# Patient Record
Sex: Female | Born: 1965 | Race: Black or African American | Hispanic: No | Marital: Single | Smoking: Former smoker
Health system: Southern US, Community
[De-identification: ages and names within clinical notes are randomized; demographics above are authoritative.]

## PROBLEM LIST (undated history)

## (undated) DIAGNOSIS — F329 Major depressive disorder, single episode, unspecified: Secondary | ICD-10-CM

## (undated) DIAGNOSIS — M1990 Unspecified osteoarthritis, unspecified site: Secondary | ICD-10-CM

## (undated) DIAGNOSIS — IMO0002 Reserved for concepts with insufficient information to code with codable children: Secondary | ICD-10-CM

## (undated) DIAGNOSIS — F429 Obsessive-compulsive disorder, unspecified: Secondary | ICD-10-CM

## (undated) DIAGNOSIS — R002 Palpitations: Secondary | ICD-10-CM

## (undated) DIAGNOSIS — M797 Fibromyalgia: Secondary | ICD-10-CM

## (undated) DIAGNOSIS — M329 Systemic lupus erythematosus, unspecified: Secondary | ICD-10-CM

## (undated) DIAGNOSIS — I10 Essential (primary) hypertension: Secondary | ICD-10-CM

## (undated) DIAGNOSIS — J189 Pneumonia, unspecified organism: Secondary | ICD-10-CM

## (undated) DIAGNOSIS — G35 Multiple sclerosis: Secondary | ICD-10-CM

## (undated) DIAGNOSIS — F32A Depression, unspecified: Secondary | ICD-10-CM

## (undated) DIAGNOSIS — G4733 Obstructive sleep apnea (adult) (pediatric): Secondary | ICD-10-CM

## (undated) HISTORY — PX: ABDOMINAL SURGERY: SHX537

## (undated) HISTORY — PX: JOINT REPLACEMENT: SHX530

## (undated) HISTORY — PX: CARPAL TUNNEL RELEASE: SHX101

## (undated) HISTORY — PX: HERNIA REPAIR: SHX51

## (undated) HISTORY — PX: BREAST EXCISIONAL BIOPSY: SUR124

---

## 2015-11-11 ENCOUNTER — Emergency Department (HOSPITAL_COMMUNITY): Payer: Medicare Other

## 2015-11-11 ENCOUNTER — Encounter (HOSPITAL_COMMUNITY): Payer: Self-pay | Admitting: Emergency Medicine

## 2015-11-11 ENCOUNTER — Emergency Department (HOSPITAL_COMMUNITY)
Admission: EM | Admit: 2015-11-11 | Discharge: 2015-11-11 | Discharge status: Against Medical Advice | Payer: Medicare Other | Attending: Emergency Medicine | Admitting: Emergency Medicine

## 2015-11-11 DIAGNOSIS — R079 Chest pain, unspecified: Secondary | ICD-10-CM | POA: Diagnosis present

## 2015-11-11 DIAGNOSIS — R29810 Facial weakness: Secondary | ICD-10-CM | POA: Insufficient documentation

## 2015-11-11 DIAGNOSIS — F17200 Nicotine dependence, unspecified, uncomplicated: Secondary | ICD-10-CM | POA: Diagnosis not present

## 2015-11-11 HISTORY — DX: Multiple sclerosis: G35

## 2015-11-11 HISTORY — DX: Unspecified osteoarthritis, unspecified site: M19.90

## 2015-11-11 HISTORY — DX: Fibromyalgia: M79.7

## 2015-11-11 LAB — BASIC METABOLIC PANEL
Anion gap: 8 (ref 5–15)
BUN: 12 mg/dL (ref 6–20)
CO2: 29 mmol/L (ref 22–32)
Calcium: 9 mg/dL (ref 8.9–10.3)
Chloride: 106 mmol/L (ref 101–111)
Creatinine, Ser: 0.85 mg/dL (ref 0.44–1.00)
GFR calc Af Amer: >60 mL/min (ref >60)
GFR calc non Af Amer: >60 mL/min (ref >60)
Glucose, Bld: 117 mg/dL — ABNORMAL HIGH (ref 65–99)
Potassium: 3.5 mmol/L (ref 3.5–5.1)
Sodium: 143 mmol/L (ref 135–145)

## 2015-11-11 LAB — CBC
HCT: 43.5 % (ref 36.0–46.0)
Hemoglobin: 14.3 g/dL (ref 12.0–15.0)
MCH: 29.7 pg (ref 26.0–34.0)
MCHC: 32.9 g/dL (ref 30.0–36.0)
MCV: 90.2 fL (ref 78.0–100.0)
Platelets: 216 10*3/uL (ref 150–400)
RBC: 4.82 MIL/uL (ref 3.87–5.11)
RDW: 14.2 % (ref 11.5–15.5)
WBC: 10.1 10*3/uL (ref 4.0–10.5)

## 2015-11-11 LAB — I-STAT TROPONIN, ED: Troponin i, poc: 0 ng/mL (ref 0.00–0.08)

## 2015-11-11 NOTE — ED Notes (Addendum)
Called twice no answer  MM

## 2015-11-11 NOTE — ED Notes (Signed)
Pt c/o chest pain, N/V, back pain, and L side facial numbness x 4 days. Pt A&Ox4. Pt has equal strength bilaterally in arms and legs. No facial droop noted. Face symmetrical. Pt walked into triage room. Pt c/o blurred vision in her L eye.

## 2015-11-11 NOTE — ED Notes (Signed)
Patient not answering in waiting room. 

## 2016-06-26 ENCOUNTER — Ambulatory Visit (HOSPITAL_BASED_OUTPATIENT_CLINIC_OR_DEPARTMENT_OTHER): Payer: Medicare Other | Attending: Cardiology | Admitting: Internal Medicine

## 2016-06-26 VITALS — Ht 61.0 in | Wt 266.0 lb

## 2016-06-26 DIAGNOSIS — G4719 Other hypersomnia: Secondary | ICD-10-CM | POA: Diagnosis not present

## 2016-06-26 DIAGNOSIS — G4730 Sleep apnea, unspecified: Secondary | ICD-10-CM

## 2016-06-26 DIAGNOSIS — R0683 Snoring: Secondary | ICD-10-CM | POA: Insufficient documentation

## 2016-06-26 DIAGNOSIS — G4726 Circadian rhythm sleep disorder, shift work type: Secondary | ICD-10-CM | POA: Insufficient documentation

## 2016-06-26 DIAGNOSIS — G4710 Hypersomnia, unspecified: Secondary | ICD-10-CM

## 2016-06-26 DIAGNOSIS — G4733 Obstructive sleep apnea (adult) (pediatric): Secondary | ICD-10-CM | POA: Diagnosis not present

## 2016-07-01 ENCOUNTER — Encounter: Payer: 59 | Admitting: Podiatry

## 2016-07-04 DIAGNOSIS — R0683 Snoring: Secondary | ICD-10-CM | POA: Diagnosis not present

## 2016-07-04 DIAGNOSIS — G4730 Sleep apnea, unspecified: Secondary | ICD-10-CM | POA: Diagnosis not present

## 2016-07-04 DIAGNOSIS — G4710 Hypersomnia, unspecified: Secondary | ICD-10-CM | POA: Diagnosis not present

## 2016-07-04 NOTE — Procedures (Signed)
   Patient Name: Megan Dixon, Megan Dixon Date: 06/26/2016 Gender: Female D.O.B: Jan 02, 1966 Age (years): 50 Referring Provider: Awanda Mink Spruill Height (inches): 61 Interpreting Physician: Baird Lyons MD, ABSM Weight (lbs): 266 RPSGT: Joni Reining BMI: 50 MRN: [id number] Neck Size: 15.50 CLINICAL INFORMATION Sleep Study Type: NPSG Indication for sleep study: Snoring Epworth Sleepiness Score: 4  SLEEP STUDY TECHNIQUE As per the AASM Manual for the Scoring of Sleep and Associated Events v2.3 (April 2016) with a hypopnea requiring 4% desaturations. The channels recorded and monitored were frontal, central and occipital EEG, electrooculogram (EOG), submentalis EMG (chin), nasal and oral airflow, thoracic and abdominal wall motion, anterior tibialis EMG, snore microphone, electrocardiogram, and pulse oximetry.  MEDICATIONS Patient's medications include: charted for review. Medications self-administered by patient during sleep study : No sleep medicine administered.  SLEEP ARCHITECTURE The study was initiated at 10:59:51 PM and ended at 5:14:50 AM. Sleep onset time was 15.2 minutes and the sleep efficiency was 88.9%. The total sleep time was 333.5 minutes. Stage REM latency was 84.0 minutes. The patient spent 2.10% of the night in stage N1 sleep, 65.82% in stage N2 sleep, 3.75% in stage N3 and 28.34% in REM. Alpha intrusion was absent. Supine sleep was 39.58%.  RESPIRATORY PARAMETERS The overall apnea/hypopnea index (AHI) was 36.2 per hour. There were 44 total apneas, including 20 obstructive, 24 central and 0 mixed apneas. There were 157 hypopneas and 8 RERAs. The AHI during Stage REM sleep was 68.6 per hour. AHI while supine was 31.4 per hour. The mean oxygen saturation was 93.85%. The minimum SpO2 during sleep was 81.00%. Moderate snoring was noted during this study.  CARDIAC DATA The 2 lead EKG demonstrated sinus rhythm. The mean heart rate was 84.06 beats per minute.  Other EKG findings include: None.  LEG MOVEMENT DATA The total PLMS were 0 with a resulting PLMS index of 0.00. Associated arousal with leg movement index was 0.0 .  IMPRESSIONS - Severe obstructive sleep apnea occurred during this study (AHI = 36.2/h). - No significant central sleep apnea occurred during this study (CAI = 4.3/h). - Mild oxygen desaturation was noted during this study (Min O2 = 81.00%). - The patient snored with Moderate snoring volume. - No cardiac abnormalities were noted during this study. - Clinically significant periodic limb movements did not occur during sleep. No significant associated arousals.  DIAGNOSIS - Obstructive Sleep Apnea (327.23 [G47.33 ICD-10]) - Nocturnal Hypoxemia (327.26 [G47.36 ICD-10])  RECOMMENDATIONS - Therapeutic CPAP titration to determine optimal pressure required to alleviate sleep disordered breathing. - Avoid alcohol, sedatives and other CNS depressants that may worsen sleep apnea and disrupt normal sleep architecture. - Sleep hygiene should be reviewed to assess factors that may improve sleep quality. - Weight management and regular exercise should be initiated or continued if appropriate.  [Electronically signed] 07/04/2016 11:49 AM  Baird Lyons MD, ABSM Diplomate, American Board of Sleep Medicine   NPI: [id number] [place], American Board of Sleep Medicine  ELECTRONICALLY SIGNED ON:  07/04/2016, 11:46 AM [place] PH: [phone]   FX: [phone] [place]

## 2016-07-20 ENCOUNTER — Ambulatory Visit: Payer: Medicaid Other | Admitting: Podiatry

## 2016-08-05 ENCOUNTER — Encounter (HOSPITAL_COMMUNITY): Payer: Self-pay | Admitting: Emergency Medicine

## 2016-08-05 ENCOUNTER — Emergency Department (HOSPITAL_COMMUNITY): Payer: No Typology Code available for payment source

## 2016-08-05 ENCOUNTER — Emergency Department (HOSPITAL_COMMUNITY)
Admission: EM | Admit: 2016-08-05 | Discharge: 2016-08-05 | Disposition: A | Discharge status: Home / Self Care | Payer: No Typology Code available for payment source | Attending: Emergency Medicine | Admitting: Emergency Medicine

## 2016-08-05 DIAGNOSIS — Y999 Unspecified external cause status: Secondary | ICD-10-CM | POA: Diagnosis not present

## 2016-08-05 DIAGNOSIS — F17200 Nicotine dependence, unspecified, uncomplicated: Secondary | ICD-10-CM | POA: Insufficient documentation

## 2016-08-05 DIAGNOSIS — Y92410 Unspecified street and highway as the place of occurrence of the external cause: Secondary | ICD-10-CM | POA: Diagnosis not present

## 2016-08-05 DIAGNOSIS — Y939 Activity, unspecified: Secondary | ICD-10-CM | POA: Diagnosis not present

## 2016-08-05 DIAGNOSIS — S42401A Unspecified fracture of lower end of right humerus, initial encounter for closed fracture: Secondary | ICD-10-CM

## 2016-08-05 DIAGNOSIS — S52044A Nondisplaced fracture of coronoid process of right ulna, initial encounter for closed fracture: Secondary | ICD-10-CM | POA: Diagnosis not present

## 2016-08-05 DIAGNOSIS — S59901A Unspecified injury of right elbow, initial encounter: Secondary | ICD-10-CM | POA: Diagnosis present

## 2016-08-05 MED ORDER — HYDROMORPHONE HCL 1 MG/ML IJ SOLN
1.0000 mg | Freq: Once | INTRAMUSCULAR | Status: AC
  Administered 2016-08-05: 1 mg via INTRAVENOUS
  Filled 2016-08-05: qty 1

## 2016-08-05 MED ORDER — OXYCODONE-ACETAMINOPHEN 5-325 MG PO TABS: 2.0000 | ORAL_TABLET | ORAL | 0 refills | Status: DC | PRN

## 2016-08-05 MED ORDER — OXYCODONE-ACETAMINOPHEN 5-325 MG PO TABS
2.0000 | ORAL_TABLET | Freq: Once | ORAL | Status: AC
  Administered 2016-08-05: 2 via ORAL
  Filled 2016-08-05: qty 2
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
## 2016-08-05 NOTE — Progress Notes (Signed)
Medicaid [place] access response hx indicates the assigned pcp is [facility], [place] [id number] [phone]
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
## 2016-08-05 NOTE — ED Notes (Signed)
Due to pt's hand swelling, ring had to be cut off.  Given to her children to hold on to.
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
## 2016-08-05 NOTE — ED Notes (Signed)
Patient transported to X-ray 

## 2016-08-05 NOTE — ED Notes (Signed)
Bed: [facility]PT1 Expected date:  Expected time:  Means of arrival:  Comments: 
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
## 2016-08-13 NOTE — Progress Notes (Signed)
This encounter was created in error - please disregard.

## 2016-10-25 ENCOUNTER — Encounter (HOSPITAL_COMMUNITY): Payer: Self-pay | Admitting: Emergency Medicine

## 2016-10-25 ENCOUNTER — Emergency Department (HOSPITAL_COMMUNITY)
Admission: EM | Admit: 2016-10-25 | Discharge: 2016-10-25 | Disposition: A | Discharge status: Home / Self Care | Payer: Medicare Other | Attending: Emergency Medicine | Admitting: Emergency Medicine

## 2016-10-25 DIAGNOSIS — M545 Low back pain: Secondary | ICD-10-CM | POA: Diagnosis present

## 2016-10-25 DIAGNOSIS — M5442 Lumbago with sciatica, left side: Secondary | ICD-10-CM | POA: Diagnosis not present

## 2016-10-25 DIAGNOSIS — F17200 Nicotine dependence, unspecified, uncomplicated: Secondary | ICD-10-CM | POA: Insufficient documentation

## 2016-10-25 DIAGNOSIS — G8929 Other chronic pain: Secondary | ICD-10-CM

## 2016-10-25 DIAGNOSIS — J45909 Unspecified asthma, uncomplicated: Secondary | ICD-10-CM | POA: Insufficient documentation

## 2016-10-25 DIAGNOSIS — M5441 Lumbago with sciatica, right side: Secondary | ICD-10-CM | POA: Insufficient documentation

## 2016-10-25 MED ORDER — CYCLOBENZAPRINE HCL 10 MG PO TABS: 10.0000 mg | ORAL_TABLET | Freq: Two times a day (BID) | ORAL | 0 refills | Status: DC | PRN

## 2016-10-25 MED ORDER — PREDNISONE 10 MG (21) PO TBPK: 10.0000 mg | ORAL_TABLET | Freq: Every day | ORAL | 0 refills | Status: DC

## 2016-10-25 MED ORDER — HYDROCODONE-ACETAMINOPHEN 5-325 MG PO TABS
1.0000 | ORAL_TABLET | Freq: Once | ORAL | Status: AC
  Administered 2016-10-25: 1 via ORAL
  Filled 2016-10-25: qty 1

## 2016-10-25 MED ORDER — METHOCARBAMOL 500 MG PO TABS
1000.0000 mg | ORAL_TABLET | Freq: Once | ORAL | Status: AC
  Administered 2016-10-25: 1000 mg via ORAL
  Filled 2016-10-25: qty 2

## 2016-10-25 MED ORDER — PREDNISONE 20 MG PO TABS
60.0000 mg | ORAL_TABLET | Freq: Once | ORAL | Status: AC
  Administered 2016-10-25: 60 mg via ORAL
  Filled 2016-10-25: qty 3

## 2016-10-25 MED ORDER — NAPROXEN 500 MG PO TABS: 500.0000 mg | ORAL_TABLET | Freq: Two times a day (BID) | ORAL | 0 refills | Status: DC

## 2016-10-25 MED ORDER — LIDOCAINE 5 % EX PTCH: 1.0000 | MEDICATED_PATCH | CUTANEOUS | 0 refills | Status: DC

## 2016-10-25 MED ORDER — HYDROCODONE-ACETAMINOPHEN 5-325 MG PO TABS: 1.0000 | ORAL_TABLET | Freq: Four times a day (QID) | ORAL | 0 refills | Status: DC | PRN

## 2016-10-25 NOTE — Discharge Instructions (Signed)
Take it easy, but do not lay around too much as this may make the stiffness worse. Take 500 mg of naproxen every 12 hours or 800 mg of ibuprofen every 8 hours for the next 3 days. Take these medications with food to avoid upset stomach. Flexeril is a muscle relaxer and may help loosen stiff muscles. Do not take the Flexeril while driving or performing other dangerous activities. Be sure to perform the attached exercises starting with three times a week and working up to performing them daily. This is an essential part of preventing long term problems. Follow up with a primary care provider or your neurologist for any future management of these complaints.

## 2016-10-25 NOTE — ED Triage Notes (Signed)
Patient here from home with complaints of chronic low back pain. Hx of MS and fibromyalgia. Pain 10/10. Ambulatory. Gabapentin and ibuprofen with no relief.

## 2016-10-25 NOTE — ED Notes (Signed)
Signature pad not working. 

## 2016-10-25 NOTE — ED Provider Notes (Signed)
[facility] DEPT Provider Note   CSN: [id number] Arrival date & time: 10/25/16  [id number]     History   Chief Complaint Chief Complaint  Patient presents with  . Back Pain    HPI Megan Dixon is a 50 y.o. female.  HPI   Megan Dixon is a 50 y.o. female, with a history of Fibromyalgia, arthritis, and MS, presenting to the ED with acute on chronic lower back pain for the past three days. Pain is cramping/tight, severe, radiating down her legs bilaterally. Pt has taken ibuprofen without complete relief. Percocet and Dilaudid has previously helped patient with her pain. Denies neuro deficits, changes in bowel or bladder function, fever/chills, N/V, or any other complaints.     Patient has been seen previously by [facility] neurology.   Past Medical History:  Diagnosis Date  . Arthritis   . Fibromyalgia   . MS (multiple sclerosis) ([place])     There are no active problems to display for this patient.   Past Surgical History:  Procedure Laterality Date  . ABDOMINAL SURGERY    . JOINT REPLACEMENT      OB History    No data available       Home Medications    Prior to Admission medications   Medication Sig Start Date End Date Taking? Authorizing Provider  amLODipine-valsartan (EXFORGE) 5-320 MG tablet Take 1 tablet by mouth daily.   Yes Historical Provider, MD  FLUoxetine (PROZAC) 20 MG capsule Take 60 mg by mouth daily.   Yes Historical Provider, MD  gabapentin (NEURONTIN) 600 MG tablet Take 600 mg by mouth 2 (two) times daily.   Yes Historical Provider, MD  ibuprofen (ADVIL,MOTRIN) 200 MG tablet Take 400 mg by mouth every 6 (six) hours as needed for moderate pain.   Yes Historical Provider, MD  lidocaine (XYLOCAINE) 5 % ointment Apply 1 application topically 2 (two) times daily as needed for mild pain or moderate pain.   Yes Historical Provider, MD  pantoprazole (PROTONIX) 40 MG tablet Take 40 mg by mouth daily.   Yes Historical Provider, MD  cyclobenzaprine  (FLEXERIL) 10 MG tablet Take 1 tablet (10 mg total) by mouth 2 (two) times daily as needed for muscle spasms. 10/25/16   Canton Yearby C Paulla Mcclaskey, PA-C  HYDROcodone-acetaminophen (NORCO/VICODIN) 5-325 MG tablet Take 1-2 tablets by mouth every 6 (six) hours as needed. 10/25/16   Laurene Melendrez C Ayrton Mcvay, PA-C  lidocaine (LIDODERM) 5 % Place 1 patch onto the skin daily. Remove & Discard patch within 12 hours or as directed by MD 10/25/16   Helane Gunther Ethaniel Garfield, PA-C  naproxen (NAPROSYN) 500 MG tablet Take 1 tablet (500 mg total) by mouth 2 (two) times daily. 10/25/16   Bailey Kolbe C Rachel Rison, PA-C  oxyCODONE-acetaminophen (PERCOCET) 5-325 MG tablet Take 2 tablets by mouth every 4 (four) hours as needed. Patient not taking: Reported on 10/25/2016 08/05/16   Charlesetta Shanks, MD  predniSONE (STERAPRED UNI-PAK 21 TAB) 10 MG (21) TBPK tablet Take 1 tablet (10 mg total) by mouth daily. Take 6 tabs day 1, 5 tabs day 2, 4 tabs day 3, 3 tabs day 4, 2 tabs day 5, and 1 tab on day 6. 10/25/16   Darlys Buis C Monty Spicher, PA-C    Family History No family history on file.  Social History Social History  Substance Use Topics  . Smoking status: Current Every Day Smoker  . Smokeless tobacco: Never Used  . Alcohol use Yes     Allergies   Morphine and related and Toradol [  ketorolac tromethamine]   Review of Systems Review of Systems  Constitutional: Negative for chills and fever.  Respiratory: Negative for shortness of breath.   Cardiovascular: Negative for chest pain.  Gastrointestinal: Negative for abdominal pain, nausea and vomiting.  Musculoskeletal: Positive for back pain. Negative for neck pain.  Neurological: Negative for dizziness, weakness, light-headedness, numbness and headaches.  All other systems reviewed and are negative.    Physical Exam Updated Vital Signs BP 161/99 (BP Location: Right Arm)   Pulse 98   Temp 97.8 F (36.6 C) (Oral)   Resp 18   SpO2 99%   Physical Exam  Constitutional: She appears well-developed and well-nourished. No  distress.  HENT:  Head: Normocephalic and atraumatic.  Eyes: Conjunctivae and EOM are normal. Pupils are equal, round, and reactive to light.  Neck: Normal range of motion. Neck supple.  Cardiovascular: Normal rate, regular rhythm, normal heart sounds and intact distal pulses.   Pulmonary/Chest: Effort normal and breath sounds normal. No respiratory distress.  Abdominal: Soft. There is no tenderness. There is no guarding.  Musculoskeletal: She exhibits tenderness. She exhibits no edema.  Tenderness to the bilateral lumbar musculature extending into the bilateral gluteal regions. Normal motor function intact in all extremities and spine. No midline spinal tenderness.   Lymphadenopathy:    She has no cervical adenopathy.  Neurological: She is alert.  No sensory deficits. Strength 5/5 in all extremities. No gait disturbance. Coordination intact. Cranial nerves III-XII grossly intact.   Skin: Skin is warm and dry. Capillary refill takes less than 2 seconds. She is not diaphoretic.  Psychiatric: She has a normal mood and affect. Her behavior is normal.  Nursing note and vitals reviewed.    ED Treatments / Results  Labs (all labs ordered are listed, but only abnormal results are displayed) Labs Reviewed - No data to display  EKG  EKG Interpretation None       Radiology No results found.  Procedures Procedures (including critical care time)  Medications Ordered in ED Medications  HYDROcodone-acetaminophen (NORCO/VICODIN) 5-325 MG per tablet 1 tablet (1 tablet Oral Given 10/25/16 0841)  methocarbamol (ROBAXIN) tablet 1,000 mg (1,000 mg Oral Given 10/25/16 0841)  predniSONE (DELTASONE) tablet 60 mg (60 mg Oral Given 10/25/16 0841)     Initial Impression / Assessment and Plan / ED Course  I have reviewed the triage vital signs and the nursing notes.  Pertinent labs & imaging results that were available during my care of the patient were reviewed by me and considered in my  medical decision making (see chart for details).  Clinical Course     Patient presents with acute on chronic lower back pain. No neuro or functional deficits. No red flag symptoms. No indication for emergent imaging at this time. PCP versus neurology follow-up. The patient was given instructions for home care as well as return precautions. Patient voices understanding of these instructions, accepts the plan, and is comfortable with discharge.   Final Clinical Impressions(s) / ED Diagnoses   Final diagnoses:  Chronic bilateral low back pain with bilateral sciatica    New Prescriptions Discharge Medication List as of 10/25/2016  9:09 AM    START taking these medications   Details  cyclobenzaprine (FLEXERIL) 10 MG tablet Take 1 tablet (10 mg total) by mouth 2 (two) times daily as needed for muscle spasms., Starting Sun 10/25/2016, Print    HYDROcodone-acetaminophen (NORCO/VICODIN) 5-325 MG tablet Take 1-2 tablets by mouth every 6 (six) hours as needed., Starting Sun 10/25/2016, Print  lidocaine (LIDODERM) 5 % Place 1 patch onto the skin daily. Remove & Discard patch within 12 hours or as directed by MD, Starting Sun 10/25/2016, Print    naproxen (NAPROSYN) 500 MG tablet Take 1 tablet (500 mg total) by mouth 2 (two) times daily., Starting Sun 10/25/2016, Print    predniSONE (STERAPRED UNI-PAK 21 TAB) 10 MG (21) TBPK tablet Take 1 tablet (10 mg total) by mouth daily. Take 6 tabs day 1, 5 tabs day 2, 4 tabs day 3, 3 tabs day 4, 2 tabs day 5, and 1 tab on day 6., Starting Sun 10/25/2016, Print         Lorayne Bender, PA-C 10/25/16 1636    Tanna Furry, MD 11/11/16 1038

## 2017-02-04 ENCOUNTER — Emergency Department (HOSPITAL_COMMUNITY)
Admission: EM | Admit: 2017-02-04 | Discharge: 2017-02-04 | Disposition: A | Discharge status: Home / Self Care | Payer: Medicare Other | Attending: Emergency Medicine | Admitting: Emergency Medicine

## 2017-02-04 ENCOUNTER — Encounter (HOSPITAL_COMMUNITY): Payer: Self-pay | Admitting: Emergency Medicine

## 2017-02-04 ENCOUNTER — Emergency Department (HOSPITAL_COMMUNITY): Payer: Medicare Other

## 2017-02-04 DIAGNOSIS — K5641 Fecal impaction: Secondary | ICD-10-CM

## 2017-02-04 DIAGNOSIS — F17200 Nicotine dependence, unspecified, uncomplicated: Secondary | ICD-10-CM | POA: Insufficient documentation

## 2017-02-04 DIAGNOSIS — R109 Unspecified abdominal pain: Secondary | ICD-10-CM | POA: Diagnosis not present

## 2017-02-04 DIAGNOSIS — K5900 Constipation, unspecified: Secondary | ICD-10-CM | POA: Diagnosis not present

## 2017-02-04 DIAGNOSIS — R1011 Right upper quadrant pain: Secondary | ICD-10-CM

## 2017-02-04 DIAGNOSIS — R1084 Generalized abdominal pain: Secondary | ICD-10-CM | POA: Diagnosis present

## 2017-02-04 LAB — COMPREHENSIVE METABOLIC PANEL
ALT: 16 U/L (ref 14–54)
AST: 17 U/L (ref 15–41)
Albumin: 3.9 g/dL (ref 3.5–5.0)
Alkaline Phosphatase: 98 U/L (ref 38–126)
Anion gap: 9 (ref 5–15)
BUN: 14 mg/dL (ref 6–20)
CO2: 27 mmol/L (ref 22–32)
Calcium: 9.1 mg/dL (ref 8.9–10.3)
Chloride: 105 mmol/L (ref 101–111)
Creatinine, Ser: 0.8 mg/dL (ref 0.44–1.00)
GFR calc Af Amer: >60 mL/min (ref >60)
GFR calc non Af Amer: >60 mL/min (ref >60)
Glucose, Bld: 98 mg/dL (ref 65–99)
Potassium: 3.7 mmol/L (ref 3.5–5.1)
Sodium: 141 mmol/L (ref 135–145)
Total Bilirubin: 0.2 mg/dL — ABNORMAL LOW (ref 0.3–1.2)
Total Protein: 7.5 g/dL (ref 6.5–8.1)

## 2017-02-04 LAB — PREGNANCY, URINE: Preg Test, Ur: NEGATIVE

## 2017-02-04 LAB — CBC
HCT: 40.5 % (ref 36.0–46.0)
Hemoglobin: 13.5 g/dL (ref 12.0–15.0)
MCH: 29 pg (ref 26.0–34.0)
MCHC: 33.3 g/dL (ref 30.0–36.0)
MCV: 87.1 fL (ref 78.0–100.0)
Platelets: 229 10*3/uL (ref 150–400)
RBC: 4.65 MIL/uL (ref 3.87–5.11)
RDW: 14.7 % (ref 11.5–15.5)
WBC: 8.9 10*3/uL (ref 4.0–10.5)

## 2017-02-04 LAB — URINALYSIS, ROUTINE W REFLEX MICROSCOPIC
Bilirubin Urine: NEGATIVE
Glucose, UA: NEGATIVE mg/dL
Hgb urine dipstick: NEGATIVE
Ketones, ur: NEGATIVE mg/dL
Leukocytes, UA: NEGATIVE
Nitrite: NEGATIVE
Protein, ur: NEGATIVE mg/dL
Specific Gravity, Urine: 1.013 (ref 1.005–1.030)
pH: 7 (ref 5.0–8.0)

## 2017-02-04 LAB — LIPASE, BLOOD: Lipase: 24 U/L (ref 11–51)

## 2017-02-04 MED ORDER — GI COCKTAIL ~~LOC~~
30.0000 mL | Freq: Once | ORAL | Status: AC
  Administered 2017-02-04: 30 mL via ORAL
  Filled 2017-02-04: qty 30

## 2017-02-04 MED ORDER — POLYETHYLENE GLYCOL 3350 17 G PO PACK: PACK | ORAL | 0 refills | Status: DC

## 2017-02-04 MED ORDER — ONDANSETRON HCL 4 MG/2ML IJ SOLN
4.0000 mg | Freq: Once | INTRAMUSCULAR | Status: AC | PRN
  Administered 2017-02-04: 4 mg via INTRAVENOUS
  Filled 2017-02-04: qty 2

## 2017-02-04 MED ORDER — DICYCLOMINE HCL 20 MG PO TABS: 20.0000 mg | ORAL_TABLET | Freq: Three times a day (TID) | ORAL | 0 refills | Status: DC

## 2017-02-04 MED ORDER — ONDANSETRON HCL 4 MG/2ML IJ SOLN
4.0000 mg | Freq: Once | INTRAMUSCULAR | Status: AC
  Administered 2017-02-04: 4 mg via INTRAVENOUS
  Filled 2017-02-04: qty 2

## 2017-02-04 MED ORDER — HYDROCODONE-ACETAMINOPHEN 5-325 MG PO TABS
2.0000 | ORAL_TABLET | Freq: Once | ORAL | Status: AC
  Administered 2017-02-04: 2 via ORAL
  Filled 2017-02-04: qty 2

## 2017-02-04 MED ORDER — SODIUM CHLORIDE 0.9 % IJ SOLN
INTRAMUSCULAR | Status: AC
  Filled 2017-02-04: qty 50

## 2017-02-04 MED ORDER — HYDROMORPHONE HCL 1 MG/ML IJ SOLN
1.0000 mg | Freq: Once | INTRAMUSCULAR | Status: AC
  Administered 2017-02-04: 1 mg via INTRAVENOUS
  Filled 2017-02-04: qty 1

## 2017-02-04 MED ORDER — IOPAMIDOL (ISOVUE-300) INJECTION 61%
INTRAVENOUS | Status: AC
  Administered 2017-02-04: 100 mL
  Filled 2017-02-04: qty 100

## 2017-02-04 MED ORDER — ONDANSETRON HCL 4 MG PO TABS: 4.0000 mg | ORAL_TABLET | Freq: Three times a day (TID) | ORAL | 0 refills | Status: DC | PRN

## 2017-02-04 MED ORDER — SODIUM CHLORIDE 0.9 % IV BOLUS (SEPSIS)
1000.0000 mL | Freq: Once | INTRAVENOUS | Status: AC
  Administered 2017-02-04: 1000 mL via INTRAVENOUS

## 2017-02-04 NOTE — ED Provider Notes (Signed)
[facility] DEPT Provider Note   CSN: [id number] Arrival date & time: 02/04/17  1214     History   Chief Complaint Chief Complaint  Patient presents with  . Abdominal Pain  . Emesis    HPI Megan Dixon is a 51 y.o. female.  HPI   51 year old female with past medical history as above who presents with generalized abdominal pain. The patient's symptoms started approximately a week ago with right upper quadrant pain. Since then, she has had cramping, aching, right upper quadrant pain. The pain is worse with eating. She has had some small bowel movements but not her usual amount over the same time period. No fever or chills. She has no history of similar symptoms. Denies any alleviating factors.  Past Medical History:  Diagnosis Date  . Arthritis   . Fibromyalgia   . MS (multiple sclerosis) ([place])     There are no active problems to display for this patient.   Past Surgical History:  Procedure Laterality Date  . ABDOMINAL SURGERY    . HERNIA REPAIR    . JOINT REPLACEMENT      OB History    No data available       Home Medications    Prior to Admission medications   Medication Sig Start Date End Date Taking? Authorizing Provider  amLODipine-valsartan (EXFORGE) 5-320 MG tablet Take 1 tablet by mouth daily.   Yes Historical Provider, MD  FLUoxetine (PROZAC) 20 MG capsule Take 60 mg by mouth daily.   Yes Historical Provider, MD  gabapentin (NEURONTIN) 600 MG tablet Take 600 mg by mouth 2 (two) times daily.   Yes Historical Provider, MD  pantoprazole (PROTONIX) 40 MG tablet Take 40 mg by mouth daily.   Yes Historical Provider, MD  dicyclomine (BENTYL) 20 MG tablet Take 1 tablet (20 mg total) by mouth 4 (four) times daily -  before meals and at bedtime. 02/04/17 02/14/17  Duffy Bruce, MD  ondansetron (ZOFRAN) 4 MG tablet Take 1 tablet (4 mg total) by mouth every 8 (eight) hours as needed for nausea or vomiting. 02/04/17   Duffy Bruce, MD  polyethylene glycol  [facility]) packet Mix 8 cap fulls in 32 ounces of juice/water/gatorade and drink over the course of a day. Once your stool is loose or liquid, stop taking. 02/04/17   Duffy Bruce, MD    Family History No family history on file.  Social History Social History  Substance Use Topics  . Smoking status: Current Every Day Smoker  . Smokeless tobacco: Never Used  . Alcohol use Yes     Allergies   Morphine and related and Toradol [ketorolac tromethamine]   Review of Systems Review of Systems  Constitutional: Positive for fatigue.  Gastrointestinal: Positive for abdominal pain, constipation, nausea and vomiting.  Neurological: Positive for weakness (generalized).  All other systems reviewed and are negative.    Physical Exam Updated Vital Signs BP 158/96 (BP Location: Left Arm)   Pulse 74   Temp 98.3 F (36.8 C) (Oral)   Resp 18   Ht 5\' 1"  (1.549 m)   Wt 240 lb (108.9 kg)   SpO2 94%   BMI 45.35 kg/m   Physical Exam  Constitutional: She is oriented to person, place, and time. She appears well-developed and well-nourished. No distress.  HENT:  Head: Normocephalic and atraumatic.  Eyes: Conjunctivae are normal.  Neck: Neck supple.  Cardiovascular: Normal rate, regular rhythm and normal heart sounds.  Exam reveals no friction rub.   No murmur  heard. Pulmonary/Chest: Effort normal and breath sounds normal. No respiratory distress. She has no wheezes. She has no rales.  Abdominal: Normal appearance. She exhibits no distension. There is tenderness in the right upper quadrant and epigastric area. There is no rigidity, no rebound, no guarding and negative Murphy's sign.  Musculoskeletal: She exhibits no edema.  Neurological: She is alert and oriented to person, place, and time. She exhibits normal muscle tone.  Skin: Skin is warm. Capillary refill takes less than 2 seconds.  Psychiatric: She has a normal mood and affect.  Nursing note and vitals reviewed.    ED Treatments /  Results  Labs (all labs ordered are listed, but only abnormal results are displayed) Labs Reviewed  COMPREHENSIVE METABOLIC PANEL - Abnormal; Notable for the following:       Result Value   Total Bilirubin 0.2 (*)    All other components within normal limits  LIPASE, BLOOD  CBC  URINALYSIS, ROUTINE W REFLEX MICROSCOPIC  PREGNANCY, URINE    EKG  EKG Interpretation None       Radiology Ct Abdomen Pelvis W Contrast  Result Date: 02/04/2017 CLINICAL DATA:  Right abdominal pain for 2 weeks, history of diverticulitis and hernia repair EXAM: CT ABDOMEN AND PELVIS WITH CONTRAST TECHNIQUE: Multidetector CT imaging of the abdomen and pelvis was performed using the standard protocol following bolus administration of intravenous contrast. CONTRAST:  15mL ISOVUE-300 IOPAMIDOL (ISOVUE-300) INJECTION 61% COMPARISON:  None. FINDINGS: Lower chest: Lung bases are unremarkable.  Small hiatal hernia. Hepatobiliary: Enhanced liver shows no focal mass. No calcified gallstones are noted within gallbladder. Pancreas: Enhanced pancreas without focal abnormality. No peripancreatic inflammation. Spleen: Enhanced spleen with normal appearance. Small accessory splenule is noted. Adrenals/Urinary Tract: There is probable adenoma left adrenal gland measures 1.2 cm. Right adrenal gland is unremarkable. Enhanced kidneys are symmetrical in size. No hydronephrosis or hydroureter. There is a tiny cyst in midpole of the left kidney posterior aspect measures 8 mm. Delayed renal images shows bilateral renal symmetrical excretion. Bilateral visualized proximal ureter is unremarkable. Stomach/Bowel: There is no small bowel obstruction. No gastric outlet obstruction. Suboptimal exam without oral contrast material. No thickened or dilated small bowel loops are noted. Abundant stool noted within cecum. No pericecal inflammation. The terminal ileum is unremarkable. There is normal appendix clearly visualized in coronal image 80.  Moderate stool are noted within mild redundant transverse colon. Scattered diverticula are noted descending colon. No evidence of acute diverticulitis. Multiple colonic diverticula are noted within proximal sigmoid colon. No evidence of acute diverticulitis. The sigmoid colon is redundant looping in right lower quadrant adjacent to the cecum. There is mild focal dilatation of the sigmoid colon adjacent to the cecum up to 4 cm with fecal material axial image 56. Mild fecal impaction cannot be excluded. Beyond this point the distal sigmoid colon small caliber and rectum is collapsed. Vascular/Lymphatic: No aortic aneurysm. Atherosclerotic calcifications of abdominal aorta and iliac arteries are noted. No adenopathy. Reproductive: The uterus is anteflexed normal size. No adnexal mass. The ovaries are unremarkable. Other: There is a midline upper abdominal wall small ventral hernia just anterior to left hepatic lobe containing omental fat measures 3.2 by 1.7 cm. No evidence of acute complication. This is best visualized in sagittal image 121. There is some abdominal wall scarring just inferior to this hernia. There is a second midline anterior abdominal wall supraumbilical hernia containing omental fat best seen in sagittal image 115. Measures at least 9.5 cm cranial caudally. Measures 8 cm transverse by  mentioned by 5 cm AP diameter. No definite evidence of acute inflammation however there is some stranding of the anterior omental fat in anterior abdomen/omentum axial image 39. Mild inflammation/omental edema cannot be excluded. Musculoskeletal: No destructive bony lesions are noted. Sagittal images of the spine shows degenerative changes thoracic spine. Next item there is a tiny umbilical hernia containing omental fat without evidence of acute complication. IMPRESSION: 1. There is no evidence of small bowel obstruction. 2. No hydronephrosis or hydroureter. 3. Moderate stool are noted within right colon and cecum. No  pericecal inflammation. Terminal ileum is unremarkable. Normal appendix clearly visualize in axial image 48. 4. Colonic diverticula are noted in descending colon and proximal sigmoid colon. No definite evidence of acute colitis or diverticulitis.The sigmoid colon is redundant looping in right lower quadrant adjacent to the cecum. There is mild focal dilatation of the sigmoid colon adjacent to the cecum up to 4 cm with fecal material axial image 56. Mild fecal impaction cannot be excluded. Beyond this point the distal sigmoid colon small caliber and rectum is collapsed. 5. There is a midline upper anterior abdominal wall ventral hernia measures 3.2 x 1.7 cm containing omental fat without evidence of acute complication. There is a second supraumbilical hernia containing omental fat midline anterior abdominal wall measures 9.5 cm cranial caudally by 8 cm x 5 cm. No definite evidence of acute inflammation within hernia. However there is mild stranding of the anterior omental fat at the base of the hernia anterior abdomen. Mild inflammation/edema cannot be excluded. Clinical correlation is necessary please see axial image 39. 6. Anteflexed normal size uterus.  No adnexal mass. 7. Degenerative changes thoracic spine. Electronically Signed   By: Lahoma Crocker M.D.   On: 02/04/2017 15:36   US Abdomen Limited Ruq  Result Date: 02/04/2017 CLINICAL DATA:  One week of right upper quadrant pain associated with nausea and vomiting. EXAM: US ABDOMEN LIMITED - RIGHT UPPER QUADRANT COMPARISON:  None in PACs FINDINGS: Gallbladder: No gallstones or wall thickening visualized. No sonographic Murphy sign noted by sonographer. Common bile duct: Diameter: 3.3 mm Liver: No focal lesion identified. Within normal limits in parenchymal echogenicity. IMPRESSION: No gallstones or sonographic evidence of acute cholecystitis. If there are clinical concerns of chronic cholecystitis, a nuclear medicine hepatobiliary scan with gallbladder ejection  fraction determination may be useful. Normal appearance of the liver and common bile duct. Electronically Signed   By: David  Martinique M.D.   On: 02/04/2017 14:18    Procedures Procedures (including critical care time)  Medications Ordered in ED Medications  sodium chloride 0.9 % injection (not administered)  ondansetron (ZOFRAN) injection 4 mg (4 mg Intravenous Given 02/04/17 1641)  HYDROmorphone (DILAUDID) injection 1 mg (1 mg Intravenous Given 02/04/17 1348)  ondansetron (ZOFRAN) injection 4 mg (4 mg Intravenous Given 02/04/17 1348)  sodium chloride 0.9 % bolus 1,000 mL (0 mLs Intravenous Stopped 02/04/17 1515)  gi cocktail (Maalox,Lidocaine,Donnatal) (30 mLs Oral Given 02/04/17 1514)  iopamidol (ISOVUE-300) 61 % injection (100 mLs  Contrast Given 02/04/17 1500)  HYDROcodone-acetaminophen (NORCO/VICODIN) 5-325 MG per tablet 2 tablet (2 tablets Oral Given 02/04/17 1642)     Initial Impression / Assessment and Plan / ED Course  I have reviewed the triage vital signs and the nursing notes.  Pertinent labs & imaging results that were available during my care of the patient were reviewed by me and considered in my medical decision making (see chart for details).     51 year old female with past medical history as  above who presents with right upper quadrant pain. On arrival, vital signs are stable. Right upper quadrant ultrasound shows no evidence of gallbladder disease and labs are unremarkable. She has normal LFTs as well as bilirubin. Given her undifferentiated pain, CT scan obtained and does show mild fecal impaction and constipation, worse in the right upper quadrant, which correlates with her symptoms. She is starting by mouth without signs of obstruction. Will place the patient on an aggressive bowel regimen, give anti-spasmodic, and discharge home. Tolerating PO, VSS. Return precautions given.  Final Clinical Impressions(s) / ED Diagnoses   Final diagnoses:  RUQ pain  Constipation,  unspecified constipation type  Fecal impaction [facility])    New Prescriptions Discharge Medication List as of 02/04/2017  4:31 PM    START taking these medications   Details  dicyclomine (BENTYL) 20 MG tablet Take 1 tablet (20 mg total) by mouth 4 (four) times daily -  before meals and at bedtime., Starting Thu 02/04/2017, Until Sun 02/14/2017, Print    ondansetron (ZOFRAN) 4 MG tablet Take 1 tablet (4 mg total) by mouth every 8 (eight) hours as needed for nausea or vomiting., Starting Thu 02/04/2017, Print    polyethylene glycol (MIRALAX) packet Mix 8 cap fulls in 32 ounces of juice/water/gatorade and drink over the course of a day. Once your stool is loose or liquid, stop taking., Print         Duffy Bruce, MD 02/04/17 [phone]

## 2017-02-04 NOTE — ED Notes (Signed)
Patient is alert and oriented x3.  She was given DC instructions and follow up visit instructions.  Patient gave verbal understanding. She was DC ambulatory under her own power to home.  V/S stable.  He was not showing any signs of distress on DC 

## 2017-02-04 NOTE — ED Notes (Signed)
Pt ambulated to restroom without difficulty

## 2017-02-04 NOTE — ED Notes (Signed)
Pt O2 saturation dropped to 84% following dilaudid admin. Pt placed on 1L O2. Saturation 95%. Ultrasound at bedside.

## 2017-02-04 NOTE — ED Triage Notes (Signed)
Patient c/o RUQ pain with vomiting that started today. Patient reports that pains radiate from right side to left side. Patient denies any diarrhea. Patient last BM today that was normal.

## 2017-02-08 DIAGNOSIS — R300 Dysuria: Secondary | ICD-10-CM | POA: Diagnosis not present

## 2017-08-06 DIAGNOSIS — I10 Essential (primary) hypertension: Secondary | ICD-10-CM | POA: Diagnosis not present

## 2017-08-06 DIAGNOSIS — M545 Low back pain: Secondary | ICD-10-CM | POA: Diagnosis not present

## 2017-08-06 DIAGNOSIS — M797 Fibromyalgia: Secondary | ICD-10-CM | POA: Diagnosis not present

## 2017-08-06 DIAGNOSIS — K219 Gastro-esophageal reflux disease without esophagitis: Secondary | ICD-10-CM | POA: Diagnosis not present

## 2017-08-06 DIAGNOSIS — F321 Major depressive disorder, single episode, moderate: Secondary | ICD-10-CM | POA: Diagnosis not present

## 2017-08-06 DIAGNOSIS — K432 Incisional hernia without obstruction or gangrene: Secondary | ICD-10-CM | POA: Diagnosis not present

## 2017-08-06 DIAGNOSIS — M6530 Trigger finger, unspecified finger: Secondary | ICD-10-CM | POA: Diagnosis not present

## 2017-08-06 DIAGNOSIS — Z86010 Personal history of colonic polyps: Secondary | ICD-10-CM | POA: Diagnosis not present

## 2017-08-09 ENCOUNTER — Encounter (HOSPITAL_COMMUNITY): Admission: EM | Disposition: A | Discharge status: Home Health | Payer: Self-pay | Source: Home / Self Care

## 2017-08-09 ENCOUNTER — Emergency Department (HOSPITAL_COMMUNITY): Payer: Medicare Other | Admitting: Anesthesiology

## 2017-08-09 ENCOUNTER — Inpatient Hospital Stay (HOSPITAL_COMMUNITY)
Admission: EM | Admit: 2017-08-09 | Discharge: 2017-08-13 | DRG: 354 | Disposition: A | Discharge status: Home Health | Payer: Medicare Other | Attending: General Surgery | Admitting: General Surgery

## 2017-08-09 ENCOUNTER — Emergency Department (HOSPITAL_COMMUNITY): Payer: Medicare Other

## 2017-08-09 ENCOUNTER — Encounter (HOSPITAL_COMMUNITY): Payer: Self-pay | Admitting: Emergency Medicine

## 2017-08-09 DIAGNOSIS — M1990 Unspecified osteoarthritis, unspecified site: Secondary | ICD-10-CM | POA: Diagnosis present

## 2017-08-09 DIAGNOSIS — R109 Unspecified abdominal pain: Secondary | ICD-10-CM | POA: Diagnosis not present

## 2017-08-09 DIAGNOSIS — Z885 Allergy status to narcotic agent status: Secondary | ICD-10-CM

## 2017-08-09 DIAGNOSIS — F17200 Nicotine dependence, unspecified, uncomplicated: Secondary | ICD-10-CM | POA: Diagnosis present

## 2017-08-09 DIAGNOSIS — K219 Gastro-esophageal reflux disease without esophagitis: Secondary | ICD-10-CM | POA: Diagnosis not present

## 2017-08-09 DIAGNOSIS — Z6841 Body Mass Index (BMI) 40.0 and over, adult: Secondary | ICD-10-CM

## 2017-08-09 DIAGNOSIS — Z79899 Other long term (current) drug therapy: Secondary | ICD-10-CM

## 2017-08-09 DIAGNOSIS — R1013 Epigastric pain: Secondary | ICD-10-CM | POA: Diagnosis not present

## 2017-08-09 DIAGNOSIS — I10 Essential (primary) hypertension: Secondary | ICD-10-CM | POA: Diagnosis present

## 2017-08-09 DIAGNOSIS — Z886 Allergy status to analgesic agent status: Secondary | ICD-10-CM

## 2017-08-09 DIAGNOSIS — E669 Obesity, unspecified: Secondary | ICD-10-CM | POA: Diagnosis present

## 2017-08-09 DIAGNOSIS — Z91010 Allergy to peanuts: Secondary | ICD-10-CM

## 2017-08-09 DIAGNOSIS — K430 Incisional hernia with obstruction, without gangrene: Principal | ICD-10-CM | POA: Diagnosis present

## 2017-08-09 DIAGNOSIS — G35 Multiple sclerosis: Secondary | ICD-10-CM | POA: Diagnosis present

## 2017-08-09 DIAGNOSIS — M797 Fibromyalgia: Secondary | ICD-10-CM | POA: Diagnosis not present

## 2017-08-09 DIAGNOSIS — K436 Other and unspecified ventral hernia with obstruction, without gangrene: Secondary | ICD-10-CM | POA: Diagnosis not present

## 2017-08-09 DIAGNOSIS — R9431 Abnormal electrocardiogram [ECG] [EKG]: Secondary | ICD-10-CM | POA: Diagnosis not present

## 2017-08-09 DIAGNOSIS — Z9889 Other specified postprocedural states: Secondary | ICD-10-CM

## 2017-08-09 HISTORY — PX: VENTRAL HERNIA REPAIR: SHX424

## 2017-08-09 LAB — URINALYSIS, ROUTINE W REFLEX MICROSCOPIC
Bacteria, UA: NONE SEEN
Bilirubin Urine: NEGATIVE
Glucose, UA: NEGATIVE mg/dL
Hgb urine dipstick: NEGATIVE
Ketones, ur: NEGATIVE mg/dL
Leukocytes, UA: NEGATIVE
Nitrite: NEGATIVE
Protein, ur: 30 mg/dL — AB
Specific Gravity, Urine: 1.011 (ref 1.005–1.030)
pH: 8 (ref 5.0–8.0)

## 2017-08-09 LAB — COMPREHENSIVE METABOLIC PANEL
ALT: 15 U/L (ref 14–54)
AST: 27 U/L (ref 15–41)
Albumin: 4 g/dL (ref 3.5–5.0)
Alkaline Phosphatase: 108 U/L (ref 38–126)
Anion gap: 12 (ref 5–15)
BUN: 11 mg/dL (ref 6–20)
CO2: 26 mmol/L (ref 22–32)
Calcium: 9.4 mg/dL (ref 8.9–10.3)
Chloride: 100 mmol/L — ABNORMAL LOW (ref 101–111)
Creatinine, Ser: 0.86 mg/dL (ref 0.44–1.00)
GFR calc Af Amer: >60 mL/min (ref >60)
GFR calc non Af Amer: >60 mL/min (ref >60)
Glucose, Bld: 118 mg/dL — ABNORMAL HIGH (ref 65–99)
Potassium: 3.3 mmol/L — ABNORMAL LOW (ref 3.5–5.1)
Sodium: 138 mmol/L (ref 135–145)
Total Bilirubin: 0.4 mg/dL (ref 0.3–1.2)
Total Protein: 8 g/dL (ref 6.5–8.1)

## 2017-08-09 LAB — CBC
HCT: 46.7 % — ABNORMAL HIGH (ref 36.0–46.0)
Hemoglobin: 15.4 g/dL — ABNORMAL HIGH (ref 12.0–15.0)
MCH: 28.8 pg (ref 26.0–34.0)
MCHC: 33 g/dL (ref 30.0–36.0)
MCV: 87.5 fL (ref 78.0–100.0)
Platelets: 240 10*3/uL (ref 150–400)
RBC: 5.34 MIL/uL — ABNORMAL HIGH (ref 3.87–5.11)
RDW: 14.4 % (ref 11.5–15.5)
WBC: 14.3 10*3/uL — ABNORMAL HIGH (ref 4.0–10.5)

## 2017-08-09 LAB — I-STAT CG4 LACTIC ACID, ED: Lactic Acid, Venous: 3.41 mmol/L (ref 0.5–1.9)

## 2017-08-09 LAB — LIPASE, BLOOD: Lipase: 22 U/L (ref 11–51)

## 2017-08-09 SURGERY — REPAIR, HERNIA, VENTRAL
Anesthesia: General | Site: Abdomen

## 2017-08-09 MED ORDER — PHENYLEPHRINE 40 MCG/ML (10ML) SYRINGE FOR IV PUSH (FOR BLOOD PRESSURE SUPPORT)
PREFILLED_SYRINGE | INTRAVENOUS | Status: AC
  Filled 2017-08-09: qty 10

## 2017-08-09 MED ORDER — LACTATED RINGERS IV SOLN
INTRAVENOUS | Status: DC | PRN
  Administered 2017-08-09: 23:00:00 via INTRAVENOUS

## 2017-08-09 MED ORDER — IOPAMIDOL (ISOVUE-300) INJECTION 61%
INTRAVENOUS | Status: AC
  Filled 2017-08-09: qty 100

## 2017-08-09 MED ORDER — PIPERACILLIN-TAZOBACTAM 3.375 G IVPB 30 MIN
3.3750 g | Freq: Once | INTRAVENOUS | Status: AC
  Administered 2017-08-09: 3.375 g via INTRAVENOUS
  Filled 2017-08-09: qty 50

## 2017-08-09 MED ORDER — EPHEDRINE 5 MG/ML INJ
INTRAVENOUS | Status: AC
  Filled 2017-08-09: qty 10

## 2017-08-09 MED ORDER — LIDOCAINE 2% (20 MG/ML) 5 ML SYRINGE
INTRAMUSCULAR | Status: DC | PRN
  Administered 2017-08-09: 60 mg via INTRAVENOUS

## 2017-08-09 MED ORDER — PROPOFOL 10 MG/ML IV BOLUS
INTRAVENOUS | Status: AC
  Filled 2017-08-09: qty 20

## 2017-08-09 MED ORDER — FENTANYL CITRATE (PF) 100 MCG/2ML IJ SOLN
50.0000 ug | Freq: Once | INTRAMUSCULAR | Status: AC
  Administered 2017-08-09: 50 ug via INTRAVENOUS
  Filled 2017-08-09: qty 2

## 2017-08-09 MED ORDER — ONDANSETRON HCL 4 MG/2ML IJ SOLN
4.0000 mg | Freq: Once | INTRAMUSCULAR | Status: AC
  Administered 2017-08-09: 4 mg via INTRAVENOUS
  Filled 2017-08-09: qty 2

## 2017-08-09 MED ORDER — ONDANSETRON 4 MG PO TBDP
ORAL_TABLET | ORAL | Status: AC
  Filled 2017-08-09: qty 1

## 2017-08-09 MED ORDER — ONDANSETRON HCL 4 MG/2ML IJ SOLN
INTRAMUSCULAR | Status: DC | PRN
  Administered 2017-08-09: 4 mg via INTRAVENOUS

## 2017-08-09 MED ORDER — FENTANYL CITRATE (PF) 250 MCG/5ML IJ SOLN
INTRAMUSCULAR | Status: AC
  Filled 2017-08-09: qty 5

## 2017-08-09 MED ORDER — ROCURONIUM BROMIDE 10 MG/ML (PF) SYRINGE
PREFILLED_SYRINGE | INTRAVENOUS | Status: DC | PRN
  Administered 2017-08-09: 30 mg via INTRAVENOUS
  Administered 2017-08-09: 20 mg via INTRAVENOUS

## 2017-08-09 MED ORDER — MIDAZOLAM HCL 2 MG/2ML IJ SOLN
INTRAMUSCULAR | Status: AC
  Filled 2017-08-09: qty 2

## 2017-08-09 MED ORDER — SUCCINYLCHOLINE CHLORIDE 20 MG/ML IJ SOLN
INTRAMUSCULAR | Status: DC | PRN
  Administered 2017-08-09: 160 mg via INTRAVENOUS

## 2017-08-09 MED ORDER — LACTATED RINGERS IV BOLUS (SEPSIS)
1000.0000 mL | Freq: Once | INTRAVENOUS | Status: AC
  Administered 2017-08-09: 1000 mL via INTRAVENOUS

## 2017-08-09 MED ORDER — PROPOFOL 10 MG/ML IV BOLUS
INTRAVENOUS | Status: DC | PRN
  Administered 2017-08-09: 30 mg via INTRAVENOUS
  Administered 2017-08-09: 150 mg via INTRAVENOUS

## 2017-08-09 MED ORDER — MIDAZOLAM HCL 5 MG/5ML IJ SOLN
INTRAMUSCULAR | Status: DC | PRN
  Administered 2017-08-09: 2 mg via INTRAVENOUS

## 2017-08-09 MED ORDER — DEXAMETHASONE SODIUM PHOSPHATE 10 MG/ML IJ SOLN
INTRAMUSCULAR | Status: DC | PRN
  Administered 2017-08-09: 10 mg via INTRAVENOUS

## 2017-08-09 MED ORDER — PHENYLEPHRINE HCL 10 MG/ML IJ SOLN
INTRAMUSCULAR | Status: DC | PRN
  Administered 2017-08-09: 80 ug via INTRAVENOUS

## 2017-08-09 MED ORDER — ONDANSETRON 4 MG PO TBDP
4.0000 mg | ORAL_TABLET | Freq: Once | ORAL | Status: AC | PRN
  Administered 2017-08-09: 4 mg via ORAL

## 2017-08-09 MED ORDER — FENTANYL CITRATE (PF) 250 MCG/5ML IJ SOLN
INTRAMUSCULAR | Status: DC | PRN
  Administered 2017-08-09: 100 ug via INTRAVENOUS

## 2017-08-09 MED ORDER — 0.9 % SODIUM CHLORIDE (POUR BTL) OPTIME
TOPICAL | Status: DC | PRN
  Administered 2017-08-09: 1000 mL

## 2017-08-09 SURGICAL SUPPLY — 32 items
BLADE CLIPPER SURG (BLADE) IMPLANT
CANISTER SUCT 3000ML PPV (MISCELLANEOUS) ×2 IMPLANT
CANISTER WOUND CARE 500ML ATS (WOUND CARE) ×2 IMPLANT
CHLORAPREP W/TINT 26ML (MISCELLANEOUS) ×2 IMPLANT
COVER SURGICAL LIGHT HANDLE (MISCELLANEOUS) ×2 IMPLANT
DECANTER SPIKE VIAL GLASS SM (MISCELLANEOUS) ×2 IMPLANT
DRAPE LAPAROSCOPIC ABDOMINAL (DRAPES) ×2 IMPLANT
DRSG VAC ATS LRG SENSATRAC (GAUZE/BANDAGES/DRESSINGS) ×2 IMPLANT
ELECT CAUTERY BLADE 6.4 (BLADE) ×2 IMPLANT
ELECT REM PT RETURN 9FT ADLT (ELECTROSURGICAL) ×2
ELECTRODE REM PT RTRN 9FT ADLT (ELECTROSURGICAL) ×1 IMPLANT
GAUZE SPONGE 4X4 12PLY STRL (GAUZE/BANDAGES/DRESSINGS) IMPLANT
GAUZE SPONGE 4X4 12PLY STRL LF (GAUZE/BANDAGES/DRESSINGS) ×2 IMPLANT
GLOVE BIO SURGEON STRL SZ7 (GLOVE) ×2 IMPLANT
GLOVE BIOGEL PI IND STRL 7.5 (GLOVE) ×1 IMPLANT
GLOVE BIOGEL PI INDICATOR 7.5 (GLOVE) ×1
GOWN STRL REUS W/ TWL LRG LVL3 (GOWN DISPOSABLE) ×2 IMPLANT
GOWN STRL REUS W/TWL LRG LVL3 (GOWN DISPOSABLE) ×2
KIT BASIN OR (CUSTOM PROCEDURE TRAY) ×2 IMPLANT
KIT ROOM TURNOVER OR (KITS) ×2 IMPLANT
NS IRRIG 1000ML POUR BTL (IV SOLUTION) ×2 IMPLANT
PACK GENERAL/GYN (CUSTOM PROCEDURE TRAY) ×2 IMPLANT
PAD ARMBOARD 7.5X6 YLW CONV (MISCELLANEOUS) ×4 IMPLANT
STAPLER VISISTAT 35W (STAPLE) IMPLANT
SUT NOV 1 T60/GS (SUTURE) IMPLANT
SUT NOVA 1 T20/GS 25DT (SUTURE) ×4 IMPLANT
SUT NOVA NAB GS-21 0 18 T12 DT (SUTURE) IMPLANT
TAPE CLOTH SURG 6X10 WHT LF (GAUZE/BANDAGES/DRESSINGS) ×2 IMPLANT
TOWEL OR 17X24 6PK STRL BLUE (TOWEL DISPOSABLE) ×2 IMPLANT
TOWEL OR 17X26 10 PK STRL BLUE (TOWEL DISPOSABLE) ×2 IMPLANT
TRAY FOLEY W/METER SILVER 14FR (SET/KITS/TRAYS/PACK) ×2 IMPLANT
WATER STERILE IRR 1000ML POUR (IV SOLUTION) IMPLANT

## 2017-08-09 NOTE — Anesthesia Preprocedure Evaluation (Addendum)
Anesthesia Evaluation  Patient identified by MRN, date of birth, ID band Patient awake    Reviewed: Allergy & Precautions, NPO status , Patient's Chart, lab work & pertinent test results  Airway Mallampati: I  TM Distance: >3 FB Neck ROM: Full    Dental  (+) Teeth Intact, Dental Advisory Given   Pulmonary Current Smoker,    breath sounds clear to auscultation       Cardiovascular hypertension, Pt. on medications  Rhythm:Regular Rate:Normal     Neuro/Psych negative neurological ROS     GI/Hepatic Neg liver ROS, GERD  Medicated,  Endo/Other  negative endocrine ROS  Renal/GU negative Renal ROS     Musculoskeletal  (+) Arthritis , Fibromyalgia -  Abdominal (+) + obese,   Peds  Hematology negative hematology ROS (+)   Anesthesia Other Findings - MS - no weakness, no recent flares  Reproductive/Obstetrics                            Anesthesia Physical Anesthesia Plan  ASA: III and emergent  Anesthesia Plan: General   Post-op Pain Management:    Induction: Intravenous, Rapid sequence and Cricoid pressure planned  PONV Risk Score and Plan: 3 and Ondansetron, Dexamethasone, Midazolam and Treatment may vary due to age or medical condition  Airway Management Planned: Oral ETT  Additional Equipment:   Intra-op Plan:   Post-operative Plan: Possible Post-op intubation/ventilation  Informed Consent: I have reviewed the patients History and Physical, chart, labs and discussed the procedure including the risks, benefits and alternatives for the proposed anesthesia with the patient or authorized representative who has indicated his/her understanding and acceptance.   Dental advisory given  Plan Discussed with: CRNA  Anesthesia Plan Comments:         Anesthesia Quick Evaluation

## 2017-08-09 NOTE — ED Provider Notes (Signed)
[facility] DEPT Provider Note   CSN: [id number] Arrival date & time: 08/09/17  1758  History   Chief Complaint Chief Complaint  Patient presents with  . Abdominal Pain  . Chest Pain    HPI Megan Dixon is a 51 y.o. female.  This is a 51 year old female with PMH of ventral hernia status post mesh repair in 2017, MS, fibromyalgia, arthritiswho presents with sudden onset of central epigastric pain that began last evening when the patient was sitting on the couch and worsened today around 1430 which prompted her to come to the ED for further evaluation. She also endorses 2 episodes of "greenish" (nonbloody) vomiting that occurred approximately one hour prior. Reports normal bowel movements, last BM was nonbloody this morning about 6 hours prior. No flatus today. Patient was seen for generalized abdominal pain in February where CT was performed and showed midline upper anterior abdominal wall ventral hernia and at that time only contained omental fat. Denies fevers, dyspnea, recent travel or change in diet.   The history is provided by the patient and medical records.    Past Medical History:  Diagnosis Date  . Arthritis   . Fibromyalgia   . MS (multiple sclerosis) [facility])     Patient Active Problem List   Diagnosis Date Noted  . S/P exploratory laparotomy 08/09/2017    Past Surgical History:  Procedure Laterality Date  . ABDOMINAL SURGERY    . HERNIA REPAIR    . JOINT REPLACEMENT      OB History    No data available       Home Medications    Prior to Admission medications   Medication Sig Start Date End Date Taking? Authorizing Provider  cyclobenzaprine (FLEXERIL) 10 MG tablet Take 10 mg by mouth 3 (three) times daily as needed for muscle spasms.   Yes [provider]  FLUoxetine (PROZAC) 20 MG capsule Take 60 mg by mouth daily.   Yes [provider]  gabapentin (NEURONTIN) 600 MG tablet Take 600 mg by mouth See admin instructions. ONE TO TWO  TIMES A DAY   Yes [provider]  losartan-hydrochlorothiazide (HYZAAR) 100-25 MG tablet Take 1 tablet by mouth daily.   Yes [provider]  naproxen sodium (ANAPROX) 220 MG tablet Take 220-440 mg by mouth 2 (two) times daily as needed (for pain).   Yes [provider]  pantoprazole (PROTONIX) 40 MG tablet Take 40 mg by mouth daily.   Yes [provider]  polyethylene glycol (MIRALAX) packet Mix 8 cap fulls in 32 ounces of juice/water/gatorade and drink over the course of a day. Once your stool is loose or liquid, stop taking. Patient taking differently: Take 17 g by mouth daily as needed for mild constipation.  02/04/17  Yes Duffy Bruce, MD  dicyclomine (BENTYL) 20 MG tablet Take 1 tablet (20 mg total) by mouth 4 (four) times daily -  before meals and at bedtime. Patient not taking: Reported on 08/09/2017 02/04/17 08/09/17  Duffy Bruce, MD  ondansetron (ZOFRAN) 4 MG tablet Take 1 tablet (4 mg total) by mouth every 8 (eight) hours as needed for nausea or vomiting. Patient not taking: Reported on 08/09/2017 02/04/17   Duffy Bruce, MD    Family History History reviewed. No pertinent family history.  Social History Social History  Substance Use Topics  . Smoking status: Current Every Day Smoker  . Smokeless tobacco: Never Used  . Alcohol use Yes     Allergies   Morphine and related; Other;  Peanut-containing drug products; and Toradol [ketorolac tromethamine]   Review of Systems Review of Systems  Constitutional: Negative for chills, diaphoresis and fever.  HENT: Negative for ear pain and sore throat.   Eyes: Negative for pain and visual disturbance.  Respiratory: Negative for cough, chest tightness and shortness of breath.   Cardiovascular: Negative for chest pain, palpitations and leg swelling.  Gastrointestinal: Positive for abdominal distention, constipation and vomiting. Negative for abdominal pain, anal bleeding, blood in stool and  diarrhea.  Genitourinary: Negative for difficulty urinating, dysuria, flank pain and hematuria.  Musculoskeletal: Negative for arthralgias and back pain.  Skin: Negative for color change and rash.  Allergic/Immunologic: Negative for immunocompromised state.  Neurological: Negative for seizures and syncope.  All other systems reviewed and are negative.    Physical Exam Updated Vital Signs BP (!) 188/70   Pulse 80   Temp 98.4 F (36.9 C) (Oral)   Resp 19   SpO2 100%   Physical Exam  Constitutional: She appears well-developed and well-nourished. She appears distressed.  HENT:  Head: Normocephalic and atraumatic.  Eyes: Pupils are equal, round, and reactive to light. Conjunctivae are normal.  Neck: Neck supple.  Cardiovascular: Normal rate and regular rhythm.   No murmur heard. Pulmonary/Chest: Effort normal and breath sounds normal. No respiratory distress.  Abdominal: Soft. There is tenderness in the epigastric area. There is guarding. There is no rigidity. A hernia is present. Hernia confirmed positive in the ventral area.  Musculoskeletal: She exhibits no edema.  Neurological: She is alert.  Skin: Skin is warm and dry.  Psychiatric: She has a normal mood and affect.  Nursing note and vitals reviewed.  ED Treatments / Results  Labs (all labs ordered are listed, but only abnormal results are displayed) Labs Reviewed  COMPREHENSIVE METABOLIC PANEL - Abnormal; Notable for the following:       Result Value   Potassium 3.3 (*)    Chloride 100 (*)    Glucose, Bld 118 (*)    All other components within normal limits  CBC - Abnormal; Notable for the following:    WBC 14.3 (*)    RBC 5.34 (*)    Hemoglobin 15.4 (*)    HCT 46.7 (*)    All other components within normal limits  URINALYSIS, ROUTINE W REFLEX MICROSCOPIC - Abnormal; Notable for the following:    APPearance CLOUDY (*)    Protein, ur 30 (*)    Squamous Epithelial / LPF 0-5 (*)    All other components within  normal limits  I-STAT CG4 LACTIC ACID, ED - Abnormal; Notable for the following:    Lactic Acid, Venous 3.41 (*)    All other components within normal limits  LIPASE, BLOOD  I-STAT CG4 LACTIC ACID, ED    EKG  EKG Interpretation  Date/Time:  Monday August 09 2017 18:04:00 EDT Ventricular Rate:  78 PR Interval:  156 QRS Duration: 86 QT Interval:  418 QTC Calculation: 476 R Axis:   119 Text Interpretation:  Normal sinus rhythm Left posterior fascicular block Cannot rule out Anterior infarct , age undetermined Abnormal ECG No significant change since last tracing Confirmed by Addison Lank [phone]) on 08/09/2017 6:41:40 PM       Radiology Ct Abdomen Pelvis W Contrast  Result Date: 08/09/2017 CLINICAL DATA:  Epigastric pain EXAM: CT ABDOMEN AND PELVIS WITH CONTRAST TECHNIQUE: Multidetector CT imaging of the abdomen and pelvis was performed using the standard protocol following bolus administration of intravenous contrast. CONTRAST:  100 mL Isovue-300  intravenous COMPARISON:  02/04/2017 FINDINGS: Lower chest: Lung bases demonstrate no acute consolidation or pleural effusion. Normal heart size. Hepatobiliary: No focal liver abnormality is seen. No gallstones, gallbladder wall thickening, or biliary dilatation. Pancreas: Unremarkable. No pancreatic ductal dilatation or surrounding inflammatory changes. Spleen: Normal in size without focal abnormality. Adrenals/Urinary Tract: Nodular thickening of left adrenal gland. Right adrenal gland is within normal limits. Subcentimeter cortical hypodensity mid left kidney too small to further characterize. The bladder is within normal limits. Stomach/Bowel: Stomach is nonenlarged. No dilated small bowel. Dilated fluid-filled cecum, ascending colon and proximal transverse colon, secondary to a ventral hernia containing mid transverse colon. Distal transverse colon and left colon are decompressed. Sigmoid colon diverticular changes without acute inflammation  Vascular/Lymphatic: Aortic atherosclerosis. No enlarged abdominal or pelvic lymph nodes. Reproductive: Uterus and bilateral adnexa are unremarkable. Other: Negative for free air. Trace free fluid in the pelvis. Midline abdominal scarring. Small fat containing ventral hernia. Inferior to this is a larger ventral hernia with anterior fascia defect measuring 4.4 cm. This contains transverse colon. There is fluid and soft tissue stranding in the hernia sac. Musculoskeletal: No acute or significant osseous findings. IMPRESSION: 1. Obstruction of the right colon and proximal transverse colon with transition point related to a ventral hernia containing transverse colon. Fluid and edema within the hernia sac suggests mild degree of incarceration. 2. Sigmoid colon diverticular disease without acute inflammation. Electronically Signed   By: Donavan Foil M.D.   On: 08/09/2017 21:55    Procedures Procedures (including critical care time)  Medications Ordered in ED Medications  iopamidol (ISOVUE-300) 61 % injection (not administered)  ondansetron (ZOFRAN-ODT) disintegrating tablet 4 mg (4 mg Oral Given 08/09/17 1811)  fentaNYL (SUBLIMAZE) injection 50 mcg (50 mcg Intravenous Given 08/09/17 1959)  lactated ringers bolus 1,000 mL (0 mLs Intravenous Stopped 08/09/17 2159)  ondansetron (ZOFRAN) injection 4 mg (4 mg Intravenous Given 08/09/17 1959)  fentaNYL (SUBLIMAZE) injection 50 mcg (50 mcg Intravenous Given 08/09/17 2231)  piperacillin-tazobactam (ZOSYN) IVPB 3.375 g (3.375 g Intravenous New Bag/Given 08/09/17 2231)   Initial Impression / Assessment and Plan / ED Course  I have reviewed the triage vital signs and the nursing notes.  Pertinent labs & imaging results that were available during my care of the patient were reviewed by me and considered in my medical decision making (see chart for details).     This is a 51 year old female with PMH of ventral hernia status post repair in 2017, MS, fibromyalgia,  arthritiswho presents with sudden onset of central epigastric pain.  Large ventral hernia observed on abdominal exam. Not reducible, patient in acute pain and in tears.  I-stat lactate 3.4, leukocytosis noted.  Patient given LR bolus, IV antiemetics, IV pain medications.  General surgery contacted. They agree with acquiring CT abdomen pelvis with contrast. CT concerning for mesenteric stranding with bowel loop in ventral hernia apparent. Zosyn given. Surgery contacted again who agreed patient to go to OR for acute intervention.  Patient was redosed with IV pain medications after CT.  Final Clinical Impressions(s) / ED Diagnoses   Final diagnoses:  Incarcerated ventral hernia    New Prescriptions Current Discharge Medication List       Aldona Lento, MD 08/10/17 0019    Fatima Blank, MD 08/10/17 [phone]

## 2017-08-09 NOTE — Anesthesia Procedure Notes (Addendum)
Procedure Name: Intubation Performed by: Lelon Perla A Pre-anesthesia Checklist: Patient identified, Emergency Drugs available, Suction available and Patient being monitored Patient Re-evaluated:Patient Re-evaluated prior to induction Oxygen Delivery Method: Circle system utilized Preoxygenation: Pre-oxygenation with 100% oxygen Induction Type: IV induction, Rapid sequence and Cricoid Pressure applied Laryngoscope Size: Miller and 2 Grade View: Grade I Tube type: Oral Tube size: 7.5 mm Number of attempts: 1 Airway Equipment and Method: Stylet Placement Confirmation: ETT inserted through vocal cords under direct vision,  positive ETCO2 and breath sounds checked- equal and bilateral Secured at: 22 cm Tube secured with: Tape Dental Injury: Teeth and Oropharynx as per pre-operative assessment

## 2017-08-09 NOTE — ED Triage Notes (Signed)
Pt to ER for evaluation of epigastric abd pain onset last night that has progressively worsened. Hernia noted. States hx of same. Also reports general chest pain that started after vomiting. Hypertensive at 205/108.

## 2017-08-09 NOTE — H&P (Signed)
Megan Dixon is an 51 y.o. female.   Chief Complaint: abdominal pain HPI: 50 yof who has prior history of ventral hernia repair likely with mesh in [place] in last couple years. She has history of ms, fibromyalgia, depression and htn.  She had abdominal pain last February and was noted on ct here to have incarcerated hernia with omentum.  She did not follow up with this and did not remember that. She has about 24 hours of abdominal pain and bulge that will not go away. Some nausea and emesis.  Having bms. No fevers. Nothing making this better now. She presented to er.   Past Medical History:  Diagnosis Date  . Arthritis   . Fibromyalgia   . MS (multiple sclerosis) ([employer])     Past Surgical History:  Procedure Laterality Date  . ABDOMINAL SURGERY    . HERNIA REPAIR    . JOINT REPLACEMENT      History reviewed. No pertinent family history. Social History:  reports that she has been smoking.  She has never used smokeless tobacco. She reports that she drinks alcohol. Her drug history is not on file.  Allergies:  Allergies  Allergen Reactions  . Morphine And Related Other (See Comments)    Headache  . Other Other (See Comments)    CANNOT EAT ANYTHING WITH SEEDS (DIVERTICULITIS)  . Peanut-Containing Drug Products Other (See Comments)    Has Diverticulitis   . Toradol [Ketorolac Tromethamine] Anxiety   meds reviewed  Results for orders placed or performed during the hospital encounter of 08/09/17 (from the past 48 hour(s))  Lipase, blood     Status: None   Collection Time: 08/09/17  6:20 PM  Result Value Ref Range   Lipase 22 11 - 51 U/L  Comprehensive metabolic panel     Status: Abnormal   Collection Time: 08/09/17  6:20 PM  Result Value Ref Range   Sodium 138 135 - 145 mmol/L   Potassium 3.3 (L) 3.5 - 5.1 mmol/L   Chloride 100 (L) 101 - 111 mmol/L   CO2 26 22 - 32 mmol/L   Glucose, Bld 118 (H) 65 - 99 mg/dL   BUN 11 6 - 20 mg/dL   Creatinine, Ser 0.86 0.44 - 1.00 mg/dL     Calcium 9.4 8.9 - 10.3 mg/dL   Total Protein 8.0 6.5 - 8.1 g/dL   Albumin 4.0 3.5 - 5.0 g/dL   AST 27 15 - 41 U/L   ALT 15 14 - 54 U/L   Alkaline Phosphatase 108 38 - 126 U/L   Total Bilirubin 0.4 0.3 - 1.2 mg/dL   GFR calc non Af Amer >60 >60 mL/min   GFR calc Af Amer >60 >60 mL/min    Comment: (NOTE) The eGFR has been calculated using the CKD EPI equation. This calculation has not been validated in all clinical situations. eGFR's persistently <60 mL/min signify possible Chronic Kidney Disease.    Anion gap 12 5 - 15  CBC     Status: Abnormal   Collection Time: 08/09/17  6:20 PM  Result Value Ref Range   WBC 14.3 (H) 4.0 - 10.5 K/uL   RBC 5.34 (H) 3.87 - 5.11 MIL/uL   Hemoglobin 15.4 (H) 12.0 - 15.0 g/dL   HCT 46.7 (H) 36.0 - 46.0 %   MCV 87.5 78.0 - 100.0 fL   MCH 28.8 26.0 - 34.0 pg   MCHC 33.0 30.0 - 36.0 g/dL   RDW 14.4 11.5 - 15.5 %  Platelets 240 150 - 400 K/uL  I-Stat CG4 Lactic Acid, ED     Status: Abnormal   Collection Time: 08/09/17  7:09 PM  Result Value Ref Range   Lactic Acid, Venous 3.41 (HH) 0.5 - 1.9 mmol/L   Comment NOTIFIED PHYSICIAN   Urinalysis, Routine w reflex microscopic     Status: Abnormal   Collection Time: 08/09/17  8:43 PM  Result Value Ref Range   Color, Urine YELLOW YELLOW   APPearance CLOUDY (A) CLEAR   Specific Gravity, Urine 1.011 1.005 - 1.030   pH 8.0 5.0 - 8.0   Glucose, UA NEGATIVE NEGATIVE mg/dL   Hgb urine dipstick NEGATIVE NEGATIVE   Bilirubin Urine NEGATIVE NEGATIVE   Ketones, ur NEGATIVE NEGATIVE mg/dL   Protein, ur 30 (A) NEGATIVE mg/dL   Nitrite NEGATIVE NEGATIVE   Leukocytes, UA NEGATIVE NEGATIVE   RBC / HPF 0-5 0 - 5 RBC/hpf   WBC, UA 0-5 0 - 5 WBC/hpf   Bacteria, UA NONE SEEN NONE SEEN   Squamous Epithelial / LPF 0-5 (A) NONE SEEN   Amorphous Crystal PRESENT    Ct Abdomen Pelvis W Contrast  Result Date: 08/09/2017 CLINICAL DATA:  Epigastric pain EXAM: CT ABDOMEN AND PELVIS WITH CONTRAST TECHNIQUE: Multidetector  CT imaging of the abdomen and pelvis was performed using the standard protocol following bolus administration of intravenous contrast. CONTRAST:  100 mL Isovue-300 intravenous COMPARISON:  02/04/2017 FINDINGS: Lower chest: Lung bases demonstrate no acute consolidation or pleural effusion. Normal heart size. Hepatobiliary: No focal liver abnormality is seen. No gallstones, gallbladder wall thickening, or biliary dilatation. Pancreas: Unremarkable. No pancreatic ductal dilatation or surrounding inflammatory changes. Spleen: Normal in size without focal abnormality. Adrenals/Urinary Tract: Nodular thickening of left adrenal gland. Right adrenal gland is within normal limits. Subcentimeter cortical hypodensity mid left kidney too small to further characterize. The bladder is within normal limits. Stomach/Bowel: Stomach is nonenlarged. No dilated small bowel. Dilated fluid-filled cecum, ascending colon and proximal transverse colon, secondary to a ventral hernia containing mid transverse colon. Distal transverse colon and left colon are decompressed. Sigmoid colon diverticular changes without acute inflammation Vascular/Lymphatic: Aortic atherosclerosis. No enlarged abdominal or pelvic lymph nodes. Reproductive: Uterus and bilateral adnexa are unremarkable. Other: Negative for free air. Trace free fluid in the pelvis. Midline abdominal scarring. Small fat containing ventral hernia. Inferior to this is a larger ventral hernia with anterior fascia defect measuring 4.4 cm. This contains transverse colon. There is fluid and soft tissue stranding in the hernia sac. Musculoskeletal: No acute or significant osseous findings. IMPRESSION: 1. Obstruction of the right colon and proximal transverse colon with transition point related to a ventral hernia containing transverse colon. Fluid and edema within the hernia sac suggests mild degree of incarceration. 2. Sigmoid colon diverticular disease without acute inflammation.  Electronically Signed   By: Donavan Foil M.D.   On: 08/09/2017 21:55    Review of Systems  Gastrointestinal: Positive for abdominal pain, nausea and vomiting.  All other systems reviewed and are negative.   Blood pressure (!) 205/108, pulse 81, temperature 98.4 F (36.9 C), temperature source Oral, resp. rate 18, SpO2 100 %. Physical Exam  Vitals reviewed. Constitutional: She is oriented to person, place, and time. She appears well-developed and well-nourished.  HENT:  Head: Normocephalic and atraumatic.  Right Ear: External ear normal.  Left Ear: External ear normal.  Mouth/Throat: Oropharynx is clear and moist.  Eyes: Pupils are equal, round, and reactive to light. EOM are normal.  Neck: Neck  supple. No thyromegaly present.  Cardiovascular: Normal rate, regular rhythm, normal heart sounds and intact distal pulses.   Respiratory: Effort normal and breath sounds normal. She has no wheezes. She has no rales.  GI: She exhibits distension. Bowel sounds are decreased. There is tenderness in the epigastric area. A hernia is present. Hernia confirmed positive in the ventral area.    Lymphadenopathy:    She has no cervical adenopathy.  Neurological: She is alert and oriented to person, place, and time.  Skin: Skin is warm. She is diaphoretic.  Psychiatric: She has a normal mood and affect. Her behavior is normal. Thought content normal.     Assessment/Plan Incarcerated incisional hernia  She has elevated wbc, elevated lactate and tender on exam.  This is clearly different than last ct and I am concerned about incarcerated bowel.  I discussed with patient, her daughter and daughter in law (who is a cma and was on phone) that I think she needs to go urgently to the or.  I recommended ex lap with likely primary repair of the hernia, possibly resecting bowel and possible colostomy.  We discussed she likely would have recurrence of her hernia, other risks including but not limited to  bleeding, infection, open wound, long term ileus, cardiac and pulmonary issues.  Will proceed asap  Valorie Mcgrory, MD 08/09/2017, 10:08 PM

## 2017-08-09 NOTE — ED Notes (Signed)
Patient taken to Short stay OR by EMT

## 2017-08-10 ENCOUNTER — Encounter (HOSPITAL_COMMUNITY): Payer: Self-pay | Admitting: General Surgery

## 2017-08-10 DIAGNOSIS — K430 Incisional hernia with obstruction, without gangrene: Secondary | ICD-10-CM | POA: Diagnosis present

## 2017-08-10 HISTORY — DX: Incisional hernia with obstruction, without gangrene: K43.0

## 2017-08-10 LAB — CBC
HCT: 43.5 % (ref 36.0–46.0)
Hemoglobin: 14.1 g/dL (ref 12.0–15.0)
MCH: 28.5 pg (ref 26.0–34.0)
MCHC: 32.4 g/dL (ref 30.0–36.0)
MCV: 88.1 fL (ref 78.0–100.0)
Platelets: 213 10*3/uL (ref 150–400)
RBC: 4.94 MIL/uL (ref 3.87–5.11)
RDW: 15 % (ref 11.5–15.5)
WBC: 15 10*3/uL — ABNORMAL HIGH (ref 4.0–10.5)

## 2017-08-10 LAB — BASIC METABOLIC PANEL
Anion gap: 10 (ref 5–15)
BUN: 10 mg/dL (ref 6–20)
CO2: 28 mmol/L (ref 22–32)
Calcium: 8.7 mg/dL — ABNORMAL LOW (ref 8.9–10.3)
Chloride: 101 mmol/L (ref 101–111)
Creatinine, Ser: 0.84 mg/dL (ref 0.44–1.00)
GFR calc Af Amer: >60 mL/min (ref >60)
GFR calc non Af Amer: >60 mL/min (ref >60)
Glucose, Bld: 123 mg/dL — ABNORMAL HIGH (ref 65–99)
Potassium: 3.9 mmol/L (ref 3.5–5.1)
Sodium: 139 mmol/L (ref 135–145)

## 2017-08-10 LAB — MRSA PCR SCREENING: MRSA by PCR: NEGATIVE

## 2017-08-10 MED ORDER — FLUOXETINE HCL 20 MG PO CAPS
60.0000 mg | ORAL_CAPSULE | Freq: Every day | ORAL | Status: DC
  Administered 2017-08-11 – 2017-08-13 (×3): 60 mg via ORAL
  Filled 2017-08-10 (×3): qty 3

## 2017-08-10 MED ORDER — MEPERIDINE HCL 25 MG/ML IJ SOLN: 6.2500 mg | INTRAMUSCULAR | Status: DC | PRN

## 2017-08-10 MED ORDER — HYDROMORPHONE HCL 1 MG/ML IJ SOLN
INTRAMUSCULAR | Status: AC
  Filled 2017-08-10: qty 1

## 2017-08-10 MED ORDER — PROMETHAZINE HCL 25 MG/ML IJ SOLN: 6.2500 mg | INTRAMUSCULAR | Status: DC | PRN

## 2017-08-10 MED ORDER — LACTATED RINGERS IV SOLN: INTRAVENOUS | Status: DC

## 2017-08-10 MED ORDER — SODIUM CHLORIDE 0.9 % IV SOLN
INTRAVENOUS | Status: DC
  Administered 2017-08-10 (×2): via INTRAVENOUS

## 2017-08-10 MED ORDER — HYDROMORPHONE HCL 1 MG/ML IJ SOLN
1.0000 mg | INTRAMUSCULAR | Status: DC | PRN
  Administered 2017-08-10 (×3): 1 mg via INTRAVENOUS
  Filled 2017-08-10 (×3): qty 1

## 2017-08-10 MED ORDER — ENOXAPARIN SODIUM 40 MG/0.4ML ~~LOC~~ SOLN
40.0000 mg | SUBCUTANEOUS | Status: DC
  Administered 2017-08-11 – 2017-08-13 (×3): 40 mg via SUBCUTANEOUS
  Filled 2017-08-10 (×3): qty 0.4

## 2017-08-10 MED ORDER — LABETALOL HCL 5 MG/ML IV SOLN
INTRAVENOUS | Status: AC
  Filled 2017-08-10: qty 4

## 2017-08-10 MED ORDER — HYDROMORPHONE HCL 1 MG/ML IJ SOLN
0.2500 mg | INTRAMUSCULAR | Status: DC | PRN
  Administered 2017-08-10 (×4): 0.5 mg via INTRAVENOUS

## 2017-08-10 MED ORDER — PNEUMOCOCCAL VAC POLYVALENT 25 MCG/0.5ML IJ INJ: 0.5000 mL | INJECTION | INTRAMUSCULAR | Status: DC

## 2017-08-10 MED ORDER — SUGAMMADEX SODIUM 200 MG/2ML IV SOLN
INTRAVENOUS | Status: DC | PRN
  Administered 2017-08-10: 200 mg via INTRAVENOUS

## 2017-08-10 MED ORDER — ONDANSETRON HCL 4 MG/2ML IJ SOLN
4.0000 mg | Freq: Four times a day (QID) | INTRAMUSCULAR | Status: DC | PRN
  Administered 2017-08-10 – 2017-08-13 (×2): 4 mg via INTRAVENOUS
  Filled 2017-08-10 (×2): qty 2

## 2017-08-10 MED ORDER — HYDRALAZINE HCL 20 MG/ML IJ SOLN
10.0000 mg | INTRAMUSCULAR | Status: DC | PRN
  Administered 2017-08-13: 10 mg via INTRAVENOUS
  Filled 2017-08-10: qty 1

## 2017-08-10 MED ORDER — LABETALOL HCL 5 MG/ML IV SOLN
5.0000 mg | INTRAVENOUS | Status: DC | PRN
  Administered 2017-08-10: 5 mg via INTRAVENOUS

## 2017-08-10 MED ORDER — ACETAMINOPHEN 10 MG/ML IV SOLN
1000.0000 mg | Freq: Four times a day (QID) | INTRAVENOUS | Status: DC
  Administered 2017-08-10: 1000 mg via INTRAVENOUS

## 2017-08-10 MED ORDER — ONDANSETRON 4 MG PO TBDP: 4.0000 mg | ORAL_TABLET | Freq: Four times a day (QID) | ORAL | Status: DC | PRN

## 2017-08-10 MED ORDER — SIMETHICONE 80 MG PO CHEW
40.0000 mg | CHEWABLE_TABLET | Freq: Four times a day (QID) | ORAL | Status: DC | PRN
  Administered 2017-08-10: 40 mg via ORAL
  Filled 2017-08-10: qty 1

## 2017-08-10 MED ORDER — ACETAMINOPHEN 10 MG/ML IV SOLN
INTRAVENOUS | Status: AC
  Filled 2017-08-10: qty 100

## 2017-08-10 MED ORDER — METHOCARBAMOL 1000 MG/10ML IJ SOLN: 500.0000 mg | Freq: Three times a day (TID) | INTRAMUSCULAR | Status: DC | PRN

## 2017-08-10 MED ORDER — ACETAMINOPHEN 10 MG/ML IV SOLN
1000.0000 mg | Freq: Four times a day (QID) | INTRAVENOUS | Status: AC
  Administered 2017-08-10 – 2017-08-11 (×3): 1000 mg via INTRAVENOUS
  Filled 2017-08-10 (×4): qty 100

## 2017-08-10 MED ORDER — PANTOPRAZOLE SODIUM 40 MG IV SOLR
40.0000 mg | Freq: Every day | INTRAVENOUS | Status: DC
  Administered 2017-08-10 – 2017-08-12 (×3): 40 mg via INTRAVENOUS
  Filled 2017-08-10 (×3): qty 40

## 2017-08-10 NOTE — Plan of Care (Signed)
Problem: Nutrition: Goal: Adequate nutrition will be maintained Outcome: Not Progressing NPO  Comments: Post surgical day 1- NPO NG in absent bowel sounds

## 2017-08-10 NOTE — Progress Notes (Signed)
Anesthesia MD notified pts BP 180/96.  Medication orders received.  Will administer and monitor and notify MD for changes.

## 2017-08-10 NOTE — Op Note (Signed)
Preoperative diagnosis: incarcerated incisional ventral hernia Postoperative diagnosis: same as above Procedure: exploratory laparotomy, primary repair incarcerated incisional ventral hernia Surgeon: Dr Serita Grammes Anesthesia: General Complications none Drains none Specimens none EBL: per anesthesia record Sponge and needle count correct at completion dispo to recovery stable  Indications: this is a 3 yof who has [place] who has known recurrence last February by ct scan here. She has developed acute pain and has on ct scan an incarcerated hernia with transverse colon present in the hernia sac.  On exam she is tender with elevated wbc and lactate. I discussed with she and her family urgent exploration.    Procedure: After informed consent obtained patient was taken to the OR. She was given zosyn in er.  SCDs were in place.  She was placed under general anesthesia without complication. A foley and an orogastric tube were placed.  She was prepped and draped in standard sterile surgical fashion. A surgical timeout was performed.    I made a midline incision that encompassed her old small upper midline incision and was overlying the hernia.  I dissected down to the fascia above and below.  Above the defect there was mesh present. I was able to dissect through what appeared to be mesh into the peritoneal cavity above the very large incarcerated hernia.  I dissected circumferentially around this to get to fascial edges. During the dissection the colon reduced.  I then opened the fascia into the defect. I opened the sac. There was cloudy fluid present and the sac was partially necrotic. I excised the entire sac.  I then examined the omentum and colon that had been incarcerated and this was all viable. I did divide the omentum as there was a hole present.  This was all placed back into the abdominal cavity.  I then elected to close the  defect with multiple figure of 8 #1 novafil sutures. I did not want to place mesh given the fascial appearance, fluid and her general health status. I think this hernia will recur.  I closed the defect with the upper portion being her old mesh closed.  This obliterated the defect. Hemostasis was observed. Due to my concern for infection I left the wound open and placed a vac sponge.  This was secured and functional upon completion. She tolerated this well, was extubated and transferred to recovery stable.

## 2017-08-10 NOTE — Consult Note (Addendum)
WOC consult requested to change Vac dressing tomorrow.  Vac intact with good seal at 16mm cont suction.  WOC will plan to change tomorrow as requested with PA at the bedside for the first post-op dressing change. Julien Girt MSN, RN, [place], [place], [place]

## 2017-08-10 NOTE — Progress Notes (Signed)
1 Day Post-Op    CC:  Abdominal pain  Subjective: Awake and alert.  Site is OK.  She is very tender, even flushing air into the NG.   She normally walks on her own.  Just on Flexeril and Neurontin for her fibromyalgia.    Objective: Vital signs in last 24 hours: Temp:  [97.7 F (36.5 C)-98.7 F (37.1 C)] 98.2 F (36.8 C) (08/28 0308) Pulse Rate:  [73-86] 73 (08/28 0320) Resp:  [17-27] 18 (08/28 0320) BP: (138-205)/(70-109) 142/84 (08/28 0308) SpO2:  [93 %-100 %] 100 % (08/28 0320) Weight:  [117 kg (257 lb 15 oz)] 117 kg (257 lb 15 oz) (08/28 0200)  1656 IV Urine 625  drain 10 Afebrile, BP up on admit/ better now WBC is up otherwise pretty normal labs   Intake/Output from previous day: 08/27 0701 - 08/28 0700 In: 1656.7 [I.V.:1626.7; NG/GT:30] Out: 685 [Urine:625; Drains:10; Blood:50] Intake/Output this shift: No intake/output data recorded.  General appearance: alert, cooperative and no distress Resp: clear to auscultation bilaterally GI: soft, very tender to any touch/movement, few BS.  Nothing from the NG.  Lab Results:   Recent Labs  08/09/17 1820 08/10/17 0314  WBC 14.3* 15.0*  HGB 15.4* 14.1  HCT 46.7* 43.5  PLT 240 213    BMET  Recent Labs  08/09/17 1820 08/10/17 0314  NA 138 139  K 3.3* 3.9  CL 100* 101  CO2 26 28  GLUCOSE 118* 123*  BUN 11 10  CREATININE 0.86 0.84  CALCIUM 9.4 8.7*   PT/INR No results for input(s): LABPROT, INR in the last 72 hours.   Recent Labs Lab 08/09/17 1820  AST 27  ALT 15  ALKPHOS 108  BILITOT 0.4  PROT 8.0  ALBUMIN 4.0     Lipase     Component Value Date/Time   LIPASE 22 08/09/2017 1820     Prior to Admission medications   Medication Sig Start Date End Date Taking? Authorizing Provider  cyclobenzaprine (FLEXERIL) 10 MG tablet Take 10 mg by mouth 3 (three) times daily as needed for muscle spasms.   Yes [provider]  FLUoxetine (PROZAC) 20 MG capsule Take 60 mg by mouth daily.   Yes  [provider]  gabapentin (NEURONTIN) 600 MG tablet Take 600 mg by mouth See admin instructions. ONE TO TWO TIMES A DAY   Yes [provider]  losartan-hydrochlorothiazide (HYZAAR) 100-25 MG tablet Take 1 tablet by mouth daily.   Yes [provider]  naproxen sodium (ANAPROX) 220 MG tablet Take 220-440 mg by mouth 2 (two) times daily as needed (for pain).   Yes [provider]  pantoprazole (PROTONIX) 40 MG tablet Take 40 mg by mouth daily.   Yes [provider]  polyethylene glycol (MIRALAX) packet Mix 8 cap fulls in 32 ounces of juice/water/gatorade and drink over the course of a day. Once your stool is loose or liquid, stop taking. Patient taking differently: Take 17 g by mouth daily as needed for mild constipation.  02/04/17  Yes Duffy Bruce, MD  dicyclomine (BENTYL) 20 MG tablet Take 1 tablet (20 mg total) by mouth 4 (four) times daily -  before meals and at bedtime. Patient not taking: Reported on 08/09/2017 02/04/17 08/09/17  Duffy Bruce, MD  ondansetron (ZOFRAN) 4 MG tablet Take 1 tablet (4 mg total) by mouth every 8 (eight) hours as needed for nausea or vomiting. Patient not taking: Reported on 08/09/2017 02/04/17   Duffy Bruce, MD  Medications: . [START ON 08/11/2017] enoxaparin (LOVENOX) injection  40 mg Subcutaneous Q24H  . [START ON 08/11/2017] FLUoxetine  60 mg Oral Daily  . iopamidol      . labetalol      . pantoprazole (PROTONIX) IV  40 mg Intravenous QHS  . [START ON 08/11/2017] pneumococcal 23 valent vaccine  0.5 mL Intramuscular Tomorrow-1000   . sodium chloride 100 mL/hr at 08/10/17 0144  . acetaminophen    . methocarbamol (ROBAXIN)  IV     Anti-infectives    Start     Dose/Rate Route Frequency Ordered Stop   08/09/17 2115  piperacillin-tazobactam (ZOSYN) IVPB 3.375 g     3.375 g 100 mL/hr over 30 Minutes Intravenous  Once 08/09/17 2111 08/09/17 2301      Assessment/Plan Incarcerated  incisional Ventral  Hernia Exploratory laparotomy, primary repair of incarcerated ventral hernia, Wound vac placement, 08/10/17, Dr. Rolm Bookbinder  POD #1 Fibromyalgia Multiple Sclerosis Arthritis Body mass index is 42.9 FEN:  IV fluids/NPO-ice chips ID:  Zosyn 08/09/17 preop only DVT:  Lovenox/SCD   Plan:  OOB today, if no issues pull NG later, decide on pulling foley after we see how she does getting OOB.  I will have wound nurse come and start dressing changes of the wound vac tomorrow.      LOS: 1 day    Erasmus Bistline 08/10/2017 [phone]

## 2017-08-10 NOTE — Transfer of Care (Signed)
Immediate Anesthesia Transfer of Care Note  Patient: Megan Dixon  Procedure(s) Performed: Procedure(s): Incarcerated ventral hernia repair with mesh  (N/A)  Patient Location: PACU  Anesthesia Type:General  Level of Consciousness: awake and oriented  Airway & Oxygen Therapy: Patient Spontanous Breathing and Patient connected to nasal cannula oxygen  Post-op Assessment: Report given to RN and Post -op Vital signs reviewed and stable  Post vital signs: Reviewed and stable  Last Vitals:  Vitals:   08/09/17 2230 08/10/17 0021  BP: (!) 188/70 (!) 197/109  Pulse: 80 86  Resp: 19 (!) 27  Temp:  36.5 C  SpO2: 100% 93%    Last Pain:  Vitals:   08/09/17 1810  TempSrc: Oral  PainSc:          Complications: No apparent anesthesia complications

## 2017-08-10 NOTE — Progress Notes (Signed)
Pt sat at edge of the bed for 30 minutes. Then stood up at bedside for 10 minutes. Luana BSN MSN RN3 08/10/2017 @ 16.30

## 2017-08-10 NOTE — Progress Notes (Signed)
Notified MD that pt continues to have pain 10/10.  MD wants to give a dose of IV Tylenol.  Also notified MD that pt's bp has come down to 150's/80's.  Will continue to monitor and notify for further changes.

## 2017-08-11 ENCOUNTER — Encounter (HOSPITAL_COMMUNITY): Payer: Self-pay | Admitting: General Surgery

## 2017-08-11 MED ORDER — CODEINE SULFATE 15 MG PO TABS
15.0000 mg | ORAL_TABLET | ORAL | Status: DC | PRN
  Administered 2017-08-11 (×2): 15 mg via ORAL
  Administered 2017-08-12 – 2017-08-13 (×3): 30 mg via ORAL
  Filled 2017-08-11: qty 1
  Filled 2017-08-11: qty 2
  Filled 2017-08-11: qty 1
  Filled 2017-08-11 (×2): qty 2

## 2017-08-11 MED ORDER — GELATIN ABSORBABLE 12-7 MM EX MISC
1.0000 | Freq: Once | CUTANEOUS | Status: DC
  Filled 2017-08-11: qty 1

## 2017-08-11 MED ORDER — SILVER NITRATE-POT NITRATE 75-25 % EX MISC
10.0000 | Freq: Once | CUTANEOUS | Status: DC
  Filled 2017-08-11: qty 10

## 2017-08-11 MED ORDER — LIDOCAINE HCL (PF) 1 % IJ SOLN
INTRAMUSCULAR | Status: AC
  Filled 2017-08-11: qty 5

## 2017-08-11 MED ORDER — LIDOCAINE-EPINEPHRINE 1 %-1:100000 IJ SOLN
20.0000 mL | Freq: Once | INTRAMUSCULAR | Status: DC
  Filled 2017-08-11: qty 20

## 2017-08-11 MED ORDER — HYDROMORPHONE HCL 1 MG/ML IJ SOLN
1.0000 mg | INTRAMUSCULAR | Status: DC | PRN
  Administered 2017-08-11 – 2017-08-13 (×8): 1 mg via INTRAVENOUS
  Filled 2017-08-11 (×8): qty 1

## 2017-08-11 MED ORDER — ACETAMINOPHEN 500 MG PO TABS
1000.0000 mg | ORAL_TABLET | Freq: Three times a day (TID) | ORAL | Status: DC
  Administered 2017-08-11 – 2017-08-13 (×8): 1000 mg via ORAL
  Filled 2017-08-11 (×8): qty 2

## 2017-08-11 NOTE — Anesthesia Postprocedure Evaluation (Signed)
Anesthesia Post Note  Patient: Megan Dixon  Procedure(s) Performed: Procedure(s) (LRB): Incarcerated ventral hernia repair with mesh  (N/A)     Patient location during evaluation: PACU Anesthesia Type: General Level of consciousness: awake and alert Pain management: pain level controlled Vital Signs Assessment: post-procedure vital signs reviewed and stable Respiratory status: spontaneous breathing, nonlabored ventilation, respiratory function stable and patient connected to nasal cannula oxygen Cardiovascular status: blood pressure returned to baseline and stable Postop Assessment: no signs of nausea or vomiting Anesthetic complications: no    Last Vitals:  Vitals:   08/11/17 0811 08/11/17 1100  BP: (!) 164/106 (!) 163/97  Pulse: 72 71  Resp: (!) 22 (!) 25  Temp: 36.7 C 36.5 C  SpO2:  94%    Last Pain:  Vitals:   08/11/17 1141  TempSrc:   PainSc: [place] Sherilee Smotherman

## 2017-08-11 NOTE — Consult Note (Signed)
[facility] Nurse wound follow up Wound type: Removed Vac dressing to change; Surgical PA Will is at the bedside to assess wound appearance during the first post-op dressing change.  2 areas are bleeding small amt; one at 6:00 o'clock and another at 3:00 o'clock.  Pressure held and dry dressing applied; plan to re-apply Vac dressing at 10:00 as requested.   Abd full thickness post-op wound; 19X6X4cm, beefy red.  Pt medicated for pain prior to the procedure. Julien Girt MSN, RN, [place], [place], [place]

## 2017-08-11 NOTE — Progress Notes (Signed)
2 Days Post-Op    CC: Abdominal pain   Subjective: She looks good this AM, up to the side of the bed.  Had a BM this AM.  Overall doing well.  Objective: Vital signs in last 24 hours: Temp:  [98 F (36.7 C)-98.3 F (36.8 C)] 98.1 F (36.7 C) (08/29 0343) Pulse Rate:  [77-95] 79 (08/29 0343) Resp:  [14-19] 14 (08/29 0343) BP: (130-175)/(70-97) 175/95 (08/29 0343) SpO2:  [93 %-98 %] 98 % (08/29 0343)   NPO 2400 IV 1350 urine 90 drain Afebrile, BP up some, VSS No labs this AM  Intake/Output from previous day: 08/28 0701 - 08/29 0700 In: 2400 [I.V.:2200; IV Piggyback:200] Out: 1440 [Urine:1350; Drains:90] Intake/Output this shift: No intake/output data recorded.  General appearance: alert, cooperative and no distress Resp: clear to auscultation bilaterally GI: soft, sore, wound vac in place.  drainage is serous.  + BS and BM this AM.  Lab Results:   Recent Labs  08/09/17 1820 08/10/17 0314  WBC 14.3* 15.0*  HGB 15.4* 14.1  HCT 46.7* 43.5  PLT 240 213    BMET  Recent Labs  08/09/17 1820 08/10/17 0314  NA 138 139  K 3.3* 3.9  CL 100* 101  CO2 26 28  GLUCOSE 118* 123*  BUN 11 10  CREATININE 0.86 0.84  CALCIUM 9.4 8.7*   PT/INR No results for input(s): LABPROT, INR in the last 72 hours.   Recent Labs Lab 08/09/17 1820  AST 27  ALT 15  ALKPHOS 108  BILITOT 0.4  PROT 8.0  ALBUMIN 4.0     Lipase     Component Value Date/Time   LIPASE 22 08/09/2017 1820     Medications: . enoxaparin (LOVENOX) injection  40 mg Subcutaneous Q24H  . FLUoxetine  60 mg Oral Daily  . pantoprazole (PROTONIX) IV  40 mg Intravenous QHS  . pneumococcal 23 valent vaccine  0.5 mL Intramuscular Tomorrow-1000    Assessment/Plan Incarcerated  incisional Ventral Hernia Exploratory laparotomy, primary repair of incarcerated ventral hernia, Wound vac placement, 08/10/17, Dr. Rolm Bookbinder  POD #1 Fibromyalgia Multiple Sclerosis Arthritis Body mass index is  42.9 FEN:  IV fluids/NPO-ice chips ID:  Zosyn 08/09/17 preop only DVT:  Lovenox/SCD   Plan:  Wound vac change today, dc foley, start her on a diet and advance.  I think she can go to the floor.  Will review with Dr. Hulen Skains before transfering.     LOS: 2 days    Sherlock Nancarrow 08/11/2017 [phone]

## 2017-08-11 NOTE — Progress Notes (Signed)
Still bleeding from skin sites, one has stopped but the second has not.  Silver nitrate and pressure not working so far.  Will persist, getting more supplies from various sites in hospital.  Nothing available here on the floor.    7 ml 1% lidocaine with epi instilled in site medial left side.   1 2-0 chromic stitch used and bleeding was controlled.  Gelfoam, and Wet to dry dressing applied.  We will put the wound vac on tomorrow.

## 2017-08-11 NOTE — Evaluation (Signed)
Physical Therapy Evaluation Patient Details Name: Megan Dixon MRN: [id number] DOB: [birth year] Today's Date: 08/11/2017   History of Present Illness  Pt admit with Incarcerated  incisional Ventral Hernia.  Exploratory laparotomy, primary repair of incarcerated ventral hernia, Wound vac placement, 08/10/17, Dr. Rolm Bookbinder  POD #1.  Past Medical History:  Diagnosis Date  . Arthritis   . Fibromyalgia   . MS (multiple sclerosis) ([place])     Clinical Impression  Pt admitted with above diagnosis. Pt currently with functional limitations due to the deficits listed below (see PT Problem List). Pt was able to ambulate with RW on unit with overall good stability.  Will follow acutely.  VSS.   Pt will benefit from skilled PT to increase their independence and safety with mobility to allow discharge to the venue listed below.    Follow Up Recommendations Home health PT    Equipment Recommendations  Rolling walker with 5" wheels    Recommendations for Other Services       Precautions / Restrictions Precautions Precautions: Fall Restrictions Weight Bearing Restrictions: No      Mobility  Bed Mobility Overal bed mobility: Independent                Transfers Overall transfer level: Independent                  Ambulation/Gait Ambulation/Gait assistance: Min guard Ambulation Distance (Feet): 200 Feet Assistive device: Rolling walker (2 wheeled) Gait Pattern/deviations: Step-through pattern;Decreased stride length   Gait velocity interpretation: Below normal speed for age/gender General Gait Details: Overall good safety with RW.   Stairs            Wheelchair Mobility    Modified Rankin (Stroke Patients Only)       Balance Overall balance assessment: Needs assistance Sitting-balance support: No upper extremity supported;Feet supported Sitting balance-Leahy Scale: Good     Standing balance support: Bilateral upper extremity supported;During  functional activity Standing balance-Leahy Scale: Poor Standing balance comment: relies on RW for support                              Pertinent Vitals/Pain Pain Assessment: No/denies pain    Home Living Family/patient expects to be discharged to:: Private residence Living Arrangements: Children;Alone Available Help at Discharge: Family;Available 24 hours/day Type of Home: House Home Access: Stairs to enter Entrance Stairs-Rails: None Entrance Stairs-Number of Steps: 1 Home Layout: One level Home Equipment: Cane - single point      Prior Function Level of Independence: Independent;Independent with assistive device(s)         Comments: used cane at times     Hand Dominance        Extremity/Trunk Assessment   Upper Extremity Assessment Upper Extremity Assessment: Defer to OT evaluation    Lower Extremity Assessment Lower Extremity Assessment: Generalized weakness    Cervical / Trunk Assessment Cervical / Trunk Assessment: Normal  Communication   Communication: No difficulties  Cognition Arousal/Alertness: Awake/alert Behavior During Therapy: WFL for tasks assessed/performed Overall Cognitive Status: Within Functional Limits for tasks assessed                                        General Comments      Exercises General Exercises - Lower Extremity Ankle Circles/Pumps: AROM;Both;10 reps;Supine Heel Slides: AROM;Both;10 reps;Supine   Assessment/Plan  PT Assessment Patient needs continued PT services  PT Problem List Decreased activity tolerance;Decreased balance;Decreased mobility;Decreased knowledge of use of DME;Decreased safety awareness;Decreased knowledge of precautions;Obesity       PT Treatment Interventions DME instruction;Functional mobility training;Therapeutic activities;Therapeutic exercise;Balance training;Patient/family education;Stair training;Gait training    PT Goals (Current goals can be found in the  Care Plan section)  Acute Rehab PT Goals Patient Stated Goal: to get better PT Goal Formulation: With patient Time For Goal Achievement: 08/25/17 Potential to Achieve Goals: Good    Frequency Min 3X/week   Barriers to discharge        Co-evaluation               AM-PAC PT "6 Clicks" Daily Activity  Outcome Measure Difficulty turning over in bed (including adjusting bedclothes, sheets and blankets)?: None Difficulty moving from lying on back to sitting on the side of the bed? : None Difficulty sitting down on and standing up from a chair with arms (e.g., wheelchair, bedside commode, etc,.)?: A Little Help needed moving to and from a bed to chair (including a wheelchair)?: A Little Help needed walking in hospital room?: A Little Help needed climbing 3-5 steps with a railing? : Total 6 Click Score: 18    End of Session Equipment Utilized During Treatment: Gait belt Activity Tolerance: Patient limited by fatigue Patient left: in chair;with call bell/phone within reach;with family/visitor present Nurse Communication: Mobility status PT Visit Diagnosis: Unsteadiness on feet (R26.81);Muscle weakness (generalized) (M62.81)    Time: 3976-7341 PT Time Calculation (min) (ACUTE ONLY): 17 min   Charges:   PT Evaluation $PT Eval Moderate Complexity: 1 Mod     PT G Codes:        Heberto Sturdevant,PT Acute Rehabilitation [phone] (pager)   Denice Paradise 08/11/2017, 1:56 PM

## 2017-08-11 NOTE — Progress Notes (Signed)
PT Cancellation Note  Patient Details Name: Nancy Arvin MRN: [id number] DOB: [birth year]   Cancelled Treatment:    Reason Eval/Treat Not Completed: Medical issues which prohibited therapy (Pt actively bleeding and Dora nurse in room.  Will reattempt as able.)  Thanks.     Godfrey Pick Natha Guin 08/11/2017, 10:25 AM  [facility] Acute Rehabilitation [phone] (pager)

## 2017-08-11 NOTE — Evaluation (Signed)
Occupational Therapy Evaluation Patient Details Name: Megan Dixon Full MRN: [id number] DOB: [birth year] Today's Date: 08/11/2017    History of Present Illness Pt admit with Incarcerated  incisional Ventral Hernia.  Exploratory laparotomy, primary repair of incarcerated ventral hernia, Wound vac placement, 08/10/17, Dr. Rolm Bookbinder  POD #1.   Clinical Impression   PTA Pt independent in ADL and mobility (SPC PRN). Pt is currently max A for LB ADL due to pain and pressure in abdomen, and supervision for functional transfers. Pt very motivated to regain PLOF, pleasant and willing to work through pain. Pt will benefit from skilled OT in the acute setting to maximize safety and independence in ADL. Next session should focus on providing AE kit and educating on use of AE for LB ADL as well as energy conservation strategies.     Follow Up Recommendations  Supervision/Assistance - 24 hour;No OT follow up    Equipment Recommendations  3 in 1 bedside commode;Other (comment) (AE kit)    Recommendations for Other Services       Precautions / Restrictions Precautions Precautions: Fall Restrictions Weight Bearing Restrictions: No      Mobility Bed Mobility Overal bed mobility: Independent             General bed mobility comments: Pt sitting OOB in recliner when OT entered  Transfers Overall transfer level: Independent                    Balance Overall balance assessment: Needs assistance Sitting-balance support: No upper extremity supported;Feet supported Sitting balance-Leahy Scale: Good     Standing balance support: Bilateral upper extremity supported;During functional activity Standing balance-Leahy Scale: Poor Standing balance comment: relies on RW for support                            ADL either performed or assessed with clinical judgement   ADL Overall ADL's : Needs assistance/impaired Eating/Feeding: NPO   Grooming: Min guard;Standing    Upper Body Bathing: Moderate assistance;Sitting   Lower Body Bathing: Maximal assistance;Sit to/from stand   Upper Body Dressing : Set up;Sitting   Lower Body Dressing: Moderate assistance;Sit to/from stand Lower Body Dressing Details (indicate cue type and reason): Pt requires assist for socks and shoes able to perform from the knees up Toilet Transfer: Supervision/safety;Ambulation;RW Toilet Transfer Details (indicate cue type and reason): vc for safe hand placement Toileting- Clothing Manipulation and Hygiene: Moderate assistance;Sit to/from stand   Tub/ Banker: Min guard   Functional mobility during ADLs: Supervision/safety;Rolling walker General ADL Comments: pain impacts ability to reach LB     Vision Patient Visual Report: No change from baseline Vision Assessment?: No apparent visual deficits     Perception     Praxis      Pertinent Vitals/Pain Pain Assessment: 0-10 Pain Score: 8  Pain Location: abdomen Pain Descriptors / Indicators: Pressure;Discomfort Pain Intervention(s): Monitored during session;Repositioned     Hand Dominance Right   Extremity/Trunk Assessment Upper Extremity Assessment Upper Extremity Assessment: Overall WFL for tasks assessed   Lower Extremity Assessment Lower Extremity Assessment: Defer to PT evaluation   Cervical / Trunk Assessment Cervical / Trunk Assessment: Normal   Communication Communication Communication: No difficulties   Cognition Arousal/Alertness: Awake/alert Behavior During Therapy: WFL for tasks assessed/performed Overall Cognitive Status: Within Functional Limits for tasks assessed  General Comments  Pt's daughter present during session    Exercises Exercises: General Lower Extremity General Exercises - Lower Extremity Ankle Circles/Pumps: AROM;Both;10 reps;Supine Heel Slides: AROM;Both;10 reps;Supine   Shoulder Instructions      Home Living  Family/patient expects to be discharged to:: Private residence Living Arrangements: Children;Alone Available Help at Discharge: Family;Available 24 hours/day Type of Home: House Home Access: Stairs to enter [employer] of Steps: 1 Entrance Stairs-Rails: None Home Layout: One level     Bathroom Shower/Tub: Teacher, early years/pre: Standard     Home Equipment: Cane - single point   Additional Comments: 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      Prior Functioning/Environment Level of Independence: Independent;Independent with assistive device(s)        Comments: used cane at times        OT Problem List: Decreased range of motion;Decreased activity tolerance;Impaired balance (sitting and/or standing);Decreased safety awareness;Decreased knowledge of use of DME or AE;Obesity;Pain      OT Treatment/Interventions: Self-care/ADL training;Energy conservation;Therapeutic activities;Patient/family education;Balance training    OT Goals(Current goals can be found in the care plan section) Acute Rehab OT Goals Patient Stated Goal: to get better OT Goal Formulation: With patient/family Time For Goal Achievement: 08/25/17 Potential to Achieve Goals: Good ADL Goals Pt Will Perform Grooming: standing;with modified independence Pt Will Perform Lower Body Bathing: with set-up;with adaptive equipment;sit to/from stand Pt Will Perform Lower Body Dressing: with set-up;with adaptive equipment;sit to/from stand Pt Will Perform Tub/Shower Transfer: Tub transfer;with supervision;ambulating;3 in 1;rolling walker Additional ADL Goal #1: Pt will recall 3 ways to conserve energy during ADL with no verbal cues  OT Frequency: Min 3X/week   Barriers to D/C:            Co-evaluation              AM-PAC PT "6 Clicks" Daily Activity     Outcome Measure Help from another person eating meals?: Total Help from another person taking care  of personal grooming?: A Little Help from another person toileting, which includes using toliet, bedpan, or urinal?: A Little Help from another person bathing (including washing, rinsing, drying)?: A Lot Help from another person to put on and taking off regular upper body clothing?: None Help from another person to put on and taking off regular lower body clothing?: A Lot 6 Click Score: 15   End of Session Equipment Utilized During Treatment: Rolling walker Nurse Communication: Mobility status  Activity Tolerance: Patient tolerated treatment well Patient left: in chair;with call bell/phone within reach;with family/visitor present  OT Visit Diagnosis: Unsteadiness on feet (R26.81);Pain Pain - Right/Left: Right Pain - part of body:  (stomach/abdomen)                Time: 8127-5170 OT Time Calculation (min): 31 min Charges:  OT General Charges $OT Visit: 1 Visit OT Evaluation $OT Eval Moderate Complexity: 1 Mod OT Treatments $Self Care/Home Management : 8-22 mins G-Codes:     Hulda Humphrey OTR/L [place] 08/11/2017, 3:15 PM

## 2017-08-11 NOTE — Consult Note (Addendum)
Leslie Nurse wound follow up 10:00: Returned to re-apply Vac dresssing to abd wound as requested by the surgical team.  Both sites previously noted began bleeding mod amt again when dressing removed.  Called Will, PA and silver nitrate applied.  Refer to surgical team notes.  Vac held as requested and will attempt to apply on Thursday. Julien Girt MSN, RN, [place], [place], [place]

## 2017-08-12 NOTE — Progress Notes (Addendum)
Occupational Therapy Treatment Patient Details Name: Megan Dixon MRN: [id number] DOB: 1966-11-14 Today's Date: 08/12/2017    History of present illness Pt admit with Incarcerated  incisional Ventral Hernia.  Exploratory laparotomy, primary repair of incarcerated ventral hernia, Wound vac placement, 08/10/17, Dr. Rolm Bookbinder  POD #1.   OT comments  Pt making good progress towards OT goals this session. Pt was able to perform transfers bed > BSC > recliner at min guard (due to line management) level with RW. Pt able to perform toileting and peri care with set up and no UE during peri care. Session really focused on AE education. AE kit was provided, each tool and its use described in detail and then the Pt practiced with each tool demonstrating understanding. Pt was very excited and grateful for equipment and stated that "it will help with the pain so much". OT will continue to follow in the acute setting with next session to focus on tub transfer with 3 in 1 education.   Follow Up Recommendations  Supervision/Assistance - 24 hour;No OT follow up    Equipment Recommendations  3 in 1 bedside commode;Other (comment) (AE kit provided this session)    Recommendations for Other Services      Precautions / Restrictions Precautions Precautions: Fall Precaution Comments: abdominal wound vac Restrictions Weight Bearing Restrictions: No       Mobility Bed Mobility Overal bed mobility: Independent                Transfers Overall transfer level: Independent Equipment used: Rolling walker (2 wheeled)             General transfer comment: vc for safe hand placement    Balance Overall balance assessment: Needs assistance Sitting-balance support: No upper extremity supported;Feet supported Sitting balance-Leahy Scale: Good Sitting balance - Comments: sitting EOB for using AE to don socks   Standing balance support: Bilateral upper extremity supported;During functional  activity;No upper extremity supported Standing balance-Leahy Scale: Fair Standing balance comment: able to stand without UE support for peri care                           ADL either performed or assessed with clinical judgement   ADL Overall ADL's : Needs assistance/impaired     Grooming: Set up;Sitting;Wash/dry hands   Upper Body Bathing: Min guard;With adaptive equipment;Sitting Upper Body Bathing Details (indicate cue type and reason): Pt educated in use of long handle sponge Lower Body Bathing: Min guard;With adaptive equipment;Sit to/from stand Lower Body Bathing Details (indicate cue type and reason): Pt educated in use of long handle sponge     Lower Body Dressing: Min guard;With adaptive equipment;Sit to/from stand Lower Body Dressing Details (indicate cue type and reason): Pt edcuated and demonstrated understanding of using AE kit for LB dressing (grabber/reacher, sock aide, long handle shoe horn) Toilet Transfer: Min guard;Ambulation;RW;BSC Toilet Transfer Details (indicate cue type and reason): min guard for line management Toileting- Clothing Manipulation and Hygiene: Min guard;Sit to/from stand Toileting - Clothing Manipulation Details (indicate cue type and reason): Pt able to perform rear and front peri care     Functional mobility during ADLs: Min guard;Rolling walker (for line management)       Vision       Perception     Praxis      Cognition Arousal/Alertness: Awake/alert Behavior During Therapy: WFL for tasks assessed/performed Overall Cognitive Status: Within Functional Limits for tasks assessed  Exercises     Shoulder Instructions       General Comments      Pertinent Vitals/ Pain       Pain Assessment: 0-10 Pain Score: 5  Pain Location: abdomen Pain Descriptors / Indicators: Pressure;Discomfort Pain Intervention(s): Premedicated before session;Repositioned;Monitored  during session  Home Living                                          Prior Functioning/Environment              Frequency  Min 3X/week        Progress Toward Goals  OT Goals(current goals can now be found in the care plan section)  Progress towards OT goals: Progressing toward goals  Acute Rehab OT Goals Patient Stated Goal: to get better OT Goal Formulation: With patient Time For Goal Achievement: 08/25/17 Potential to Achieve Goals: Good  Plan Discharge plan remains appropriate;Frequency remains appropriate    Co-evaluation                 AM-PAC PT "6 Clicks" Daily Activity     Outcome Measure   Help from another person eating meals?: None Help from another person taking care of personal grooming?: A Little Help from another person toileting, which includes using toliet, bedpan, or urinal?: A Little Help from another person bathing (including washing, rinsing, drying)?: A Little Help from another person to put on and taking off regular upper body clothing?: None Help from another person to put on and taking off regular lower body clothing?: A Little 6 Click Score: 20    End of Session Equipment Utilized During Treatment: Rolling walker  OT Visit Diagnosis: Unsteadiness on feet (R26.81);Pain Pain - Right/Left: Right Pain - part of body:  (abdomen)   Activity Tolerance Patient tolerated treatment well   Patient Left in chair;with call bell/phone within reach   Nurse Communication Mobility status;Other (comment) (O2 monitor came off during session)        Time: 4193-7902 OT Time Calculation (min): 29 min  Charges: OT General Charges $OT Visit: 1 Visit OT Treatments $Self Care/Home Management : 23-37 mins  Hulda Humphrey OTR/L [place] 08/12/2017, 11:24 AM   Addendum: updated charges 2 units of self-care

## 2017-08-12 NOTE — Progress Notes (Signed)
CCS/Megan Dixon Progress Note 3 Days Post-Op  Subjective: Patient looks great.  Sittingup in the chair.  Two BMs yesterday.  Objective: Vital signs in last 24 hours: Temp:  [97.7 F (36.5 C)-98.7 F (37.1 C)] 98.4 F (36.9 C) (08/30 0736) Pulse Rate:  [64-73] 66 (08/30 0736) Resp:  [17-25] 18 (08/30 0736) BP: (152-175)/(82-102) 175/82 (08/30 0736) SpO2:  [94 %-100 %] 100 % (08/30 0736) Last BM Date: 08/11/17  Intake/Output from previous day: 08/29 0701 - 08/30 0700 In: 2640 [P.O.:240; I.V.:2400] Out: 1675 [Urine:1675] Intake/Output this shift: Total I/O In: 480 [P.O.:480] Out: 350 [Urine:350]  General: No distress.  NPWD in place  Lungs: Clear  Abd: soft, good bowel sounds.  Extremities: Intact.  No clinical signs or symptoms of DVT  Neuro: Intact  Lab Results:  @LABLAST2 (wbc:2,hgb:2,hct:2,plt:2) BMET ) Recent Labs  08/09/17 1820 08/10/17 0314  NA 138 139  K 3.3* 3.9  CL 100* 101  CO2 26 28  GLUCOSE 118* 123*  BUN 11 10  CREATININE 0.86 0.84  CALCIUM 9.4 8.7*   PT/INR No results for input(s): LABPROT, INR in the last 72 hours. ABG No results for input(s): PHART, HCO3 in the last 72 hours.  Invalid input(s): PCO2, PO2  Studies/Results: No results found.  Anti-infectives: Anti-infectives    Start     Dose/Rate Route Frequency Ordered Stop   08/09/17 2115  piperacillin-tazobactam (ZOSYN) IVPB 3.375 g     3.375 g 100 mL/hr over 30 Minutes Intravenous  Once 08/09/17 2111 08/09/17 2301      Assessment/Plan: s/p Procedure(s): Incarcerated ventral hernia repair with mesh  Plan for discharge tomorrow [place] IVF    LOS: 3 days   Megan Dixon. Megan Bailiff, MD, FACS [phone] [facility] 08/12/2017

## 2017-08-12 NOTE — Care Management Note (Addendum)
Case Management Note  Patient Details  Name: Megan Dixon MRN: [id number] Date of Birth: Jun 28, 1966  Subjective/Objective:   Pt is now s/p incarcerated ventral hernia repair with mesh                 Action/Plan:  PTA pt was completely independent from home and daughter will stay with pt at discharge - pt is a perm resident of [place].  CM offered choice for DME recommended and pt chose [facility] for 3:1 and RW (referral accepted by [facility]).  Surgeon chose [employer] as wound vac equipment.  CM provided [facility] list so pt can review with family to determine [facility] agency of choice - CM to follow back up.  CM provided [employer] application form on shadow chart - surgeon informed and will complete chart ASAP.  CM contacted [employer] rep and informed of referral - CM will need to fax completed form back to [facility] with [employer] at [phone] and then follow up with liaison to ensure process is complete prior to discharge.  [employer] informed that discharge will likely be 08/13/17.  CM requested [place] orders and face to face.   Expected Discharge Date:                  Expected Discharge Plan:  [place]  In-House Referral:     Discharge planning Services  CM Consult  Post Acute Care Choice:  Durable Medical Equipment Choice offered to:  Patient  DME Arranged:  3-N-1, Walker rolling DME Agency:  [place] [employer] ([employer] wound vac, 3:1 and RW from [facility])  HH Arranged:    [facility] Agency:     Status of Service:  In process, will continue to follow  If discussed at Long Length of Stay Meetings, dates discussed:    Additional Comments:  Maryclare Labrador, RN 08/12/2017, 2:03 PM

## 2017-08-12 NOTE — Progress Notes (Signed)
Physical Therapy Treatment Patient Details Name: Megan Dixon MRN: [id number] DOB: 1966/05/15 Today's Date: 08/12/2017    History of Present Illness Pt admit with Incarcerated  incisional Ventral Hernia.  Exploratory laparotomy, primary repair of incarcerated ventral hernia, Wound vac placement, 08/10/17, Dr. Rolm Bookbinder  POD #1.    PT Comments    Pt admitted with above diagnosis. Pt currently with functional limitations due to balance and endurance deficits. Pt was able to ambulate with RW and progress distance.  Doing well.   Pt will benefit from skilled PT to increase their independence and safety with mobility to allow discharge to the venue listed below.     Follow Up Recommendations  Home health PT     Equipment Recommendations  Rolling walker with 5" wheels    Recommendations for Other Services       Precautions / Restrictions Precautions Precautions: Fall Precaution Comments: abdominal wound vac Restrictions Weight Bearing Restrictions: No    Mobility  Bed Mobility Overal bed mobility: Independent             General bed mobility comments: Pt sitting OOB in recliner when OT entered  Transfers Overall transfer level: Independent Equipment used: Rolling walker (2 wheeled)             General transfer comment: vc for safe hand placement  Ambulation/Gait Ambulation/Gait assistance: Min guard Ambulation Distance (Feet): 400 Feet Assistive device: Rolling walker (2 wheeled) Gait Pattern/deviations: Step-through pattern;Decreased stride length   Gait velocity interpretation: Below normal speed for age/gender General Gait Details: Overall good safety with RW.    Stairs            Wheelchair Mobility    Modified Rankin (Stroke Patients Only)       Balance Overall balance assessment: Needs assistance Sitting-balance support: No upper extremity supported;Feet supported Sitting balance-Leahy Scale: Good Sitting balance - Comments:  sitting EOB for using AE to don socks   Standing balance support: Bilateral upper extremity supported;During functional activity;No upper extremity supported Standing balance-Leahy Scale: Fair Standing balance comment: able to stand without UE support              High level balance activites: Direction changes;Turns;Sudden stops High Level Balance Comments: supervision to min guard with challenges            Cognition Arousal/Alertness: Awake/alert Behavior During Therapy: WFL for tasks assessed/performed Overall Cognitive Status: Within Functional Limits for tasks assessed                                        Exercises General Exercises - Lower Extremity Ankle Circles/Pumps: AROM;Both;10 reps;Supine Long Arc Quad: AROM;Both;10 reps;Seated    General Comments        Pertinent Vitals/Pain Pain Assessment: 0-10 Pain Score: 5  Pain Location: abdomen Pain Descriptors / Indicators: Pressure;Discomfort Pain Intervention(s): Limited activity within patient's tolerance;Monitored during session;Premedicated before session;Repositioned  VSS  Home Living                      Prior Function            PT Goals (current goals can now be found in the care plan section) Acute Rehab PT Goals Patient Stated Goal: to get better Progress towards PT goals: Progressing toward goals    Frequency    Min 3X/week      PT Plan Current plan remains  appropriate    Co-evaluation              AM-PAC PT "6 Clicks" Daily Activity  Outcome Measure  Difficulty turning over in bed (including adjusting bedclothes, sheets and blankets)?: None Difficulty moving from lying on back to sitting on the side of the bed? : None Difficulty sitting down on and standing up from a chair with arms (e.g., wheelchair, bedside commode, etc,.)?: None Help needed moving to and from a bed to chair (including a wheelchair)?: A Little Help needed walking in hospital  room?: A Little Help needed climbing 3-5 steps with a railing? : Total 6 Click Score: 19    End of Session Equipment Utilized During Treatment: Gait belt Activity Tolerance: Patient tolerated treatment well Patient left: in chair;with call bell/phone within reach Nurse Communication: Mobility status PT Visit Diagnosis: Unsteadiness on feet (R26.81);Muscle weakness (generalized) (M62.81)     Time: 3007-6226 PT Time Calculation (min) (ACUTE ONLY): 19 min  Charges:  $Gait Training: 8-22 mins                    G Codes:       [place] [phone] (pager)    Denice Paradise 08/12/2017, 1:19 PM

## 2017-08-12 NOTE — Consult Note (Addendum)
[place] Nurse wound follow up Wound type: Abd with full thickness post-op wound.  No further bleeding to the location since yesterday. Measurement: See yesterday's notes for measurements. Wound bed: Beefy red with dark areas on wound edges where silver nitrate was used yesterday Drainage (amount, consistency, odor) small amt pink drainage, no odor Periwound: intact skin surrounding Dressing procedure/placement/frequency: Applied one piece black foam to 162mm cont suction.  Pt was medicated for pain prior to the procedure and tolerated with a mod amt discomfort.  Plan for bedside nurses to change  Vac dressing Q Tues/Thurs/Sat. Please re-consult if further assistance is needed.  Thank-you,  Julien Girt MSN, [place], [place], [place], [place]

## 2017-08-13 ENCOUNTER — Other Ambulatory Visit: Payer: Self-pay | Admitting: General Surgery

## 2017-08-13 MED ORDER — ONDANSETRON 4 MG PO TBDP: 4.0000 mg | ORAL_TABLET | Freq: Three times a day (TID) | ORAL | 0 refills | Status: DC | PRN

## 2017-08-13 MED ORDER — CODEINE SULFATE 15 MG PO TABS: 15.0000 mg | ORAL_TABLET | ORAL | 0 refills | Status: DC | PRN

## 2017-08-13 MED ORDER — PANTOPRAZOLE SODIUM 40 MG PO TBEC
40.0000 mg | DELAYED_RELEASE_TABLET | Freq: Every day | ORAL | Status: DC
Start: 2017-08-13 — End: 2017-08-13

## 2017-08-13 MED ORDER — HYDROCODONE-ACETAMINOPHEN 5-325 MG PO TABS: 1.0000 | ORAL_TABLET | Freq: Four times a day (QID) | ORAL | 0 refills | Status: DC | PRN

## 2017-08-13 NOTE — Discharge Instructions (Signed)
CCS      [facility] °[phone] ° °OPEN ABDOMINAL SURGERY: POST OP INSTRUCTIONS ° °Always review your discharge instruction sheet given to you by the facility where your surgery was performed. ° °IF YOU HAVE DISABILITY OR FAMILY LEAVE FORMS, YOU MUST BRING THEM TO THE OFFICE FOR PROCESSING.  PLEASE DO NOT GIVE THEM TO YOUR DOCTOR. ° °1. A prescription for pain medication may be given to you upon discharge.  Take your pain medication as prescribed, if needed.  If narcotic pain medicine is not needed, then you may take acetaminophen (Tylenol) or ibuprofen (Advil) as needed. °2. Take your usually prescribed medications unless otherwise directed. °3. If you need a refill on your pain medication, please contact your pharmacy. They will contact our office to request authorization.  Prescriptions will not be filled after 5pm or on week-ends. °4. You should follow a light diet the first few days after arrival home, such as soup and crackers, pudding, etc.unless your doctor has advised otherwise. A high-fiber, low fat diet can be resumed as tolerated.   Be sure to include lots of fluids daily. Most patients will experience some swelling and bruising on the chest and neck area.  Ice packs will help.  Swelling and bruising can take several days to resolve °5. Most patients will experience some swelling and bruising in the area of the incision. Ice pack will help. Swelling and bruising can take several days to resolve..  °6. It is common to experience some constipation if taking pain medication after surgery.  Increasing fluid intake and taking a stool softener will usually help or prevent this problem from occurring.  A mild laxative (Milk of Magnesia or Miralax) should be taken according to package directions if there are no bowel movements after 48 hours. °7.  You may have steri-strips (small skin tapes) in place directly over the incision.  These strips should be left on the skin for 7-10 days.  If your  surgeon used skin glue on the incision, you may shower in 24 hours.  The glue will flake off over the next 2-3 weeks.  Any sutures or staples will be removed at the office during your follow-up visit. You may find that a light gauze bandage over your incision may keep your staples from being rubbed or pulled. You may shower and replace the bandage daily. °8. ACTIVITIES:  You may resume regular (light) daily activities beginning the next day--such as daily self-care, walking, climbing stairs--gradually increasing activities as tolerated.  You may have sexual intercourse when it is comfortable.  Refrain from any heavy lifting or straining until approved by your doctor. °a. You may drive when you no longer are taking prescription pain medication, you can comfortably wear a seatbelt, and you can safely maneuver your car and apply brakes °b. Return to Work: ___________________________________ °9. You should see your doctor in the office for a follow-up appointment approximately two weeks after your surgery.  Make sure that you call for this appointment within a day or two after you arrive home to insure a convenient appointment time. °OTHER INSTRUCTIONS:  °_____________________________________________________________ °_____________________________________________________________ ° °WHEN TO CALL YOUR DOCTOR: °1. Fever over 101.0 °2. Inability to urinate °3. Nausea and/or vomiting °4. Extreme swelling or bruising °5. Continued bleeding from incision. °6. Increased pain, redness, or drainage from the incision. °7. Difficulty swallowing or breathing °8. Muscle cramping or spasms. °9. Numbness or tingling in hands or feet or around lips. ° °The clinic staff is available to   answer your questions during regular business hours.  Please dont hesitate to call and ask to speak to one of the nurses if you have concerns.  For further questions, please visit [email or web address]   Negative Pressure Wound Therapy What  is negative pressure wound therapy? Negative pressure wound therapy (NPWT) is a device that helps your wounds heal. NPWT helps your wound stay clean and healthy while it heals from the inside. NPWT uses a bandage (dressing) that is made of a sponge or gauze-like material. This dressing is placed on or inside the wound. The wound is then covered and sealed with a cover dressing that sticks to your skin (adhesive). This keeps air out. A tube connects the cover dressing to a small pump. The pump sucks fluid and germs from the wound. The pump also controls any odor coming from the wound. What are the benefits of NPWT? The benefits of NPWT may include:  Faster healing.  Lower risk of infection.  Decrease in swelling and how much fluid is in the wound.  Fewer dressing changes.  Ability to treat your wound at home.  Shorter hospital stay.  Less pain.  What are the risks of NPWT? NPWT is usually safe to use. The most common problem is skin irritation from the dressing adhesive, but there are many ways to help prevent this from happening. However, more serious problems can develop, such as:  Bleeding.  Infection.  Dehydration.  Pain.  What do I need to do to care for my wound?  Do not take off the dressing yourself unless told to do so by your health care provider.  Keep all follow-up visits as told by your health care provider. This is important.  Make sure you know how to change your dressing, if you will be doing this at home.  Keep the area clean and dry.  Ask your health care provider for the best way to protect your skin from becoming irritated by the adhesive. What do I need to know about the pump?  Do not turn off the pump yourself unless told to do so by your health care provider, such as for bathing.  Do not turn off the pump for more than two hours. If the pump is off for more than two hours, the dressing will need to be changed.  If your health care provider says it  is okay to shower: ? Do not take the pump into the shower. ? Make sure the wound dressing is protected and sealed. The wound area must stay dry.  Check frequently that the machine is on, that the machine indicates the therapy is on, and that all clamps are open.  If the alarm sounds: ? Stay calm. ? Do not turn off the pump or do anything with the dressing. ? Call your health care provider right away if you cannot fix the problem. The alarm may go off because the battery is low, the dressing has a leak, or the fluid collection container is full. ? Explain to your health care provider what is happening. Follow his or her instructions. When should I seek medical care? Seek medical care if:  You have new pain.  You develop irritation, a rash, or itching around the wound or dressing.  You see new black or yellow tissue in your wound.  The dressing changes are painful or cause bleeding.  The pump has been off for more than two hours and you do not know how to change the dressing.  The pump alarm goes off and you do not know what to do.  When should I seek immediate medical care? Seek immediate medical care if:  You have a lot of bleeding.  You see a sudden change in the color or texture of the drainage.  The wound breaks open.  You have severe pain.  You have signs of infection, such as: ? More redness, swelling, or pain. ? More fluid or blood. ? Warmth. ? Pus or a bad smell. ? Red streaks leading from wound. ? A fever.  This information is not intended to replace advice given to you by your health care provider. Make sure you discuss any questions you have with your health care provider. Document Released: 11/12/2008 Document Revised: 10/27/2016 Document Reviewed: 09/05/2015 Elsevier Interactive Patient Education  [employer].

## 2017-08-13 NOTE — Care Management (Signed)
Nancy Fetter Med 1 [phone] called [employer] VAC will be delivered to patient's hospital room today . Patient , bedside both aware. Text paged PA.   Magdalen Spatz RN BSN [phone]

## 2017-08-13 NOTE — Progress Notes (Signed)
Discharge instructions (including medications) discussed with and copy provided to patient/caregiver along with prescription. [employer] representative at bedside, delivered home wound vac. Bedside commode and rolling walker not delivered. Called Magdalen Spatz, RN, Case Manager. She requested we print order from [facility] to give to patient so that she can get the medical equipment. Will notify primary RN.

## 2017-08-13 NOTE — Progress Notes (Signed)
Subjective No acute events. Mild nausea this morning, otherwise feeling well.  Objective: Vital signs in last 24 hours: Temp:  [98.6 F (37 C)-98.9 F (37.2 C)] 98.6 F (37 C) (08/31 0606) Pulse Rate:  [59-62] 62 (08/31 0606) Resp:  [18-28] 20 (08/31 0606) BP: (150-194)/(83-105) 178/105 (08/31 0606) SpO2:  [97 %-100 %] 97 % (08/31 0606) Weight:  [107.6 kg (237 lb 4.8 oz)] 107.6 kg (237 lb 4.8 oz) (08/30 1802) Last BM Date: 08/12/17  Intake/Output from previous day: 08/30 0701 - 08/31 0700 In: 840 [P.O.:840] Out: 2350 [Urine:2250; Drains:100] Intake/Output this shift: Total I/O In: 240 [P.O.:240] Out: -   Gen: NAD, comfortable CV: RRR Pulm: Normal work of breathing Abd: Soft, NT/ND, WVAC in place Ext: SCDs in place  Assessment/Plan: Patient Active Problem List   Diagnosis Date Noted  . Incarcerated incisional hernia 08/10/2017  . S/P exploratory laparotomy 08/09/2017   s/p Procedure(s): Incarcerated ventral hernia repair with mesh  08/09/2017  -Anticipate discharge home later today if home health/VAC set up  LOS: 4 days   Ileana Roup, [facility]

## 2017-08-13 NOTE — Progress Notes (Signed)
Pt's BP at 177/87, asymptomatic. Will monitor pt.

## 2017-08-13 NOTE — Progress Notes (Addendum)
Spoke with Dr. Kieth Brightly. Stated he will send Rx for zofran.

## 2017-08-13 NOTE — Care Management Note (Addendum)
Case Management Note  Patient Details  Name: [name] MRN: [id number] Date of Birth: 05/04/1966  Subjective/Objective:                    Action/Plan:  [facility] can accept referral if OK with [facility] VAC changes will be Sunday, Wednesday Friday. Discussed with Will Creig Hines and he is in agreement.  Spoke to patient and daughter at bedside.   They would like [facility] for [name]. Left voice mail for [facility] with [facility] call back.   [facility] form completed and faxed to [facility] Expected Discharge Date:                  Expected Discharge Plan:  [place]  In-House Referral:     Discharge planning Services  CM Consult  Post Acute Care Choice:  Durable Medical Equipment Choice offered to:  Patient  DME Arranged:  3-N-1, Walker rolling DME Agency:  [place] ([facility] wound vac, 3:1 and RW from [facility])  HH Arranged:  RN, PT [facility] Agency:     Status of Service:  In process, will continue to follow  If discussed at Long Length of Stay Meetings, dates discussed:    Additional Comments:  Marilu Favre, RN 08/13/2017, 9:44 AM

## 2017-08-13 NOTE — Care Management Important Message (Signed)
Important Message  Patient Details  Name: Megan Dixon MRN: [id number] Date of Birth: [birth year]   Medicare Important Message Given:  Yes    Nathen May 08/13/2017, 9:39 AM

## 2017-08-13 NOTE — Progress Notes (Signed)
Spoke with [facility] representative. Need order or referral faxed so that patient can receive equipment.

## 2017-08-13 NOTE — Progress Notes (Signed)
Paged Dr. Hulen Skains for prescription for zofran per patient's request. Notified must call a different number for on call MD. On call MD paged.

## 2017-08-13 NOTE — Discharge Summary (Signed)
Physician Discharge Summary  Patient ID: Megan Dixon MRN: [id number] DOB/AGE: June 19, 1966 51 y.o.  Admit date: 08/09/2017 Discharge date: 08/13/2017  Discharge Diagnoses Incarcerated incisional hernia - s/p primary repair Fibromyalgia Multiple Sclerosis Arthritis BMI 42.9  Consultants WOC  Procedures Exploratory laparotomy , primary repair of incarcerated incisional ventral hernia - 08/09/17 Dr. Donne Hazel  HPI: 51 yo female who had prior history of ventral hernia repair likely with mesh in [place] in last couple years. She has history of ms, fibromyalgia, depression and htn.  She had abdominal pain last February and was noted on CT here to have incarcerated hernia with omentum.  She did not follow up with this and did not remember that. She had about 24 hours of abdominal pain and bulge that would not go away. Some nausea and emesis. Was having bms. No fevers. Nothing improved pain. She presented to [facility]. Patient was taken urgently to the OR and underwent above listed procedure.   Hospital Course: Patient was admitted to the general surgery service post-operatively. WOC consulted and VAC dressing changes started on POD#2. Foley was discontinued on POD#2. Patient did have some bleeding from skin site 8/29 and a single 2-0 chromic stitch was placed, bleeding was controlled after this. On day of discharge patient was tolerating diet, voiding appropriately, pain controlled, VSS. Patient was discharged to home in good condition. She will follow up with Dr. Donne Hazel post-operatively.     Allergies as of 08/13/2017      Reactions   Morphine And Related Other (See Comments)   Headache   Other Other (See Comments)   CANNOT EAT ANYTHING WITH SEEDS (DIVERTICULITIS)   Peanut-containing Drug Products Other (See Comments)   Has Diverticulitis    Toradol [ketorolac Tromethamine] Anxiety      Medication List    STOP taking these medications   dicyclomine 20 MG tablet Commonly known as:   BENTYL     TAKE these medications   codeine 15 MG tablet Take 1 tablet (15 mg total) by mouth every 4 (four) hours as needed for moderate pain or severe pain.   cyclobenzaprine 10 MG tablet Commonly known as:  FLEXERIL Take 10 mg by mouth 3 (three) times daily as needed for muscle spasms.   FLUoxetine 20 MG capsule Commonly known as:  PROZAC Take 60 mg by mouth daily.   gabapentin 600 MG tablet Commonly known as:  NEURONTIN Take 600 mg by mouth See admin instructions. ONE TO TWO TIMES A DAY   losartan-hydrochlorothiazide 100-25 MG tablet Commonly known as:  HYZAAR Take 1 tablet by mouth daily.   naproxen sodium 220 MG tablet Commonly known as:  ANAPROX Take 220-440 mg by mouth 2 (two) times daily as needed (for pain).   ondansetron 4 MG disintegrating tablet Commonly known as:  ZOFRAN ODT Take 1 tablet (4 mg total) by mouth every 8 (eight) hours as needed for nausea or vomiting.   ondansetron 4 MG tablet Commonly known as:  ZOFRAN Take 1 tablet (4 mg total) by mouth every 8 (eight) hours as needed for nausea or vomiting.   pantoprazole 40 MG tablet Commonly known as:  PROTONIX Take 40 mg by mouth daily.   polyethylene glycol packet Commonly known as:  MIRALAX Mix 8 cap fulls in 32 ounces of juice/water/gatorade and drink over the course of a day. Once your stool is loose or liquid, stop taking. What changed:  how much to take  how to take this  when to take this  reasons to take  this  additional instructions            Discharge Care Instructions        Start     Ordered   08/13/17 0000  codeine 15 MG tablet  Every 4 hours PRN    Question:  Supervising Provider  Answer:  Judeth Horn   08/13/17 1547   08/13/17 0000  ondansetron (ZOFRAN ODT) 4 MG disintegrating tablet  Every 8 hours PRN     08/13/17 1754       Follow-up [facility] Follow up.   Why:  bedside commode, rolling walker Contact information: [place] [address] [phone]        [facility] Follow up.   Contact information: Wound Vac Equipment   [phone]. [phone]  or utilize phone numbers given to you during equipment delivery       Rolm Bookbinder, MD Follow up.   Specialty:  General Surgery Why:  Office will with follow up appointment with Dr. Nat Christen information: [address] [address] [place] [address] [phone]           Signed: Brigid Re , [facility] 08/18/2017, 4:30 PM Pager: [phone] Mon-Fri 7:00 am-4:30 pm Sat-Sun 7:00 am-11:30 am

## 2017-08-15 DIAGNOSIS — Z48815 Encounter for surgical aftercare following surgery on the digestive system: Secondary | ICD-10-CM | POA: Diagnosis not present

## 2017-08-15 DIAGNOSIS — G35 Multiple sclerosis: Secondary | ICD-10-CM | POA: Diagnosis not present

## 2017-08-15 DIAGNOSIS — M1990 Unspecified osteoarthritis, unspecified site: Secondary | ICD-10-CM | POA: Diagnosis not present

## 2017-08-15 DIAGNOSIS — M797 Fibromyalgia: Secondary | ICD-10-CM | POA: Diagnosis not present

## 2017-08-16 DIAGNOSIS — G8929 Other chronic pain: Secondary | ICD-10-CM | POA: Insufficient documentation

## 2017-08-16 DIAGNOSIS — M545 Low back pain, unspecified: Secondary | ICD-10-CM | POA: Insufficient documentation

## 2017-08-16 DIAGNOSIS — K219 Gastro-esophageal reflux disease without esophagitis: Secondary | ICD-10-CM | POA: Insufficient documentation

## 2017-08-18 DIAGNOSIS — G35 Multiple sclerosis: Secondary | ICD-10-CM | POA: Diagnosis not present

## 2017-08-18 DIAGNOSIS — M797 Fibromyalgia: Secondary | ICD-10-CM | POA: Diagnosis not present

## 2017-08-18 DIAGNOSIS — M1990 Unspecified osteoarthritis, unspecified site: Secondary | ICD-10-CM | POA: Diagnosis not present

## 2017-08-18 DIAGNOSIS — Z48815 Encounter for surgical aftercare following surgery on the digestive system: Secondary | ICD-10-CM | POA: Diagnosis not present

## 2017-08-20 DIAGNOSIS — G35 Multiple sclerosis: Secondary | ICD-10-CM | POA: Diagnosis not present

## 2017-08-20 DIAGNOSIS — Z48815 Encounter for surgical aftercare following surgery on the digestive system: Secondary | ICD-10-CM | POA: Diagnosis not present

## 2017-08-20 DIAGNOSIS — M1990 Unspecified osteoarthritis, unspecified site: Secondary | ICD-10-CM | POA: Diagnosis not present

## 2017-08-20 DIAGNOSIS — M797 Fibromyalgia: Secondary | ICD-10-CM | POA: Diagnosis not present

## 2017-08-22 ENCOUNTER — Emergency Department (HOSPITAL_COMMUNITY)
Admission: EM | Admit: 2017-08-22 | Discharge: 2017-08-22 | Disposition: A | Discharge status: Home / Self Care | Payer: Medicare Other | Attending: Emergency Medicine | Admitting: Emergency Medicine

## 2017-08-22 ENCOUNTER — Encounter (HOSPITAL_COMMUNITY): Payer: Self-pay

## 2017-08-22 ENCOUNTER — Emergency Department (HOSPITAL_COMMUNITY): Payer: Medicare Other

## 2017-08-22 DIAGNOSIS — Z7983 Long term (current) use of bisphosphonates: Secondary | ICD-10-CM | POA: Diagnosis not present

## 2017-08-22 DIAGNOSIS — R1033 Periumbilical pain: Secondary | ICD-10-CM | POA: Diagnosis not present

## 2017-08-22 DIAGNOSIS — F17210 Nicotine dependence, cigarettes, uncomplicated: Secondary | ICD-10-CM | POA: Diagnosis not present

## 2017-08-22 DIAGNOSIS — Z79899 Other long term (current) drug therapy: Secondary | ICD-10-CM | POA: Diagnosis not present

## 2017-08-22 HISTORY — DX: Essential (primary) hypertension: I10

## 2017-08-22 LAB — CBC WITH DIFFERENTIAL/PLATELET
Basophils Absolute: 0 10*3/uL (ref 0.0–0.1)
Basophils Relative: 0 %
Eosinophils Absolute: 0.3 10*3/uL (ref 0.0–0.7)
Eosinophils Relative: 3 %
HCT: 38.9 % (ref 36.0–46.0)
Hemoglobin: 12.3 g/dL (ref 12.0–15.0)
Lymphocytes Relative: 41 %
Lymphs Abs: 3.8 10*3/uL (ref 0.7–4.0)
MCH: 28.5 pg (ref 26.0–34.0)
MCHC: 31.6 g/dL (ref 30.0–36.0)
MCV: 90 fL (ref 78.0–100.0)
Monocytes Absolute: 0.6 10*3/uL (ref 0.1–1.0)
Monocytes Relative: 7 %
Neutro Abs: 4.5 10*3/uL (ref 1.7–7.7)
Neutrophils Relative %: 49 %
Platelets: 226 10*3/uL (ref 150–400)
RBC: 4.32 MIL/uL (ref 3.87–5.11)
RDW: 14.6 % (ref 11.5–15.5)
WBC: 9.2 10*3/uL (ref 4.0–10.5)

## 2017-08-22 LAB — BASIC METABOLIC PANEL WITH GFR
Anion gap: 7 (ref 5–15)
BUN: 21 mg/dL — ABNORMAL HIGH (ref 6–20)
CO2: 27 mmol/L (ref 22–32)
Calcium: 8.8 mg/dL — ABNORMAL LOW (ref 8.9–10.3)
Chloride: 106 mmol/L (ref 101–111)
Creatinine, Ser: 0.86 mg/dL (ref 0.44–1.00)
GFR calc Af Amer: >60 mL/min
GFR calc non Af Amer: >60 mL/min
Glucose, Bld: 84 mg/dL (ref 65–99)
Potassium: 3.8 mmol/L (ref 3.5–5.1)
Sodium: 140 mmol/L (ref 135–145)

## 2017-08-22 MED ORDER — HYDROCODONE-ACETAMINOPHEN 5-325 MG PO TABS
1.0000 | ORAL_TABLET | Freq: Once | ORAL | Status: AC
  Administered 2017-08-22: 1 via ORAL
  Filled 2017-08-22: qty 1

## 2017-08-22 MED ORDER — IOPAMIDOL (ISOVUE-300) INJECTION 61%
INTRAVENOUS | Status: AC
  Administered 2017-08-22: 100 mL
  Filled 2017-08-22: qty 100

## 2017-08-22 NOTE — Discharge Instructions (Signed)
Please continue your home pain medicine and follow up with Dr. Donne Hazel tomorrow Return for worsening symptoms

## 2017-08-22 NOTE — ED Notes (Signed)
Patient requesting extra container for wound vac. Wound nurse paged and charge rn aware.

## 2017-08-22 NOTE — ED Provider Notes (Signed)
[facility] DEPT Provider Note   CSN: [id number] Arrival date & time: 08/22/17  0919     History   Chief Complaint Chief Complaint  Patient presents with  . Abdominal Pain    HPI Caylen Kuwahara is a 51 y.o. female who presents with worsening pain around surgical wound. PMH significant for incarcerated ventral hernia s/p repair on 8/27 by Dr. Donne Hazel. Her post-op hospital course was uncomplicated. She states the overall the past was getting better until last night. She has had worsening pain over the right side of her wound. She has a wound vac and the drainage has been clear up until last night when it started to be blood tinged. The pain and blood tinged drainage persisted today. She called her wound care nurse who recommended her to come to the ED. She denies fevers, N/V, and has been having regular BM.   HPI  Past Medical History:  Diagnosis Date  . Arthritis   . Fibromyalgia   . MS (multiple sclerosis) [facility])     Patient Active Problem List   Diagnosis Date Noted  . Incarcerated incisional hernia 08/10/2017  . S/P exploratory laparotomy 08/09/2017    Past Surgical History:  Procedure Laterality Date  . ABDOMINAL SURGERY    . HERNIA REPAIR    . JOINT REPLACEMENT    . VENTRAL HERNIA REPAIR N/A 08/09/2017   Procedure: Incarcerated ventral hernia repair with mesh ;  Surgeon: Rolm Bookbinder, MD;  Location: [facility];  Service: General;  Laterality: N/A;    OB History    No data available       Home Medications    Prior to Admission medications   Medication Sig Start Date End Date Taking? Authorizing Provider  codeine 15 MG tablet Take 1 tablet (15 mg total) by mouth every 4 (four) hours as needed for moderate pain or severe pain. 08/13/17   Rayburn, Floyce Stakes, PA-C  cyclobenzaprine (FLEXERIL) 10 MG tablet Take 10 mg by mouth 3 (three) times daily as needed for muscle spasms.    [provider]  FLUoxetine (PROZAC) 20 MG capsule Take 60 mg by mouth daily.     [provider]  gabapentin (NEURONTIN) 600 MG tablet Take 600 mg by mouth See admin instructions. ONE TO TWO TIMES A DAY    [provider]  HYDROcodone-acetaminophen (NORCO) 5-325 MG tablet Take 1 tablet by mouth every 6 (six) hours as needed for moderate pain. 08/13/17   Kinsinger, Arta Bruce, MD  losartan-hydrochlorothiazide (HYZAAR) 100-25 MG tablet Take 1 tablet by mouth daily.    [provider]  naproxen sodium (ANAPROX) 220 MG tablet Take 220-440 mg by mouth 2 (two) times daily as needed (for pain).    [provider]  ondansetron (ZOFRAN ODT) 4 MG disintegrating tablet Take 1 tablet (4 mg total) by mouth every 8 (eight) hours as needed for nausea or vomiting. 08/13/17   Kinsinger, Arta Bruce, MD  ondansetron (ZOFRAN) 4 MG tablet Take 1 tablet (4 mg total) by mouth every 8 (eight) hours as needed for nausea or vomiting. Patient not taking: Reported on 08/09/2017 02/04/17   Duffy Bruce, MD  pantoprazole (PROTONIX) 40 MG tablet Take 40 mg by mouth daily.    [provider]  polyethylene glycol (MIRALAX) packet Mix 8 cap fulls in 32 ounces of juice/water/gatorade and drink over the course of a day. Once your stool is loose or liquid, stop taking. Patient taking differently: Take 17 g by mouth daily as needed for  mild constipation.  02/04/17   Duffy Bruce, MD    Family History No family history on file.  Social History Social History  Substance Use Topics  . Smoking status: Current Every Day Smoker  . Smokeless tobacco: Never Used  . Alcohol use Yes     Allergies   Morphine and related; Other; Peanut-containing drug products; and Toradol [ketorolac tromethamine]   Review of Systems Review of Systems  Constitutional: Negative for fever.  Respiratory: Negative for shortness of breath.   Cardiovascular: Negative for chest pain.  Gastrointestinal: Positive for abdominal pain. Negative for constipation, diarrhea, nausea and vomiting.   Skin: Positive for wound.  All other systems reviewed and are negative.    Physical Exam Updated Vital Signs BP (!) 147/91   Pulse 84   Temp 99 F (37.2 C) (Oral)   Resp 16   SpO2 99%   Physical Exam  Constitutional: She is oriented to person, place, and time. She appears well-developed and well-nourished. No distress.  Calm, cooperative  HENT:  Head: Normocephalic and atraumatic.  Eyes: Pupils are equal, round, and reactive to light. Conjunctivae are normal. Right eye exhibits no discharge. Left eye exhibits no discharge. No scleral icterus.  Neck: Normal range of motion.  Cardiovascular: Normal rate.   Pulmonary/Chest: Effort normal. No respiratory distress.  Abdominal: Soft. Bowel sounds are normal. She exhibits no distension and no mass. There is tenderness (over right side of wound). There is no rebound and no guarding. No hernia.  Wound vac in place. Blood tinged drainage noted. Mild patchy redness around wound vac.  Neurological: She is alert and oriented to person, place, and time.  Skin: Skin is warm and dry.  Psychiatric: She has a normal mood and affect. Her behavior is normal.  Nursing note and vitals reviewed.    ED Treatments / Results  Labs (all labs ordered are listed, but only abnormal results are displayed) Labs Reviewed  BASIC METABOLIC PANEL - Abnormal; Notable for the following:       Result Value   BUN 21 (*)    Calcium 8.8 (*)    All other components within normal limits  CBC WITH DIFFERENTIAL/PLATELET  CBC WITH DIFFERENTIAL/PLATELET  CBC WITH DIFFERENTIAL/PLATELET  CBC WITH DIFFERENTIAL/PLATELET    EKG  EKG Interpretation None       Radiology Ct Abdomen Pelvis W Contrast  Result Date: 08/22/2017 CLINICAL DATA:  51 year old female with history of umbilical abdominal pain and right-sided abdominal pain since yesterday. Recent history of hernia surgery several weeks ago. EXAM: CT ABDOMEN AND PELVIS WITH CONTRAST TECHNIQUE: Multidetector  CT imaging of the abdomen and pelvis was performed using the standard protocol following bolus administration of intravenous contrast. CONTRAST:  < 100 mL of Isovue-300 > COMPARISON:  None. FINDINGS: Lower chest: Trace left pleural effusion lying dependently. Hepatobiliary: No cystic or solid hepatic lesions are identified. No intra or extrahepatic biliary ductal dilatation. Gallbladder is normal in appearance. Pancreas: No pancreatic mass. No pancreatic ductal dilatation. No pancreatic or peripancreatic fluid or inflammatory changes. Spleen: Unremarkable. Adrenals/Urinary Tract: Mild thickening of the left adrenal gland is unchanged. Right adrenal gland and bilateral kidneys are normal in appearance. No hydroureteronephrosis. Urinary bladder is normal in appearance. Stomach/Bowel: Normal appearance of the stomach. No pathologic dilatation of small bowel or colon. Numerous colonic diverticulae are noted, without surrounding inflammatory changes to suggest an acute diverticulitis at this time. The appendix is not confidently identified and may be surgically absent. Regardless, there are no inflammatory changes noted  adjacent to the cecum to suggest the presence of an acute appendicitis at this time. Vascular/Lymphatic: Aortic atherosclerosis, without evidence of aneurysm or dissection in the abdominal or pelvic vasculature. No lymphadenopathy noted in the abdomen or pelvis. Reproductive: Uterus and ovaries are unremarkable in appearance. Other: Status post surgical repair of large ventral hernia noted on the prior study. No evidence of recurrent herniation. There is a second smaller supraumbilical ventral hernia again noted, containing only a small amount of omental fat. No significant volume of ascites. No pneumoperitoneum. Musculoskeletal: There are no aggressive appearing lytic or blastic lesions noted in the visualized portions of the skeleton. IMPRESSION: 1. No acute findings noted in the abdomen or pelvis to  account for the patient's symptoms. 2. Status post ventral hernia repair, without evidence of recurrent hernia. There is a second smaller supraumbilical ventral hernia which is unchanged compared to the prior study containing only omental fat. 3. Aortic atherosclerosis. 4. Trace left pleural effusion. Electronically Signed   By: Vinnie Langton M.D.   On: 08/22/2017 15:51    Procedures Procedures (including critical care time)  Medications Ordered in ED Medications  iopamidol (ISOVUE-300) 61 % injection (100 mLs  Contrast Given 08/22/17 1502)  HYDROcodone-acetaminophen (NORCO/VICODIN) 5-325 MG per tablet 1 tablet (1 tablet Oral Given 08/22/17 1622)     Initial Impression / Assessment and Plan / ED Course  I have reviewed the triage vital signs and the nursing notes.  Pertinent labs & imaging results that were available during my care of the patient were reviewed by me and considered in my medical decision making (see chart for details).  51 year old female with post op pain and concern about her wound and drainage from wound vac. She is hypertensive but otherwise vitals are normal. Labs are normal. CT scan is normal. Shared visit with Dr. Rogene Houston. She has a f/u with Dr. Donne Hazel tomorrow. Will d/c with close follow up.  Final Clinical Impressions(s) / ED Diagnoses   Final diagnoses:  Periumbilical abdominal pain    New Prescriptions New Prescriptions   No medications on file     Iris Pert 08/22/17 1625    Fredia Sorrow, MD 08/25/17 2216

## 2017-08-22 NOTE — ED Provider Notes (Signed)
Medical screening examination/treatment/procedure(s) were conducted as a shared visit with non-physician practitioner(s) and myself.  I personally evaluated the patient during the encounter.   EKG Interpretation None       Patient status post incarcerated hernia surgery August 27 by Dr. Donne Hazel. Patient has a wound VAC in place with the open abdominal wound. Patient noted blood coming out through the tube with a wound VAC and was concerned that something was abnormal talk to her wound care nurse. Referred in the emergency department for evaluation. Patient also states she has increased pain on the right side of the wound in abdomen. We'll get labs and CT of the abdomen. Clinically is a serosanguineous fluid draining there is no evidence of any frank hemorrhage. Clinically patient does appear to be fairly stable but she's very anxious and nervous about the event so we'll go ahead and do the CT. Patient has follow-up with Dr. Donne Hazel already scheduled for tomorrow. Patient will most likely be stable for discharge home.  Abdomen has a little slight redness to the right side of the wound and this some mild tenderness in that area. Patient is nontoxic no acute distress.   Fredia Sorrow, MD 08/22/17 1054

## 2017-08-22 NOTE — ED Notes (Signed)
Attempted blood draw and Iv 2 unsuccessfully will consult IV team

## 2017-08-22 NOTE — ED Notes (Signed)
IV team unable to obtain labs. Will notify phlebotomy

## 2017-08-22 NOTE — ED Notes (Signed)
Patient transported to CT 

## 2017-08-22 NOTE — ED Triage Notes (Signed)
Patient had hernia repair surgery 8/27 and has wound vac to same. This am awoke with bloody drainage in tube and directed to ED. Reports increased pain with same

## 2017-08-23 DIAGNOSIS — G35 Multiple sclerosis: Secondary | ICD-10-CM | POA: Diagnosis not present

## 2017-08-23 DIAGNOSIS — M797 Fibromyalgia: Secondary | ICD-10-CM | POA: Diagnosis not present

## 2017-08-23 DIAGNOSIS — M1990 Unspecified osteoarthritis, unspecified site: Secondary | ICD-10-CM | POA: Diagnosis not present

## 2017-08-23 DIAGNOSIS — Z48815 Encounter for surgical aftercare following surgery on the digestive system: Secondary | ICD-10-CM | POA: Diagnosis not present

## 2017-08-25 DIAGNOSIS — M1990 Unspecified osteoarthritis, unspecified site: Secondary | ICD-10-CM | POA: Diagnosis not present

## 2017-08-25 DIAGNOSIS — M797 Fibromyalgia: Secondary | ICD-10-CM | POA: Diagnosis not present

## 2017-08-25 DIAGNOSIS — Z48815 Encounter for surgical aftercare following surgery on the digestive system: Secondary | ICD-10-CM | POA: Diagnosis not present

## 2017-08-25 DIAGNOSIS — I10 Essential (primary) hypertension: Secondary | ICD-10-CM | POA: Diagnosis not present

## 2017-08-25 DIAGNOSIS — G35 Multiple sclerosis: Secondary | ICD-10-CM | POA: Diagnosis not present

## 2017-08-25 DIAGNOSIS — Z86010 Personal history of colonic polyps: Secondary | ICD-10-CM | POA: Diagnosis not present

## 2017-08-25 DIAGNOSIS — G8918 Other acute postprocedural pain: Secondary | ICD-10-CM | POA: Diagnosis not present

## 2017-08-27 DIAGNOSIS — Z48815 Encounter for surgical aftercare following surgery on the digestive system: Secondary | ICD-10-CM | POA: Diagnosis not present

## 2017-08-27 DIAGNOSIS — M1990 Unspecified osteoarthritis, unspecified site: Secondary | ICD-10-CM | POA: Diagnosis not present

## 2017-08-27 DIAGNOSIS — M797 Fibromyalgia: Secondary | ICD-10-CM | POA: Diagnosis not present

## 2017-08-27 DIAGNOSIS — G35 Multiple sclerosis: Secondary | ICD-10-CM | POA: Diagnosis not present

## 2017-08-30 DIAGNOSIS — G35 Multiple sclerosis: Secondary | ICD-10-CM | POA: Diagnosis not present

## 2017-08-30 DIAGNOSIS — M1990 Unspecified osteoarthritis, unspecified site: Secondary | ICD-10-CM | POA: Diagnosis not present

## 2017-08-30 DIAGNOSIS — M797 Fibromyalgia: Secondary | ICD-10-CM | POA: Diagnosis not present

## 2017-08-30 DIAGNOSIS — Z48815 Encounter for surgical aftercare following surgery on the digestive system: Secondary | ICD-10-CM | POA: Diagnosis not present

## 2017-09-01 DIAGNOSIS — M797 Fibromyalgia: Secondary | ICD-10-CM | POA: Diagnosis not present

## 2017-09-01 DIAGNOSIS — Z48815 Encounter for surgical aftercare following surgery on the digestive system: Secondary | ICD-10-CM | POA: Diagnosis not present

## 2017-09-01 DIAGNOSIS — M1990 Unspecified osteoarthritis, unspecified site: Secondary | ICD-10-CM | POA: Diagnosis not present

## 2017-09-01 DIAGNOSIS — G35 Multiple sclerosis: Secondary | ICD-10-CM | POA: Diagnosis not present

## 2017-09-02 DIAGNOSIS — M1990 Unspecified osteoarthritis, unspecified site: Secondary | ICD-10-CM | POA: Diagnosis not present

## 2017-09-02 DIAGNOSIS — G35 Multiple sclerosis: Secondary | ICD-10-CM | POA: Diagnosis not present

## 2017-09-02 DIAGNOSIS — Z48815 Encounter for surgical aftercare following surgery on the digestive system: Secondary | ICD-10-CM | POA: Diagnosis not present

## 2017-09-02 DIAGNOSIS — M797 Fibromyalgia: Secondary | ICD-10-CM | POA: Diagnosis not present

## 2017-09-04 DIAGNOSIS — M797 Fibromyalgia: Secondary | ICD-10-CM | POA: Diagnosis not present

## 2017-09-04 DIAGNOSIS — M1990 Unspecified osteoarthritis, unspecified site: Secondary | ICD-10-CM | POA: Diagnosis not present

## 2017-09-04 DIAGNOSIS — Z48815 Encounter for surgical aftercare following surgery on the digestive system: Secondary | ICD-10-CM | POA: Diagnosis not present

## 2017-09-04 DIAGNOSIS — G35 Multiple sclerosis: Secondary | ICD-10-CM | POA: Diagnosis not present

## 2017-09-06 DIAGNOSIS — M1990 Unspecified osteoarthritis, unspecified site: Secondary | ICD-10-CM | POA: Diagnosis not present

## 2017-09-06 DIAGNOSIS — G35 Multiple sclerosis: Secondary | ICD-10-CM | POA: Diagnosis not present

## 2017-09-06 DIAGNOSIS — Z48815 Encounter for surgical aftercare following surgery on the digestive system: Secondary | ICD-10-CM | POA: Diagnosis not present

## 2017-09-06 DIAGNOSIS — M797 Fibromyalgia: Secondary | ICD-10-CM | POA: Diagnosis not present

## 2017-09-08 DIAGNOSIS — M797 Fibromyalgia: Secondary | ICD-10-CM | POA: Diagnosis not present

## 2017-09-08 DIAGNOSIS — Z48815 Encounter for surgical aftercare following surgery on the digestive system: Secondary | ICD-10-CM | POA: Diagnosis not present

## 2017-09-08 DIAGNOSIS — G35 Multiple sclerosis: Secondary | ICD-10-CM | POA: Diagnosis not present

## 2017-09-08 DIAGNOSIS — M1990 Unspecified osteoarthritis, unspecified site: Secondary | ICD-10-CM | POA: Diagnosis not present

## 2017-09-10 DIAGNOSIS — F321 Major depressive disorder, single episode, moderate: Secondary | ICD-10-CM | POA: Diagnosis not present

## 2017-09-10 DIAGNOSIS — Z86010 Personal history of colonic polyps: Secondary | ICD-10-CM | POA: Diagnosis not present

## 2017-09-10 DIAGNOSIS — M1990 Unspecified osteoarthritis, unspecified site: Secondary | ICD-10-CM | POA: Diagnosis not present

## 2017-09-10 DIAGNOSIS — I10 Essential (primary) hypertension: Secondary | ICD-10-CM | POA: Diagnosis not present

## 2017-09-10 DIAGNOSIS — Z48815 Encounter for surgical aftercare following surgery on the digestive system: Secondary | ICD-10-CM | POA: Diagnosis not present

## 2017-09-10 DIAGNOSIS — G8918 Other acute postprocedural pain: Secondary | ICD-10-CM | POA: Diagnosis not present

## 2017-09-10 DIAGNOSIS — G35 Multiple sclerosis: Secondary | ICD-10-CM | POA: Diagnosis not present

## 2017-09-10 DIAGNOSIS — M797 Fibromyalgia: Secondary | ICD-10-CM | POA: Diagnosis not present

## 2017-09-13 DIAGNOSIS — Z48815 Encounter for surgical aftercare following surgery on the digestive system: Secondary | ICD-10-CM | POA: Diagnosis not present

## 2017-09-13 DIAGNOSIS — M1990 Unspecified osteoarthritis, unspecified site: Secondary | ICD-10-CM | POA: Diagnosis not present

## 2017-09-13 DIAGNOSIS — M797 Fibromyalgia: Secondary | ICD-10-CM | POA: Diagnosis not present

## 2017-09-13 DIAGNOSIS — G35 Multiple sclerosis: Secondary | ICD-10-CM | POA: Diagnosis not present

## 2017-09-14 DIAGNOSIS — K432 Incisional hernia without obstruction or gangrene: Secondary | ICD-10-CM | POA: Diagnosis not present

## 2017-09-15 DIAGNOSIS — M797 Fibromyalgia: Secondary | ICD-10-CM | POA: Diagnosis not present

## 2017-09-15 DIAGNOSIS — M1990 Unspecified osteoarthritis, unspecified site: Secondary | ICD-10-CM | POA: Diagnosis not present

## 2017-09-15 DIAGNOSIS — G35 Multiple sclerosis: Secondary | ICD-10-CM | POA: Diagnosis not present

## 2017-09-15 DIAGNOSIS — Z48815 Encounter for surgical aftercare following surgery on the digestive system: Secondary | ICD-10-CM | POA: Diagnosis not present

## 2017-09-17 DIAGNOSIS — Z48815 Encounter for surgical aftercare following surgery on the digestive system: Secondary | ICD-10-CM | POA: Diagnosis not present

## 2017-09-17 DIAGNOSIS — G35 Multiple sclerosis: Secondary | ICD-10-CM | POA: Diagnosis not present

## 2017-09-17 DIAGNOSIS — M1990 Unspecified osteoarthritis, unspecified site: Secondary | ICD-10-CM | POA: Diagnosis not present

## 2017-09-17 DIAGNOSIS — M797 Fibromyalgia: Secondary | ICD-10-CM | POA: Diagnosis not present

## 2017-09-20 DIAGNOSIS — M1990 Unspecified osteoarthritis, unspecified site: Secondary | ICD-10-CM | POA: Diagnosis not present

## 2017-09-20 DIAGNOSIS — Z48815 Encounter for surgical aftercare following surgery on the digestive system: Secondary | ICD-10-CM | POA: Diagnosis not present

## 2017-09-20 DIAGNOSIS — M797 Fibromyalgia: Secondary | ICD-10-CM | POA: Diagnosis not present

## 2017-09-20 DIAGNOSIS — G35 Multiple sclerosis: Secondary | ICD-10-CM | POA: Diagnosis not present

## 2017-09-22 DIAGNOSIS — M797 Fibromyalgia: Secondary | ICD-10-CM | POA: Diagnosis not present

## 2017-09-22 DIAGNOSIS — G35 Multiple sclerosis: Secondary | ICD-10-CM | POA: Diagnosis not present

## 2017-09-22 DIAGNOSIS — M1990 Unspecified osteoarthritis, unspecified site: Secondary | ICD-10-CM | POA: Diagnosis not present

## 2017-09-22 DIAGNOSIS — Z48815 Encounter for surgical aftercare following surgery on the digestive system: Secondary | ICD-10-CM | POA: Diagnosis not present

## 2017-09-24 DIAGNOSIS — Z48815 Encounter for surgical aftercare following surgery on the digestive system: Secondary | ICD-10-CM | POA: Diagnosis not present

## 2017-09-24 DIAGNOSIS — M797 Fibromyalgia: Secondary | ICD-10-CM | POA: Diagnosis not present

## 2017-09-24 DIAGNOSIS — M1990 Unspecified osteoarthritis, unspecified site: Secondary | ICD-10-CM | POA: Diagnosis not present

## 2017-09-24 DIAGNOSIS — G35 Multiple sclerosis: Secondary | ICD-10-CM | POA: Diagnosis not present

## 2017-10-15 DIAGNOSIS — I10 Essential (primary) hypertension: Secondary | ICD-10-CM | POA: Diagnosis not present

## 2017-10-15 DIAGNOSIS — G4709 Other insomnia: Secondary | ICD-10-CM | POA: Diagnosis not present

## 2017-10-15 DIAGNOSIS — M6530 Trigger finger, unspecified finger: Secondary | ICD-10-CM | POA: Diagnosis not present

## 2017-10-15 DIAGNOSIS — Z86010 Personal history of colonic polyps: Secondary | ICD-10-CM | POA: Diagnosis not present

## 2017-10-15 DIAGNOSIS — M797 Fibromyalgia: Secondary | ICD-10-CM | POA: Diagnosis not present

## 2017-10-15 DIAGNOSIS — F321 Major depressive disorder, single episode, moderate: Secondary | ICD-10-CM | POA: Diagnosis not present

## 2017-10-25 DIAGNOSIS — K219 Gastro-esophageal reflux disease without esophagitis: Secondary | ICD-10-CM | POA: Diagnosis not present

## 2017-10-25 DIAGNOSIS — Z800 Family history of malignant neoplasm of digestive organs: Secondary | ICD-10-CM | POA: Diagnosis not present

## 2017-10-25 DIAGNOSIS — Z1211 Encounter for screening for malignant neoplasm of colon: Secondary | ICD-10-CM | POA: Diagnosis not present

## 2017-11-11 DIAGNOSIS — K621 Rectal polyp: Secondary | ICD-10-CM | POA: Diagnosis not present

## 2017-11-11 DIAGNOSIS — K635 Polyp of colon: Secondary | ICD-10-CM | POA: Diagnosis not present

## 2017-11-11 DIAGNOSIS — D123 Benign neoplasm of transverse colon: Secondary | ICD-10-CM | POA: Diagnosis not present

## 2017-11-11 DIAGNOSIS — D125 Benign neoplasm of sigmoid colon: Secondary | ICD-10-CM | POA: Diagnosis not present

## 2017-11-11 DIAGNOSIS — K5730 Diverticulosis of large intestine without perforation or abscess without bleeding: Secondary | ICD-10-CM | POA: Diagnosis not present

## 2017-11-11 DIAGNOSIS — Z86010 Personal history of colonic polyps: Secondary | ICD-10-CM | POA: Diagnosis not present

## 2017-11-11 DIAGNOSIS — D128 Benign neoplasm of rectum: Secondary | ICD-10-CM | POA: Diagnosis not present

## 2017-11-15 DIAGNOSIS — R002 Palpitations: Secondary | ICD-10-CM | POA: Diagnosis not present

## 2017-11-15 DIAGNOSIS — M545 Low back pain: Secondary | ICD-10-CM | POA: Diagnosis not present

## 2017-11-15 DIAGNOSIS — F321 Major depressive disorder, single episode, moderate: Secondary | ICD-10-CM | POA: Diagnosis not present

## 2017-11-15 DIAGNOSIS — K219 Gastro-esophageal reflux disease without esophagitis: Secondary | ICD-10-CM | POA: Diagnosis not present

## 2017-11-15 DIAGNOSIS — I10 Essential (primary) hypertension: Secondary | ICD-10-CM | POA: Diagnosis not present

## 2017-11-15 DIAGNOSIS — M6530 Trigger finger, unspecified finger: Secondary | ICD-10-CM | POA: Diagnosis not present

## 2017-11-15 DIAGNOSIS — H9313 Tinnitus, bilateral: Secondary | ICD-10-CM | POA: Diagnosis not present

## 2017-11-15 DIAGNOSIS — R42 Dizziness and giddiness: Secondary | ICD-10-CM | POA: Diagnosis not present

## 2017-12-06 DIAGNOSIS — R002 Palpitations: Secondary | ICD-10-CM | POA: Diagnosis not present

## 2017-12-06 DIAGNOSIS — I10 Essential (primary) hypertension: Secondary | ICD-10-CM | POA: Diagnosis not present

## 2017-12-17 DIAGNOSIS — K219 Gastro-esophageal reflux disease without esophagitis: Secondary | ICD-10-CM | POA: Diagnosis not present

## 2017-12-17 DIAGNOSIS — Z1231 Encounter for screening mammogram for malignant neoplasm of breast: Secondary | ICD-10-CM | POA: Diagnosis not present

## 2017-12-17 DIAGNOSIS — M545 Low back pain: Secondary | ICD-10-CM | POA: Diagnosis not present

## 2017-12-17 DIAGNOSIS — F321 Major depressive disorder, single episode, moderate: Secondary | ICD-10-CM | POA: Diagnosis not present

## 2017-12-17 DIAGNOSIS — I10 Essential (primary) hypertension: Secondary | ICD-10-CM | POA: Diagnosis not present

## 2017-12-17 DIAGNOSIS — M65332 Trigger finger, left middle finger: Secondary | ICD-10-CM | POA: Diagnosis not present

## 2017-12-17 DIAGNOSIS — R002 Palpitations: Secondary | ICD-10-CM | POA: Diagnosis not present

## 2017-12-17 DIAGNOSIS — M6530 Trigger finger, unspecified finger: Secondary | ICD-10-CM | POA: Diagnosis not present

## 2018-02-11 ENCOUNTER — Emergency Department (HOSPITAL_COMMUNITY)
Admission: EM | Admit: 2018-02-11 | Discharge: 2018-02-11 | Disposition: A | Discharge status: Home / Self Care | Payer: Medicare Other | Attending: Physician Assistant | Admitting: Physician Assistant

## 2018-02-11 ENCOUNTER — Emergency Department (HOSPITAL_COMMUNITY): Payer: Medicare Other

## 2018-02-11 ENCOUNTER — Other Ambulatory Visit: Payer: Self-pay

## 2018-02-11 ENCOUNTER — Encounter (HOSPITAL_COMMUNITY): Payer: Self-pay | Admitting: Radiology

## 2018-02-11 DIAGNOSIS — I6523 Occlusion and stenosis of bilateral carotid arteries: Secondary | ICD-10-CM | POA: Diagnosis not present

## 2018-02-11 DIAGNOSIS — F430 Acute stress reaction: Secondary | ICD-10-CM | POA: Diagnosis not present

## 2018-02-11 DIAGNOSIS — R200 Anesthesia of skin: Secondary | ICD-10-CM | POA: Diagnosis not present

## 2018-02-11 DIAGNOSIS — R202 Paresthesia of skin: Secondary | ICD-10-CM | POA: Insufficient documentation

## 2018-02-11 DIAGNOSIS — Z91010 Allergy to peanuts: Secondary | ICD-10-CM | POA: Diagnosis not present

## 2018-02-11 DIAGNOSIS — R112 Nausea with vomiting, unspecified: Secondary | ICD-10-CM | POA: Diagnosis not present

## 2018-02-11 DIAGNOSIS — Z79899 Other long term (current) drug therapy: Secondary | ICD-10-CM | POA: Diagnosis not present

## 2018-02-11 DIAGNOSIS — I10 Essential (primary) hypertension: Secondary | ICD-10-CM | POA: Insufficient documentation

## 2018-02-11 DIAGNOSIS — R42 Dizziness and giddiness: Secondary | ICD-10-CM | POA: Diagnosis not present

## 2018-02-11 DIAGNOSIS — R29810 Facial weakness: Secondary | ICD-10-CM | POA: Diagnosis not present

## 2018-02-11 DIAGNOSIS — F17200 Nicotine dependence, unspecified, uncomplicated: Secondary | ICD-10-CM | POA: Diagnosis not present

## 2018-02-11 DIAGNOSIS — R531 Weakness: Secondary | ICD-10-CM | POA: Diagnosis not present

## 2018-02-11 DIAGNOSIS — G35 Multiple sclerosis: Secondary | ICD-10-CM | POA: Insufficient documentation

## 2018-02-11 DIAGNOSIS — R29818 Other symptoms and signs involving the nervous system: Secondary | ICD-10-CM | POA: Diagnosis not present

## 2018-02-11 LAB — PROTIME-INR
INR: 0.97
Prothrombin Time: 12.8 seconds (ref 11.4–15.2)

## 2018-02-11 LAB — COMPREHENSIVE METABOLIC PANEL
ALT: 11 U/L — ABNORMAL LOW (ref 14–54)
AST: 14 U/L — ABNORMAL LOW (ref 15–41)
Albumin: 3.5 g/dL (ref 3.5–5.0)
Alkaline Phosphatase: 95 U/L (ref 38–126)
Anion gap: 10 (ref 5–15)
BUN: 11 mg/dL (ref 6–20)
CO2: 26 mmol/L (ref 22–32)
Calcium: 8.9 mg/dL (ref 8.9–10.3)
Chloride: 105 mmol/L (ref 101–111)
Creatinine, Ser: 0.63 mg/dL (ref 0.44–1.00)
GFR calc Af Amer: >60 mL/min (ref >60)
GFR calc non Af Amer: >60 mL/min (ref >60)
Glucose, Bld: 92 mg/dL (ref 65–99)
Potassium: 3.9 mmol/L (ref 3.5–5.1)
Sodium: 141 mmol/L (ref 135–145)
Total Bilirubin: 0.6 mg/dL (ref 0.3–1.2)
Total Protein: 7.4 g/dL (ref 6.5–8.1)

## 2018-02-11 LAB — DIFFERENTIAL
Basophils Absolute: 0 10*3/uL (ref 0.0–0.1)
Basophils Relative: 0 %
Eosinophils Absolute: 0.1 10*3/uL (ref 0.0–0.7)
Eosinophils Relative: 1 %
Lymphocytes Relative: 36 %
Lymphs Abs: 3.4 10*3/uL (ref 0.7–4.0)
Monocytes Absolute: 0.4 10*3/uL (ref 0.1–1.0)
Monocytes Relative: 5 %
Neutro Abs: 5.5 10*3/uL (ref 1.7–7.7)
Neutrophils Relative %: 58 %

## 2018-02-11 LAB — I-STAT TROPONIN, ED: Troponin i, poc: 0 ng/mL (ref 0.00–0.08)

## 2018-02-11 LAB — CBC
HCT: 44.5 % (ref 36.0–46.0)
Hemoglobin: 14.5 g/dL (ref 12.0–15.0)
MCH: 29.6 pg (ref 26.0–34.0)
MCHC: 32.6 g/dL (ref 30.0–36.0)
MCV: 90.8 fL (ref 78.0–100.0)
Platelets: 217 10*3/uL (ref 150–400)
RBC: 4.9 MIL/uL (ref 3.87–5.11)
RDW: 14.6 % (ref 11.5–15.5)
WBC: 9.4 10*3/uL (ref 4.0–10.5)

## 2018-02-11 LAB — I-STAT CHEM 8, ED
BUN: 14 mg/dL (ref 6–20)
Calcium, Ion: 1.08 mmol/L — ABNORMAL LOW (ref 1.15–1.40)
Chloride: 103 mmol/L (ref 101–111)
Creatinine, Ser: 0.7 mg/dL (ref 0.44–1.00)
Glucose, Bld: 86 mg/dL (ref 65–99)
HCT: 46 % (ref 36.0–46.0)
Hemoglobin: 15.6 g/dL — ABNORMAL HIGH (ref 12.0–15.0)
Potassium: 3.9 mmol/L (ref 3.5–5.1)
Sodium: 142 mmol/L (ref 135–145)
TCO2: 29 mmol/L (ref 22–32)

## 2018-02-11 LAB — I-STAT BETA HCG BLOOD, ED (MC, WL, AP ONLY): I-stat hCG, quantitative: 6 m[IU]/mL — ABNORMAL HIGH (ref <5)

## 2018-02-11 LAB — APTT: aPTT: 29 seconds (ref 24–36)

## 2018-02-11 MED ORDER — IOPAMIDOL (ISOVUE-370) INJECTION 76%
INTRAVENOUS | Status: AC
  Administered 2018-02-11: 50 mL
  Filled 2018-02-11: qty 50

## 2018-02-11 MED ORDER — MECLIZINE HCL 25 MG PO TABS: 25.0000 mg | ORAL_TABLET | Freq: Three times a day (TID) | ORAL | 0 refills | Status: DC | PRN

## 2018-02-11 MED ORDER — LORAZEPAM 2 MG/ML IJ SOLN
0.5000 mg | Freq: Once | INTRAMUSCULAR | Status: AC
  Administered 2018-02-11: 0.5 mg via INTRAVENOUS
  Filled 2018-02-11: qty 1

## 2018-02-11 NOTE — Discharge Instructions (Signed)
Get help right away if: °You have pain in your chest, neck, arm, or jaw. °You feel extremely weak or you faint. °You have persistent vomiting. °You see blood in your vomit. °Your vomit looks like black coffee grounds. °You have bloody or black stools or stools that look like tar. °You have a severe headache, a stiff neck, or both. °You have a rash. °You have severe pain, cramping, or bloating in your abdomen. °You have trouble breathing or you are breathing very quickly. °Your heart is beating very quickly. °Your skin feels cold and clammy. °You feel confused. °You have pain when you urinate. °You have signs of dehydration, such as: °Dark urine, very little urine, or no urine. °Cracked lips. °Dry mouth. °Sunken eyes. °Sleepiness. °Weakness. °

## 2018-02-11 NOTE — ED Provider Notes (Signed)
[facility] EMERGENCY DEPARTMENT Provider Note   CSN: [id number] Arrival date & time: 02/11/18  1210   An emergency department physician performed an initial assessment on this suspected stroke patient at 1230.  History   Chief Complaint Chief Complaint  Patient presents with  . Dizziness    HPI Megan Dixon is a 52 y.o. female WHO presents to the ED with cc of nausea vomiting dizziness stuttering speech and weakness.  Had onset of lightheadedness about 11:45 PM last night.  She denies vertigo.  She denies any ataxia or disequilibrium.  Patient then had onset of nausea and vomiting.  She states that she felt like her tongue was thick.  She did have one episode of nonbloody nonbilious vomitus.  She denies abdominal pain or diarrhea.  The patient states that she felt like her face went numb but denies that it was one-sided.  She denies any weakness of her extremities to the knee.Megan Dixon Continues to feel somewhat nauseous however denies any active vomiting.  She has a "slight pain in her head".  HPI  Past Medical History:  Diagnosis Date  . Arthritis   . Fibromyalgia   . Hypertension   . MS (multiple sclerosis) ([place])    On your screen Patient Active Problem List   Diagnosis Date Noted  . Incarcerated incisional hernia 08/10/2017  . S/P exploratory laparotomy 08/09/2017    Past Surgical History:  Procedure Laterality Date  . ABDOMINAL SURGERY    . HERNIA REPAIR    . JOINT REPLACEMENT    . VENTRAL HERNIA REPAIR N/A 08/09/2017   Procedure: Incarcerated ventral hernia repair with mesh ;  Surgeon: Rolm Bookbinder, MD;  Location: [facility];  Service: General;  Laterality: N/A;    OB History    No data available       Home Medications    Prior to Admission medications   Medication Sig Start Date End Date Taking? Authorizing Provider  acetaminophen (TYLENOL) 325 MG tablet Take 650 mg by mouth every 6 (six) hours as needed for moderate pain or  headache.   Yes [provider]  cyclobenzaprine (FLEXERIL) 10 MG tablet Take 10 mg by mouth 3 (three) times daily as needed for muscle spasms.   Yes [provider]  FLUoxetine (PROZAC) 20 MG capsule Take 60 mg by mouth daily.   Yes [provider]  gabapentin (NEURONTIN) 600 MG tablet Take 600 mg by mouth 3 (three) times daily.    Yes [provider]  HYDROcodone-acetaminophen (NORCO) 5-325 MG tablet Take 1 tablet by mouth every 6 (six) hours as needed for moderate pain. 08/13/17  Yes Kinsinger, Arta Bruce, MD  losartan-hydrochlorothiazide (HYZAAR) 100-25 MG tablet Take 1 tablet by mouth daily.   Yes [provider]  ondansetron (ZOFRAN ODT) 4 MG disintegrating tablet Take 1 tablet (4 mg total) by mouth every 8 (eight) hours as needed for nausea or vomiting. 08/13/17  Yes Kinsinger, Arta Bruce, MD  pantoprazole (PROTONIX) 40 MG tablet Take 40 mg by mouth daily.   Yes [provider]  polyethylene glycol (MIRALAX) packet Mix 8 cap fulls in 32 ounces of juice/water/gatorade and drink over the course of a day. Once your stool is loose or liquid, stop taking. Patient taking differently: Take 17 g by mouth daily as needed for mild constipation.  02/04/17  Yes Duffy Bruce, MD  cloNIDine (CATAPRES) 0.1 MG tablet Take 0.1 mg by mouth at bedtime. 12/17/17   [provider]  naproxen sodium (  ANAPROX) 220 MG tablet Take 220-440 mg by mouth 2 (two) times daily as needed (for pain).    [provider]    Family History No family history on file.  Social History Social History   Tobacco Use  . Smoking status: Current Every Day Smoker  . Smokeless tobacco: Never Used  Substance Use Topics  . Alcohol use: Yes  . Drug use: Not on file     Allergies   Morphine and related; Other; Peanut-containing drug products; and Toradol [ketorolac tromethamine]   Review of Systems Review of Systems  Ten systems reviewed and are negative  for acute change, except as noted in the HPI.   Physical Exam Updated Vital Signs BP (!) 147/91   Pulse 70   Temp 97.6 F (36.4 C)   Resp 19   Wt 111 kg (244 lb 11.4 oz)   SpO2 99%   BMI 46.24 kg/m   Physical Exam  Constitutional: She is oriented to person, place, and time.  Neurological: She is alert and oriented to person, place, and time. She displays normal reflexes. No cranial nerve deficit or sensory deficit. She exhibits normal muscle tone. Coordination normal.  See full neuro exam by Dr. Lorraine Lax  Nursing note and vitals reviewed.    ED Treatments / Results  Labs (all labs ordered are listed, but only abnormal results are displayed) Labs Reviewed  COMPREHENSIVE METABOLIC PANEL - Abnormal; Notable for the following components:      Result Value   AST 14 (*)    ALT 11 (*)    All other components within normal limits  I-STAT CHEM 8, ED - Abnormal; Notable for the following components:   Calcium, Ion 1.08 (*)    Hemoglobin 15.6 (*)    All other components within normal limits  I-STAT BETA HCG BLOOD, ED ([facility], [facility], [facility] ONLY) - Abnormal; Notable for the following components:   I-stat hCG, quantitative 6.0 (*)    All other components within normal limits  PROTIME-INR  APTT  CBC  DIFFERENTIAL  I-STAT TROPONIN, ED  CBG MONITORING, ED    EKG  EKG Interpretation  Date/Time:  Friday February 11 2018 13:15:41 EST Ventricular Rate:  75 PR Interval:    QRS Duration: 98 QT Interval:  460 QTC Calculation: 514 R Axis:   37 Text Interpretation:  Sinus rhythm Prolonged QT interval Normal sinus rhythm Confirmed by Purdy [phone]) on 02/11/2018 3:29:46 PM       Radiology Ct Angio Head W Or Wo Contrast  Result Date: 02/11/2018 CLINICAL DATA:  Focal neuro deficit. Stroke suspected. Left-sided weakness. EXAM: CT ANGIOGRAPHY HEAD AND NECK TECHNIQUE: Multidetector CT imaging of the head and neck was performed using the standard protocol during bolus administration of  intravenous contrast. Multiplanar CT image reconstructions and MIPs were obtained to evaluate the vascular anatomy. Carotid stenosis measurements (when applicable) are obtained utilizing NASCET criteria, using the distal internal carotid diameter as the denominator. CONTRAST:  71mL ISOVUE-370 IOPAMIDOL (ISOVUE-370) INJECTION 76% COMPARISON:  Noncontrast head CT from earlier today FINDINGS: CTA NECK FINDINGS Aortic arch: Mild atherosclerotic plaque. Two vessel branching pattern. No acute finding or dilatation where seen. Right carotid system: No noted atheromatous changes. No stenosis, ulceration, or beading. Left carotid system: No noted atheromatous change. No stenosis, ulceration, or beading. Vertebral arteries: No proximal subclavian stenosis. Codominant vertebral arteries that are patent to the dura. There is limited visualization of the proximal V1 segments, especially on the right, due to artifact including intravenous contrast.  Skeleton: No acute finding Other neck: Negative Upper chest: Mosaic attenuation in the left upper lobe attributed to air trapping. Review of the MIP images confirms the above findings CTA HEAD FINDINGS Anterior circulation: Atherosclerotic plaque on the carotid siphons. No branch occlusion or flow limiting stenosis. Hypoplastic right A1 segment. Negative for aneurysm or beading. Posterior circulation: Small vertebral and basilar arteries. Circle-of-Willis is intact. No branch occlusion or visible proximal flow limiting stenosis. Venous sinuses: Patent as permitted by time Anatomic variants: As above Delayed phase: Not obtained in the emergent setting These results were communicated to Dr. Lorraine Lax at 1:14 pmon 3/1/2019by text page via the [facility] messaging system. Review of the MIP images confirms the above findings IMPRESSION: 1. No emergent large vessel occlusion. 2. Mild atherosclerosis without flow limiting stenosis or visible embolic source. Electronically Signed   By: Monte Fantasia  M.D.   On: 02/11/2018 13:15   Ct Angio Neck W Or Wo Contrast  Result Date: 02/11/2018 CLINICAL DATA:  Focal neuro deficit. Stroke suspected. Left-sided weakness. EXAM: CT ANGIOGRAPHY HEAD AND NECK TECHNIQUE: Multidetector CT imaging of the head and neck was performed using the standard protocol during bolus administration of intravenous contrast. Multiplanar CT image reconstructions and MIPs were obtained to evaluate the vascular anatomy. Carotid stenosis measurements (when applicable) are obtained utilizing NASCET criteria, using the distal internal carotid diameter as the denominator. CONTRAST:  65mL ISOVUE-370 IOPAMIDOL (ISOVUE-370) INJECTION 76% COMPARISON:  Noncontrast head CT from earlier today FINDINGS: CTA NECK FINDINGS Aortic arch: Mild atherosclerotic plaque. Two vessel branching pattern. No acute finding or dilatation where seen. Right carotid system: No noted atheromatous changes. No stenosis, ulceration, or beading. Left carotid system: No noted atheromatous change. No stenosis, ulceration, or beading. Vertebral arteries: No proximal subclavian stenosis. Codominant vertebral arteries that are patent to the dura. There is limited visualization of the proximal V1 segments, especially on the right, due to artifact including intravenous contrast. Skeleton: No acute finding Other neck: Negative Upper chest: Mosaic attenuation in the left upper lobe attributed to air trapping. Review of the MIP images confirms the above findings CTA HEAD FINDINGS Anterior circulation: Atherosclerotic plaque on the carotid siphons. No branch occlusion or flow limiting stenosis. Hypoplastic right A1 segment. Negative for aneurysm or beading. Posterior circulation: Small vertebral and basilar arteries. Circle-of-Willis is intact. No branch occlusion or visible proximal flow limiting stenosis. Venous sinuses: Patent as permitted by time Anatomic variants: As above Delayed phase: Not obtained in the emergent setting These  results were communicated to Dr. Lorraine Lax at 1:14 pmon 3/1/2019by text page via the [facility] messaging system. Review of the MIP images confirms the above findings IMPRESSION: 1. No emergent large vessel occlusion. 2. Mild atherosclerosis without flow limiting stenosis or visible embolic source. Electronically Signed   By: Monte Fantasia M.D.   On: 02/11/2018 13:15   Ct Head Code Stroke Wo Contrast  Result Date: 02/11/2018 CLINICAL DATA:  Code stroke. 52 year old female with left side weakness. Last seen normal 1100 hours. EXAM: CT HEAD WITHOUT CONTRAST TECHNIQUE: Contiguous axial images were obtained from the base of the skull through the vertex without intravenous contrast. COMPARISON:  None. FINDINGS: Brain: Normal cerebral volume. No midline shift, ventriculomegaly, mass effect, evidence of mass lesion, intracranial hemorrhage or evidence of cortically based acute infarction. Gray-white matter differentiation is within normal limits throughout the brain. Vascular: Calcified atherosclerosis at the skull base. No suspicious intracranial vascular hyperdensity. Skull: Within normal limits. Sinuses/Orbits: Minimal frontal sinus mucosal thickening on the left. The right frontal  sinus is hypoplastic. Other: Orbit and scalp soft tissues appear negative. ASPECTS [facility] Stroke Program Early CT Score) - Ganglionic level infarction (caudate, lentiform nuclei, internal capsule, insula, M1-M3 cortex): 7 - Supraganglionic infarction (M4-M6 cortex): 3 Total score (0-10 with 10 being normal): 10 IMPRESSION: 1. Normal noncontrast CT appearance of the brain. 2. ASPECTS is 10. 3. These results were communicated to Dr. Lorraine Lax at 12:52 pmon 3/1/2019by text page via the [facility] messaging system. Electronically Signed   By: Genevie Ann M.D.   On: 02/11/2018 12:52    Procedures Procedures (including critical care time)  Medications Ordered in ED Medications  LORazepam (ATIVAN) injection 0.5 mg (not administered)  iopamidol  (ISOVUE-370) 76 % injection (50 mLs  Contrast Given 02/11/18 1248)     Initial Impression / Assessment and Plan / ED Course  I have reviewed the triage vital signs and the nursing notes.  Pertinent labs & imaging results that were available during my care of the patient were reviewed by me and considered in my medical decision making (see chart for details).    EKG shows prolonged QT. Patient's labs, imaging, CT scan and MRI are all negative without significant abnormalities.  I suspect the patient had hyperventilation syndrome associated with stress reaction to her vomiting this morning.  She has not had any nausea or vomiting here in the emergency department.  She has been able to ambulate here in the emergency department without ataxia.  Patient appears appropriate for discharge at this time.  She will be discharged with meclizine for control of nausea and mild lightheadedness and to avoid prolonging agents..  She follow-up with PCP.  Final Clinical Impressions(s) / ED Diagnoses   Final diagnoses:  Non-intractable vomiting with nausea, unspecified vomiting type  Stress reaction    ED Discharge Orders    None       Margarita Mail, PA-C 02/11/18 1723    Macarthur Critchley, MD 02/11/18 2008

## 2018-02-11 NOTE — ED Triage Notes (Addendum)
Pt presents to the ed with complaints of dizziness that started last night. At 1145 the patient started having numbness in the left side of her face, left hand and feels like it is hard for her to get her words out. Pt has weakness in her left hand. EDP notifed, code stroke called. Pt also endorses palpitations and nausea, ekg completed in triage.

## 2018-02-11 NOTE — ED Notes (Signed)
Pt to MRI at this time.  Daughter remains in pt's room

## 2018-02-11 NOTE — Consult Note (Signed)
Requesting Physician: Dr. Roderic Palau    Chief Complaint: Stuttering speech, dizziness and left side weakness  History obtained from: Patient and Chart    HPI:                                                                                                                                       Megan Dixon is an 52 y.o. female with PMH of HTN, Fibromyalgia, arthritis, and ? MS who presents with dizziness since yesterday.   She last felt normal at 8.30 when she started felling nauseous. She went to bed and woke up at 11.30 pm with vertigo. Persisted this morning and around 11.45 started having numbness of face, weakness and of left hand ans stuttering speech. She was stroke alerted at triage. BP was 147/91.   CT head showed no bleed/acute findings. Had mild drift ( not pronator) in arm, mild drift in leg and stuttering words.  No tPA as she was outside window. CTA head and neck was negative for LVO/stenosis.    Date last known well: 2.28.19 Time last known well: 8.30 pm tPA Given: no,outside window  NIHSS:  3 Baseline MRS 0   Past Medical History:  Diagnosis Date  . Arthritis   . Fibromyalgia   . Hypertension   . MS (multiple sclerosis) ([place])     Past Surgical History:  Procedure Laterality Date  . ABDOMINAL SURGERY    . HERNIA REPAIR    . JOINT REPLACEMENT    . VENTRAL HERNIA REPAIR N/A 08/09/2017   Procedure: Incarcerated ventral hernia repair with mesh ;  Surgeon: Rolm Bookbinder, MD;  Location: [facility];  Service: General;  Laterality: N/A;    No family history on file. Social History:  reports that she has been smoking.  she has never used smokeless tobacco. She reports that she drinks alcohol. Her drug history is not on file.  Allergies:  Allergies  Allergen Reactions  . Morphine And Related Other (See Comments)    Headache  . Other Other (See Comments)    CANNOT EAT ANYTHING WITH SEEDS (DIVERTICULITIS)  . Peanut-Containing Drug Products Other (See Comments)    Has  Diverticulitis   . Toradol [Ketorolac Tromethamine] Anxiety    Medications:  I reviewed home medications   ROS:                                                                                                                                     14 systems reviewed and negative except above    Examination:                                                                                                      General: Appears well-developed and well-nourished.  Psych: Affect appropriate to situation Eyes: No scleral injection HENT: No OP obstrucion Head: Normocephalic.  Cardiovascular: Normal rate and regular rhythm.  Respiratory: Effort normal and breath sounds normal to anterior ascultation GI: Soft.  No distension. There is no tenderness.  Skin: WDI   Neurological Examination Mental Status: Alert, oriented, thought content appropriate.  Speech fluent without evidence of aphasia. Able to follow 3 step commands without difficulty. Cranial Nerves: II: Visual fields grossly normal,  III,IV, VI: ptosis not present, extra-ocular motions intact bilaterally, pupils equal, round, reactive to light and accommodation V,VII: no facial droop, subjectively reducedlight touch sensation over left face VIII: hearing normal bilaterally IX,X: uvula rises symmetrically XI: bilateral shoulder shrug XII: midline tongue extension Motor: Right : Upper extremity   5/5    Left:     Upper extremity   4+/5  Lower extremity   5/5     Lower extremity   4+/5 Tone and bulk:normal tone throughout; no atrophy noted Sensory: subjective reduced sensation over left arm and leg, face Deep Tendon Reflexes: 2+ and symmetric throughout Plantars: Right: downgoing   Left: downgoing Cerebellar: normal finger-to-nose, normal rapid alternating movements and normal heel-to-shin test Gait: normal gait  and station     Lab Results: Basic Metabolic Panel: Recent Labs  Lab 02/11/18 1244  NA 142  K 3.9  CL 103  GLUCOSE 86  BUN 14  CREATININE 0.70    CBC: Recent Labs  Lab 02/11/18 1230 02/11/18 1244  WBC 9.4  --   NEUTROABS 5.5  --   HGB 14.5 15.6*  HCT 44.5 46.0  MCV 90.8  --   PLT 217  --     Coagulation Studies: No results for input(s): LABPROT, INR in the last 72 hours.  Imaging: No results found.   ASSESSMENT AND PLAN   52 y.o. female with PMH of HTN, Fibromyalgia, arthritis, and ? MS who presents with dizziness since yesterday. Subsequently developed left side weakness, stuttering speech. NO tPA as she was  outside window. Embellishment in exam noted.    Impression Vertigo Left side numbness, mild weakness and stuttering speech  Plan Stat MRI brain showed no acute stroke. No further neurological workup needed emergently.    Sushanth Aroor [facility] Pager Number [phone]

## 2018-02-14 DIAGNOSIS — R42 Dizziness and giddiness: Secondary | ICD-10-CM | POA: Diagnosis not present

## 2018-02-14 DIAGNOSIS — I10 Essential (primary) hypertension: Secondary | ICD-10-CM | POA: Diagnosis not present

## 2018-05-04 DIAGNOSIS — M545 Low back pain: Secondary | ICD-10-CM | POA: Diagnosis not present

## 2018-05-04 DIAGNOSIS — M542 Cervicalgia: Secondary | ICD-10-CM | POA: Diagnosis not present

## 2018-05-04 DIAGNOSIS — F321 Major depressive disorder, single episode, moderate: Secondary | ICD-10-CM | POA: Diagnosis not present

## 2018-05-04 DIAGNOSIS — I10 Essential (primary) hypertension: Secondary | ICD-10-CM | POA: Diagnosis not present

## 2018-05-04 DIAGNOSIS — K219 Gastro-esophageal reflux disease without esophagitis: Secondary | ICD-10-CM | POA: Diagnosis not present

## 2018-07-14 DIAGNOSIS — J189 Pneumonia, unspecified organism: Secondary | ICD-10-CM

## 2018-07-14 HISTORY — DX: Pneumonia, unspecified organism: J18.9

## 2018-08-01 ENCOUNTER — Emergency Department (HOSPITAL_COMMUNITY): Payer: Medicare Other

## 2018-08-01 ENCOUNTER — Inpatient Hospital Stay (HOSPITAL_COMMUNITY)
Admission: EM | Admit: 2018-08-01 | Discharge: 2018-08-04 | DRG: 871 | Disposition: A | Discharge status: Home / Self Care | Payer: Medicare Other | Attending: Internal Medicine | Admitting: Internal Medicine

## 2018-08-01 ENCOUNTER — Encounter (HOSPITAL_COMMUNITY): Payer: Self-pay | Admitting: Emergency Medicine

## 2018-08-01 ENCOUNTER — Other Ambulatory Visit: Payer: Self-pay

## 2018-08-01 DIAGNOSIS — G35 Multiple sclerosis: Secondary | ICD-10-CM | POA: Diagnosis not present

## 2018-08-01 DIAGNOSIS — D751 Secondary polycythemia: Secondary | ICD-10-CM | POA: Diagnosis not present

## 2018-08-01 DIAGNOSIS — Z6841 Body Mass Index (BMI) 40.0 and over, adult: Secondary | ICD-10-CM

## 2018-08-01 DIAGNOSIS — Z79899 Other long term (current) drug therapy: Secondary | ICD-10-CM

## 2018-08-01 DIAGNOSIS — G8929 Other chronic pain: Secondary | ICD-10-CM | POA: Diagnosis present

## 2018-08-01 DIAGNOSIS — R0602 Shortness of breath: Secondary | ICD-10-CM | POA: Diagnosis not present

## 2018-08-01 DIAGNOSIS — R509 Fever, unspecified: Secondary | ICD-10-CM | POA: Diagnosis not present

## 2018-08-01 DIAGNOSIS — R079 Chest pain, unspecified: Secondary | ICD-10-CM | POA: Diagnosis not present

## 2018-08-01 DIAGNOSIS — Z886 Allergy status to analgesic agent status: Secondary | ICD-10-CM

## 2018-08-01 DIAGNOSIS — Z91010 Allergy to peanuts: Secondary | ICD-10-CM

## 2018-08-01 DIAGNOSIS — J189 Pneumonia, unspecified organism: Secondary | ICD-10-CM

## 2018-08-01 DIAGNOSIS — M1990 Unspecified osteoarthritis, unspecified site: Secondary | ICD-10-CM | POA: Diagnosis present

## 2018-08-01 DIAGNOSIS — R042 Hemoptysis: Secondary | ICD-10-CM | POA: Diagnosis present

## 2018-08-01 DIAGNOSIS — J181 Lobar pneumonia, unspecified organism: Secondary | ICD-10-CM | POA: Diagnosis present

## 2018-08-01 DIAGNOSIS — R05 Cough: Secondary | ICD-10-CM | POA: Diagnosis not present

## 2018-08-01 DIAGNOSIS — R0902 Hypoxemia: Secondary | ICD-10-CM | POA: Diagnosis not present

## 2018-08-01 DIAGNOSIS — A419 Sepsis, unspecified organism: Principal | ICD-10-CM | POA: Diagnosis present

## 2018-08-01 DIAGNOSIS — F329 Major depressive disorder, single episode, unspecified: Secondary | ICD-10-CM | POA: Diagnosis present

## 2018-08-01 DIAGNOSIS — M797 Fibromyalgia: Secondary | ICD-10-CM | POA: Diagnosis not present

## 2018-08-01 DIAGNOSIS — F419 Anxiety disorder, unspecified: Secondary | ICD-10-CM | POA: Diagnosis present

## 2018-08-01 DIAGNOSIS — F17200 Nicotine dependence, unspecified, uncomplicated: Secondary | ICD-10-CM | POA: Diagnosis present

## 2018-08-01 DIAGNOSIS — R0789 Other chest pain: Secondary | ICD-10-CM | POA: Diagnosis not present

## 2018-08-01 DIAGNOSIS — R000 Tachycardia, unspecified: Secondary | ICD-10-CM | POA: Diagnosis not present

## 2018-08-01 DIAGNOSIS — E876 Hypokalemia: Secondary | ICD-10-CM | POA: Diagnosis present

## 2018-08-01 DIAGNOSIS — I10 Essential (primary) hypertension: Secondary | ICD-10-CM | POA: Diagnosis present

## 2018-08-01 DIAGNOSIS — Z885 Allergy status to narcotic agent status: Secondary | ICD-10-CM

## 2018-08-01 HISTORY — DX: Pneumonia, unspecified organism: J18.9

## 2018-08-01 LAB — COMPREHENSIVE METABOLIC PANEL
ALT: 13 U/L (ref 0–44)
AST: 20 U/L (ref 15–41)
Albumin: 3.6 g/dL (ref 3.5–5.0)
Alkaline Phosphatase: 109 U/L (ref 38–126)
Anion gap: 13 (ref 5–15)
BUN: 8 mg/dL (ref 6–20)
CO2: 29 mmol/L (ref 22–32)
Calcium: 9.3 mg/dL (ref 8.9–10.3)
Chloride: 102 mmol/L (ref 98–111)
Creatinine, Ser: 0.88 mg/dL (ref 0.44–1.00)
GFR calc Af Amer: >60 mL/min (ref >60)
GFR calc non Af Amer: >60 mL/min (ref >60)
Glucose, Bld: 96 mg/dL (ref 70–99)
Potassium: 3.9 mmol/L (ref 3.5–5.1)
Sodium: 144 mmol/L (ref 135–145)
Total Bilirubin: 0.5 mg/dL (ref 0.3–1.2)
Total Protein: 7.6 g/dL (ref 6.5–8.1)

## 2018-08-01 LAB — CBC
HCT: 49.8 % — ABNORMAL HIGH (ref 36.0–46.0)
Hemoglobin: 15.9 g/dL — ABNORMAL HIGH (ref 12.0–15.0)
MCH: 29 pg (ref 26.0–34.0)
MCHC: 31.9 g/dL (ref 30.0–36.0)
MCV: 90.7 fL (ref 78.0–100.0)
Platelets: 207 10*3/uL (ref 150–400)
RBC: 5.49 MIL/uL — ABNORMAL HIGH (ref 3.87–5.11)
RDW: 14.2 % (ref 11.5–15.5)
WBC: 14.7 10*3/uL — ABNORMAL HIGH (ref 4.0–10.5)

## 2018-08-01 LAB — LACTIC ACID, PLASMA
Lactic Acid, Venous: 1.6 mmol/L (ref 0.5–1.9)
Lactic Acid, Venous: 1.2 mmol/L (ref 0.5–1.9)

## 2018-08-01 LAB — I-STAT CG4 LACTIC ACID, ED: Lactic Acid, Venous: 3 mmol/L (ref 0.5–1.9)

## 2018-08-01 LAB — I-STAT TROPONIN, ED: Troponin i, poc: 0.02 ng/mL (ref 0.00–0.08)

## 2018-08-01 LAB — STREP PNEUMONIAE URINARY ANTIGEN: Strep Pneumo Urinary Antigen: NEGATIVE

## 2018-08-01 LAB — I-STAT BETA HCG BLOOD, ED (MC, WL, AP ONLY): I-stat hCG, quantitative: 5.8 m[IU]/mL — ABNORMAL HIGH (ref <5)

## 2018-08-01 MED ORDER — VANCOMYCIN HCL IN DEXTROSE 1-5 GM/200ML-% IV SOLN: 1000.0000 mg | Freq: Two times a day (BID) | INTRAVENOUS | Status: DC

## 2018-08-01 MED ORDER — CLONIDINE HCL 0.1 MG PO TABS
0.1000 mg | ORAL_TABLET | Freq: Every day | ORAL | Status: DC
  Administered 2018-08-01 – 2018-08-03 (×3): 0.1 mg via ORAL
  Filled 2018-08-01 (×2): qty 1

## 2018-08-01 MED ORDER — ACETAMINOPHEN 325 MG PO TABS
650.0000 mg | ORAL_TABLET | Freq: Once | ORAL | Status: AC | PRN
  Administered 2018-08-01: 650 mg via ORAL
  Filled 2018-08-01: qty 2

## 2018-08-01 MED ORDER — ONDANSETRON HCL 4 MG/2ML IJ SOLN: 4.0000 mg | Freq: Four times a day (QID) | INTRAMUSCULAR | Status: DC | PRN

## 2018-08-01 MED ORDER — SODIUM CHLORIDE 0.9 % IV SOLN
1.0000 g | INTRAVENOUS | Status: DC
  Administered 2018-08-01 – 2018-08-04 (×4): 1 g via INTRAVENOUS
  Filled 2018-08-01 (×4): qty 10

## 2018-08-01 MED ORDER — SODIUM CHLORIDE 0.9 % IV SOLN
2.0000 g | Freq: Once | INTRAVENOUS | Status: AC
  Administered 2018-08-01: 2 g via INTRAVENOUS
  Filled 2018-08-01: qty 2

## 2018-08-01 MED ORDER — HYDROCODONE-ACETAMINOPHEN 5-325 MG PO TABS
1.0000 | ORAL_TABLET | Freq: Four times a day (QID) | ORAL | Status: DC | PRN
  Administered 2018-08-01 – 2018-08-04 (×9): 1 via ORAL
  Filled 2018-08-01 (×9): qty 1

## 2018-08-01 MED ORDER — SODIUM CHLORIDE 0.9 % IV BOLUS (SEPSIS)
1000.0000 mL | Freq: Once | INTRAVENOUS | Status: AC
  Administered 2018-08-01: 1000 mL via INTRAVENOUS

## 2018-08-01 MED ORDER — SODIUM CHLORIDE 0.9 % IV SOLN
INTRAVENOUS | Status: AC
  Administered 2018-08-01: 06:00:00 via INTRAVENOUS

## 2018-08-01 MED ORDER — VANCOMYCIN HCL 10 G IV SOLR
2000.0000 mg | Freq: Once | INTRAVENOUS | Status: AC
  Administered 2018-08-01: 2000 mg via INTRAVENOUS
  Filled 2018-08-01 (×2): qty 2000

## 2018-08-01 MED ORDER — SODIUM CHLORIDE 0.9 % IV SOLN
500.0000 mg | INTRAVENOUS | Status: DC
  Administered 2018-08-01 – 2018-08-04 (×4): 500 mg via INTRAVENOUS
  Filled 2018-08-01 (×4): qty 500

## 2018-08-01 MED ORDER — ONDANSETRON HCL 4 MG PO TABS
4.0000 mg | ORAL_TABLET | Freq: Four times a day (QID) | ORAL | Status: DC | PRN
Start: 2018-08-01 — End: 2018-08-04

## 2018-08-01 MED ORDER — HYDROCHLOROTHIAZIDE 25 MG PO TABS
25.0000 mg | ORAL_TABLET | Freq: Every day | ORAL | Status: DC
  Administered 2018-08-01 – 2018-08-04 (×4): 25 mg via ORAL
  Filled 2018-08-01 (×4): qty 1

## 2018-08-01 MED ORDER — CLONIDINE HCL 0.1 MG PO TABS
0.1000 mg | ORAL_TABLET | Freq: Every day | ORAL | Status: DC
  Administered 2018-08-01: 0.1 mg via ORAL
  Filled 2018-08-01 (×2): qty 1

## 2018-08-01 MED ORDER — ACETAMINOPHEN 650 MG RE SUPP
650.0000 mg | Freq: Four times a day (QID) | RECTAL | Status: DC | PRN
Start: 2018-08-01 — End: 2018-08-04

## 2018-08-01 MED ORDER — LOSARTAN POTASSIUM 50 MG PO TABS
100.0000 mg | ORAL_TABLET | Freq: Every day | ORAL | Status: DC
  Administered 2018-08-01 – 2018-08-04 (×4): 100 mg via ORAL
  Filled 2018-08-01 (×4): qty 2

## 2018-08-01 MED ORDER — SODIUM CHLORIDE 0.9 % IV SOLN
1.0000 g | Freq: Three times a day (TID) | INTRAVENOUS | Status: DC
  Filled 2018-08-01: qty 1

## 2018-08-01 MED ORDER — METRONIDAZOLE IN NACL 5-0.79 MG/ML-% IV SOLN: 500.0000 mg | Freq: Three times a day (TID) | INTRAVENOUS | Status: DC

## 2018-08-01 MED ORDER — PANTOPRAZOLE SODIUM 40 MG PO TBEC
40.0000 mg | DELAYED_RELEASE_TABLET | Freq: Every day | ORAL | Status: DC
  Administered 2018-08-01 – 2018-08-04 (×4): 40 mg via ORAL
  Filled 2018-08-01 (×4): qty 1

## 2018-08-01 MED ORDER — LACTATED RINGERS IV SOLN
INTRAVENOUS | Status: AC
  Administered 2018-08-01: 20:00:00 via INTRAVENOUS

## 2018-08-01 MED ORDER — ACETAMINOPHEN 325 MG PO TABS
650.0000 mg | ORAL_TABLET | Freq: Four times a day (QID) | ORAL | Status: DC | PRN
  Filled 2018-08-01: qty 2

## 2018-08-01 MED ORDER — CYCLOBENZAPRINE HCL 10 MG PO TABS
10.0000 mg | ORAL_TABLET | Freq: Three times a day (TID) | ORAL | Status: DC | PRN
  Administered 2018-08-01 – 2018-08-04 (×4): 10 mg via ORAL
  Filled 2018-08-01 (×4): qty 1

## 2018-08-01 MED ORDER — VANCOMYCIN HCL IN DEXTROSE 1-5 GM/200ML-% IV SOLN: 1000.0000 mg | Freq: Once | INTRAVENOUS | Status: DC

## 2018-08-01 MED ORDER — FLUOXETINE HCL 20 MG PO CAPS
60.0000 mg | ORAL_CAPSULE | Freq: Every day | ORAL | Status: DC
  Administered 2018-08-01 – 2018-08-04 (×4): 60 mg via ORAL
  Filled 2018-08-01 (×4): qty 3

## 2018-08-01 MED ORDER — POLYETHYLENE GLYCOL 3350 17 G PO PACK: 17.0000 g | PACK | Freq: Every day | ORAL | Status: DC | PRN

## 2018-08-01 MED ORDER — SODIUM CHLORIDE 0.9 % IV BOLUS (SEPSIS)
500.0000 mL | Freq: Once | INTRAVENOUS | Status: AC
  Administered 2018-08-01: 500 mL via INTRAVENOUS

## 2018-08-01 MED ORDER — GABAPENTIN 300 MG PO CAPS
600.0000 mg | ORAL_CAPSULE | Freq: Three times a day (TID) | ORAL | Status: DC
  Administered 2018-08-01 – 2018-08-04 (×10): 600 mg via ORAL
  Filled 2018-08-01 (×10): qty 2

## 2018-08-01 NOTE — H&P (Addendum)
History and Physical    Megan Dixon WUJ:[id number] DOB: 1966-02-02 DOA: 08/01/2018  PCP: System, Provider Not In   Patient coming from: Home   Chief Complaint: Productive cough, fever, SOB   HPI: Megan Dixon is a 52 y.o. female with medical history significant for hypertension, fibromyalgia, and multiple sclerosis that has been quiesced sent and without any lasting deficits, now presenting to the emergency department for evaluation of productive cough, chills, and shortness of breath.  Patient reports that she developed a malaise approximately 4 days ago, slept most of the day, began to cough the following day with worsening in her general malaise.  She has continued to worsen, cough has been productive of thick green and malodorous sputum, and she has been increasingly short of breath.  She is also developed chills.  Overnight, sputum has been blood-tinged.  She has not been on antibiotics recently.  ED Course: Upon arrival to the ED, patient is found to be febrile to 38.5 C, saturating mid 90s on room air, tachypneic in the 30s, slightly tachycardic, and modestly hypertensive.  EKG features a sinus tachycardia.  Chest x-ray reveals a retrocardiac infiltrate concerning for pneumonia.  Chemistry panel is unremarkable.  CBC features a leukocytosis to 14,700 and a mild polycythemia.  Lactic acid is elevated to 3.00 and troponin is normal.  Blood cultures were collected, 1.5 L of normal saline given, and the patient was started on empiric vancomycin and cefepime.  She remains dyspneic and tachypneic at rest, hemodynamically stable, and will be admitted for ongoing evaluation and management of sepsis secondary to CAP.   Review of Systems:  All other systems reviewed and apart from HPI, are negative.  Past Medical History:  Diagnosis Date  . Arthritis   . Fibromyalgia   . Hypertension   . MS (multiple sclerosis) ([facility])     Past Surgical History:  Procedure Laterality Date  . ABDOMINAL  SURGERY    . HERNIA REPAIR    . JOINT REPLACEMENT    . VENTRAL HERNIA REPAIR N/A 08/09/2017   Procedure: Incarcerated ventral hernia repair with mesh ;  Surgeon: Rolm Bookbinder, MD;  Location: [facility];  Service: General;  Laterality: N/A;     reports that she has been smoking. She has never used smokeless tobacco. She reports that she drinks alcohol. Her drug history is not on file.  Allergies  Allergen Reactions  . Morphine And Related Other (See Comments)    Headache  . Other Other (See Comments)    CANNOT EAT ANYTHING WITH SEEDS (DIVERTICULITIS)  . Peanut-Containing Drug Products Other (See Comments)    Has Diverticulitis   . Toradol [Ketorolac Tromethamine] Anxiety    Family History  Problem Relation Age of Onset  . Lung cancer Mother      Prior to Admission medications   Medication Sig Start Date End Date Taking? Authorizing Provider  acetaminophen (TYLENOL) 325 MG tablet Take 650 mg by mouth every 6 (six) hours as needed for moderate pain or headache.   Yes [provider]  cloNIDine (CATAPRES) 0.1 MG tablet Take 0.1 mg by mouth at bedtime. 12/17/17  Yes [provider]  cyclobenzaprine (FLEXERIL) 10 MG tablet Take 10 mg by mouth 3 (three) times daily as needed for muscle spasms.   Yes [provider]  FLUoxetine (PROZAC) 20 MG capsule Take 60 mg by mouth daily.   Yes [provider]  gabapentin (NEURONTIN) 600 MG tablet Take 600 mg by mouth 3 (three) times daily.  Yes [provider]  HYDROcodone-acetaminophen (NORCO) 5-325 MG tablet Take 1 tablet by mouth every 6 (six) hours as needed for moderate pain. 08/13/17  Yes Kinsinger, Arta Bruce, MD  losartan-hydrochlorothiazide (HYZAAR) 100-25 MG tablet Take 1 tablet by mouth daily.   Yes [provider]  meclizine (ANTIVERT) 25 MG tablet Take 1 tablet (25 mg total) by mouth 3 (three) times daily as needed for dizziness. 02/11/18  Yes Harris, Abigail, PA-C  naproxen sodium  (ANAPROX) 220 MG tablet Take 220-440 mg by mouth 2 (two) times daily as needed (for pain).   Yes [provider]  ondansetron (ZOFRAN ODT) 4 MG disintegrating tablet Take 1 tablet (4 mg total) by mouth every 8 (eight) hours as needed for nausea or vomiting. 08/13/17  Yes Kinsinger, Arta Bruce, MD  pantoprazole (PROTONIX) 40 MG tablet Take 40 mg by mouth daily.   Yes [provider]  polyethylene glycol (MIRALAX) packet Mix 8 cap fulls in 32 ounces of juice/water/gatorade and drink over the course of a day. Once your stool is loose or liquid, stop taking. Patient taking differently: Take 17 g by mouth daily as needed for mild constipation.  02/04/17  Yes Duffy Bruce, MD    Physical Exam: Vitals:   08/01/18 0330 08/01/18 0345 08/01/18 0400 08/01/18 0432  BP: (!) 155/89 (!) 155/86 (!) 154/83   Pulse: (!) 105 (!) 102 (!) 103   Resp: (!) 32 (!) 29 (!) 32   Temp:    99.5 F (37.5 C)  TempSrc:    Oral  SpO2: 97% 97% 96%   Weight:      Height:          Constitutional: Tachypneic, not in acute distress  Eyes: PERTLA, lids and conjunctivae normal ENMT: Mucous membranes are moist. Posterior pharynx clear of any exudate or lesions.   Neck: normal, supple, no masses, no thyromegaly Respiratory: Tachypnea, dyspnea with speech, rhonchi at left base. No accessory muscle use.  Cardiovascular: Rate ~110 and regular. No extremity edema.   Abdomen: No distension, no tenderness, soft. Bowel sounds normal.  Musculoskeletal: no clubbing / cyanosis. No joint deformity upper and lower extremities.    Skin: no significant rashes, lesions, ulcers. Warm, dry, well-perfused. Neurologic: CN 2-12 grossly intact. Sensation intact. Strength 5/5 in all 4 limbs.  Psychiatric: Alert and oriented x 3. Pleasant and cooperative.     Labs on Admission: I have personally reviewed following labs and imaging studies  CBC: Recent Labs  Lab 08/01/18 0215  WBC 14.7*  HGB 15.9*  HCT 49.8*  MCV  90.7  PLT 093   Basic Metabolic Panel: Recent Labs  Lab 08/01/18 0215  NA 144  K 3.9  CL 102  CO2 29  GLUCOSE 96  BUN 8  CREATININE 0.88  CALCIUM 9.3   GFR: Estimated Creatinine Clearance: 83.3 mL/min (by C-G formula based on SCr of 0.88 mg/dL). Liver Function Tests: Recent Labs  Lab 08/01/18 0215  AST 20  ALT 13  ALKPHOS 109  BILITOT 0.5  PROT 7.6  ALBUMIN 3.6   No results for input(s): LIPASE, AMYLASE in the last 168 hours. No results for input(s): AMMONIA in the last 168 hours. Coagulation Profile: No results for input(s): INR, PROTIME in the last 168 hours. Cardiac Enzymes: No results for input(s): CKTOTAL, CKMB, CKMBINDEX, TROPONINI in the last 168 hours. BNP (last 3 results) No results for input(s): PROBNP in the last 8760 hours. HbA1C: No results for input(s): HGBA1C in the last 72 hours. CBG:  No results for input(s): GLUCAP in the last 168 hours. Lipid Profile: No results for input(s): CHOL, HDL, LDLCALC, TRIG, CHOLHDL, LDLDIRECT in the last 72 hours. Thyroid Function Tests: No results for input(s): TSH, T4TOTAL, FREET4, T3FREE, THYROIDAB in the last 72 hours. Anemia Panel: No results for input(s): VITAMINB12, FOLATE, FERRITIN, TIBC, IRON, RETICCTPCT in the last 72 hours. Urine analysis:    Component Value Date/Time   COLORURINE YELLOW 08/09/2017 2043   APPEARANCEUR CLOUDY (A) 08/09/2017 2043   LABSPEC 1.011 08/09/2017 2043   PHURINE 8.0 08/09/2017 2043   GLUCOSEU NEGATIVE 08/09/2017 2043   HGBUR NEGATIVE 08/09/2017 2043   [place] NEGATIVE 08/09/2017 2043   [place] 08/09/2017 2043   PROTEINUR 30 (A) 08/09/2017 2043   NITRITE NEGATIVE 08/09/2017 2043   LEUKOCYTESUR NEGATIVE 08/09/2017 2043   Sepsis Labs: @LABRCNTIP (procalcitonin:4,lacticidven:4) )No results found for this or any previous visit (from the past 240 hour(s)).   Radiological Exams on Admission: Dg Chest 2 View  Result Date: 08/01/2018 CLINICAL DATA:  Productive  cough with chills since Friday. EXAM: CHEST - 2 VIEW COMPARISON:  11/11/2015 FINDINGS: Heart size and pulmonary vascularity are normal. Suggestion of focal infiltration in the left lung base behind the heart may represent early pneumonia. Right lung is clear. No blunting of costophrenic angles. No pneumothorax. Mediastinal contours appear intact. IMPRESSION: Suggestion of focal infiltration in the left lung base behind the heart, possible pneumonia. Electronically Signed   By: Lucienne Capers M.D.   On: 08/01/2018 02:49    EKG: Independently reviewed. Sinus tachycardia, ?LAE.   Assessment/Plan   1. Sepsis secondary to PNA  - Presents with 3-4 days of malaise, productive cough, SOB, and chills  - Found to be febrile, tachycardic, and tachypneic with leukocytosis and elevated lactate  - CXR with retrocardiac opacity concerning for PNA  - Blood cultures were collected in ED, 1.5 liters NS given, and she was treated with vancomycin and cefepime  - Check sputum culture and strep pneumo antigen, trend lactate, and continue empiric antibiotics with Rocephin and azithromycin for CAP    2. Hypertension  - BP elevated in ED  - Continue clonidine and losartan as tolerated  - Hold HCTZ while hydrating    3. Fibromyalgia  - Continue Prozac and prn Flexeril    4. Multiple sclerosis  - This has been quiescent for years and she denies any chronic deficit    DVT prophylaxis: SCD's  Code Status: Full  Family Communication: Discussed with patient  Consults called: None  Admission status: Observation    Vianne Bulls, MD [facility] Pager [phone]  If 7PM-7AM, please contact night-coverage [email or web address] Password [email or web address]  08/01/2018, 5:03 AM

## 2018-08-01 NOTE — Care Management Obs Status (Signed)
[place] [facility] NOTIFICATION   Patient Details  Name: Myelle Poteat MRN: [id number] Date of Birth: Jun 08, 1966   Medicare Observation Status Notification Given:       Ninfa Meeker, RN 08/01/2018, 11:44 AM

## 2018-08-01 NOTE — ED Notes (Signed)
Call sister, Kary Kos, with any updates on pt. [phone]

## 2018-08-01 NOTE — ED Notes (Signed)
Dr Ellender Hose is informed of lactic acid results 3.00

## 2018-08-01 NOTE — Progress Notes (Signed)
Spoke with Caren Griffins RN and made aware that Vascular access team can draw labs if the patient has a central line, picc line, or midline. If not Lab would need to be consulted

## 2018-08-01 NOTE — Care Management Obs Status (Signed)
[place] NOTIFICATION   Patient Details  Name: Megan Dixon MRN: [id number] Date of Birth: [birth year]   Medicare Observation Status Notification Given:  Yes    Ninfa Meeker, RN 08/01/2018, 11:56 AM

## 2018-08-01 NOTE — ED Provider Notes (Signed)
Emergency Ultrasound Study:   Angiocath insertion Performed by: Evonnie Pat Consent: Verbal consent/emergent consent obtained. Risks and benefits: risks, benefits and alternatives were discussed Immediately prior to procedure the correct patient, procedure, equipment, support staff and site/side marked as needed.  Indication: difficult IV access Preparation: Patient was prepped and draped in the usual sterile fashion. Sterile gel was used for this procedure and the ultrasound probe was sterilized prior to use. Vein Location: Right forearm vein was visualized during assessment for potential access sites and was found to be patent/ easily compressed with linear ultrasound.  The needle was visualized with real-time ultrasound and guided into the vein. Gauge: 20  Image saved and stored.  Normal blood return.   Patient tolerance: Patient tolerated the procedure well with no immediate complications.        Duffy Bruce, MD 08/01/18 [phone]

## 2018-08-01 NOTE — Progress Notes (Signed)
Pharmacy Antibiotic Note  Megan Dixon is a 52 y.o. female admitted on 08/01/2018 with sepsis.  Pharmacy has been consulted for Vancomycin/Cefepime dosing. WBC is elevated. Renal function good. Lactic acid elevated. Pt is febrile up to 101.3. CXR with possible PNA.   Plan: Vancomycin 2000 mg IV x 1, then 1000 mg IV q12h Cefepime 1g IV q8h Trend WBC, temp, renal function  F/U infectious work-up Drug levels as indicated   Height: 5\' 1"  (154.9 cm) Weight: 240 lb (108.9 kg) IBW/kg (Calculated) : 47.8  Temp (24hrs), Avg:101.3 F (38.5 C), Min:101.3 F (38.5 C), Max:101.3 F (38.5 C)  Recent Labs  Lab 08/01/18 0215 08/01/18 0225  WBC 14.7*  --   CREATININE 0.88  --   LATICACIDVEN  --  3.00*    Estimated Creatinine Clearance: 83.3 mL/min (by C-G formula based on SCr of 0.88 mg/dL).    Allergies  Allergen Reactions  . Morphine And Related Other (See Comments)    Headache  . Other Other (See Comments)    CANNOT EAT ANYTHING WITH SEEDS (DIVERTICULITIS)  . Peanut-Containing Drug Products Other (See Comments)    Has Diverticulitis   . Toradol [Ketorolac Tromethamine] Anxiety    Narda Bonds 08/01/2018 3:33 AM

## 2018-08-01 NOTE — ED Provider Notes (Signed)
[facility] EMERGENCY DEPARTMENT Provider Note   CSN: [id number] Arrival date & time: 08/01/18  0202     History   Chief Complaint Chief Complaint  Patient presents with  . Chest Pain  . Shortness of Breath  . Cough    HPI Racquel Arkin is a 52 y.o. female.  Patient with past medical history remarkable for fibromyalgia, MS, hypertension presents to the emergency department with a chief complaint of cough.  She states that she has been having fevers and chills for the past couple of days.  She reports that her cough worsened today, and states that she saw some blood in her sputum.  She has not taken anything for her symptoms.  She states that she feels achy all over.  She does not take any immunosuppressive medications.  The history is provided by the patient. No language interpreter was used.    Past Medical History:  Diagnosis Date  . Arthritis   . Fibromyalgia   . Hypertension   . MS (multiple sclerosis) [facility])     Patient Active Problem List   Diagnosis Date Noted  . Incarcerated incisional hernia 08/10/2017  . S/P exploratory laparotomy 08/09/2017    Past Surgical History:  Procedure Laterality Date  . ABDOMINAL SURGERY    . HERNIA REPAIR    . JOINT REPLACEMENT    . VENTRAL HERNIA REPAIR N/A 08/09/2017   Procedure: Incarcerated ventral hernia repair with mesh ;  Surgeon: Rolm Bookbinder, MD;  Location: [facility];  Service: General;  Laterality: N/A;     OB History   None      Home Medications    Prior to Admission medications   Medication Sig Start Date End Date Taking? Authorizing Provider  acetaminophen (TYLENOL) 325 MG tablet Take 650 mg by mouth every 6 (six) hours as needed for moderate pain or headache.    [provider]  cloNIDine (CATAPRES) 0.1 MG tablet Take 0.1 mg by mouth at bedtime. 12/17/17   [provider]  cyclobenzaprine (FLEXERIL) 10 MG tablet Take 10 mg by mouth 3 (three) times daily as needed for  muscle spasms.    [provider]  FLUoxetine (PROZAC) 20 MG capsule Take 60 mg by mouth daily.    [provider]  gabapentin (NEURONTIN) 600 MG tablet Take 600 mg by mouth 3 (three) times daily.     [provider]  HYDROcodone-acetaminophen (NORCO) 5-325 MG tablet Take 1 tablet by mouth every 6 (six) hours as needed for moderate pain. 08/13/17   Kinsinger, Arta Bruce, MD  losartan-hydrochlorothiazide (HYZAAR) 100-25 MG tablet Take 1 tablet by mouth daily.    [provider]  meclizine (ANTIVERT) 25 MG tablet Take 1 tablet (25 mg total) by mouth 3 (three) times daily as needed for dizziness. 02/11/18   Harris, Vernie Shanks, PA-C  naproxen sodium (ANAPROX) 220 MG tablet Take 220-440 mg by mouth 2 (two) times daily as needed (for pain).    [provider]  ondansetron (ZOFRAN ODT) 4 MG disintegrating tablet Take 1 tablet (4 mg total) by mouth every 8 (eight) hours as needed for nausea or vomiting. 08/13/17   Kinsinger, Arta Bruce, MD  pantoprazole (PROTONIX) 40 MG tablet Take 40 mg by mouth daily.    [provider]  polyethylene glycol (MIRALAX) packet Mix 8 cap fulls in 32 ounces of juice/water/gatorade and drink over the course of a day. Once your stool is loose or liquid, stop taking. Patient taking differently: Take 17 g  by mouth daily as needed for mild constipation.  02/04/17   Duffy Bruce, MD    Family History No family history on file.  Social History Social History   Tobacco Use  . Smoking status: Current Every Day Smoker  . Smokeless tobacco: Never Used  Substance Use Topics  . Alcohol use: Yes  . Drug use: Not on file     Allergies   Morphine and related; Other; Peanut-containing drug products; and Toradol [ketorolac tromethamine]   Review of Systems Review of Systems  All other systems reviewed and are negative.    Physical Exam Updated Vital Signs BP (!) 186/93 (BP Location: Right Arm)   Pulse (!) 102   Temp (!)  101.3 F (38.5 C) (Oral)   Resp 16   Ht 5\' 1"  (1.549 m)   Wt 108.9 kg   SpO2 100%   BMI 45.35 kg/m   Physical Exam  Constitutional: She is oriented to person, place, and time. She appears well-developed and well-nourished.  HENT:  Head: Normocephalic and atraumatic.  Eyes: Pupils are equal, round, and reactive to light. Conjunctivae and EOM are normal.  Neck: Normal range of motion. Neck supple.  Cardiovascular: Normal rate and regular rhythm. Exam reveals no gallop and no friction rub.  No murmur heard. Pulmonary/Chest: Effort normal. No respiratory distress. She has no wheezes. She has rales. She exhibits no tenderness.  Abdominal: Soft. Bowel sounds are normal. She exhibits no distension and no mass. There is no tenderness. There is no rebound and no guarding.  Musculoskeletal: Normal range of motion. She exhibits no edema or tenderness.  Neurological: She is alert and oriented to person, place, and time.  Skin: Skin is warm and dry.  Psychiatric: She has a normal mood and affect. Her behavior is normal. Judgment and thought content normal.  Nursing note and vitals reviewed.    ED Treatments / Results  Labs (all labs ordered are listed, but only abnormal results are displayed) Labs Reviewed  CBC - Abnormal; Notable for the following components:      Result Value   WBC 14.7 (*)    RBC 5.49 (*)    Hemoglobin 15.9 (*)    HCT 49.8 (*)    All other components within normal limits  I-STAT BETA HCG BLOOD, ED ([facility], [facility], [facility] ONLY) - Abnormal; Notable for the following components:   I-stat hCG, quantitative 5.8 (*)    All other components within normal limits  I-STAT CG4 LACTIC ACID, ED - Abnormal; Notable for the following components:   Lactic Acid, Venous 3.00 (*)    All other components within normal limits  CULTURE, BLOOD (ROUTINE X 2)  CULTURE, BLOOD (ROUTINE X 2)  COMPREHENSIVE METABOLIC PANEL  I-STAT TROPONIN, ED    EKG EKG Interpretation  Date/Time:  Monday August 01 2018 02:10:20 EDT Ventricular Rate:  97 PR Interval:  130 QRS Duration: 86 QT Interval:  356 QTC Calculation: 452 R Axis:   26 Text Interpretation:  Normal sinus rhythm Possible Left atrial enlargement Nonspecific T wave abnormality Abnormal ECG Since last EKG, rate has increased Otherwise no significant change Confirmed by Duffy Bruce [phone]) on 08/01/2018 2:28:05 AM   Radiology Dg Chest 2 View  Result Date: 08/01/2018 CLINICAL DATA:  Productive cough with chills since Friday. EXAM: CHEST - 2 VIEW COMPARISON:  11/11/2015 FINDINGS: Heart size and pulmonary vascularity are normal. Suggestion of focal infiltration in the left lung base behind the heart may represent early pneumonia. Right lung is clear.  No blunting of costophrenic angles. No pneumothorax. Mediastinal contours appear intact. IMPRESSION: Suggestion of focal infiltration in the left lung base behind the heart, possible pneumonia. Electronically Signed   By: Lucienne Capers M.D.   On: 08/01/2018 02:49    Procedures Procedures (including critical care time) CRITICAL CARE Performed by: Montine Circle   Total critical care time: 40 minutes  Critical care time was exclusive of separately billable procedures and treating other patients.  Critical care was necessary to treat or prevent imminent or life-threatening deterioration.  Critical care was time spent personally by me on the following activities: development of treatment plan with patient and/or surrogate as well as nursing, discussions with consultants, evaluation of patient's response to treatment, examination of patient, obtaining history from patient or surrogate, ordering and performing treatments and interventions, ordering and review of laboratory studies, ordering and review of radiographic studies, pulse oximetry and re-evaluation of patient's condition.  Medications Ordered in ED Medications  ceFEPIme (MAXIPIME) 2 g in sodium chloride 0.9 % 100 mL IVPB  (has no administration in time range)  metroNIDAZOLE (FLAGYL) IVPB 500 mg (has no administration in time range)  vancomycin (VANCOCIN) IVPB 1000 mg/200 mL premix (has no administration in time range)  sodium chloride 0.9 % bolus 1,000 mL (has no administration in time range)    And  sodium chloride 0.9 % bolus 500 mL (has no administration in time range)  acetaminophen (TYLENOL) tablet 650 mg (650 mg Oral Given 08/01/18 6812)     Initial Impression / Assessment and Plan / ED Course  I have reviewed the triage vital signs and the nursing notes.  Pertinent labs & imaging results that were available during my care of the patient were reviewed by me and considered in my medical decision making (see chart for details).     Patient with fevers, chills, and cough.  She also reports feeling short of breath and having some chest pain.  Reports having some blood in her sputum tonight.  She has MS, but does not take any immunosuppressants.  She is noted to have fever to 101.3 in triage.  She is mildly tachycardic mildly tachypneic.  Code sepsis activated.  We will give weight-based fluids per ideal body weight.  Lactate is 3.0.  Moderate leukocytosis of 14.7.  Chest x-ray shows probable pneumonia.  Patient seen by discussed with Dr. Ellender Hose.  Appreciate Dr. Myna Hidalgo for bringing patient into the hospital.  MDM Reviewed: nursing note and vitals (Tachycardic, tachypneic, febrile to 101.3) Reviewed previous: labs Interpretation: labs, ECG and x-ray (Leukocytosis to 14.7, lactate 3.0, left-sided opacity on chest x-ray) Total time providing critical care: 30-74 minutes (40). This excludes time spent performing separately reportable procedures and services. Consults: admitting MD    Final Clinical Impressions(s) / ED Diagnoses   Final diagnoses:  Community acquired pneumonia of left lung, unspecified part of lung  Sepsis, due to unspecified organism [facility])    ED Discharge Orders    None         Montine Circle, PA-C 08/01/18 0441    Duffy Bruce, MD 08/01/18 [phone]

## 2018-08-01 NOTE — Progress Notes (Signed)
PROGRESS NOTE    Megan Dixon  JJK:[id number] DOB: [birth year] DOA: 08/01/2018 PCP: System, Provider Not In   Brief Narrative:  Megan Dixon is Megan Dixon 52 y.o. female with medical history significant for hypertension, fibromyalgia, and multiple sclerosis that has been quiesced sent and without any lasting deficits, now presenting to the emergency department for evaluation of productive cough, chills, and shortness of breath.  Patient reports that she developed Kallista Pae malaise approximately 4 days ago, slept most of the day, began to cough the following day with worsening in her general malaise.  She has continued to worsen, cough has been productive of thick green and malodorous sputum, and she has been increasingly short of breath.  She is also developed chills.  Overnight, sputum has been blood-tinged.  She has not been on antibiotics recently.  Assessment & Plan:   Principal Problem:   Sepsis due to pneumonia [facility]) Active Problems:   MS (multiple sclerosis) ([place])   Hypertension   Fibromyalgia   Blood-tinged sputum   1. Sepsis secondary to Community Acquired Pneumonia  - Presents with 3-4 days of malaise, productive cough, SOB, and chills  - Found to be febrile, tachycardic, and tachypneic with leukocytosis and elevated lactate  - CXR with retrocardiac opacity concerning for PNA  - Blood cx pending - Sputum cx, urine strep - Lactate normalized - Ceftriaxone/azithromycin   2. Hypertension  - BP elevated, follow, will restart HCTZ - Continue clonidine and losartan as tolerated   3. Fibromyalgia  - Continue Prozac and prn Flexeril    4. Multiple sclerosis  - This has been quiescent for years and she denies any chronic deficit    DVT prophylaxis: SCD Code Status: full  Family Communication: none at bedside Disposition Plan: hopefully within 24 hours   Consultants:   none  Procedures:   none  Antimicrobials:  Anti-infectives (From admission, onward)   Start      Dose/Rate Route Frequency Ordered Stop   08/01/18 1600  vancomycin (VANCOCIN) IVPB 1000 mg/200 mL premix  Status:  Discontinued     1,000 mg 200 mL/hr over 60 Minutes Intravenous Every 12 hours 08/01/18 0336 08/01/18 0503   08/01/18 1200  ceFEPIme (MAXIPIME) 1 g in sodium chloride 0.9 % 100 mL IVPB  Status:  Discontinued     1 g 200 mL/hr over 30 Minutes Intravenous Every 8 hours 08/01/18 0337 08/01/18 0503   08/01/18 1000  cefTRIAXone (ROCEPHIN) 1 g in sodium chloride 0.9 % 100 mL IVPB     1 g 200 mL/hr over 30 Minutes Intravenous Every 24 hours 08/01/18 0503 08/08/18 0959   08/01/18 1000  azithromycin (ZITHROMAX) 500 mg in sodium chloride 0.9 % 250 mL IVPB     500 mg 250 mL/hr over 60 Minutes Intravenous Every 24 hours 08/01/18 0503 08/08/18 0959   08/01/18 0345  vancomycin (VANCOCIN) 2,000 mg in sodium chloride 0.9 % 500 mL IVPB     2,000 mg 250 mL/hr over 120 Minutes Intravenous  Once 08/01/18 0336 08/01/18 0606   08/01/18 0230  ceFEPIme (MAXIPIME) 2 g in sodium chloride 0.9 % 100 mL IVPB     2 g 200 mL/hr over 30 Minutes Intravenous  Once 08/01/18 0228 08/01/18 0412   08/01/18 0230  metroNIDAZOLE (FLAGYL) IVPB 500 mg  Status:  Discontinued     500 mg 100 mL/hr over 60 Minutes Intravenous Every 8 hours 08/01/18 0228 08/01/18 0316   08/01/18 0230  vancomycin (VANCOCIN) IVPB 1000 mg/200 mL premix  Status:  Discontinued  1,000 mg 200 mL/hr over 60 Minutes Intravenous  Once 08/01/18 0228 08/01/18 0335         Subjective: Feels crummy.  Body aches.  Past few days, coughing up blood recently.  Denies TB risk factors.  Objective: Vitals:   08/01/18 0630 08/01/18 0700 08/01/18 0811 08/01/18 1302  BP: (!) 149/95 (!) 156/93 (!) 149/92 (!) 164/96  Pulse:  (!) 106 95 (!) 109  Resp:  (!) 34 20   Temp:   99.3 F (37.4 C) 99.1 F (37.3 C)  TempSrc:   Oral Oral  SpO2:  94% 98% 97%  Weight:      Height:        Intake/Output Summary (Last 24 hours) at 08/01/2018 1732 Last data  filed at 08/01/2018 1700 Gross per 24 hour  Intake 2460 ml  Output 1000 ml  Net 1460 ml   Filed Weights   08/01/18 0200  Weight: 108.9 kg    Examination:  General exam: Appears calm and comfortable  Respiratory system: Clear to auscultation. Respiratory effort normal. Cardiovascular system: S1 & S2 heard, RRR. Gastrointestinal system: Abdomen is nondistended, soft and nontender.  Central nervous system: Alert and oriented. No focal neurological deficits. Extremities: Symmetric 5 x 5 power. Skin: No rashes, lesions or ulcers Psychiatry: Judgement and insight appear normal. Mood & affect appropriate.     Data Reviewed: I have personally reviewed following labs and imaging studies  CBC: Recent Labs  Lab 08/01/18 0215  WBC 14.7*  HGB 15.9*  HCT 49.8*  MCV 90.7  PLT 106   Basic Metabolic Panel: Recent Labs  Lab 08/01/18 0215  NA 144  K 3.9  CL 102  CO2 29  GLUCOSE 96  BUN 8  CREATININE 0.88  CALCIUM 9.3   GFR: Estimated Creatinine Clearance: 83.3 mL/min (by C-G formula based on SCr of 0.88 mg/dL). Liver Function Tests: Recent Labs  Lab 08/01/18 0215  AST 20  ALT 13  ALKPHOS 109  BILITOT 0.5  PROT 7.6  ALBUMIN 3.6   No results for input(s): LIPASE, AMYLASE in the last 168 hours. No results for input(s): AMMONIA in the last 168 hours. Coagulation Profile: No results for input(s): INR, PROTIME in the last 168 hours. Cardiac Enzymes: No results for input(s): CKTOTAL, CKMB, CKMBINDEX, TROPONINI in the last 168 hours. BNP (last 3 results) No results for input(s): PROBNP in the last 8760 hours. HbA1C: No results for input(s): HGBA1C in the last 72 hours. CBG: No results for input(s): GLUCAP in the last 168 hours. Lipid Profile: No results for input(s): CHOL, HDL, LDLCALC, TRIG, CHOLHDL, LDLDIRECT in the last 72 hours. Thyroid Function Tests: No results for input(s): TSH, T4TOTAL, FREET4, T3FREE, THYROIDAB in the last 72 hours. Anemia Panel: No results  for input(s): VITAMINB12, FOLATE, FERRITIN, TIBC, IRON, RETICCTPCT in the last 72 hours. Sepsis Labs: Recent Labs  Lab 08/01/18 0225 08/01/18 0547 08/01/18 0822  LATICACIDVEN 3.00* 1.6 1.2    No results found for this or any previous visit (from the past 240 hour(s)).       Radiology Studies: Dg Chest 2 View  Result Date: 08/01/2018 CLINICAL DATA:  Productive cough with chills since Friday. EXAM: CHEST - 2 VIEW COMPARISON:  11/11/2015 FINDINGS: Heart size and pulmonary vascularity are normal. Suggestion of focal infiltration in the left lung base behind the heart may represent early pneumonia. Right lung is clear. No blunting of costophrenic angles. No pneumothorax. Mediastinal contours appear intact. IMPRESSION: Suggestion of focal infiltration in the left lung  base behind the heart, possible pneumonia. Electronically Signed   By: Lucienne Capers M.D.   On: 08/01/2018 02:49        Scheduled Meds: . cloNIDine  0.1 mg Oral QHS  . FLUoxetine  60 mg Oral Daily  . gabapentin  600 mg Oral TID  . losartan  100 mg Oral Daily  . pantoprazole  40 mg Oral Daily   Continuous Infusions: . azithromycin 500 mg (08/01/18 1146)  . cefTRIAXone (ROCEPHIN)  IV 1 g (08/01/18 1031)     LOS: 0 days    Time spent: over 30 min    Fayrene Helper, MD [facility] [phone]  If 7PM-7AM, please contact night-coverage [email or web address] Password [facility] 08/01/2018, 5:32 PM

## 2018-08-01 NOTE — ED Notes (Addendum)
Comment code was entered wrong. Should have been code 300 not 30. Informed Dr Ellender Hose a POC sheet filled out and sent down to lab.

## 2018-08-01 NOTE — ED Triage Notes (Signed)
Pt to triage via [facility].  Reports productive cough with green and yellow phlegm and chills since Friday.  Reports dull and sharp pain to center of chest since 5pm yesterday.  Also reports coughing bright red blood this evening.  EMS administered ASA 324mg .

## 2018-08-02 ENCOUNTER — Observation Stay (HOSPITAL_COMMUNITY): Payer: Medicare Other

## 2018-08-02 DIAGNOSIS — Z885 Allergy status to narcotic agent status: Secondary | ICD-10-CM | POA: Diagnosis not present

## 2018-08-02 DIAGNOSIS — D751 Secondary polycythemia: Secondary | ICD-10-CM | POA: Diagnosis present

## 2018-08-02 DIAGNOSIS — R509 Fever, unspecified: Secondary | ICD-10-CM | POA: Diagnosis present

## 2018-08-02 DIAGNOSIS — E876 Hypokalemia: Secondary | ICD-10-CM | POA: Diagnosis present

## 2018-08-02 DIAGNOSIS — J90 Pleural effusion, not elsewhere classified: Secondary | ICD-10-CM | POA: Diagnosis not present

## 2018-08-02 DIAGNOSIS — Z79899 Other long term (current) drug therapy: Secondary | ICD-10-CM | POA: Diagnosis not present

## 2018-08-02 DIAGNOSIS — M797 Fibromyalgia: Secondary | ICD-10-CM | POA: Diagnosis present

## 2018-08-02 DIAGNOSIS — J189 Pneumonia, unspecified organism: Secondary | ICD-10-CM | POA: Diagnosis not present

## 2018-08-02 DIAGNOSIS — G35 Multiple sclerosis: Secondary | ICD-10-CM | POA: Diagnosis present

## 2018-08-02 DIAGNOSIS — G8929 Other chronic pain: Secondary | ICD-10-CM | POA: Diagnosis present

## 2018-08-02 DIAGNOSIS — F419 Anxiety disorder, unspecified: Secondary | ICD-10-CM | POA: Diagnosis present

## 2018-08-02 DIAGNOSIS — Z91010 Allergy to peanuts: Secondary | ICD-10-CM | POA: Diagnosis not present

## 2018-08-02 DIAGNOSIS — F329 Major depressive disorder, single episode, unspecified: Secondary | ICD-10-CM | POA: Diagnosis present

## 2018-08-02 DIAGNOSIS — J181 Lobar pneumonia, unspecified organism: Secondary | ICD-10-CM | POA: Diagnosis present

## 2018-08-02 DIAGNOSIS — M1990 Unspecified osteoarthritis, unspecified site: Secondary | ICD-10-CM | POA: Diagnosis present

## 2018-08-02 DIAGNOSIS — I10 Essential (primary) hypertension: Secondary | ICD-10-CM | POA: Diagnosis present

## 2018-08-02 DIAGNOSIS — R918 Other nonspecific abnormal finding of lung field: Secondary | ICD-10-CM | POA: Diagnosis not present

## 2018-08-02 DIAGNOSIS — A419 Sepsis, unspecified organism: Secondary | ICD-10-CM | POA: Diagnosis not present

## 2018-08-02 DIAGNOSIS — Z6841 Body Mass Index (BMI) 40.0 and over, adult: Secondary | ICD-10-CM | POA: Diagnosis not present

## 2018-08-02 DIAGNOSIS — F17200 Nicotine dependence, unspecified, uncomplicated: Secondary | ICD-10-CM | POA: Diagnosis present

## 2018-08-02 DIAGNOSIS — Z886 Allergy status to analgesic agent status: Secondary | ICD-10-CM | POA: Diagnosis not present

## 2018-08-02 LAB — CBC
HCT: 40.1 % (ref 36.0–46.0)
Hemoglobin: 12.8 g/dL (ref 12.0–15.0)
MCH: 28.4 pg (ref 26.0–34.0)
MCHC: 31.9 g/dL (ref 30.0–36.0)
MCV: 88.9 fL (ref 78.0–100.0)
Platelets: 195 10*3/uL (ref 150–400)
RBC: 4.51 MIL/uL (ref 3.87–5.11)
RDW: 14.6 % (ref 11.5–15.5)
WBC: 18.4 10*3/uL — ABNORMAL HIGH (ref 4.0–10.5)

## 2018-08-02 LAB — BASIC METABOLIC PANEL
Anion gap: 8 (ref 5–15)
BUN: 7 mg/dL (ref 6–20)
CO2: 26 mmol/L (ref 22–32)
Calcium: 8.5 mg/dL — ABNORMAL LOW (ref 8.9–10.3)
Chloride: 104 mmol/L (ref 98–111)
Creatinine, Ser: 0.97 mg/dL (ref 0.44–1.00)
GFR calc Af Amer: >60 mL/min (ref >60)
GFR calc non Af Amer: >60 mL/min (ref >60)
Glucose, Bld: 111 mg/dL — ABNORMAL HIGH (ref 70–99)
Potassium: 3.4 mmol/L — ABNORMAL LOW (ref 3.5–5.1)
Sodium: 138 mmol/L (ref 135–145)

## 2018-08-02 LAB — MAGNESIUM: Magnesium: 1.9 mg/dL (ref 1.7–2.4)

## 2018-08-02 LAB — HIV ANTIBODY (ROUTINE TESTING W REFLEX): HIV Screen 4th Generation wRfx: NONREACTIVE

## 2018-08-02 MED ORDER — POTASSIUM CHLORIDE CRYS ER 20 MEQ PO TBCR
40.0000 meq | EXTENDED_RELEASE_TABLET | Freq: Once | ORAL | Status: AC
  Administered 2018-08-02: 40 meq via ORAL
  Filled 2018-08-02: qty 2

## 2018-08-02 MED ORDER — AMLODIPINE BESYLATE 5 MG PO TABS
5.0000 mg | ORAL_TABLET | Freq: Every day | ORAL | Status: DC
  Administered 2018-08-02 – 2018-08-04 (×3): 5 mg via ORAL
  Filled 2018-08-02 (×3): qty 1

## 2018-08-02 NOTE — Plan of Care (Signed)

## 2018-08-02 NOTE — Progress Notes (Signed)
Patients blood pressure 167/107 at this time.  MD aware.  No new orders received.  Will continue to monitor patient

## 2018-08-02 NOTE — Progress Notes (Addendum)
PROGRESS NOTE    Megan Dixon  KGM:[id number] DOB: Sep 25, 1966 DOA: 08/01/2018 PCP: System, Provider Not In   Brief Narrative:  Megan Dixon is Megan Dixon 52 y.o. female with medical history significant for hypertension, fibromyalgia, and multiple sclerosis that has been quiesced sent and without any lasting deficits, now presenting to the emergency department for evaluation of productive cough, chills, and shortness of breath.  Patient reports that she developed [name] malaise approximately 4 days ago, slept most of the day, began to cough the following day with worsening in her general malaise.  She has continued to worsen, cough has been productive of thick green and malodorous sputum, and she has been increasingly short of breath.  She is also developed chills.  Overnight, sputum has been blood-tinged.  She has not been on antibiotics recently.  Assessment & Plan:   Principal Problem:   Sepsis due to pneumonia [facility]) Active Problems:   MS (multiple sclerosis) ([place])   Hypertension   Fibromyalgia   Blood-tinged sputum   1. Sepsis secondary to Community Acquired Pneumonia  Hemoptysis - Presents with 3-4 days of malaise, productive cough, SOB, and chills  - also with hemoptysis which is most likely due to pneumonia - notes hemoptysis may be slightly worse (small volume) - CXR with retrocardiac opacity concerning for PNA  - Repeat CXR today -> consider CT - Blood cx pending - Sputum cx, urine strep - Lactate normalized - Ceftriaxone/azithromycin, continue IV abx at this time  2. Hypertension  - BP elevated, follow - Continue clonidine, HCTZ, losartan as tolerated  - adding amlodipine as BP's continue to be elevated.  3. Fibromyalgia  - Continue Prozac and prn Flexeril    4. Multiple sclerosis  - This has been quiescent for years and she denies any chronic deficit.  Stable.  # Hypokalemia:  Replete, follow  # Leukocytosis: 2/2 above, worsening today, follow with abx  DVT  prophylaxis: SCD Code Status: full  Family Communication: none at bedside Disposition Plan: hopefully within 24 hours   Consultants:   none  Procedures:   none  Antimicrobials:  Anti-infectives (From admission, onward)   Start     Dose/Rate Route Frequency Ordered Stop   08/01/18 1600  vancomycin (VANCOCIN) IVPB 1000 mg/200 mL premix  Status:  Discontinued     1,000 mg 200 mL/hr over 60 Minutes Intravenous Every 12 hours 08/01/18 0336 08/01/18 0503   08/01/18 1200  ceFEPIme (MAXIPIME) 1 g in sodium chloride 0.9 % 100 mL IVPB  Status:  Discontinued     1 g 200 mL/hr over 30 Minutes Intravenous Every 8 hours 08/01/18 0337 08/01/18 0503   08/01/18 1000  cefTRIAXone (ROCEPHIN) 1 g in sodium chloride 0.9 % 100 mL IVPB     1 g 200 mL/hr over 30 Minutes Intravenous Every 24 hours 08/01/18 0503 08/08/18 0959   08/01/18 1000  azithromycin (ZITHROMAX) 500 mg in sodium chloride 0.9 % 250 mL IVPB     500 mg 250 mL/hr over 60 Minutes Intravenous Every 24 hours 08/01/18 0503 08/06/18 0959   08/01/18 0345  vancomycin (VANCOCIN) 2,000 mg in sodium chloride 0.9 % 500 mL IVPB     2,000 mg 250 mL/hr over 120 Minutes Intravenous  Once 08/01/18 0336 08/01/18 0606   08/01/18 0230  ceFEPIme (MAXIPIME) 2 g in sodium chloride 0.9 % 100 mL IVPB     2 g 200 mL/hr over 30 Minutes Intravenous  Once 08/01/18 0228 08/01/18 0412   08/01/18 0230  metroNIDAZOLE (FLAGYL) IVPB  500 mg  Status:  Discontinued     500 mg 100 mL/hr over 60 Minutes Intravenous Every 8 hours 08/01/18 0228 08/01/18 0316   08/01/18 0230  vancomycin (VANCOCIN) IVPB 1000 mg/200 mL premix  Status:  Discontinued     1,000 mg 200 mL/hr over 60 Minutes Intravenous  Once 08/01/18 0228 08/01/18 0335         Subjective: Still feels poorly.   Coughing up maybe Megan Dixon bit more blood into tissue.  Objective: Vitals:   08/01/18 1302 08/01/18 2013 08/01/18 2314 08/02/18 0416  BP: (!) 164/96 (!) 151/93 (!) 162/94 (!) 148/96  Pulse: (!) 109 (!)  110  99  Resp:  16  16  Temp: 99.1 F (37.3 C) 99 F (37.2 C)  98.5 F (36.9 C)  TempSrc: Oral Oral  Oral  SpO2: 97% 99%  97%  Weight:      Height:        Intake/Output Summary (Last 24 hours) at 08/02/2018 0910 Last data filed at 08/02/2018 2836 Gross per 24 hour  Intake 1498.95 ml  Output 1700 ml  Net -201.05 ml   Filed Weights   08/01/18 0200  Weight: 108.9 kg    Examination:  General: No acute distress. Cardiovascular: Heart sounds show Airiel Oblinger regular rate, and rhythm.  Lungs: Coarse breath sounds, no increased WOB Abdomen: Soft, nontender, nondistended  Neurological: Alert and oriented 3. Moves all extremities 4 . Cranial nerves II through XII grossly intact. Skin: Warm and dry. No rashes or lesions. Extremities: No clubbing or cyanosis. No edema Psychiatric: Mood and affect are normal. Insight and judgment are appropriate.    Data Reviewed: I have personally reviewed following labs and imaging studies  CBC: Recent Labs  Lab 08/01/18 0215 08/02/18 0401  WBC 14.7* 18.4*  HGB 15.9* 12.8  HCT 49.8* 40.1  MCV 90.7 88.9  PLT 207 629   Basic Metabolic Panel: Recent Labs  Lab 08/01/18 0215 08/02/18 0401  NA 144 138  K 3.9 3.4*  CL 102 104  CO2 29 26  GLUCOSE 96 111*  BUN 8 7  CREATININE 0.88 0.97  CALCIUM 9.3 8.5*  MG  --  1.9   GFR: Estimated Creatinine Clearance: 75.6 mL/min (by C-G formula based on SCr of 0.97 mg/dL). Liver Function Tests: Recent Labs  Lab 08/01/18 0215  AST 20  ALT 13  ALKPHOS 109  BILITOT 0.5  PROT 7.6  ALBUMIN 3.6   No results for input(s): LIPASE, AMYLASE in the last 168 hours. No results for input(s): AMMONIA in the last 168 hours. Coagulation Profile: No results for input(s): INR, PROTIME in the last 168 hours. Cardiac Enzymes: No results for input(s): CKTOTAL, CKMB, CKMBINDEX, TROPONINI in the last 168 hours. BNP (last 3 results) No results for input(s): PROBNP in the last 8760 hours. HbA1C: No results for  input(s): HGBA1C in the last 72 hours. CBG: No results for input(s): GLUCAP in the last 168 hours. Lipid Profile: No results for input(s): CHOL, HDL, LDLCALC, TRIG, CHOLHDL, LDLDIRECT in the last 72 hours. Thyroid Function Tests: No results for input(s): TSH, T4TOTAL, FREET4, T3FREE, THYROIDAB in the last 72 hours. Anemia Panel: No results for input(s): VITAMINB12, FOLATE, FERRITIN, TIBC, IRON, RETICCTPCT in the last 72 hours. Sepsis Labs: Recent Labs  Lab 08/01/18 0225 08/01/18 0547 08/01/18 4765  LATICACIDVEN 3.00* 1.6 1.2    Recent Results (from the past 240 hour(s))  Culture, sputum-assessment     Status: None (Preliminary result)   Collection Time: 08/02/18  6:02 AM  Result Value Ref Range Status   Specimen Description EXPECTORATED SPUTUM  Final   Special Requests NONE  Final   Sputum evaluation   Final    THIS SPECIMEN IS ACCEPTABLE FOR SPUTUM CULTURE Performed at [facility], [address], [place], [place] [address]    Report Status PENDING  Incomplete  Culture, respiratory     Status: None (Preliminary result)   Collection Time: 08/02/18  6:02 AM  Result Value Ref Range Status   Specimen Description EXPECTORATED SPUTUM  Final   Special Requests NONE Reflexed from [id number]  Final   Gram Stain   Final    ABUNDANT WBC PRESENT,BOTH PMN AND MONONUCLEAR FEW GRAM POSITIVE COCCI FEW GRAM VARIABLE ROD RARE YEAST Performed at [facility], [place] [address], [place], [place] [address]    Culture PENDING  Incomplete   Report Status PENDING  Incomplete         Radiology Studies: Dg Chest 2 View  Result Date: 08/01/2018 CLINICAL DATA:  Productive cough with chills since Friday. EXAM: CHEST - 2 VIEW COMPARISON:  11/11/2015 FINDINGS: Heart size and pulmonary vascularity are normal. Suggestion of focal infiltration in the left lung base behind the heart may represent early pneumonia. Right lung is clear. No blunting of costophrenic angles. No pneumothorax.  Mediastinal contours appear intact. IMPRESSION: Suggestion of focal infiltration in the left lung base behind the heart, possible pneumonia. Electronically Signed   By: Lucienne Capers M.D.   On: 08/01/2018 02:49        Scheduled Meds: . cloNIDine  0.1 mg Oral QHS  . FLUoxetine  60 mg Oral Daily  . gabapentin  600 mg Oral TID  . hydrochlorothiazide  25 mg Oral Daily  . losartan  100 mg Oral Daily  . pantoprazole  40 mg Oral Daily  . potassium chloride  40 mEq Oral Once   Continuous Infusions: . azithromycin 500 mg (08/01/18 1146)  . cefTRIAXone (ROCEPHIN)  IV 1 g (08/01/18 1031)     LOS: 0 days    Time spent: over 30 min    Fayrene Helper, MD [facility] [phone]  If 7PM-7AM, please contact night-coverage [email or web address] Password [email or web address] 08/02/2018, 9:10 AM

## 2018-08-02 NOTE — Progress Notes (Signed)
Sputum collector cup was given to the patient in the beginning of the shift. Sputum amount is minimal. Unable to send it to the lab. RN will continue to monitor.

## 2018-08-02 NOTE — Evaluation (Signed)
Physical Therapy Evaluation Patient Details Name: Megan Dixon MRN: [id number] DOB: 02/09/1966 Today's Date: 08/02/2018   History of Present Illness  Patient is a 52 y/o female presenting to the ED on 08/01/18 with primary complaints of cough, fever, and SOB. Chest x-ray reveals a retrocardiac infiltrate concerning for pneumonia. Admitted for sepsis secondary to PNA work-up. PMH significant for hypertension, fibromyalgia, and multiple sclerosis.    Clinical Impression  Megan Dixon is a very pleasant 52 y/o female admitted with the above listed diagnosis. Patient reports independence with all mobility prior to admission. Patient today with general min guard assist for mobility for safety. O2 on RA at rest ~96%, dropping to 92% with activity, but recovering with rest. Patient limited in gait/mobility due to SOB/fatigue. Will follow acutely to maximize safe and independent mobility prior to d/c.      Follow Up Recommendations No PT follow up    Equipment Recommendations  None recommended by PT    Recommendations for Other Services       Precautions / Restrictions Precautions Precautions: Fall Restrictions Weight Bearing Restrictions: No      Mobility  Bed Mobility Overal bed mobility: Modified Independent                Transfers Overall transfer level: Needs assistance Equipment used: None Transfers: Sit to/from Stand Sit to Stand: Min guard;Supervision         General transfer comment: for safety/immediate standing balance  Ambulation/Gait Ambulation/Gait assistance: Min guard Gait Distance (Feet): 120 Feet Assistive device: IV Pole Gait Pattern/deviations: Step-through pattern;Decreased stride length;Drifts right/left Gait velocity: decreased   General Gait Details: fatigue/SOB limiting; no LOB or overt instability  Stairs            Wheelchair Mobility    Modified Rankin (Stroke Patients Only)       Balance Overall balance assessment: Mild  deficits observed, not formally tested                                           Pertinent Vitals/Pain Pain Assessment: Faces Faces Pain Scale: Hurts little more Pain Location: chest - from coughing Pain Descriptors / Indicators: Aching;Sore Pain Intervention(s): Limited activity within patient's tolerance;Monitored during session    Independence expects to be discharged to:: Private residence Living Arrangements: Alone Available Help at Discharge: Family;Available PRN/intermittently Type of Home: House Home Access: (1 step)     Home Layout: One level Home Equipment: [place] - 2 wheels;Cane - single point      Prior Function Level of Independence: Independent               Hand Dominance        Extremity/Trunk Assessment        Lower Extremity Assessment Lower Extremity Assessment: Generalized weakness    Cervical / Trunk Assessment Cervical / Trunk Assessment: Normal  Communication   Communication: No difficulties  Cognition Arousal/Alertness: Awake/alert Behavior During Therapy: WFL for tasks assessed/performed Overall Cognitive Status: Within Functional Limits for tasks assessed                                        General Comments      Exercises     Assessment/Plan    PT Assessment Patient needs continued PT services  PT  Problem List Decreased strength;Decreased activity tolerance;Decreased balance;Decreased mobility;Decreased safety awareness       PT Treatment Interventions Stair training;Gait training;Functional mobility training;Therapeutic activities;Therapeutic exercise;Patient/family education    PT Goals (Current goals can be found in the Care Plan section)  Acute Rehab PT Goals Patient Stated Goal: regain strength PT Goal Formulation: With patient Time For Goal Achievement: 08/09/18 Potential to Achieve Goals: Good    Frequency Min 3X/week   Barriers to discharge         Co-evaluation               AM-PAC PT "6 Clicks" Daily Activity  Outcome Measure Difficulty turning over in bed (including adjusting bedclothes, sheets and blankets)?: A Little Difficulty moving from lying on back to sitting on the side of the bed? : A Little Difficulty sitting down on and standing up from a chair with arms (e.g., wheelchair, bedside commode, etc,.)?: Unable Help needed moving to and from a bed to chair (including a wheelchair)?: A Little Help needed walking in hospital room?: A Little Help needed climbing 3-5 steps with a railing? : A Little 6 Click Score: 16    End of Session Equipment Utilized During Treatment: Gait belt Activity Tolerance: Patient tolerated treatment well;Patient limited by fatigue Patient left: in bed;with call bell/phone within reach;with nursing/sitter in room Nurse Communication: Mobility status PT Visit Diagnosis: Unsteadiness on feet (R26.81);Other abnormalities of gait and mobility (R26.89);Muscle weakness (generalized) (M62.81)    Time: 5361-4431 PT Time Calculation (min) (ACUTE ONLY): 29 min   Charges:   PT Evaluation $PT Eval Moderate Complexity: 1 Mod         Megan Dixon, PT, DPT 08/02/18 2:07 PM Pager: [phone]

## 2018-08-02 NOTE — Plan of Care (Signed)

## 2018-08-03 ENCOUNTER — Encounter (HOSPITAL_COMMUNITY): Payer: Self-pay | Admitting: Radiology

## 2018-08-03 ENCOUNTER — Inpatient Hospital Stay (HOSPITAL_COMMUNITY): Payer: Medicare Other

## 2018-08-03 DIAGNOSIS — A419 Sepsis, unspecified organism: Principal | ICD-10-CM

## 2018-08-03 DIAGNOSIS — J189 Pneumonia, unspecified organism: Secondary | ICD-10-CM

## 2018-08-03 LAB — CBC
HCT: 40.9 % (ref 36.0–46.0)
Hemoglobin: 12.9 g/dL (ref 12.0–15.0)
MCH: 28.3 pg (ref 26.0–34.0)
MCHC: 31.5 g/dL (ref 30.0–36.0)
MCV: 89.7 fL (ref 78.0–100.0)
Platelets: 194 10*3/uL (ref 150–400)
RBC: 4.56 MIL/uL (ref 3.87–5.11)
RDW: 14.4 % (ref 11.5–15.5)
WBC: 10.6 10*3/uL — ABNORMAL HIGH (ref 4.0–10.5)

## 2018-08-03 LAB — BASIC METABOLIC PANEL
Anion gap: 5 (ref 5–15)
BUN: 10 mg/dL (ref 6–20)
CO2: 29 mmol/L (ref 22–32)
Calcium: 8.4 mg/dL — ABNORMAL LOW (ref 8.9–10.3)
Chloride: 106 mmol/L (ref 98–111)
Creatinine, Ser: 0.77 mg/dL (ref 0.44–1.00)
GFR calc Af Amer: >60 mL/min (ref >60)
GFR calc non Af Amer: >60 mL/min (ref >60)
Glucose, Bld: 96 mg/dL (ref 70–99)
Potassium: 3.6 mmol/L (ref 3.5–5.1)
Sodium: 140 mmol/L (ref 135–145)

## 2018-08-03 LAB — EXPECTORATED SPUTUM ASSESSMENT W GRAM STAIN, RFLX TO RESP C

## 2018-08-03 LAB — EXPECTORATED SPUTUM ASSESSMENT W REFEX TO RESP CULTURE

## 2018-08-03 LAB — MAGNESIUM: Magnesium: 2 mg/dL (ref 1.7–2.4)

## 2018-08-03 MED ORDER — IOHEXOL 300 MG/ML  SOLN
75.0000 mL | Freq: Once | INTRAMUSCULAR | Status: AC | PRN
  Administered 2018-08-03: 75 mL via INTRAVENOUS

## 2018-08-03 NOTE — Plan of Care (Signed)
  Problem: Education: Goal: Knowledge of General Education information will improve Description Including pain rating scale, medication(s)/side effects and non-pharmacologic comfort measures Outcome: Progressing   Problem: Health Behavior/Discharge Planning: Goal: Ability to manage health-related needs will improve Outcome: Progressing   Problem: Clinical Measurements: Goal: Ability to maintain clinical measurements within normal limits will improve Outcome: Progressing Goal: Will remain free from infection Outcome: Progressing Goal: Diagnostic test results will improve Outcome: Progressing Goal: Respiratory complications will improve Outcome: Progressing Goal: Cardiovascular complication will be avoided Outcome: Progressing   Problem: Activity: Goal: Risk for activity intolerance will decrease Outcome: Progressing   Problem: Nutrition: Goal: Adequate nutrition will be maintained Outcome: Progressing   Problem: Elimination: Goal: Will not experience complications related to bowel motility Outcome: Progressing Goal: Will not experience complications related to urinary retention Outcome: Progressing   Problem: Pain Managment: Goal: General experience of comfort will improve Outcome: Progressing   Problem: Safety: Goal: Ability to remain free from injury will improve Outcome: Progressing   

## 2018-08-03 NOTE — Progress Notes (Signed)
PROGRESS NOTE    Megan Dixon  TDD:[id number] DOB: [birth year] DOA: 08/01/2018 PCP: System, Provider Not In    Brief Narrative:  52 year old with past medical history relevant for relapsing remitting multiple sclerosis that is quiescent my class III obesity, incarcerated ventral hernia status post surgical repair, hypertension, fibromyalgia, depression/anxiety, chronic pain who comes in with sepsis from pneumonia hemoptysis.  Assessment & Plan:   Principal Problem:   Sepsis due to pneumonia [facility]) Active Problems:   MS (multiple sclerosis) ([place])   Hypertension   Fibromyalgia   Blood-tinged sputum   #) Sepsis secondary to pneumonia with hemoptysis: Patient is improving.  She has been afebrile now.  Her hemoptysis is concerning is she continues to have persistent hemoptysis of unclear etiology.  She reports a history of prior lung disease where she was told she had "lung cancer" thinner not being the case.  She does report that approximately 1 week ago she had a case of severe epistaxis that she treated by lying down flat and letting the blood drain into the back of her throat. -Continue IV ceftriaxone and azithromycin started 08/01/2018 -Blood cultures from 08/01/2017 no growth today -CT chest without contrast pending  #) Hypertension: -Continue clonidine 0.1 mg nightly -Continue HCTZ 25 mg daily - Continue amlodipine 5 mg daily - The losartan 100 mg daily  #) Pain/psych: -Continue gabapentin 600 mg 3 times daily - Continue fluoxetine 60 mg daily  Fluids: Tolerating p.o. Electrolytes: Monitor and supplement Nutrition: Regular diet  Prophylaxis: SCDs  Disposition: Pending resolution of hemoptysis or further evaluation  Full code   Consultants:   None  Procedures:   None  Antimicrobials:  IV ceftriaxone azithromycin started 08/01/2018   Subjective: Patient reports that she is feeling better but she continues to have hemoptysis that is small volume.  She denies  any chest pain, nausea, vomiting, diarrhea.  She reports she can feel the hemoptysis coming up from her left lung.  She does have a fairly severe chronic cough.  Objective: Vitals:   08/02/18 0416 08/02/18 1330 08/02/18 2036 08/03/18 0438  BP: (!) 148/96 (!) 167/107 (!) 156/99 (!) 154/99  Pulse: 99 (!) 101 93 86  Resp: 16 20  16   Temp: 98.5 F (36.9 C) 98.2 F (36.8 C) 98.4 F (36.9 C) (!) 97.3 F (36.3 C)  TempSrc: Oral Oral Oral Oral  SpO2: 97% 98% 90% 95%  Weight:      Height:        Intake/Output Summary (Last 24 hours) at 08/03/2018 1243 Last data filed at 08/03/2018 0900 Gross per 24 hour  Intake 3911.18 ml  Output -  Net 3911.18 ml   Filed Weights   08/01/18 0200  Weight: 108.9 kg    Examination:  General exam: Appears calm and comfortable  Respiratory system: No increased work of breathing, diffuse rhonchi over left, diminished lung sounds at bases, no wheezes. Cardiovascular system: Distant heart sounds, regular rate and rhythm, no murmurs Gastrointestinal system: Abdomen is nondistended, soft and nontender. No organomegaly or masses felt. Normal bowel sounds heard. Central nervous system: Alert and oriented. No focal neurological deficits. Extremities: 1+ lower extremity edema Skin: No rashes visible skin Psychiatry: Judgement and insight appear normal. Mood & affect appropriate.     Data Reviewed: I have personally reviewed following labs and imaging studies  CBC: Recent Labs  Lab 08/01/18 0215 08/02/18 0401 08/03/18 0412  WBC 14.7* 18.4* 10.6*  HGB 15.9* 12.8 12.9  HCT 49.8* 40.1 40.9  MCV 90.7 88.9 89.7  PLT 207 195 779   Basic Metabolic Panel: Recent Labs  Lab 08/01/18 0215 08/02/18 0401 08/03/18 0412  NA 144 138 140  K 3.9 3.4* 3.6  CL 102 104 106  CO2 29 26 29   GLUCOSE 96 111* 96  BUN 8 7 10   CREATININE 0.88 0.97 0.77  CALCIUM 9.3 8.5* 8.4*  MG  --  1.9 2.0   GFR: Estimated Creatinine Clearance: 91.6 mL/min (by C-G formula based  on SCr of 0.77 mg/dL). Liver Function Tests: Recent Labs  Lab 08/01/18 0215  AST 20  ALT 13  ALKPHOS 109  BILITOT 0.5  PROT 7.6  ALBUMIN 3.6   No results for input(s): LIPASE, AMYLASE in the last 168 hours. No results for input(s): AMMONIA in the last 168 hours. Coagulation Profile: No results for input(s): INR, PROTIME in the last 168 hours. Cardiac Enzymes: No results for input(s): CKTOTAL, CKMB, CKMBINDEX, TROPONINI in the last 168 hours. BNP (last 3 results) No results for input(s): PROBNP in the last 8760 hours. HbA1C: No results for input(s): HGBA1C in the last 72 hours. CBG: No results for input(s): GLUCAP in the last 168 hours. Lipid Profile: No results for input(s): CHOL, HDL, LDLCALC, TRIG, CHOLHDL, LDLDIRECT in the last 72 hours. Thyroid Function Tests: No results for input(s): TSH, T4TOTAL, FREET4, T3FREE, THYROIDAB in the last 72 hours. Anemia Panel: No results for input(s): VITAMINB12, FOLATE, FERRITIN, TIBC, IRON, RETICCTPCT in the last 72 hours. Sepsis Labs: Recent Labs  Lab 08/01/18 0225 08/01/18 0547 08/01/18 3903  LATICACIDVEN 3.00* 1.6 1.2    Recent Results (from the past 240 hour(s))  Blood Culture (routine x 2)     Status: None (Preliminary result)   Collection Time: 08/01/18  3:00 AM  Result Value Ref Range Status   Specimen Description BLOOD RIGHT FOREARM  Final   Special Requests   Final    BOTTLES DRAWN AEROBIC AND ANAEROBIC Blood Culture results may not be optimal due to an excessive volume of blood received in culture bottles   Culture   Final    NO GROWTH 1 DAY Performed at [facility], [place] [address] [place], [place] [address]    Report Status PENDING  Incomplete  Blood Culture (routine x 2)     Status: None (Preliminary result)   Collection Time: 08/01/18  3:34 AM  Result Value Ref Range Status   Specimen Description BLOOD RIGHT HAND  Final   Special Requests   Final    BOTTLES DRAWN AEROBIC ONLY Blood Culture adequate  volume   Culture   Final    NO GROWTH 1 DAY Performed at [facility], [place] [address] [place], [place] [address]    Report Status PENDING  Incomplete  Culture, sputum-assessment     Status: None (Preliminary result)   Collection Time: 08/02/18  6:02 AM  Result Value Ref Range Status   Specimen Description EXPECTORATED SPUTUM  Final   Special Requests NONE  Final   Sputum evaluation   Final    THIS SPECIMEN IS ACCEPTABLE FOR SPUTUM CULTURE Performed at [facility], [place] [address], [place],  [address]    Report Status PENDING  Incomplete  Culture, respiratory     Status: None (Preliminary result)   Collection Time: 08/02/18  6:02 AM  Result Value Ref Range Status   Specimen Description EXPECTORATED SPUTUM  Final   Special Requests NONE Reflexed from [id number]  Final   Gram Stain   Final  ABUNDANT WBC PRESENT,BOTH PMN AND MONONUCLEAR FEW GRAM POSITIVE COCCI FEW GRAM VARIABLE ROD RARE YEAST    Culture   Final    CULTURE REINCUBATED FOR BETTER GROWTH Performed at [facility], [place] [address] [place], [place] [address]    Report Status PENDING  Incomplete         Radiology Studies: Dg Chest 2 View  Result Date: 08/02/2018 CLINICAL DATA:  Hemoptysis, former smoker. History of multiple sclerosis. EXAM: CHEST - 2 VIEW COMPARISON:  PA and lateral chest x-ray of August 01, 2018 and November 11, 2015 FINDINGS: There is patchy increased density adjacent to the left heart border which is new since the 2016 study but similar to that seen on yesterday's study. A faint air bronchogram is seen on the lateral view. A tiny left pleural effusion layers posteriorly. The interstitial markings of both lungs are coarse though stable. The heart and pulmonary vascularity are normal. There is calcification in the wall of the aortic arch. The bony thorax exhibits no acute abnormality. IMPRESSION: Anterior left lower lobe pneumonia. Probable trace left pleural effusion. Mild  chronic bronchitic changes, stable. Thoracic aortic atherosclerosis. Electronically Signed   By: David  Martinique M.D.   On: 08/02/2018 11:21   Ct Chest W Contrast  Result Date: 08/03/2018 CLINICAL DATA:  Productive cough with chills, shortness of breath and hemoptysis. EXAM: CT CHEST WITH CONTRAST TECHNIQUE: Multidetector CT imaging of the chest was performed during intravenous contrast administration. CONTRAST:  76mL OMNIPAQUE IOHEXOL 300 MG/ML  SOLN COMPARISON:  Radiographs 08/02/2018 and 08/01/2018. CTA neck 02/11/2018. Abdominal CT 08/22/2017. FINDINGS: Cardiovascular: Mild atherosclerosis of the aorta, great vessels and coronary arteries. No acute vascular findings. The heart size is normal. There is no pericardial effusion. Mediastinum/Nodes: There are several prominent to mildly enlarged mediastinal lymph nodes. High right paratracheal node measuring 13 mm on image 33/3 appears new. 13 mm subcarinal node on image 73/3, not previously imaged. No evidence of hilar or axillary lymphadenopathy. The thyroid gland, trachea and esophagus demonstrate no significant findings. Lungs/Pleura: There is no pleural effusion. There is mild underlying centrilobular emphysema. Multifocal nodular airspace opacities are present in both lower lobes, right greater than left. There is also a component within the right middle lobe, measuring 16 mm on image 96/4. There is minimal lingular involvement. There is mild ground-glass opacity surrounding these airspace opacities. No endobronchial lesion or dominant lung mass. Upper abdomen: The visualized upper abdomen appear stable without suspicious findings. The left adrenal gland appears unchanged. Musculoskeletal/Chest wall: There is no chest wall mass or suspicious osseous finding. IMPRESSION: 1. Lower lobe predominant bilateral airspace opacities consistent with multilobar pneumonia. Followup PA and lateral chest X-ray is recommended in 3-4 weeks following trial of antibiotic  therapy to ensure resolution and exclude underlying malignancy. 2. Prominent right paratracheal and subcarinal lymph nodes, likely reactive. 3. No significant pleural effusion. 4. Mild Aortic Atherosclerosis (ICD10-I70.0). Electronically Signed   By: Richardean Sale M.D.   On: 08/03/2018 11:36        Scheduled Meds: . amLODipine  5 mg Oral Daily  . cloNIDine  0.1 mg Oral QHS  . FLUoxetine  60 mg Oral Daily  . gabapentin  600 mg Oral TID  . hydrochlorothiazide  25 mg Oral Daily  . losartan  100 mg Oral Daily  . pantoprazole  40 mg Oral Daily   Continuous Infusions: . azithromycin Stopped (08/02/18 1434)  . cefTRIAXone (ROCEPHIN)  IV 1 g (08/03/18 1002)     LOS: 1  day    Time spent: [place], MD [facility]  If 7PM-7AM, please contact night-coverage [email or web address] Password [email or web address] 08/03/2018, 12:43 PM

## 2018-08-03 NOTE — Progress Notes (Signed)
Physical Therapy Treatment Patient Details Name: Megan Dixon MRN: [id number] DOB: [birth year] Today's Date: 08/03/2018    History of Present Illness Patient is a 52 y/o female presenting to the ED on 08/01/18 with primary complaints of cough, fever, and SOB. Chest x-ray reveals a retrocardiac infiltrate concerning for pneumonia. Admitted for sepsis secondary to PNA work-up. PMH significant for hypertension, fibromyalgia, and multiple sclerosis.    PT Comments    Pt making steady progress with functional mobility. Tolerated ambulating in hallway without any UE supports this session. PT will continue to follow to progress mobility as tolerated.   Follow Up Recommendations  No PT follow up     Equipment Recommendations  None recommended by PT    Recommendations for Other Services       Precautions / Restrictions Precautions Precautions: Fall Restrictions Weight Bearing Restrictions: No    Mobility  Bed Mobility Overal bed mobility: Modified Independent                Transfers Overall transfer level: Needs assistance Equipment used: None Transfers: Sit to/from Stand Sit to Stand: Supervision         General transfer comment: for safety  Ambulation/Gait Ambulation/Gait assistance: Min guard;Supervision Gait Distance (Feet): 400 Feet Assistive device: None Gait Pattern/deviations: Step-through pattern;Decreased stride length;Drifts right/left Gait velocity: decreased Gait velocity interpretation: 1.31 - 2.62 ft/sec, indicative of limited community ambulator General Gait Details: pt with mild unsteadiness, drifting R/L but no overt LOB or need for physical assistance   Stairs             Wheelchair Mobility    Modified Rankin (Stroke Patients Only)       Balance Overall balance assessment: Mild deficits observed, not formally tested                                          Cognition Arousal/Alertness: Awake/alert Behavior  During Therapy: WFL for tasks assessed/performed Overall Cognitive Status: Within Functional Limits for tasks assessed                                        Exercises      General Comments        Pertinent Vitals/Pain Pain Assessment: Faces Faces Pain Scale: Hurts a little bit Pain Location: chest - from coughing Pain Descriptors / Indicators: Aching;Sore Pain Intervention(s): Monitored during session;Repositioned    Home Living                      Prior Function            PT Goals (current goals can now be found in the care plan section) Acute Rehab PT Goals PT Goal Formulation: With patient Time For Goal Achievement: 08/09/18 Potential to Achieve Goals: Good Progress towards PT goals: Progressing toward goals    Frequency    Min 3X/week      PT Plan Current plan remains appropriate    Co-evaluation              AM-PAC PT "6 Clicks" Daily Activity  Outcome Measure  Difficulty turning over in bed (including adjusting bedclothes, sheets and blankets)?: None Difficulty moving from lying on back to sitting on the side of the bed? : None Difficulty sitting down on and standing  up from a chair with arms (e.g., wheelchair, bedside commode, etc,.)?: A Little Help needed moving to and from a bed to chair (including a wheelchair)?: None Help needed walking in hospital room?: None Help needed climbing 3-5 steps with a railing? : A Little 6 Click Score: 22    End of Session   Activity Tolerance: Patient tolerated treatment well Patient left: with call bell/phone within reach;with nursing/sitter in room;Other (comment)(in bathroom with RN, transport present as well) Nurse Communication: Mobility status PT Visit Diagnosis: Other abnormalities of gait and mobility (R26.89);Muscle weakness (generalized) (M62.81)     Time: 9234-1443 PT Time Calculation (min) (ACUTE ONLY): 13 min  Charges:  $Gait Training: 8-22 mins                      [place], [place], [place] 08/03/2018, 11:35 AM

## 2018-08-04 LAB — CULTURE, RESPIRATORY W GRAM STAIN: Culture: NORMAL

## 2018-08-04 LAB — CULTURE, RESPIRATORY

## 2018-08-04 MED ORDER — AZITHROMYCIN 250 MG PO TABS: 250.0000 mg | ORAL_TABLET | Freq: Every day | ORAL | 0 refills | Status: AC

## 2018-08-04 MED ORDER — CEFDINIR 300 MG PO CAPS: 300.0000 mg | ORAL_CAPSULE | Freq: Two times a day (BID) | ORAL | 0 refills | Status: AC

## 2018-08-04 NOTE — Discharge Summary (Signed)
Physician Discharge Summary  Megan Dixon BDZ:[id number] DOB: 10/03/1966 DOA: 08/01/2018  PCP: System, Provider Not In  Admit date: 08/01/2018 Discharge date: 08/04/2018  Admitted From: Home Disposition: Home  Recommendations for Outpatient Follow-up:  1. Follow up with PCP in 1-2 weeks 2. Please obtain BMP/CBC in one week 3. Please repeat a chest x-ray in 3 to 4 weeks  Home Health: None Equipment/Devices: None  Discharge Condition: Stable CODE STATUS: Full Diet recommendation: Heart Healthy  Brief/Interim Summary:  #) Hemoptysis with sepsis due to community acquired pneumonia: Patient presented with fever, hemoptysis.  Chest x-ray showed community acquired pneumonia.  Patient was given IV ceftriaxone and azithromycin.  She continued to have small volume hemoptysis and a CT chest with contrast was done that was negative for pulmonary embolism and showed only pneumonia.  Patient should have a repeat chest x-ray in approximately 3 to 4 weeks after resolution of her pneumonia.  She was discharged home on oral Ceftin ear and azithromycin.  #) Pretension: Patient was continued on home HCTZ, losartan, amlodipine, clonidine.  #) Pain/psych: Patient was continued on home gabapentin and fluoxetine.  #) Relapsing remitting multiple sclerosis: This was quiescent at this time.  Discharge Diagnoses:  Principal Problem:   Sepsis due to pneumonia [facility]) Active Problems:   MS (multiple sclerosis) ([place])   Hypertension   Fibromyalgia   Blood-tinged sputum    Discharge Instructions  Discharge Instructions    Call MD for:  difficulty breathing, headache or visual disturbances   Complete by:  As directed    Call MD for:  hives   Complete by:  As directed    Call MD for:  persistant dizziness or light-headedness   Complete by:  As directed    Call MD for:  persistant nausea and vomiting   Complete by:  As directed    Call MD for:  redness, tenderness, or signs of infection (pain,  swelling, redness, odor or green/yellow discharge around incision site)   Complete by:  As directed    Call MD for:  severe uncontrolled pain   Complete by:  As directed    Call MD for:  temperature >100.4   Complete by:  As directed    Diet - low sodium heart healthy   Complete by:  As directed    Discharge instructions   Complete by:  As directed    Please follow-up with your primary care doctor in 1 to 2 weeks.  Please repeat a chest x-ray in 3 to 4 weeks.   Increase activity slowly   Complete by:  As directed      Allergies as of 08/04/2018      Reactions   Morphine And Related Other (See Comments)   Headache   Other Other (See Comments)   CANNOT EAT ANYTHING WITH SEEDS (DIVERTICULITIS)   Peanut-containing Drug Products Other (See Comments)   Has Diverticulitis    Toradol [ketorolac Tromethamine] Anxiety      Medication List    TAKE these medications   acetaminophen 325 MG tablet Commonly known as:  TYLENOL Take 650 mg by mouth every 6 (six) hours as needed for moderate pain or headache.   azithromycin 250 MG tablet Commonly known as:  ZITHROMAX Take 1 tablet (250 mg total) by mouth daily for 3 days.   cefdinir 300 MG capsule Commonly known as:  OMNICEF Take 1 capsule (300 mg total) by mouth 2 (two) times daily for 5 days.   cloNIDine 0.1 MG tablet Commonly known as:  CATAPRES Take 0.1 mg by mouth at bedtime.   cyclobenzaprine 10 MG tablet Commonly known as:  FLEXERIL Take 10 mg by mouth 3 (three) times daily as needed for muscle spasms.   FLUoxetine 20 MG capsule Commonly known as:  PROZAC Take 60 mg by mouth daily.   gabapentin 600 MG tablet Commonly known as:  NEURONTIN Take 600 mg by mouth 3 (three) times daily.   HYDROcodone-acetaminophen 5-325 MG tablet Commonly known as:  NORCO/VICODIN Take 1 tablet by mouth every 6 (six) hours as needed for moderate pain.   losartan-hydrochlorothiazide 100-25 MG tablet Commonly known as:  HYZAAR Take 1 tablet  by mouth daily.   meclizine 25 MG tablet Commonly known as:  ANTIVERT Take 1 tablet (25 mg total) by mouth 3 (three) times daily as needed for dizziness.   naproxen sodium 220 MG tablet Commonly known as:  ALEVE Take 220-440 mg by mouth 2 (two) times daily as needed (for pain).   ondansetron 4 MG disintegrating tablet Commonly known as:  ZOFRAN-ODT Take 1 tablet (4 mg total) by mouth every 8 (eight) hours as needed for nausea or vomiting.   pantoprazole 40 MG tablet Commonly known as:  PROTONIX Take 40 mg by mouth daily.   polyethylene glycol packet Commonly known as:  MIRALAX / GLYCOLAX Mix 8 cap fulls in 32 ounces of juice/water/gatorade and drink over the course of a day. Once your stool is loose or liquid, stop taking. What changed:    how much to take  how to take this  when to take this  reasons to take this  additional instructions       Allergies  Allergen Reactions  . Morphine And Related Other (See Comments)    Headache  . Other Other (See Comments)    CANNOT EAT ANYTHING WITH SEEDS (DIVERTICULITIS)  . Peanut-Containing Drug Products Other (See Comments)    Has Diverticulitis   . Toradol [Ketorolac Tromethamine] Anxiety    Consultations: None  Procedures/Studies: Dg Chest 2 View  Result Date: 08/02/2018 CLINICAL DATA:  Hemoptysis, former smoker. History of multiple sclerosis. EXAM: CHEST - 2 VIEW COMPARISON:  PA and lateral chest x-ray of August 01, 2018 and November 11, 2015 FINDINGS: There is patchy increased density adjacent to the left heart border which is new since the 2016 study but similar to that seen on yesterday's study. A faint air bronchogram is seen on the lateral view. A tiny left pleural effusion layers posteriorly. The interstitial markings of both lungs are coarse though stable. The heart and pulmonary vascularity are normal. There is calcification in the wall of the aortic arch. The bony thorax exhibits no acute abnormality.  IMPRESSION: Anterior left lower lobe pneumonia. Probable trace left pleural effusion. Mild chronic bronchitic changes, stable. Thoracic aortic atherosclerosis. Electronically Signed   By: David  Martinique M.D.   On: 08/02/2018 11:21   Dg Chest 2 View  Result Date: 08/01/2018 CLINICAL DATA:  Productive cough with chills since Friday. EXAM: CHEST - 2 VIEW COMPARISON:  11/11/2015 FINDINGS: Heart size and pulmonary vascularity are normal. Suggestion of focal infiltration in the left lung base behind the heart may represent early pneumonia. Right lung is clear. No blunting of costophrenic angles. No pneumothorax. Mediastinal contours appear intact. IMPRESSION: Suggestion of focal infiltration in the left lung base behind the heart, possible pneumonia. Electronically Signed   By: Lucienne Capers M.D.   On: 08/01/2018 02:49   Ct Chest W Contrast  Result Date: 08/03/2018 CLINICAL DATA:  Productive  cough with chills, shortness of breath and hemoptysis. EXAM: CT CHEST WITH CONTRAST TECHNIQUE: Multidetector CT imaging of the chest was performed during intravenous contrast administration. CONTRAST:  57mL OMNIPAQUE IOHEXOL 300 MG/ML  SOLN COMPARISON:  Radiographs 08/02/2018 and 08/01/2018. CTA neck 02/11/2018. Abdominal CT 08/22/2017. FINDINGS: Cardiovascular: Mild atherosclerosis of the aorta, great vessels and coronary arteries. No acute vascular findings. The heart size is normal. There is no pericardial effusion. Mediastinum/Nodes: There are several prominent to mildly enlarged mediastinal lymph nodes. High right paratracheal node measuring 13 mm on image 33/3 appears new. 13 mm subcarinal node on image 73/3, not previously imaged. No evidence of hilar or axillary lymphadenopathy. The thyroid gland, trachea and esophagus demonstrate no significant findings. Lungs/Pleura: There is no pleural effusion. There is mild underlying centrilobular emphysema. Multifocal nodular airspace opacities are present in both lower lobes,  right greater than left. There is also a component within the right middle lobe, measuring 16 mm on image 96/4. There is minimal lingular involvement. There is mild ground-glass opacity surrounding these airspace opacities. No endobronchial lesion or dominant lung mass. Upper abdomen: The visualized upper abdomen appear stable without suspicious findings. The left adrenal gland appears unchanged. Musculoskeletal/Chest wall: There is no chest wall mass or suspicious osseous finding. IMPRESSION: 1. Lower lobe predominant bilateral airspace opacities consistent with multilobar pneumonia. Followup PA and lateral chest X-ray is recommended in 3-4 weeks following trial of antibiotic therapy to ensure resolution and exclude underlying malignancy. 2. Prominent right paratracheal and subcarinal lymph nodes, likely reactive. 3. No significant pleural effusion. 4. Mild Aortic Atherosclerosis (ICD10-I70.0). Electronically Signed   By: Richardean Sale M.D.   On: 08/03/2018 11:36      Subjective:   Discharge Exam: Vitals:   08/04/18 0507 08/04/18 0516  BP: (!) 153/103 (!) 153/100  Pulse: 81 79  Resp: 16   Temp: 97.8 F (36.6 C)   SpO2: 97%    Vitals:   08/03/18 1247 08/03/18 2000 08/04/18 0507 08/04/18 0516  BP: (!) 148/92 137/87 (!) 153/103 (!) 153/100  Pulse: 96 93 81 79  Resp: 14 16 16    Temp: 98.7 F (37.1 C) 98.4 F (36.9 C) 97.8 F (36.6 C)   TempSrc: Oral Oral Oral   SpO2: 97% 99% 97%   Weight:      Height:        General exam: Appears calm and comfortable  Respiratory system: No increased work of breathing, diffuse rhonchi over left, diminished lung sounds at bases, no wheezes. Cardiovascular system: Distant heart sounds, regular rate and rhythm, no murmurs Gastrointestinal system: Abdomen is nondistended, soft and nontender. No organomegaly or masses felt. Normal bowel sounds heard. Central nervous system: Alert and oriented. No focal neurological deficits. Extremities: 1+ lower  extremity edema Skin: No rashes visible skin Psychiatry: Judgement and insight appear normal. Mood & affect appropriate.     The results of significant diagnostics from this hospitalization (including imaging, microbiology, ancillary and laboratory) are listed below for reference.     Microbiology: Recent Results (from the past 240 hour(s))  Blood Culture (routine x 2)     Status: None (Preliminary result)   Collection Time: 08/01/18  3:00 AM  Result Value Ref Range Status   Specimen Description BLOOD RIGHT FOREARM  Final   Special Requests   Final    BOTTLES DRAWN AEROBIC AND ANAEROBIC Blood Culture results may not be optimal due to an excessive volume of blood received in culture bottles   Culture   Final  NO GROWTH 3 DAYS Performed at [facility], [place] [address], [place], [place] [address]    Report Status PENDING  Incomplete  Blood Culture (routine x 2)     Status: None (Preliminary result)   Collection Time: 08/01/18  3:34 AM  Result Value Ref Range Status   Specimen Description BLOOD RIGHT HAND  Final   Special Requests   Final    BOTTLES DRAWN AEROBIC ONLY Blood Culture adequate volume   Culture   Final    NO GROWTH 3 DAYS Performed at  [facility], [place] [address] [place], [place] [address]    Report Status PENDING  Incomplete  Culture, sputum-assessment     Status: None   Collection Time: 08/02/18  6:02 AM  Result Value Ref Range Status   Specimen Description EXPECTORATED SPUTUM  Final   Special Requests NONE  Final   Sputum evaluation   Final    THIS SPECIMEN IS ACCEPTABLE FOR SPUTUM CULTURE Performed at [facility], [place] [address] [place], [place] [address]    Report Status 08/03/2018 FINAL  Final  Culture, respiratory     Status: None   Collection Time: 08/02/18  6:02 AM  Result Value Ref Range Status   Specimen Description EXPECTORATED SPUTUM  Final   Special Requests NONE Reflexed from [id number]  Final   Gram Stain   Final    ABUNDANT  WBC PRESENT,BOTH PMN AND MONONUCLEAR FEW GRAM POSITIVE COCCI FEW GRAM VARIABLE ROD RARE YEAST    Culture   Final    FEW Consistent with normal respiratory flora. Performed at [facility], [place] [address], [place], [place] [address]    Report Status 08/04/2018 FINAL  Final     Labs: BNP (last 3 results) No results for input(s): BNP in the last 8760 hours. Basic Metabolic Panel: Recent Labs  Lab 08/01/18 0215 08/02/18 0401 08/03/18 0412  NA 144 138 140  K 3.9 3.4* 3.6  CL 102 104 106  CO2 29 26 29   GLUCOSE 96 111* 96  BUN 8 7 10   CREATININE 0.88 0.97 0.77  CALCIUM 9.3 8.5* 8.4*  MG  --  1.9 2.0   Liver Function Tests: Recent Labs  Lab 08/01/18 0215  AST 20  ALT 13  ALKPHOS 109  BILITOT 0.5  PROT 7.6  ALBUMIN 3.6   No results for input(s): LIPASE, AMYLASE in the last 168 hours. No results for input(s): AMMONIA in the last 168 hours. CBC: Recent Labs  Lab 08/01/18 0215 08/02/18 0401 08/03/18 0412  WBC 14.7* 18.4* 10.6*  HGB 15.9* 12.8 12.9  HCT 49.8* 40.1 40.9  MCV 90.7 88.9 89.7  PLT 207 195 194   Cardiac Enzymes: No results for input(s): CKTOTAL, CKMB, CKMBINDEX, TROPONINI in the last 168 hours. BNP: Invalid input(s): POCBNP CBG: No results for input(s): GLUCAP in the last 168 hours. D-Dimer No results for input(s): DDIMER in the last 72 hours. Hgb A1c No results for input(s): HGBA1C in the last 72 hours. Lipid Profile No results for input(s): CHOL, HDL, LDLCALC, TRIG, CHOLHDL, LDLDIRECT in the last 72 hours. Thyroid function studies No results for input(s): TSH, T4TOTAL, T3FREE, THYROIDAB in the last 72 hours.  Invalid input(s): FREET3 Anemia work up No results for input(s): VITAMINB12, FOLATE, FERRITIN, TIBC, IRON, RETICCTPCT in the last 72 hours. Urinalysis    Component Value Date/Time   COLORURINE YELLOW 08/09/2017 2043   APPEARANCEUR CLOUDY (A) 08/09/2017 2043   LABSPEC 1.011 08/09/2017 2043  PHURINE 8.0 08/09/2017 2043    GLUCOSEU NEGATIVE 08/09/2017 2043   HGBUR NEGATIVE 08/09/2017 2043   [place] NEGATIVE 08/09/2017 2043   [place] 08/09/2017 2043   PROTEINUR 30 (A) 08/09/2017 2043   NITRITE NEGATIVE 08/09/2017 2043   LEUKOCYTESUR NEGATIVE 08/09/2017 2043   Sepsis Labs Invalid input(s): PROCALCITONIN,  WBC,  LACTICIDVEN Microbiology Recent Results (from the past 240 hour(s))  Blood Culture (routine x 2)     Status: None (Preliminary result)   Collection Time: 08/01/18  3:00 AM  Result Value Ref Range Status   Specimen Description BLOOD RIGHT FOREARM  Final   Special Requests   Final    BOTTLES DRAWN AEROBIC AND ANAEROBIC Blood Culture results may not be optimal due to an excessive volume of blood received in culture bottles   Culture   Final    NO GROWTH 3 DAYS Performed at [facility], [place] [address] [place], [place] [address]    Report Status PENDING  Incomplete  Blood Culture (routine x 2)     Status: None (Preliminary result)   Collection Time: 08/01/18  3:34 AM  Result Value Ref Range Status   Specimen Description BLOOD RIGHT HAND  Final   Special Requests   Final    BOTTLES DRAWN AEROBIC ONLY Blood Culture adequate volume   Culture   Final    NO GROWTH 3 DAYS Performed at [facility], [place] [address], [place], [place] [address]    Report Status PENDING  Incomplete  Culture, sputum-assessment     Status: None   Collection Time: 08/02/18  6:02 AM  Result Value Ref Range Status   Specimen Description EXPECTORATED SPUTUM  Final   Special Requests NONE  Final   Sputum evaluation   Final    THIS SPECIMEN IS ACCEPTABLE FOR SPUTUM CULTURE Performed at [facility], [place] [address] [place], [place] [address]    Report Status 08/03/2018 FINAL  Final  Culture, respiratory     Status: None   Collection Time: 08/02/18  6:02 AM  Result Value Ref Range Status   Specimen Description EXPECTORATED SPUTUM  Final   Special Requests NONE Reflexed from [id number]  Final    Gram Stain   Final    ABUNDANT WBC PRESENT,BOTH PMN AND MONONUCLEAR FEW GRAM POSITIVE COCCI FEW GRAM VARIABLE ROD RARE YEAST    Culture   Final    FEW Consistent with normal respiratory flora. Performed at [facility], [place] [address] [place], [place] [address]    Report Status 08/04/2018 FINAL  Final     Time coordinating discharge: 40  SIGNED:   Cristy Folks, MD  [facility] 08/04/2018, 12:12 PM   If 7PM-7AM, please contact night-coverage [email or web address] Password [email or web address]

## 2018-08-04 NOTE — Progress Notes (Signed)
Patient discharging home today. Discharge instructions explained to patient and she verbalized understanding. Took all personal belonging. No further questions or concerns voiced.

## 2018-08-04 NOTE — Plan of Care (Signed)
  Problem: Education: Goal: Knowledge of General Education information will improve Description Including pain rating scale, medication(s)/side effects and non-pharmacologic comfort measures Outcome: Progressing   Problem: Clinical Measurements: Goal: Ability to maintain clinical measurements within normal limits will improve Outcome: Progressing   Problem: Clinical Measurements: Goal: Will remain free from infection Outcome: Progressing   Problem: Activity: Goal: Risk for activity intolerance will decrease Outcome: Progressing   Problem: Safety: Goal: Ability to remain free from injury will improve Outcome: Progressing   

## 2018-08-04 NOTE — Discharge Instructions (Signed)

## 2018-08-04 NOTE — Progress Notes (Signed)
Physical Therapy Treatment Patient Details Name: Megan Dixon MRN: [id number] DOB: Dec 31, 1965 Today's Date: 08/04/2018    History of Present Illness Patient is a 52 y/o female presenting to the ED on 08/01/18 with primary complaints of cough, fever, and SOB. Chest x-ray reveals a retrocardiac infiltrate concerning for pneumonia. Admitted for sepsis secondary to PNA work-up. PMH significant for hypertension, fibromyalgia, and multiple sclerosis.    PT Comments    Pt progressing well and no longer requires skilled rehab in this setting.  Assessment performed today to address functional mobility.  Pt is performing all tasks with functional independence.  Will inform supervising PT to sign off on pt at this time.      Follow Up Recommendations  No PT follow up     Equipment Recommendations  None recommended by PT    Recommendations for Other Services       Precautions / Restrictions Precautions Precautions: Fall Restrictions Weight Bearing Restrictions: No    Mobility  Bed Mobility Overal bed mobility: Modified Independent                Transfers Overall transfer level: Independent Equipment used: None Transfers: Sit to/from Stand Sit to Stand: Independent         General transfer comment: for safety  Ambulation/Gait Ambulation/Gait assistance: Independent Gait Distance (Feet): 500 Feet Assistive device: None Gait Pattern/deviations: Step-through pattern;WFL(Within Functional Limits) Gait velocity: decreased   General Gait Details: No unsteadiness, good cadence and able to push and manage own IV pole.     Stairs             Wheelchair Mobility    Modified Rankin (Stroke Patients Only)       Balance Overall balance assessment: Mild deficits observed, not formally tested                                          Cognition Arousal/Alertness: Awake/alert Behavior During Therapy: WFL for tasks assessed/performed Overall  Cognitive Status: Within Functional Limits for tasks assessed                                        Exercises      General Comments        Pertinent Vitals/Pain Pain Assessment: No/denies pain    Home Living                      Prior Function            PT Goals (current goals can now be found in the care plan section) Acute Rehab PT Goals Patient Stated Goal: regain strength Potential to Achieve Goals: Good Additional Goals Additional Goal #1: Patient to perform all functional mobility tasks with O2 maintenance at or above 94% on RA. Progress towards PT goals: Progressing toward goals    Frequency    Min 3X/week      PT Plan Current plan remains appropriate    Co-evaluation              AM-PAC PT "6 Clicks" Daily Activity  Outcome Measure  Difficulty turning over in bed (including adjusting bedclothes, sheets and blankets)?: None Difficulty moving from lying on back to sitting on the side of the bed? : None Difficulty sitting down on and standing up  from a chair with arms (e.g., wheelchair, bedside commode, etc,.)?: A Little Help needed moving to and from a bed to chair (including a wheelchair)?: None Help needed walking in hospital room?: None Help needed climbing 3-5 steps with a railing? : A Little 6 Click Score: 22    End of Session   Activity Tolerance: Patient tolerated treatment well Patient left: (Pt left ambulating in halls.  ) Nurse Communication: Mobility status(informed nursing that pt is safe to manage IV pole in halls on her own.  ) PT Visit Diagnosis: Other abnormalities of gait and mobility (R26.89);Muscle weakness (generalized) (M62.81)     Time: 7543-6067 PT Time Calculation (min) (ACUTE ONLY): 16 min  Charges:  $Gait Training: 8-22 mins                     Governor Rooks, PTA pager [place] 08/04/2018, 12:03 PM

## 2018-08-06 LAB — CULTURE, BLOOD (ROUTINE X 2)
Culture: NO GROWTH
Culture: NO GROWTH
Special Requests: ADEQUATE

## 2018-08-23 ENCOUNTER — Ambulatory Visit
Admission: RE | Admit: 2018-08-23 | Discharge: 2018-08-23 | Disposition: A | Discharge status: Home / Self Care | Payer: Medicare Other | Source: Ambulatory Visit | Attending: Internal Medicine | Admitting: Internal Medicine

## 2018-08-23 ENCOUNTER — Other Ambulatory Visit: Payer: Self-pay | Admitting: Internal Medicine

## 2018-08-23 DIAGNOSIS — R231 Pallor: Secondary | ICD-10-CM | POA: Diagnosis not present

## 2018-08-23 DIAGNOSIS — K219 Gastro-esophageal reflux disease without esophagitis: Secondary | ICD-10-CM | POA: Diagnosis not present

## 2018-08-23 DIAGNOSIS — M797 Fibromyalgia: Secondary | ICD-10-CM | POA: Diagnosis not present

## 2018-08-23 DIAGNOSIS — R109 Unspecified abdominal pain: Secondary | ICD-10-CM

## 2018-08-23 DIAGNOSIS — I10 Essential (primary) hypertension: Secondary | ICD-10-CM | POA: Diagnosis not present

## 2018-08-23 DIAGNOSIS — R1013 Epigastric pain: Secondary | ICD-10-CM | POA: Diagnosis not present

## 2018-08-23 DIAGNOSIS — Z758 Other problems related to medical facilities and other health care: Secondary | ICD-10-CM | POA: Diagnosis not present

## 2018-08-23 DIAGNOSIS — K429 Umbilical hernia without obstruction or gangrene: Secondary | ICD-10-CM | POA: Diagnosis not present

## 2018-08-23 DIAGNOSIS — M545 Low back pain: Secondary | ICD-10-CM | POA: Diagnosis not present

## 2018-08-23 MED ORDER — IOPAMIDOL (ISOVUE-300) INJECTION 61%
100.0000 mL | Freq: Once | INTRAVENOUS | Status: AC | PRN
  Administered 2018-08-23: 100 mL via INTRAVENOUS

## 2018-09-07 DIAGNOSIS — I10 Essential (primary) hypertension: Secondary | ICD-10-CM | POA: Diagnosis not present

## 2018-09-07 DIAGNOSIS — K432 Incisional hernia without obstruction or gangrene: Secondary | ICD-10-CM | POA: Diagnosis not present

## 2018-09-07 DIAGNOSIS — F419 Anxiety disorder, unspecified: Secondary | ICD-10-CM | POA: Diagnosis not present

## 2018-09-07 DIAGNOSIS — R002 Palpitations: Secondary | ICD-10-CM | POA: Diagnosis not present

## 2018-09-07 DIAGNOSIS — Z8269 Family history of other diseases of the musculoskeletal system and connective tissue: Secondary | ICD-10-CM | POA: Diagnosis not present

## 2018-09-07 DIAGNOSIS — M6530 Trigger finger, unspecified finger: Secondary | ICD-10-CM | POA: Diagnosis not present

## 2018-09-07 DIAGNOSIS — G35 Multiple sclerosis: Secondary | ICD-10-CM | POA: Diagnosis not present

## 2018-09-07 DIAGNOSIS — G609 Hereditary and idiopathic neuropathy, unspecified: Secondary | ICD-10-CM | POA: Diagnosis not present

## 2018-09-07 DIAGNOSIS — M797 Fibromyalgia: Secondary | ICD-10-CM | POA: Diagnosis not present

## 2018-09-07 DIAGNOSIS — R600 Localized edema: Secondary | ICD-10-CM | POA: Diagnosis not present

## 2018-09-07 DIAGNOSIS — R7309 Other abnormal glucose: Secondary | ICD-10-CM | POA: Diagnosis not present

## 2018-09-10 DIAGNOSIS — Z8269 Family history of other diseases of the musculoskeletal system and connective tissue: Secondary | ICD-10-CM | POA: Insufficient documentation

## 2018-09-18 ENCOUNTER — Inpatient Hospital Stay (HOSPITAL_COMMUNITY): Payer: Medicare Other

## 2018-09-18 ENCOUNTER — Emergency Department (HOSPITAL_COMMUNITY): Payer: Medicare Other

## 2018-09-18 ENCOUNTER — Encounter (HOSPITAL_COMMUNITY): Payer: Self-pay | Admitting: Emergency Medicine

## 2018-09-18 ENCOUNTER — Inpatient Hospital Stay (HOSPITAL_COMMUNITY)
Admission: EM | Admit: 2018-09-18 | Discharge: 2018-09-24 | DRG: 149 | Disposition: A | Discharge status: Home / Self Care | Payer: Medicare Other | Attending: Family Medicine | Admitting: Family Medicine

## 2018-09-18 DIAGNOSIS — Z888 Allergy status to other drugs, medicaments and biological substances status: Secondary | ICD-10-CM | POA: Diagnosis not present

## 2018-09-18 DIAGNOSIS — Z91010 Allergy to peanuts: Secondary | ICD-10-CM | POA: Diagnosis not present

## 2018-09-18 DIAGNOSIS — Z8673 Personal history of transient ischemic attack (TIA), and cerebral infarction without residual deficits: Secondary | ICD-10-CM | POA: Diagnosis not present

## 2018-09-18 DIAGNOSIS — Z885 Allergy status to narcotic agent status: Secondary | ICD-10-CM | POA: Diagnosis not present

## 2018-09-18 DIAGNOSIS — R0789 Other chest pain: Secondary | ICD-10-CM | POA: Diagnosis present

## 2018-09-18 DIAGNOSIS — Z79899 Other long term (current) drug therapy: Secondary | ICD-10-CM

## 2018-09-18 DIAGNOSIS — Z6841 Body Mass Index (BMI) 40.0 and over, adult: Secondary | ICD-10-CM | POA: Diagnosis not present

## 2018-09-18 DIAGNOSIS — M329 Systemic lupus erythematosus, unspecified: Secondary | ICD-10-CM | POA: Diagnosis present

## 2018-09-18 DIAGNOSIS — R42 Dizziness and giddiness: Secondary | ICD-10-CM

## 2018-09-18 DIAGNOSIS — E860 Dehydration: Secondary | ICD-10-CM | POA: Diagnosis present

## 2018-09-18 DIAGNOSIS — H8112 Benign paroxysmal vertigo, left ear: Secondary | ICD-10-CM | POA: Diagnosis present

## 2018-09-18 DIAGNOSIS — R002 Palpitations: Secondary | ICD-10-CM | POA: Diagnosis not present

## 2018-09-18 DIAGNOSIS — I639 Cerebral infarction, unspecified: Secondary | ICD-10-CM

## 2018-09-18 DIAGNOSIS — R41841 Cognitive communication deficit: Secondary | ICD-10-CM | POA: Diagnosis present

## 2018-09-18 DIAGNOSIS — H538 Other visual disturbances: Secondary | ICD-10-CM | POA: Diagnosis not present

## 2018-09-18 DIAGNOSIS — E785 Hyperlipidemia, unspecified: Secondary | ICD-10-CM | POA: Diagnosis present

## 2018-09-18 DIAGNOSIS — G44209 Tension-type headache, unspecified, not intractable: Secondary | ICD-10-CM | POA: Diagnosis present

## 2018-09-18 DIAGNOSIS — G4733 Obstructive sleep apnea (adult) (pediatric): Secondary | ICD-10-CM | POA: Diagnosis present

## 2018-09-18 DIAGNOSIS — Z87891 Personal history of nicotine dependence: Secondary | ICD-10-CM

## 2018-09-18 DIAGNOSIS — G35 Multiple sclerosis: Secondary | ICD-10-CM | POA: Diagnosis present

## 2018-09-18 DIAGNOSIS — F329 Major depressive disorder, single episode, unspecified: Secondary | ICD-10-CM | POA: Diagnosis present

## 2018-09-18 DIAGNOSIS — M797 Fibromyalgia: Secondary | ICD-10-CM | POA: Diagnosis present

## 2018-09-18 DIAGNOSIS — F17210 Nicotine dependence, cigarettes, uncomplicated: Secondary | ICD-10-CM | POA: Diagnosis not present

## 2018-09-18 DIAGNOSIS — I5189 Other ill-defined heart diseases: Secondary | ICD-10-CM | POA: Diagnosis not present

## 2018-09-18 DIAGNOSIS — R5383 Other fatigue: Secondary | ICD-10-CM | POA: Diagnosis not present

## 2018-09-18 DIAGNOSIS — Z136 Encounter for screening for cardiovascular disorders: Secondary | ICD-10-CM | POA: Diagnosis not present

## 2018-09-18 DIAGNOSIS — Z801 Family history of malignant neoplasm of trachea, bronchus and lung: Secondary | ICD-10-CM | POA: Diagnosis not present

## 2018-09-18 DIAGNOSIS — R079 Chest pain, unspecified: Secondary | ICD-10-CM | POA: Diagnosis not present

## 2018-09-18 DIAGNOSIS — I10 Essential (primary) hypertension: Secondary | ICD-10-CM | POA: Diagnosis present

## 2018-09-18 DIAGNOSIS — I5030 Unspecified diastolic (congestive) heart failure: Secondary | ICD-10-CM | POA: Diagnosis not present

## 2018-09-18 HISTORY — DX: Obstructive sleep apnea (adult) (pediatric): G47.33

## 2018-09-18 HISTORY — DX: Palpitations: R00.2

## 2018-09-18 HISTORY — DX: Reserved for concepts with insufficient information to code with codable children: IMO0002

## 2018-09-18 HISTORY — DX: Major depressive disorder, single episode, unspecified: F32.9

## 2018-09-18 HISTORY — DX: Systemic lupus erythematosus, unspecified: M32.9

## 2018-09-18 HISTORY — DX: Depression, unspecified: F32.A

## 2018-09-18 HISTORY — DX: Morbid (severe) obesity due to excess calories: E66.01

## 2018-09-18 HISTORY — DX: Obsessive-compulsive disorder, unspecified: F42.9

## 2018-09-18 LAB — BASIC METABOLIC PANEL
Anion gap: 11 (ref 5–15)
BUN: 8 mg/dL (ref 6–20)
CO2: 24 mmol/L (ref 22–32)
Calcium: 9.6 mg/dL (ref 8.9–10.3)
Chloride: 105 mmol/L (ref 98–111)
Creatinine, Ser: 0.78 mg/dL (ref 0.44–1.00)
GFR calc Af Amer: >60 mL/min (ref >60)
GFR calc non Af Amer: >60 mL/min (ref >60)
Glucose, Bld: 108 mg/dL — ABNORMAL HIGH (ref 70–99)
Potassium: 4 mmol/L (ref 3.5–5.1)
Sodium: 140 mmol/L (ref 135–145)

## 2018-09-18 LAB — LIPID PANEL
Cholesterol: 205 mg/dL — ABNORMAL HIGH (ref 0–200)
HDL: 38 mg/dL — ABNORMAL LOW (ref >40)
LDL Cholesterol: 141 mg/dL — ABNORMAL HIGH (ref 0–99)
Total CHOL/HDL Ratio: 5.4 RATIO
Triglycerides: 129 mg/dL (ref <150)
VLDL: 26 mg/dL (ref 0–40)

## 2018-09-18 LAB — CBC
HCT: 50.4 % — ABNORMAL HIGH (ref 36.0–46.0)
Hemoglobin: 15.9 g/dL — ABNORMAL HIGH (ref 12.0–15.0)
MCH: 28.3 pg (ref 26.0–34.0)
MCHC: 31.5 g/dL (ref 30.0–36.0)
MCV: 89.7 fL (ref 78.0–100.0)
Platelets: 266 10*3/uL (ref 150–400)
RBC: 5.62 MIL/uL — ABNORMAL HIGH (ref 3.87–5.11)
RDW: 14.3 % (ref 11.5–15.5)
WBC: 9.7 10*3/uL (ref 4.0–10.5)

## 2018-09-18 LAB — I-STAT TROPONIN, ED: Troponin i, poc: 0.02 ng/mL (ref 0.00–0.08)

## 2018-09-18 LAB — HEPATIC FUNCTION PANEL
ALT: 16 U/L (ref 0–44)
AST: 21 U/L (ref 15–41)
Albumin: 3.8 g/dL (ref 3.5–5.0)
Alkaline Phosphatase: 99 U/L (ref 38–126)
Bilirubin, Direct: <0.1 mg/dL (ref 0.0–0.2)
Total Bilirubin: 0.5 mg/dL (ref 0.3–1.2)
Total Protein: 8.2 g/dL — ABNORMAL HIGH (ref 6.5–8.1)

## 2018-09-18 LAB — PROTIME-INR
INR: 0.94
Prothrombin Time: 12.4 seconds (ref 11.4–15.2)

## 2018-09-18 LAB — I-STAT BETA HCG BLOOD, ED (MC, WL, AP ONLY): I-stat hCG, quantitative: <5 m[IU]/mL (ref <5)

## 2018-09-18 LAB — APTT: aPTT: 32 seconds (ref 24–36)

## 2018-09-18 LAB — HEMOGLOBIN A1C
Hgb A1c MFr Bld: 5.8 % — ABNORMAL HIGH (ref 4.8–5.6)
Mean Plasma Glucose: 119.76 mg/dL

## 2018-09-18 MED ORDER — FUROSEMIDE 20 MG PO TABS
20.0000 mg | ORAL_TABLET | ORAL | Status: DC
  Administered 2018-09-19 – 2018-09-24 (×6): 20 mg via ORAL
  Filled 2018-09-18 (×7): qty 1

## 2018-09-18 MED ORDER — FLUOXETINE HCL 20 MG PO CAPS
60.0000 mg | ORAL_CAPSULE | Freq: Every day | ORAL | Status: DC
  Administered 2018-09-19 – 2018-09-24 (×6): 60 mg via ORAL
  Filled 2018-09-18 (×6): qty 3

## 2018-09-18 MED ORDER — PROCHLORPERAZINE EDISYLATE 10 MG/2ML IJ SOLN
10.0000 mg | Freq: Four times a day (QID) | INTRAMUSCULAR | Status: DC | PRN
  Administered 2018-09-19 – 2018-09-20 (×2): 10 mg via INTRAVENOUS
  Filled 2018-09-18 (×2): qty 2

## 2018-09-18 MED ORDER — DIAZEPAM 5 MG/ML IJ SOLN
5.0000 mg | Freq: Once | INTRAMUSCULAR | Status: AC
  Administered 2018-09-18: 5 mg via INTRAVENOUS
  Filled 2018-09-18: qty 2

## 2018-09-18 MED ORDER — ACETAMINOPHEN 160 MG/5ML PO SOLN: 650.0000 mg | ORAL | Status: DC | PRN

## 2018-09-18 MED ORDER — CYCLOBENZAPRINE HCL 10 MG PO TABS
10.0000 mg | ORAL_TABLET | Freq: Once | ORAL | Status: AC
  Administered 2018-09-18: 10 mg via ORAL
  Filled 2018-09-18: qty 1

## 2018-09-18 MED ORDER — DIPHENHYDRAMINE HCL 25 MG PO CAPS
25.0000 mg | ORAL_CAPSULE | Freq: Once | ORAL | Status: AC
  Administered 2018-09-18: 25 mg via ORAL
  Filled 2018-09-18: qty 1

## 2018-09-18 MED ORDER — GABAPENTIN 600 MG PO TABS
600.0000 mg | ORAL_TABLET | Freq: Three times a day (TID) | ORAL | Status: DC
  Administered 2018-09-18 – 2018-09-24 (×17): 600 mg via ORAL
  Filled 2018-09-18 (×17): qty 1

## 2018-09-18 MED ORDER — ACETAMINOPHEN 325 MG PO TABS
650.0000 mg | ORAL_TABLET | ORAL | Status: DC | PRN
  Administered 2018-09-18 – 2018-09-19 (×2): 650 mg via ORAL
  Filled 2018-09-18 (×3): qty 2

## 2018-09-18 MED ORDER — SODIUM CHLORIDE 0.9 % IV BOLUS
1000.0000 mL | Freq: Once | INTRAVENOUS | Status: AC
  Administered 2018-09-18: 1000 mL via INTRAVENOUS

## 2018-09-18 MED ORDER — IOPAMIDOL (ISOVUE-370) INJECTION 76%
50.0000 mL | Freq: Once | INTRAVENOUS | Status: AC | PRN
  Administered 2018-09-18: 50 mL via INTRAVENOUS

## 2018-09-18 MED ORDER — LABETALOL HCL 5 MG/ML IV SOLN: 10.0000 mg | Freq: Once | INTRAVENOUS | Status: DC | PRN

## 2018-09-18 MED ORDER — STROKE: EARLY STAGES OF RECOVERY BOOK
Freq: Once | Status: DC
  Filled 2018-09-18: qty 1

## 2018-09-18 MED ORDER — METOCLOPRAMIDE HCL 5 MG/ML IJ SOLN
10.0000 mg | Freq: Once | INTRAMUSCULAR | Status: AC
  Administered 2018-09-18: 10 mg via INTRAVENOUS
  Filled 2018-09-18: qty 2

## 2018-09-18 MED ORDER — IOPAMIDOL (ISOVUE-370) INJECTION 76%
INTRAVENOUS | Status: AC
  Filled 2018-09-18: qty 50

## 2018-09-18 MED ORDER — ACETAMINOPHEN 650 MG RE SUPP: 650.0000 mg | RECTAL | Status: DC | PRN

## 2018-09-18 MED ORDER — PANTOPRAZOLE SODIUM 40 MG PO TBEC
40.0000 mg | DELAYED_RELEASE_TABLET | Freq: Every day | ORAL | Status: DC
  Administered 2018-09-19 – 2018-09-24 (×6): 40 mg via ORAL
  Filled 2018-09-18 (×6): qty 1

## 2018-09-18 MED ORDER — CLOPIDOGREL BISULFATE 75 MG PO TABS
75.0000 mg | ORAL_TABLET | Freq: Every day | ORAL | Status: DC
  Administered 2018-09-19 – 2018-09-24 (×6): 75 mg via ORAL
  Filled 2018-09-18 (×6): qty 1

## 2018-09-18 MED ORDER — ENOXAPARIN SODIUM 40 MG/0.4ML ~~LOC~~ SOLN
40.0000 mg | SUBCUTANEOUS | Status: DC
  Administered 2018-09-18 – 2018-09-23 (×6): 40 mg via SUBCUTANEOUS
  Filled 2018-09-18 (×6): qty 0.4

## 2018-09-18 MED ORDER — DIPHENHYDRAMINE HCL 50 MG/ML IJ SOLN
12.5000 mg | Freq: Four times a day (QID) | INTRAMUSCULAR | Status: DC | PRN
  Administered 2018-09-19 – 2018-09-20 (×3): 12.5 mg via INTRAVENOUS
  Filled 2018-09-18 (×3): qty 1

## 2018-09-18 MED ORDER — CLONIDINE HCL 0.1 MG PO TABS: 0.1000 mg | ORAL_TABLET | Freq: Every day | ORAL | Status: DC

## 2018-09-18 MED ORDER — CYCLOBENZAPRINE HCL 10 MG PO TABS
10.0000 mg | ORAL_TABLET | Freq: Three times a day (TID) | ORAL | Status: DC | PRN
  Administered 2018-09-19 – 2018-09-24 (×6): 10 mg via ORAL
  Filled 2018-09-18 (×6): qty 1

## 2018-09-18 MED ORDER — LORAZEPAM 2 MG/ML IJ SOLN
0.5000 mg | Freq: Once | INTRAMUSCULAR | Status: AC
  Administered 2018-09-18: 0.5 mg via INTRAVENOUS
  Filled 2018-09-18: qty 1

## 2018-09-18 NOTE — ED Provider Notes (Signed)
[facility] EMERGENCY DEPARTMENT Provider Note   CSN: [id number] Arrival date & time: 09/18/18  0973     History   Chief Complaint Chief Complaint  Patient presents with  . Dizziness  . Palpitations    HPI Megan Dixon is a 52 y.o. female.  Patient with history of hypertension, multiple sclerosis, lupus, recent admission for sepsis --presents to the emergency department with acute onset of dizziness described as vertigo, palpitations, chest pain which is now resolved yesterday after waking up.  Symptoms have been ongoing for more than 24 hours now.  Patient has been having difficulty with ambulation and is needed to hold onto objects to walk.  She has also had a couple episodes of vomiting, last episode was this morning.  She denies any fever or headache.  She denies weakness in her arms or her legs.  No facial droop or slurred speech.  No history of stroke.  No vision changes or loss of vision.  No history of heart problems or heart attack.  She has not had syncope or lightheadedness.  Onset of symptoms acute.  Course is persistent.  Nothing makes symptoms better.  Movement makes his symptoms worse.     Past Medical History:  Diagnosis Date  . Arthritis   . Fibromyalgia   . Hypertension   . Lupus ([place])   . MS (multiple sclerosis) ([place])   . Pneumonia 07/2018   left lung    Patient Active Problem List   Diagnosis Date Noted  . Sepsis due to pneumonia ([place]) 08/01/2018  . MS (multiple sclerosis) ([place]) 08/01/2018  . Hypertension 08/01/2018  . Fibromyalgia 08/01/2018  . Blood-tinged sputum 08/01/2018  . Community acquired pneumonia of left lung   . Incarcerated incisional hernia 08/10/2017  . S/P exploratory laparotomy 08/09/2017    Past Surgical History:  Procedure Laterality Date  . ABDOMINAL SURGERY    . HERNIA REPAIR    . JOINT REPLACEMENT    . VENTRAL HERNIA REPAIR N/A 08/09/2017   Procedure: Incarcerated ventral hernia repair with mesh ;   Surgeon: Rolm Bookbinder, MD;  Location: [facility];  Service: General;  Laterality: N/A;     OB History   None      Home Medications    Prior to Admission medications   Medication Sig Start Date End Date Taking? Authorizing Provider  acetaminophen (TYLENOL) 325 MG tablet Take 650 mg by mouth every 6 (six) hours as needed for moderate pain or headache.   Yes [provider]  cloNIDine (CATAPRES) 0.1 MG tablet Take 0.1 mg by mouth at bedtime. 12/17/17  Yes [provider]  cyclobenzaprine (FLEXERIL) 10 MG tablet Take 10 mg by mouth 3 (three) times daily as needed for muscle spasms.   Yes [provider]  FLUoxetine (PROZAC) 20 MG capsule Take 60 mg by mouth daily.   Yes [provider]  furosemide (LASIX) 20 MG tablet Take 20 mg by mouth every morning. 09/07/18  Yes [provider]  gabapentin (NEURONTIN) 600 MG tablet Take 600 mg by mouth 3 (three) times daily.    Yes [provider]  HYDROcodone-acetaminophen (NORCO) 5-325 MG tablet Take 1 tablet by mouth every 6 (six) hours as needed for moderate pain. Patient taking differently: Take 1 tablet by mouth every 8 (eight) hours as needed for moderate pain.  08/13/17  Yes Kinsinger, Arta Bruce, MD  pantoprazole (PROTONIX) 40 MG tablet Take 40 mg by mouth daily.   Yes [provider]  meclizine (  ANTIVERT) 25 MG tablet Take 1 tablet (25 mg total) by mouth 3 (three) times daily as needed for dizziness. Patient not taking: Reported on 09/18/2018 02/11/18   Margarita Mail, PA-C  ondansetron (ZOFRAN ODT) 4 MG disintegrating tablet Take 1 tablet (4 mg total) by mouth every 8 (eight) hours as needed for nausea or vomiting. Patient not taking: Reported on 09/18/2018 08/13/17   Kinsinger, Arta Bruce, MD  polyethylene glycol [facility]) packet Mix 8 cap fulls in 32 ounces of juice/water/gatorade and drink over the course of a day. Once your stool is loose or liquid, stop taking. Patient not taking:  Reported on 09/18/2018 02/04/17   Duffy Bruce, MD    Family History Family History  Problem Relation Age of Onset  . Lung cancer Mother     Social History Social History   Tobacco Use  . Smoking status: Former Smoker    Types: Cigarettes    Last attempt to quit: 05/11/2018    Years since quitting: 0.3  . Smokeless tobacco: Never Used  Substance Use Topics  . Alcohol use: Yes  . Drug use: Never     Allergies   Morphine and related; Other; Peanut-containing drug products; and Toradol [ketorolac tromethamine]   Review of Systems Review of Systems  Constitutional: Negative for fever.  HENT: Negative for congestion, dental problem, rhinorrhea and sinus pressure.   Eyes: Negative for photophobia, discharge, redness and visual disturbance.  Respiratory: Negative for shortness of breath.   Cardiovascular: Positive for palpitations. Negative for chest pain.  Gastrointestinal: Positive for nausea and vomiting. Negative for abdominal pain.  Musculoskeletal: Negative for gait problem, neck pain and neck stiffness.  Skin: Negative for rash.  Neurological: Positive for dizziness. Negative for syncope, facial asymmetry, speech difficulty, weakness, light-headedness, numbness and headaches.  Psychiatric/Behavioral: Negative for confusion.     Physical Exam Updated Vital Signs BP (!) 178/107   Pulse 92   Temp 98.8 F (37.1 C)   Resp (!) 23   SpO2 94%   Physical Exam  Constitutional: She is oriented to person, place, and time. She appears well-developed and well-nourished.  HENT:  Head: Normocephalic and atraumatic.  Right Ear: Tympanic membrane, external ear and ear canal normal.  Left Ear: Tympanic membrane, external ear and ear canal normal.  Nose: Nose normal.  Mouth/Throat: Uvula is midline, oropharynx is clear and moist and mucous membranes are normal.  Eyes: Pupils are equal, round, and reactive to light. Conjunctivae, EOM and lids are normal. Right eye exhibits no  nystagmus. Left eye exhibits no nystagmus.  Horizontal nystagmus present -- exam made difficult as she has some trouble tracking finger and twitching of eyelids while tracking.   Neck: Normal range of motion. Neck supple.  Cardiovascular: Normal rate and regular rhythm.  Pulmonary/Chest: Effort normal and breath sounds normal.  Abdominal: Soft. There is no tenderness.  Musculoskeletal:       Cervical back: She exhibits normal range of motion, no tenderness and no bony tenderness.  Neurological: She is alert and oriented to person, place, and time. She has normal strength and normal reflexes. No cranial nerve deficit or sensory deficit. She displays a negative Romberg sign. GCS eye subscore is 4. GCS verbal subscore is 5. GCS motor subscore is 6.  Skin: Skin is warm and dry.  Psychiatric: She has a normal mood and affect.  Nursing note and vitals reviewed.    ED Treatments / Results  Labs (all labs ordered are listed, but only abnormal results are displayed) Labs  Reviewed  BASIC METABOLIC PANEL - Abnormal; Notable for the following components:      Result Value   Glucose, Bld 108 (*)    All other components within normal limits  CBC - Abnormal; Notable for the following components:   RBC 5.62 (*)    Hemoglobin 15.9 (*)    HCT 50.4 (*)    All other components within normal limits  PROTIME-INR  APTT  HEPATIC FUNCTION PANEL  I-STAT TROPONIN, ED  I-STAT BETA HCG BLOOD, ED ([facility], [facility], [facility] ONLY)    ED ECG REPORT   Date: 09/18/2018  Rate: 83  Rhythm: normal sinus rhythm  QRS Axis: normal  Intervals: QT prolonged  ST/T Wave abnormalities: nonspecific ST/T changes  Conduction Disutrbances:none  Narrative Interpretation:   Old EKG Reviewed: changes noted QT longer today  I have personally reviewed the EKG tracing and agree with the computerized printout as noted.  Radiology Dg Chest 2 View  Result Date: 09/18/2018 CLINICAL DATA:  Chest pain, palpitations EXAM: CHEST - 2 VIEW  COMPARISON:  08/02/2018 FINDINGS: Cardiomegaly. No confluent airspace opacities or effusions. No acute bony abnormality. IMPRESSION: Cardiomegaly.  No active disease. Electronically Signed   By: Rolm Baptise M.D.   On: 09/18/2018 10:55    Procedures Procedures (including critical care time)  Medications Ordered in ED Medications  metoCLOPramide (REGLAN) injection 10 mg (has no administration in time range)  diphenhydrAMINE (BENADRYL) capsule 25 mg (has no administration in time range)  diazepam (VALIUM) injection 5 mg (5 mg Intravenous Given 09/18/18 1116)  sodium chloride 0.9 % bolus 1,000 mL (0 mLs Intravenous Stopped 09/18/18 1247)  LORazepam (ATIVAN) injection 0.5 mg (0.5 mg Intravenous Given 09/18/18 1340)     Initial Impression / Assessment and Plan / ED Course  I have reviewed the triage vital signs and the nursing notes.  Pertinent labs & imaging results that were available during my care of the patient were reviewed by me and considered in my medical decision making (see chart for details).     Patient seen and examined. Work-up initiated. Medications ordered.  EKG reviewed.  No concern for intracerebral hemorrhage given no significant headache.  Will attempt to treat symptoms and recheck.  Vital signs reviewed and are as follows: BP (!) 178/107   Pulse 92   Temp 98.8 F (37.1 C)   Resp (!) 23   SpO2 94%   Work-up reassuring to this point.  Patient continues to have significant dizziness despite treatment with IV Valium.  Given her risk factors for stroke, feel it is prudent to rule out posterior circulation stroke with an MRI.  This was ordered.  We will continue to treat symptoms.  Reviewed previous CT angiography performed in 02/2018.  No previous echo.  3:22 PM patient is not feeling better despite multiple doses of benzodiazepine.  I have ordered Reglan and Benadryl.  I reviewed MRI results with Dr. Leonel Ramsay.  He will consult on patient.  Patient does not feel  well enough to walk across the room let alone going home.  Will request hospitalist admission for symptom control and further work-up.  3:36 PM spoke previously with family practice who will see patient for admission.  PCP is Dr. Maia Petties of [facility].   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   ED Discharge Orders  None       Carlisle Cater, PA-C 09/18/18 1537    Pattricia Boss, MD 09/18/18 [phone]

## 2018-09-18 NOTE — ED Notes (Signed)
Pt to ultrasound at this time.

## 2018-09-18 NOTE — ED Notes (Signed)
Admitting MD in to assess pt. 

## 2018-09-18 NOTE — Consult Note (Addendum)
NEURO HOSPITALIST CONSULT NOTE   Requestig physician: Dr. Jeanell Sparrow   Reason for Consult: dizziness   History obtained from:  Patient    HPI:                                                                                                                                          Megan Dixon is an 52 y.o. female  PMH of HTN, MS, lupus, presents to [facility] ED with complaints of dizziness.   Patient states that since Friday 09/16/18 she has not felt like herself. She just did not feel right. Then on Saturday morning when she woke up the dizziness, palpitations (resolved) started. She describes this as feeling like the room is moving, and when she walks the floor appears to move. She has to hold onto the walls to help herself navigate to the bathroom at home. She also reports that she has times where her "tongue feel thick" and her speech is slurred. This has resolved, but it comes and goes. She has had episodes of stuttering and having trouble getting her words out. Right before she vomits she will become really hot and she feels like her brain is on fire, she starts to sweat and then she vomits.  Denies any hearing loss, ringing in the ears, recent illnesses. Moving makes it worse,  And staying still makes it better. currently endorses a tension type HA at the back of her head that radiates down to her shoulders since Friday, she has taken hydrocodone which helped relieve the HA, but has not taken anything today d/t nausea. Has had emesis. Denies nay SOB, ETOH, vaping, drug abuse. currently smokes 1/2 pack a day for about 15 years.   Ed course: BP:178/107 BG:108 MRI: no acute abnormality; old left small cerebellar infarct  Past Medical History:  Diagnosis Date  . Arthritis   . Fibromyalgia   . Hypertension   . Lupus ([place])   . MS (multiple sclerosis) ([place])   . Pneumonia 07/2018   left lung    Past Surgical History:  Procedure Laterality Date  . ABDOMINAL SURGERY    .  HERNIA REPAIR    . JOINT REPLACEMENT    . VENTRAL HERNIA REPAIR N/A 08/09/2017   Procedure: Incarcerated ventral hernia repair with mesh ;  Surgeon: Rolm Bookbinder, MD;  Location: [facility];  Service: General;  Laterality: N/A;    Family History  Problem Relation Age of Onset  . Lung cancer Mother     Social History:  reports that she quit smoking about 4 months ago. Her smoking use included cigarettes. She has never used smokeless tobacco. She reports that she drinks alcohol. She reports that she does not use drugs.  Allergies  Allergen Reactions  . Morphine And Related Other (See  Comments)    Headache  . Other Other (See Comments)    CANNOT EAT ANYTHING WITH SEEDS (DIVERTICULITIS)  . Peanut-Containing Drug Products Other (See Comments)    Has Diverticulitis   . Toradol [Ketorolac Tromethamine] Anxiety    MEDICATIONS:                                                                                                                     Current Facility-Administered Medications  Medication Dose Route Frequency Provider Last Rate Last Dose  . diphenhydrAMINE (BENADRYL) capsule 25 mg  25 mg Oral Once Carlisle Cater, PA-C      . metoCLOPramide (REGLAN) injection 10 mg  10 mg Intravenous Once Carlisle Cater, PA-C       Current Outpatient Medications  Medication Sig Dispense Refill  . acetaminophen (TYLENOL) 325 MG tablet Take 650 mg by mouth every 6 (six) hours as needed for moderate pain or headache.    . cloNIDine (CATAPRES) 0.1 MG tablet Take 0.1 mg by mouth at bedtime.  3  . cyclobenzaprine (FLEXERIL) 10 MG tablet Take 10 mg by mouth 3 (three) times daily as needed for muscle spasms.    Marland Kitchen FLUoxetine (PROZAC) 20 MG capsule Take 60 mg by mouth daily.    . furosemide (LASIX) 20 MG tablet Take 20 mg by mouth every morning.  0  . gabapentin (NEURONTIN) 600 MG tablet Take 600 mg by mouth 3 (three) times daily.     Marland Kitchen HYDROcodone-acetaminophen (NORCO) 5-325 MG tablet Take 1 tablet by mouth  every 6 (six) hours as needed for moderate pain. (Patient taking differently: Take 1 tablet by mouth every 8 (eight) hours as needed for moderate pain. ) 30 tablet 0  . pantoprazole (PROTONIX) 40 MG tablet Take 40 mg by mouth daily.    . meclizine (ANTIVERT) 25 MG tablet Take 1 tablet (25 mg total) by mouth 3 (three) times daily as needed for dizziness. (Patient not taking: Reported on 09/18/2018) 30 tablet 0  . ondansetron (ZOFRAN ODT) 4 MG disintegrating tablet Take 1 tablet (4 mg total) by mouth every 8 (eight) hours as needed for nausea or vomiting. (Patient not taking: Reported on 09/18/2018) 20 tablet 0  . polyethylene glycol (MIRALAX) packet Mix 8 cap fulls in 32 ounces of juice/water/gatorade and drink over the course of a day. Once your stool is loose or liquid, stop taking. (Patient not taking: Reported on 09/18/2018) 14 each 0      ROS:  ROS was performed and is negative except as noted in HPI    Blood pressure (!) 178/107, pulse 92, temperature 98.8 F (37.1 C), resp. rate (!) 23, SpO2 94 %.   General Examination:                                                                                                       Physical Exam  HEENT-  Normocephalic, no lesions, without obvious abnormality.  Normal external eye and conjunctiva.   Cardiovascular- S1-S2 audible, pulses palpable throughout   Lungs-no rhonchi or wheezing noted, no excessive working breathing.  Saturations within normal limits Abdomen- All 4 quadrants palpated and nontender Extremities- Warm, dry and intact Musculoskeletal-no joint tenderness, deformity or swelling Skin-warm and dry, no hyperpigmentation, vitiligo, or suspicious lesions  Neurological Examination Mental Status: Alert, oriented, thought content appropriate.  Speech fluent without evidence of aphasia. No dysarthria  noted. Able to follow  commands without difficulty. Cranial Nerves: II:  Visual fields grossly normal,  III,IV, VI: ptosis not present, end gaze nystagmus,Also had diplopia at this time. extra-ocular motions intact bilaterally pupils equal, round, reactive to light and accommodation V,VII: smile symmetric, facial light touch sensation normal bilaterally VIII: hearing normal bilaterally IX,X: uvula rises symmetrically XI: bilateral shoulder shrug XII: midline tongue extension Motor: Right : Upper extremity   5/5  Left:     Upper extremity   5/5  Lower extremity   5/5   Lower extremity   5/5 Tone and bulk:normal tone throughout; no atrophy noted Sensory: Pinprick and light touch intact throughout, bilaterally Deep Tendon Reflexes: 2+ and symmetric biceps, patella Plantars: Right: downgoing   Left: downgoing Cerebellar: normal finger-to-nose,   Gait: deferred   Lab Results: Basic Metabolic Panel: Recent Labs  Lab 09/18/18 0948  NA 140  K 4.0  CL 105  CO2 24  GLUCOSE 108*  BUN 8  CREATININE 0.78  CALCIUM 9.6    CBC: Recent Labs  Lab 09/18/18 0948  WBC 9.7  HGB 15.9*  HCT 50.4*  MCV 89.7  PLT 266   Imaging: Dg Chest 2 View  Result Date: 09/18/2018 CLINICAL DATA:  Chest pain, palpitations EXAM: CHEST - 2 VIEW COMPARISON:  08/02/2018 FINDINGS: Cardiomegaly. No confluent airspace opacities or effusions. No acute bony abnormality. IMPRESSION: Cardiomegaly.  No active disease. Electronically Signed   By: Rolm Baptise M.D.   On: 09/18/2018 10:55   Mr Brain Wo Contrast  Result Date: 09/18/2018 CLINICAL DATA:  52 year old female with dizziness for 2 days. Blurred vision, severe pressure in the head and chest. EXAM: MRI HEAD WITHOUT CONTRAST TECHNIQUE: Multiplanar, multiecho pulse sequences of the brain and surrounding structures were obtained without intravenous contrast. COMPARISON:  Brain MRI and CTA head and neck 02/11/2018. FINDINGS: Brain: A small chronic infarct in the  left cerebellum is chronic but new since 02/11/2018 (series 7, image 7). No restricted diffusion or evidence of acute infarction. Signal in the brainstem and deep gray matter nuclei remains normal. Scattered small cerebral white matter T2 and FLAIR hyperintense foci appear stable and are in a nonspecific configuration.  No cortical encephalomalacia or chronic cerebral blood products. No midline shift, mass effect, evidence of mass lesion, ventriculomegaly, extra-axial collection or acute intracranial hemorrhage. Cervicomedullary junction and pituitary are within normal limits. Vascular: Major intracranial vascular flow voids are stable and preserved. Skull and upper cervical spine: Negative visible cervical spine. Normal bone marrow signal. Sinuses/Orbits: Stable and negative. Other: Stable adenoid hypertrophy. Mastoids remain clear. Visible internal auditory structures appear normal. Scalp and face soft tissues appear negative. IMPRESSION: 1. There is no acute infarct or acute intracranial abnormality, but a small left cerebellar infarct is new since the March 2019 MRI. 2. Stable MRI appearance the brain otherwise, with moderate for age nonspecific white matter signal changes. Electronically Signed   By: Genevie Ann M.D.   On: 09/18/2018 14:33   Laurey Morale, MSN, NP-C [facility] [phone]  Attending neurologist's note to follow I have seen the patient reviewed the above note.  On my exam, she has me left-sided weakness to confrontation, however she does not display any drift.  She does have a distractible tremor in her left arm.  Assessment:  52 y.o. female  PMH of HTN, MS, lupus, presents to [facility] ED with complaints of dizziness.  I have no findings on her physical exam to support either peripheral or central vertigo, nor is there any evidence on imaging of clear ischemia or vertebral dissection.  One consideration would be complicated migraine given her description of occipital headache.    There are some findings on her exam which make me wonder about a nonorganic etiology to her symptoms, but given her history of posterior circulation infarct I would favor repeating an MRI with thin cuts through the brainstem and cerebellum.  Also since she was not aware of this in the past, I think would be reasonable to check an echo and lipid panel to make sure secondary stroke prevention is being done.   Recommendations: -- BP goal : Permissive HTN upto 220/110 mmHg   --repeat MRI Brain ( ordered) --Echocardiogram ( ordered) --Plavix 75 mg daily(she does not tolerate aspirin) -- High intensity Statin if LDL > 70 -- HgbA1c, fasting lipid panel -- PT consult, OT consult, Speech consult --Telemetry monitoring --Frequent neuro checks --Stroke swallow screen   I also would treat with a migraine cocktail. --flexeril 10 mg x 1 dose ( ordered) --Benadryl 12.5mg  IV Q6 PRN --Compazine 10mg  IV Q6 PRN -- Labetalol 10 mg for BP > 220/110 unless second MRI is negative, if it is negative then I would control her BP more aggressively.   Roland Rack, MD [facility] [phone]  If 7pm- 7am, please page neurology on call as listed in [place].  09/18/2018, 3:11 PM

## 2018-09-18 NOTE — ED Notes (Addendum)
Pt returned from CT.  B/P 151/94

## 2018-09-18 NOTE — ED Notes (Signed)
This RN spoke with Laurey Morale, NP in ref. To pt's blood pressure.  No new orders recieved

## 2018-09-18 NOTE — ED Triage Notes (Signed)
Pt reports she began having cp, palpitations and dizziness yesterday. Has also had some vomiting. Pt a/ox4, resp e/u.

## 2018-09-18 NOTE — ED Notes (Signed)
Pt attempted to get up to go to bathroom, became very dizzy and had to lay back down.  Pure wick put in pt.

## 2018-09-18 NOTE — H&P (Addendum)
[facility] Admission History and Physical Service Pager: [phone]  Patient name: Megan Dixon Medical record number: [id number] Date of birth: [birth year] Age: 52 y.o. Gender: female  Primary Care Provider: System, Provider Not In Consultants: Neurology Code Status: Full Code  Chief Complaint: Dizziness and intermittent chest pain  Assessment and Plan: Megan Dixon is a 52 y.o. female presenting with vertigo like dizziness and intermittent chest pain. PMH is significant for HTN and recent admission for sepsis in August. Reported history of Multiple Sclerosis, although patient has not had workup.   Intractable Vertiginous Symptoms: Dizziness for two days that has impaired ambulation with one episode of NBNB emesis. Not relieved with valium in ED. Denies weakness or facial droop. 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  CBC and CMP WNL. CTA head and neck negative for occlusion of carotids or vertebral arteries with only mild intracranial atherosclerosis. Abdominal aortic ultrasound negative for AAA. Sudden onset vertigo with associated nausea/vomiting, and nystagmus is most consistent with vestibular neuronitis/labrinthitis and posterior stroke. However, since it appears to be positional, BPPV is also possible. Although MRI is positive for old left cerebellar infarct, new since March 2019, it seems unlikely it would just be presenting these acute symptoms. Due to recent hospitalization for CAP/sepsis, vestibular neuronitis/labrinthitis is possible. Neurology consulted and will follow-up neurology recs at this time. Could consider prednisone if felt to be due to labyrinthitis/post infectious vertigo although this is more a diagnosis of exclusion.  -Admit to inpatient, telemetry, attending physician Dr. Erin Hearing - Neurology consulted, will follow up  - MRI brain without contrast and Echo w/ bubble ordered  - Begin ASA 81mg  - Plavix  75mg   - Benadryl 12.5 mg IV PRN for headache - Lobetalol IV PRN - Prochlorperazine 10mg  IV PRN for N/V - Hgb A1C and lipid panel  - Begin Statin if LDL>70 - PT/OT consulted - SLP consulted for swallow study - Cardiac monitoring - continuous pulse ox - Neuro checks q4 hours - NPO - vestibular PT - permissive HTN up to 867 systolic per neuro  Chest Pain: resolved Patient complained of an episode of "chest pain" along entire lower chest yesterday that radiated to back. Has resolved since. Patient tender to palpation along lower chest and upper abdomen that she reported radiating to her lower back and pelvis. EKG unremarkable. Troponin neg. Abdominal ultrasound neg for AAA. - Continuous cardiac monitoring  - Continue to monitor - if becomes symptomatic will further evaluate  HTN:  Patient NPO at this time pending swallow study - hold home clonidine, hctz, amlodipine, losartan for permissive hypertension - per neuro if second MRI negative, can treat BP more aggressively and would start home regimen - normalize BP slowly if second MRI negative  Fibromyalgia:  Patient NPO at this time.  Begin home gabapentin 600mg  TID, fluoxetine 60mg  QD, PRN flexeril s/p swallow study  Multiple sclerosis -This has been quiescent for years and she denies any chronic deficit.  Stable. Reported by a Dr. Maxine Glenn in [place] many years ago  Lupus: Patient had work up last week at PCP (unable to find provider) and received a call yesterday by provider that labs indicated she had Lupus. Patient was supposed to follow up tomorrow. FH positive for sister with Lupus. Currently not being treated with anything.  FEN/GI: Saline lock, NPO Prophylaxis: SCDs   Disposition: Home pending work up and improvement  History of Present Illness:  Megan Dixon is a 52 y.o. female presenting  with dizziness, several episodes of emesis, and intermittent chest pain. Patient started to feel off balance ~1 week ago. On  Friday she began to feel more wobbly when ambulating but then became dizzy and nauseas with several episodes of vomiting today. She also describes an associated head pain localized to the back of her head that radiates down her neck and middle back that is constant. She described the dizziness as "feeling like the room is spinning" but also notes feeling wobbly and like she is "going to fall unless (she) holds onto something". She has had difficulty ambulating due to these symptoms. She notes some double vision as well. She also admits to some relief of the dizziness when she lies in certain positions and movement makes the symptoms significantly worse. Patient denies any fever, chills, weakness, facial droop, history of stroke, loss of vision, hearing changes, heart problems or heart attack. She has not had any episodes of syncope.   She notes that she began having a stutter when she speaks starting ~ 6-7 months ago. She describes it as her" tongue feels thick" and its difficult for her to find and say her words.   She has also experienced palpitations and chest pain that runs across her entire lower chest that radiates to her mid-lower back. It has been happening for a few days, intermittent, and she currently is not experiencing any chest pain.   In the ED, patient presented very symptomatic. She complained of significant dizziness and trouble standing up. This was not relieved with IV valium, reglan, ativan, or bendadryl. ED obtained MRI which found a small left cerebellar infarct that is new since previous MRI in March 2019. Labs significant for elevated hemoglobin of 15.9, Normal PT/INR, CBC, hepatic function panel, and troponin x 1. EKG NSR with some QT prolongation.   Neurology was consulted in ED and recommended CTA and complete stroke work-up.  Review Of Systems: Per HPI with the following additions:  Review of Systems  Constitutional: Negative for chills and fever.  Eyes: Positive for double  vision. Negative for blurred vision.  Respiratory: Negative for cough and shortness of breath.   Cardiovascular: Positive for chest pain and palpitations.  Gastrointestinal: Positive for nausea and vomiting. Negative for abdominal pain, constipation and diarrhea.  Musculoskeletal: Positive for back pain and neck pain.  Neurological: Positive for dizziness, speech change and headaches. Negative for focal weakness, loss of consciousness and weakness.   Patient Active Problem List   Diagnosis Date Noted  . Sepsis due to pneumonia ([place]) 08/01/2018  . MS (multiple sclerosis) ([place]) 08/01/2018  . Hypertension 08/01/2018  . Fibromyalgia 08/01/2018  . Blood-tinged sputum 08/01/2018  . Community acquired pneumonia of left lung   . Incarcerated incisional hernia 08/10/2017  . S/P exploratory laparotomy 08/09/2017   Past Medical History: Past Medical History:  Diagnosis Date  . Arthritis   . Fibromyalgia   . Hypertension   . Lupus ([place])   . MS (multiple sclerosis) ([place])   . Pneumonia 07/2018   left lung    Past Surgical History: Past Surgical History:  Procedure Laterality Date  . ABDOMINAL SURGERY    . HERNIA REPAIR    . JOINT REPLACEMENT    . VENTRAL HERNIA REPAIR N/A 08/09/2017   Procedure: Incarcerated ventral hernia repair with mesh ;  Surgeon: Rolm Bookbinder, MD;  Location: [facility];  Service: General;  Laterality: N/A;    Social History: Social History   Tobacco Use  . Smoking status: Former Smoker  Types: Cigarettes    Last attempt to quit: 05/11/2018    Years since quitting: 0.3  . Smokeless tobacco: Never Used  Substance Use Topics  . Alcohol use: Yes  . Drug use: Never   Additional social history: None Please also refer to relevant sections of EMR.  Family History: Family History  Problem Relation Age of Onset  . Lung cancer Mother    Allergies and Medications: Allergies  Allergen Reactions  . Morphine And Related Other (See Comments)    Headache  .  Other Other (See Comments)    CANNOT EAT ANYTHING WITH SEEDS (DIVERTICULITIS)  . Peanut-Containing Drug Products Other (See Comments)    Has Diverticulitis   . Toradol [Ketorolac Tromethamine] Anxiety   No current facility-administered medications on file prior to encounter.    Current Outpatient Medications on File Prior to Encounter  Medication Sig Dispense Refill  . acetaminophen (TYLENOL) 325 MG tablet Take 650 mg by mouth every 6 (six) hours as needed for moderate pain or headache.    . cloNIDine (CATAPRES) 0.1 MG tablet Take 0.1 mg by mouth at bedtime.  3  . cyclobenzaprine (FLEXERIL) 10 MG tablet Take 10 mg by mouth 3 (three) times daily as needed for muscle spasms.    Marland Kitchen FLUoxetine (PROZAC) 20 MG capsule Take 60 mg by mouth daily.    . furosemide (LASIX) 20 MG tablet Take 20 mg by mouth every morning.  0  . gabapentin (NEURONTIN) 600 MG tablet Take 600 mg by mouth 3 (three) times daily.     Marland Kitchen HYDROcodone-acetaminophen (NORCO) 5-325 MG tablet Take 1 tablet by mouth every 6 (six) hours as needed for moderate pain. (Patient taking differently: Take 1 tablet by mouth every 8 (eight) hours as needed for moderate pain. ) 30 tablet 0  . pantoprazole (PROTONIX) 40 MG tablet Take 40 mg by mouth daily.    . meclizine (ANTIVERT) 25 MG tablet Take 1 tablet (25 mg total) by mouth 3 (three) times daily as needed for dizziness. (Patient not taking: Reported on 09/18/2018) 30 tablet 0  . ondansetron (ZOFRAN ODT) 4 MG disintegrating tablet Take 1 tablet (4 mg total) by mouth every 8 (eight) hours as needed for nausea or vomiting. (Patient not taking: Reported on 09/18/2018) 20 tablet 0  . polyethylene glycol (MIRALAX) packet Mix 8 cap fulls in 32 ounces of juice/water/gatorade and drink over the course of a day. Once your stool is loose or liquid, stop taking. (Patient not taking: Reported on 09/18/2018) 14 each 0    Objective: BP (!) 178/107   Pulse 92   Temp 98.8 F (37.1 C)   Resp (!) 23   SpO2  94%  Physical Exam:  Gen: Tearful but NAD, alert, non-toxic, well-appearing, lying in bed Skin: Warm and dry. No obvious rashes, lesions, or trauma. HEENT: NCAT. PERRLA. Lateral nystagmus present. No conjunctival pallor or injection. No scleral icterus or injection.  MMM.  CV: RRR.  <2s capillary refill bilaterally.  RP & DPs 2+ bilaterally. No bilateral LE edema Resp: CTAB.  No wheezing, rales, abnormal lung sounds.  No increased WOB Abd: Very TTP in RUQ and epigastric area, Pain radiated to back. Nondistended.  Positive bowel sounds. Psych: Cooperative with exam. Pleasant. Makes eye contact. Speech normal. Extremities: Moves all extremities spontaneously  MSK: TTP along posterior neck, shoulder, and upper back bilaterally. Neuro: CN II-XII grossly intact. No FNDs. 5/5 strength right UE and LE. Slightly decreased strength in left UE and LE compared to right.  Labs and Imaging: CBC BMET  Recent Labs  Lab 09/18/18 0948  WBC 9.7  HGB 15.9*  HCT 50.4*  PLT 266   Recent Labs  Lab 09/18/18 0948  NA 140  K 4.0  CL 105  CO2 24  BUN 8  CREATININE 0.78  GLUCOSE 108*  CALCIUM 9.6     Ct Angio Head W Or Wo Contrast  Result Date: 09/18/2018 CLINICAL DATA:  Dizziness with difficulty walking. EXAM: CT ANGIOGRAPHY HEAD AND NECK TECHNIQUE: Multidetector CT imaging of the head and neck was performed using the standard protocol during bolus administration of intravenous contrast. Multiplanar CT image reconstructions and MIPs were obtained to evaluate the vascular anatomy. Carotid stenosis measurements (when applicable) are obtained utilizing NASCET criteria, using the distal internal carotid diameter as the denominator. CONTRAST:  53mL ISOVUE-370 IOPAMIDOL (ISOVUE-370) INJECTION 76% COMPARISON:  None. Brain MRI 09/18/2018. Head and neck CTA 02/11/2018. FINDINGS: CT HEAD FINDINGS Brain: There is no evidence of acute infarct, intracranial hemorrhage, mass, midline shift, or extra-axial fluid  collection. 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 The ventricles are normal in size. Vascular: Calcified atherosclerosis at the skull base. Skull: No fracture or focal osseous lesion. Sinuses: Visualized paranasal sinuses and mastoid air cells are clear. Orbits: Unremarkable. CTA NECK FINDINGS Aortic arch: Normal variant aortic arch branching pattern with common origin of the brachiocephalic and left common carotid arteries. Mild arch atherosclerosis. Widely patent arch vessel origins. Right carotid system: Patent without evidence of stenosis, dissection, or significant atherosclerosis. Left carotid system: Patent without evidence of stenosis, dissection, or significant atherosclerosis. Vertebral arteries: Patent and codominant without evidence of stenosis or dissection. Skeleton: Mild cervical spondylosis. No acute osseous abnormality or suspicious osseous lesion. Poor dentition with multiple dental caries as well as prominent periapical lucency involving the lone remaining left mandibular molar tooth. Other neck: No evidence of acute abnormality or mass. Borderline enlarged level IIa lymph nodes measuring up to 1 cm in short axis on the left, unchanged from the prior CTA. Upper chest: Similar mosaic attenuation in the left greater than right lung apices which may reflect air trapping. Review of the MIP images confirms the above findings CTA HEAD FINDINGS Anterior circulation: The internal carotid arteries are patent from skull base to carotid termini with mild atherosclerosis bilaterally not resulting in significant stenosis. ACAs and MCAs are patent without evidence of proximal branch occlusion or flow limiting proximal stenosis. Mild branch vessel irregularity is noted bilaterally. No aneurysm is identified. Posterior circulation: The intracranial vertebral arteries are widely patent to the basilar. Dominant right PICA and left AICA. Patent bilateral SCA origins. The basilar artery is  patent and mildly small in caliber diffusely without significant focal stenosis. There are left larger than right posterior communicating arteries with left P1 hypoplasia. Diffuse P2 and more distal branch vessel irregularity is more notable than on the prior CTA and is likely in part technical as well as reflecting underlying atherosclerosis. No aneurysm is identified. Venous sinuses: Patent as permitted by arterial phase contrast timing. Anatomic variants: Hypoplastic left P1 segment. Delayed phase: No abnormal enhancement. Review of the MIP images confirms the above findings IMPRESSION: 1. No emergent large vessel occlusion. 2. Widely patent cervical carotid and vertebral arteries. 3. Mild intracranial atherosclerosis without evidence of major branch occlusion or flow limiting proximal stenosis. 4.  Aortic Atherosclerosis (ICD10-I70.0). Electronically Signed   By: Logan Bores M.D.   On: 09/18/2018 18:06   Dg Chest 2 View  Result  Date: 09/18/2018 CLINICAL DATA:  Chest pain, palpitations EXAM: CHEST - 2 VIEW COMPARISON:  08/02/2018 FINDINGS: Cardiomegaly. No confluent airspace opacities or effusions. No acute bony abnormality. IMPRESSION: Cardiomegaly.  No active disease. Electronically Signed   By: Rolm Baptise M.D.   On: 09/18/2018 10:55   Ct Angio Neck W Or Wo Contrast  Result Date: 09/18/2018 CLINICAL DATA:  Dizziness with difficulty walking. EXAM: CT ANGIOGRAPHY HEAD AND NECK TECHNIQUE: Multidetector CT imaging of the head and neck was performed using the standard protocol during bolus administration of intravenous contrast. Multiplanar CT image reconstructions and MIPs were obtained to evaluate the vascular anatomy. Carotid stenosis measurements (when applicable) are obtained utilizing NASCET criteria, using the distal internal carotid diameter as the denominator. CONTRAST:  75mL ISOVUE-370 IOPAMIDOL (ISOVUE-370) INJECTION 76% COMPARISON:  None. Brain MRI 09/18/2018. Head and neck CTA 02/11/2018.  FINDINGS: CT HEAD FINDINGS Brain: There is no evidence of acute infarct, intracranial hemorrhage, mass, midline shift, or extra-axial fluid collection. 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 The ventricles are normal in size. Vascular: Calcified atherosclerosis at the skull base. Skull: No fracture or focal osseous lesion. Sinuses: Visualized paranasal sinuses and mastoid air cells are clear. Orbits: Unremarkable. CTA NECK FINDINGS Aortic arch: Normal variant aortic arch branching pattern with common origin of the brachiocephalic and left common carotid arteries. Mild arch atherosclerosis. Widely patent arch vessel origins. Right carotid system: Patent without evidence of stenosis, dissection, or significant atherosclerosis. Left carotid system: Patent without evidence of stenosis, dissection, or significant atherosclerosis. Vertebral arteries: Patent and codominant without evidence of stenosis or dissection. Skeleton: Mild cervical spondylosis. No acute osseous abnormality or suspicious osseous lesion. Poor dentition with multiple dental caries as well as prominent periapical lucency involving the lone remaining left mandibular molar tooth. Other neck: No evidence of acute abnormality or mass. Borderline enlarged level IIa lymph nodes measuring up to 1 cm in short axis on the left, unchanged from the prior CTA. Upper chest: Similar mosaic attenuation in the left greater than right lung apices which may reflect air trapping. Review of the MIP images confirms the above findings CTA HEAD FINDINGS Anterior circulation: The internal carotid arteries are patent from skull base to carotid termini with mild atherosclerosis bilaterally not resulting in significant stenosis. ACAs and MCAs are patent without evidence of proximal branch occlusion or flow limiting proximal stenosis. Mild branch vessel irregularity is noted bilaterally. No aneurysm is identified. Posterior circulation: The intracranial  vertebral arteries are widely patent to the basilar. Dominant right PICA and left AICA. Patent bilateral SCA origins. The basilar artery is patent and mildly small in caliber diffusely without significant focal stenosis. There are left larger than right posterior communicating arteries with left P1 hypoplasia. Diffuse P2 and more distal branch vessel irregularity is more notable than on the prior CTA and is likely in part technical as well as reflecting underlying atherosclerosis. No aneurysm is identified. Venous sinuses: Patent as permitted by arterial phase contrast timing. Anatomic variants: Hypoplastic left P1 segment. Delayed phase: No abnormal enhancement. Review of the MIP images confirms the above findings IMPRESSION: 1. No emergent large vessel occlusion. 2. Widely patent cervical carotid and vertebral arteries. 3. Mild intracranial atherosclerosis without evidence of major branch occlusion or flow limiting proximal stenosis. 4.  Aortic Atherosclerosis (ICD10-I70.0). Electronically Signed   By: Logan Bores M.D.   On: 09/18/2018 18:06   Ct Abdomen W Contrast  Result Date: 08/23/2018 CLINICAL DATA:  Upper abdominal pain for 2  weeks. Bloating, nausea, vomiting and diarrhea. EXAM: CT ABDOMEN WITH CONTRAST TECHNIQUE: Multidetector CT imaging of the abdomen was performed using the standard protocol following bolus administration of intravenous contrast. CONTRAST:  188mL ISOVUE-300 IOPAMIDOL (ISOVUE-300) INJECTION 61% COMPARISON:  CT scan 08/22/2017 FINDINGS: Lower chest: Mild emphysematous changes are noted. Streaky basilar atelectasis but no infiltrates or effusions. The heart is normal in size. No pericardial effusion. Hepatobiliary: No focal hepatic lesions or intrahepatic biliary dilatation. The gallbladder appears normal. No common bile duct dilatation. Pancreas: No mass, inflammation or ductal dilatation. Spleen: Normal size.  No focal lesions. Adrenals/Urinary Tract: Stable small left adrenal gland  nodule most consistent with a benign adenoma. The right adrenal gland is normal. Both kidneys demonstrate normal enhancement/perfusion. No worrisome renal lesions. No hydronephrosis. The delayed images do not demonstrate any significant collecting system abnormalities. Stomach/Bowel: The stomach, duodenum, visualized small bowel and visualize colon are unremarkable. The appendix is normal. Vascular/Lymphatic: Scattered atherosclerotic calcifications involving the aorta and iliac arteries. No aneurysm or dissection. The branch vessels are patent. The major venous structures are patent. No mesenteric or retroperitoneal mass or adenopathy. Other: Periumbilical and supraumbilical abdominal wall hernias are noted. Musculoskeletal: No significant bony findings. IMPRESSION: 1. No acute abdominal findings, mass lesions or adenopathy. 2. Periumbilical and supraumbilical abdominal wall hernias. 3. Stable small left adrenal gland nodule, likely benign adenoma. Electronically Signed   By: Marijo Sanes M.D.   On: 08/23/2018 14:30   Mr Brain Wo Contrast  Result Date: 09/18/2018 CLINICAL DATA:  52 year old female with dizziness for 2 days. Blurred vision, severe pressure in the head and chest. EXAM: MRI HEAD WITHOUT CONTRAST TECHNIQUE: Multiplanar, multiecho pulse sequences of the brain and surrounding structures were obtained without intravenous contrast. COMPARISON:  Brain MRI and CTA head and neck 02/11/2018. FINDINGS: Brain: A small chronic infarct in the left cerebellum is chronic but new since 02/11/2018 (series 7, image 7). No restricted diffusion or evidence of acute infarction. Signal in the brainstem and deep gray matter nuclei remains normal. Scattered small cerebral white matter T2 and FLAIR hyperintense foci appear stable and are in a nonspecific configuration. No cortical encephalomalacia or chronic cerebral blood products. No midline shift, mass effect, evidence of mass lesion, ventriculomegaly, extra-axial  collection or acute intracranial hemorrhage. Cervicomedullary junction and pituitary are within normal limits. Vascular: Major intracranial vascular flow voids are stable and preserved. Skull and upper cervical spine: Negative visible cervical spine. Normal bone marrow signal. Sinuses/Orbits: Stable and negative. Other: Stable adenoid hypertrophy. Mastoids remain clear. Visible internal auditory structures appear normal. Scalp and face soft tissues appear negative. IMPRESSION: 1. There is no acute infarct or acute intracranial abnormality, but a small left cerebellar infarct is new since the March 2019 MRI. 2. Stable MRI appearance the brain otherwise, with moderate for age nonspecific white matter signal changes. Electronically Signed   By: Genevie Ann M.D.   On: 09/18/2018 14:33   US Aorta Complete  Result Date: 09/18/2018 CLINICAL DATA:  Chest pain EXAM: ULTRASOUND OF ABDOMINAL AORTA TECHNIQUE: Ultrasound examination of the abdominal aorta was performed to evaluate for abdominal aortic aneurysm. COMPARISON:  None. FINDINGS: Abdominal aortic measurements as follows: Proximal:  2.2 x 2.7 cm Mid:  2.1 x 1.9 cm Distal:  1.7 x 1.7 cm Right common iliac artery: 1.1 x 0.9 cm Left common iliac artery: 1.0 x 1.2 cm Distal aorta velocity: 74 cm/sec IMPRESSION: No evidence of abdominal aortic aneurysm. Electronically Signed   By: Julian Hy M.D.   On: 09/18/2018 18:20  Mina Marble Capitan, DO 09/18/2018, 3:21 PM PGY-1, [facility] Intern pager: [phone], text pages welcome  -------------------------------------------------------- Upper Level Addendum: I have seen and evaluated this patient along with Dr. Tarry Kos and reviewed the above note, making necessary revisions in brown.  Guadalupe Dawn MD PGY-2 Family Medicine Resident

## 2018-09-18 NOTE — ED Notes (Signed)
Pt was unable to do lying orthostatic and standing was really dizzy.Notified RN

## 2018-09-18 NOTE — ED Notes (Signed)
Left for MRI.

## 2018-09-18 NOTE — ED Notes (Signed)
Spoke with Dr. Leonel Ramsay ref. Pt's blood pressure.  Medication order given

## 2018-09-19 ENCOUNTER — Inpatient Hospital Stay (HOSPITAL_COMMUNITY): Payer: Medicare Other

## 2018-09-19 ENCOUNTER — Other Ambulatory Visit (HOSPITAL_COMMUNITY): Payer: Medicare Other

## 2018-09-19 DIAGNOSIS — R42 Dizziness and giddiness: Secondary | ICD-10-CM

## 2018-09-19 DIAGNOSIS — I639 Cerebral infarction, unspecified: Secondary | ICD-10-CM

## 2018-09-19 LAB — CBC WITH DIFFERENTIAL/PLATELET
Abs Immature Granulocytes: 0 10*3/uL (ref 0.0–0.1)
Basophils Absolute: 0 10*3/uL (ref 0.0–0.1)
Basophils Relative: 0 %
Eosinophils Absolute: 0.1 10*3/uL (ref 0.0–0.7)
Eosinophils Relative: 1 %
HCT: 45.9 % (ref 36.0–46.0)
Hemoglobin: 14.5 g/dL (ref 12.0–15.0)
Immature Granulocytes: 0 %
Lymphocytes Relative: 36 %
Lymphs Abs: 3.4 10*3/uL (ref 0.7–4.0)
MCH: 28.4 pg (ref 26.0–34.0)
MCHC: 31.6 g/dL (ref 30.0–36.0)
MCV: 89.8 fL (ref 78.0–100.0)
Monocytes Absolute: 0.9 10*3/uL (ref 0.1–1.0)
Monocytes Relative: 9 %
Neutro Abs: 5.1 10*3/uL (ref 1.7–7.7)
Neutrophils Relative %: 54 %
Platelets: 200 10*3/uL (ref 150–400)
RBC: 5.11 MIL/uL (ref 3.87–5.11)
RDW: 14.6 % (ref 11.5–15.5)
WBC: 9.5 10*3/uL (ref 4.0–10.5)

## 2018-09-19 MED ORDER — HYDROCODONE-ACETAMINOPHEN 5-325 MG PO TABS
1.0000 | ORAL_TABLET | Freq: Three times a day (TID) | ORAL | Status: DC | PRN
  Administered 2018-09-19 – 2018-09-23 (×8): 1 via ORAL
  Filled 2018-09-19 (×8): qty 1

## 2018-09-19 MED ORDER — ATORVASTATIN CALCIUM 40 MG PO TABS
40.0000 mg | ORAL_TABLET | Freq: Every day | ORAL | Status: DC
  Administered 2018-09-20 – 2018-09-24 (×5): 40 mg via ORAL
  Filled 2018-09-19 (×5): qty 1

## 2018-09-19 MED ORDER — LORAZEPAM 2 MG/ML IJ SOLN
0.5000 mg | Freq: Once | INTRAMUSCULAR | Status: AC
  Administered 2018-09-19: 0.5 mg via INTRAVENOUS
  Filled 2018-09-19: qty 1

## 2018-09-19 NOTE — Progress Notes (Signed)
Family Medicine Teaching Service Daily Progress Note Intern Pager: [phone]  Patient name: Megan Dixon Medical record number: [id number] Date of birth: 04/21/1966 Age: 52 y.o. Gender: female  Primary Care Provider: System, Provider Not In Consultants: Neurology Code Status: Full code  Pt Overview and Major Events to Date:  09/18/2018 Admitted 09/18/2018: MRI w/out contrast, CTA head/neck, abdominal aortic ultrasound 09/19/2018: [facility] Day: 2   Assessment and Plan: Korri Ask is a 52 y.o. female presenting with vertigo like dizziness and intermittent chest pain. PMH is significant for HTN and recent admission for sepsis in August. Reported history of Multiple Sclerosis, although patient has not had workup.   Intractable Vertiginous Symptoms: Overnight patient notes some improvement in dizziness but still severe when she moves. Neurology following. MRI brain and Echo with bubbles pending. Will follow-up on results. If negative stroke work-up, may consider Prednisone for possible labryinthitis/post infectious vertigo. Patient notes h/o BPPV in 2009 that was treated by "PT/maneuvers". - Follow-up neuro recs - Follow up MRI brain - Follow up Echo with bubbles - Begin ASA 81mg  - Plavix 75mg    Posterior Headache: Patient has been experiencing a posterior headache that radiates down her neck to middle back that is constant for the last several days. Per Neuro, patient given migraine cocktail consisting of Flexeril, Benadryl, Compazine last night. She endorses some improvement with that. She has been getting Tylenol without relief. Lipid panel positive for chol 205, HDL 38 and LDL of 141. HgbA1C 5.8. - Flexril 10mg  TID PRN headache - Continue Benadryl 12.5mg  IV q6 PRN - Once not NPO, can switch to oral - Vestibular PT consulted, will follow-up - OT consulted, will follow-up - SLP consulted for swallow study - Cardiac monitoring - continuous pulse ox - Neuro checks q4  hours  HTN:  BP overnight: 158-218/123-126. BP this AM: 173/107. Permissive HTN per neurology pending repeat MRI. If negative, will treat BP more aggressively  - Lobetalol 10mg  for BP >220/110  - Pending MRI, will restart home Clonidine, HCTZ, amlodipine, and losartan - Consider increasing Clonidine from QD to BID.  HLD:  Lipid panel: Chol 205, Trig: 129, HDL 38, LDL 141. 10 year ASCVD risk 19.3%. Since LDL>70 will start high intensity statin.  - Start Atorvastatin 40mg   Chest Pain: resolved Patient does/does not complain of chest pain overnight.  -Will continue to monitor, if becomes symptomatic will further evaluate  Fibromyalgia:  Begin home gabapentin 600mg  TID, fluoxetine 60mg  QD, PRN flexeril  Multiple sclerosis -This has been quiescent for years and she denies any chronic deficit. Stable. Reported by a Dr. Maxine Glenn in [place] many years ago  Lupus: Patient had work up last week at PCP (unable to find provider) and received a call yesterday by provider that labs indicated she had Lupus. Patient was supposed to follow up tomorrow. FH positive for sister with Lupus. Currently not being treated with anything.  Fluids:Saline lock Electrolytes: Replace PRN Nutrition: NPO GI ppx: Protonix DVT ppx: SCDs  Future labs: None Disposition: Home pending work up and improvement  Medications: Scheduled Meds: .  stroke: mapping our early stages of recovery book   Does not apply Once  . [START ON 09/20/2018] atorvastatin  40 mg Oral q1800  . clopidogrel  75 mg Oral Daily  . enoxaparin (LOVENOX) injection  40 mg Subcutaneous Q24H  . FLUoxetine  60 mg Oral Daily  . furosemide  20 mg Oral BH-q7a  . gabapentin  600 mg Oral TID  . pantoprazole  40 mg  Oral Daily   Continuous Infusions: PRN Meds: acetaminophen **OR** acetaminophen (TYLENOL) oral liquid 160 mg/5 mL **OR** acetaminophen, cyclobenzaprine, diphenhydrAMINE, labetalol,  prochlorperazine  ================================================= ================================================= Subjective:  Patient reports doing well overnight. Pt has had good appetite and normal voids and bowel movements. She is still having a posterior head pain with radiation to her neck and back. The migraine cocktail seemed to help a little last night but this morning she has only received tylenol without relief. Her dizziness is mildly better, but becomes significantly worse if she moves. She remembered she did have a history of "dizziness back in 2009" and was told she had "stones in head" and was treated with "physical therapy and maneuvers."  Objective: Vital Signs Temp:  [97.7 F (36.5 C)-98.9 F (37.2 C)] 98 F (36.7 C) (10/07 1323) Pulse Rate:  [79-93] 93 (10/07 1323) Resp:  [18] 18 (10/07 1323) BP: (145-182)/(82-107) 145/82 (10/07 1323) SpO2:  [93 %-100 %] 100 % (10/07 1323)  Intake/Output 10/06 0701 - 10/07 0700 In: 1000 [IV Piggyback:1000] Out: -   Physical Exam:  Gen: NAD, alert, non-toxic, well-appearing, sitting comfortably, in better spirits today Skin: Warm and dry. No obvious rashes, lesions, or trauma. HEENT: NCAT, PERRLA, EOMI, No conjunctival pallor or injection. No scleral icterus or injection.  MMM. No nystagmus noted today CV: RRR. <2s capillary refill bilaterally.  RP & DPs 2+ bilaterally. No bilateral LE edema Resp: CTAB.  No wheezing, rales, abnormal lung sounds.  No increased WOB Abd: NTND on palpation to all 4 quadrants.  Positive bowel sounds. Large abdominal scar present along medial right abdomin from prior hernia surgery Psych: Cooperative with exam. Pleasant. Makes eye contact. Speech normal. Extremities: Moves all extremities spontaneously  Neuro: CN II-XII grossly intact. No FNDs. 5/5 strength on the right, 4/5 strength on the left  Laboratory: Recent Labs  Lab 09/18/18 0948 09/19/18 0439  WBC 9.7 9.5  HGB 15.9* 14.5  HCT 50.4*  45.9  PLT 266 200   Recent Labs  Lab 09/18/18 0948  NA 140  K 4.0  CL 105  CO2 24  BUN 8  CREATININE 0.78  CALCIUM 9.6  PROT 8.2*  BILITOT 0.5  ALKPHOS 99  ALT 16  AST 21  GLUCOSE 108*   Imaging/Diagnostic Tests: Ct Angio Head W Or Wo Contrast  Result Date: 09/18/2018 CLINICAL DATA:  Dizziness with difficulty walking. EXAM: CT ANGIOGRAPHY HEAD AND NECK TECHNIQUE: Multidetector CT imaging of the head and neck was performed using the standard protocol during bolus administration of intravenous contrast. Multiplanar CT image reconstructions and MIPs were obtained to evaluate the vascular anatomy. Carotid stenosis measurements (when applicable) are obtained utilizing NASCET criteria, using the distal internal carotid diameter as the denominator. CONTRAST:  71mL ISOVUE-370 IOPAMIDOL (ISOVUE-370) INJECTION 76% COMPARISON:  None. Brain MRI 09/18/2018. Head and neck CTA 02/11/2018. FINDINGS: CT HEAD FINDINGS Brain: There is no evidence of acute infarct, intracranial hemorrhage, mass, midline shift, or extra-axial fluid collection. 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 The ventricles are normal in size. Vascular: Calcified atherosclerosis at the skull base. Skull: No fracture or focal osseous lesion. Sinuses: Visualized paranasal sinuses and mastoid air cells are clear. Orbits: Unremarkable. CTA NECK FINDINGS Aortic arch: Normal variant aortic arch branching pattern with common origin of the brachiocephalic and left common carotid arteries. Mild arch atherosclerosis. Widely patent arch vessel origins. Right carotid system: Patent without evidence of stenosis, dissection, or significant atherosclerosis. Left carotid system: Patent without evidence of stenosis,  dissection, or significant atherosclerosis. Vertebral arteries: Patent and codominant without evidence of stenosis or dissection. Skeleton: Mild cervical spondylosis. No acute osseous abnormality or suspicious osseous  lesion. Poor dentition with multiple dental caries as well as prominent periapical lucency involving the lone remaining left mandibular molar tooth. Other neck: No evidence of acute abnormality or mass. Borderline enlarged level IIa lymph nodes measuring up to 1 cm in short axis on the left, unchanged from the prior CTA. Upper chest: Similar mosaic attenuation in the left greater than right lung apices which may reflect air trapping. Review of the MIP images confirms the above findings CTA HEAD FINDINGS Anterior circulation: The internal carotid arteries are patent from skull base to carotid termini with mild atherosclerosis bilaterally not resulting in significant stenosis. ACAs and MCAs are patent without evidence of proximal branch occlusion or flow limiting proximal stenosis. Mild branch vessel irregularity is noted bilaterally. No aneurysm is identified. Posterior circulation: The intracranial vertebral arteries are widely patent to the basilar. Dominant right PICA and left AICA. Patent bilateral SCA origins. The basilar artery is patent and mildly small in caliber diffusely without significant focal stenosis. There are left larger than right posterior communicating arteries with left P1 hypoplasia. Diffuse P2 and more distal branch vessel irregularity is more notable than on the prior CTA and is likely in part technical as well as reflecting underlying atherosclerosis. No aneurysm is identified. Venous sinuses: Patent as permitted by arterial phase contrast timing. Anatomic variants: Hypoplastic left P1 segment. Delayed phase: No abnormal enhancement. Review of the MIP images confirms the above findings IMPRESSION: 1. No emergent large vessel occlusion. 2. Widely patent cervical carotid and vertebral arteries. 3. Mild intracranial atherosclerosis without evidence of major branch occlusion or flow limiting proximal stenosis. 4.  Aortic Atherosclerosis (ICD10-I70.0). Electronically Signed   By: Logan Bores M.D.    On: 09/18/2018 18:06   Dg Chest 2 View  Result Date: 09/18/2018 CLINICAL DATA:  Chest pain, palpitations EXAM: CHEST - 2 VIEW COMPARISON:  08/02/2018 FINDINGS: Cardiomegaly. No confluent airspace opacities or effusions. No acute bony abnormality. IMPRESSION: Cardiomegaly.  No active disease. Electronically Signed   By: Rolm Baptise M.D.   On: 09/18/2018 10:55   Ct Angio Neck W Or Wo Contrast  Result Date: 09/18/2018 CLINICAL DATA:  Dizziness with difficulty walking. EXAM: CT ANGIOGRAPHY HEAD AND NECK TECHNIQUE: Multidetector CT imaging of the head and neck was performed using the standard protocol during bolus administration of intravenous contrast. Multiplanar CT image reconstructions and MIPs were obtained to evaluate the vascular anatomy. Carotid stenosis measurements (when applicable) are obtained utilizing NASCET criteria, using the distal internal carotid diameter as the denominator. CONTRAST:  19mL ISOVUE-370 IOPAMIDOL (ISOVUE-370) INJECTION 76% COMPARISON:  None. Brain MRI 09/18/2018. Head and neck CTA 02/11/2018. FINDINGS: CT HEAD FINDINGS Brain: There is no evidence of acute infarct, intracranial hemorrhage, mass, midline shift, or extra-axial fluid collection. 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 The ventricles are normal in size. Vascular: Calcified atherosclerosis at the skull base. Skull: No fracture or focal osseous lesion. Sinuses: Visualized paranasal sinuses and mastoid air cells are clear. Orbits: Unremarkable. CTA NECK FINDINGS Aortic arch: Normal variant aortic arch branching pattern with common origin of the brachiocephalic and left common carotid arteries. Mild arch atherosclerosis. Widely patent arch vessel origins. Right carotid system: Patent without evidence of stenosis, dissection, or significant atherosclerosis. Left carotid system: Patent without evidence of stenosis, dissection, or significant atherosclerosis. Vertebral arteries: Patent and  codominant  without evidence of stenosis or dissection. Skeleton: Mild cervical spondylosis. No acute osseous abnormality or suspicious osseous lesion. Poor dentition with multiple dental caries as well as prominent periapical lucency involving the lone remaining left mandibular molar tooth. Other neck: No evidence of acute abnormality or mass. Borderline enlarged level IIa lymph nodes measuring up to 1 cm in short axis on the left, unchanged from the prior CTA. Upper chest: Similar mosaic attenuation in the left greater than right lung apices which may reflect air trapping. Review of the MIP images confirms the above findings CTA HEAD FINDINGS Anterior circulation: The internal carotid arteries are patent from skull base to carotid termini with mild atherosclerosis bilaterally not resulting in significant stenosis. ACAs and MCAs are patent without evidence of proximal branch occlusion or flow limiting proximal stenosis. Mild branch vessel irregularity is noted bilaterally. No aneurysm is identified. Posterior circulation: The intracranial vertebral arteries are widely patent to the basilar. Dominant right PICA and left AICA. Patent bilateral SCA origins. The basilar artery is patent and mildly small in caliber diffusely without significant focal stenosis. There are left larger than right posterior communicating arteries with left P1 hypoplasia. Diffuse P2 and more distal branch vessel irregularity is more notable than on the prior CTA and is likely in part technical as well as reflecting underlying atherosclerosis. No aneurysm is identified. Venous sinuses: Patent as permitted by arterial phase contrast timing. Anatomic variants: Hypoplastic left P1 segment. Delayed phase: No abnormal enhancement. Review of the MIP images confirms the above findings IMPRESSION: 1. No emergent large vessel occlusion. 2. Widely patent cervical carotid and vertebral arteries. 3. Mild intracranial atherosclerosis without evidence of  major branch occlusion or flow limiting proximal stenosis. 4.  Aortic Atherosclerosis (ICD10-I70.0). Electronically Signed   By: Logan Bores M.D.   On: 09/18/2018 18:06   Mr Brain Wo Contrast  Result Date: 09/19/2018 CLINICAL DATA:  52 year old female with persistent dizziness, blurred vision, nystagmus. MRI yesterday negative for acute ischemia but demonstrates a small left cerebellar infarct which is new since March. Limited additional diffusion MRI as thin cuts through the brainstem and cerebellum are requested by neurology. EXAM: LIMITED MRI HEAD WITHOUT CONTRAST TECHNIQUE: Diffusion MRI of the posterior fossa without intravenous contrast. COMPARISON:  CTA head and neck and brain MRI 09/18/2018. FINDINGS: Thin slice axial and coronal 3 T diffusion weighted imaging centered on the brainstem and cerebellum reveals no restricted diffusion. Small focus of facilitated diffusion corresponding to the chronic left cerebellar infarct redemonstrated. IMPRESSION: No evidence of acute ischemia on thin slice diffusion imaging centered on the brainstem and cerebellum. Electronically Signed   By: Genevie Ann M.D.   On: 09/19/2018 12:27   Mr Brain Wo Contrast  Result Date: 09/18/2018 CLINICAL DATA:  52 year old female with dizziness for 2 days. Blurred vision, severe pressure in the head and chest. EXAM: MRI HEAD WITHOUT CONTRAST TECHNIQUE: Multiplanar, multiecho pulse sequences of the brain and surrounding structures were obtained without intravenous contrast. COMPARISON:  Brain MRI and CTA head and neck 02/11/2018. FINDINGS: Brain: A small chronic infarct in the left cerebellum is chronic but new since 02/11/2018 (series 7, image 7). No restricted diffusion or evidence of acute infarction. Signal in the brainstem and deep gray matter nuclei remains normal. Scattered small cerebral white matter T2 and FLAIR hyperintense foci appear stable and are in a nonspecific configuration. No cortical encephalomalacia or chronic  cerebral blood products. No midline shift, mass effect, evidence of mass lesion, ventriculomegaly, extra-axial collection or acute intracranial hemorrhage. Cervicomedullary  junction and pituitary are within normal limits. Vascular: Major intracranial vascular flow voids are stable and preserved. Skull and upper cervical spine: Negative visible cervical spine. Normal bone marrow signal. Sinuses/Orbits: Stable and negative. Other: Stable adenoid hypertrophy. Mastoids remain clear. Visible internal auditory structures appear normal. Scalp and face soft tissues appear negative. IMPRESSION: 1. There is no acute infarct or acute intracranial abnormality, but a small left cerebellar infarct is new since the March 2019 MRI. 2. Stable MRI appearance the brain otherwise, with moderate for age nonspecific white matter signal changes. Electronically Signed   By: Genevie Ann M.D.   On: 09/18/2018 14:33   US Aorta Complete  Result Date: 09/18/2018 CLINICAL DATA:  Chest pain EXAM: ULTRASOUND OF ABDOMINAL AORTA TECHNIQUE: Ultrasound examination of the abdominal aorta was performed to evaluate for abdominal aortic aneurysm. COMPARISON:  None. FINDINGS: Abdominal aortic measurements as follows: Proximal:  2.2 x 2.7 cm Mid:  2.1 x 1.9 cm Distal:  1.7 x 1.7 cm Right common iliac artery: 1.1 x 0.9 cm Left common iliac artery: 1.0 x 1.2 cm Distal aorta velocity: 74 cm/sec IMPRESSION: No evidence of abdominal aortic aneurysm. Electronically Signed   By: Julian Hy M.D.   On: 09/18/2018 18:20     Danna Hefty, DO 09/19/2018, 6:24 PM PGY-1, [place] Intern pager: [phone], text pages welcome

## 2018-09-19 NOTE — Evaluation (Signed)
Occupational Therapy Evaluation Patient Details Name: Megan Dixon MRN: [id number] DOB: [birth year] Today's Date: 09/19/2018    History of Present Illness 52yo female c/o impaired ambulation with 1 episode NBNB emesis, intermittent chest pain, CTA negative, negative for AAA. MD now requesting vestibular evaluation. 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 Will continue to assess as pt tolerate more OOB activity. BP sitting at EOB 161/102 and nursing made aware. Feel pt will do well once headache and dizziness symptoms improve. Pt will benefit from OT to progress ADL independence for return hopefully home. Will follow.    Follow Up Recommendations  Supervision/Assistance - 24 hour;Home health OT    Equipment Recommendations  3 in 1 bedside commode    Recommendations for Other Services       Precautions / Restrictions Precautions Precautions: Fall;Other (comment) Precaution Comments: monitor for dizziness Restrictions Weight Bearing Restrictions: No      Mobility Bed Mobility Overal bed mobility: Needs Assistance Bed Mobility: Supine to Sit;Sit to Supine     Supine to sit: Supervision Sit to supine: Supervision   General bed mobility comments: supervision for safety with dizziness and headache.  Transfers                 General transfer comment: deferred today due to headache, dizziness, and BP level.    Balance   Sitting-balance support: Feet supported;Bilateral upper extremity supported Sitting balance-Leahy Scale: Fair                                     ADL either performed or assessed with clinical judgement   ADL                                         General ADL Comments: Limited eval today as pt stating she had 10/10 headache and discomfort  from back of head into shoulders. Pt did agree to sit up at EOB with OT and stated pain at 9/10 at this time. Nursing notified of pain level and location. Pt's BP taken at EOB and was 161/102. Did assess for diplopia at EOB and pt not reporting any diplopia during EOB sitting but did continue to report headache and dizziness with continued eye movement and head turns. Encouraged pt to focus on one object during OOB transitions to help with dizziness.      Vision Patient Visual Report: Eye fatigue/eye pain/headache Vision Assessment?: Vision impaired- to be further tested in functional context Additional Comments: pt reporting diplopia during earlier PT session but none reported during OT session. Pt did continue to report headache and dizziness during eye assessment.      Perception     Praxis      Pertinent Vitals/Pain Pain Assessment: 0-10 Pain Score: 10-Worst pain ever Pain Location: head Pain Descriptors / Indicators: Aching Pain Intervention(s): Monitored during session;Repositioned;Patient requesting pain meds-RN notified     Hand Dominance     Extremity/Trunk Assessment Upper Extremity Assessment Upper Extremity Assessment: Overall WFL for tasks assessed           Communication     Cognition Arousal/Alertness: Awake/alert Behavior During Therapy: WFL for tasks assessed/performed Overall Cognitive  Status: Within Functional Limits for tasks assessed                                     General Comments       Exercises     Shoulder Instructions      Home Living                                          Prior Functioning/Environment                   OT Problem List: Decreased activity tolerance;Decreased knowledge of use of DME or AE      OT Treatment/Interventions: Self-care/ADL training;DME and/or AE instruction;Therapeutic activities;Patient/family education    OT Goals(Current goals can be found in the care plan  section) Acute Rehab OT Goals Patient Stated Goal: didnt state but agreeable to work toward increased independence with self care.  OT Goal Formulation: With patient Time For Goal Achievement: 10/03/18 Potential to Achieve Goals: Good  OT Frequency: Min 2X/week   Barriers to D/C:            Co-evaluation              AM-PAC PT "6 Clicks" Daily Activity     Outcome Measure Help from another person eating meals?: None Help from another person taking care of personal grooming?: A Little Help from another person toileting, which includes using toliet, bedpan, or urinal?: A Little Help from another person bathing (including washing, rinsing, drying)?: A Little Help from another person to put on and taking off regular upper body clothing?: A Little Help from another person to put on and taking off regular lower body clothing?: A Little 6 Click Score: 19   End of Session    Activity Tolerance: Other (comment);Patient limited by pain(dizziness and headache) Patient left: in bed;with call bell/phone within reach  OT Visit Diagnosis: Dizziness and giddiness (R42)                Time: 9390-3009 OT Time Calculation (min): 23 min Charges:  OT General Charges $OT Visit: 1 Visit OT Evaluation $OT Eval Moderate Complexity: 1 Mod OT Treatments $Therapeutic Activity: 8-22 mins    Jae Dire Raheim Beutler OTR/L Acute Rehabilitation [phone] 09/19/2018, 1:57 PM

## 2018-09-19 NOTE — Evaluation (Signed)
Speech Language Pathology Evaluation Patient Details Name: Megan Dixon MRN: [id number] DOB: 08-22-1966 Today's Date: 09/19/2018 Time: 7616-0737 SLP Time Calculation (min) (ACUTE ONLY): 15 min  Problem List:  Patient Active Problem List   Diagnosis Date Noted  . Cerebellar infarction ([place])   . Vertigo 09/18/2018  . Sepsis due to pneumonia ([place]) 08/01/2018  . MS (multiple sclerosis) ([place]) 08/01/2018  . Hypertension 08/01/2018  . Fibromyalgia 08/01/2018  . Blood-tinged sputum 08/01/2018  . Community acquired pneumonia of left lung   . Incarcerated incisional hernia 08/10/2017  . S/P exploratory laparotomy 08/09/2017   Past Medical History:  Past Medical History:  Diagnosis Date  . Arthritis   . Fibromyalgia   . Hypertension   . Lupus ([place])   . MS (multiple sclerosis) ([place])   . Pneumonia 07/2018   left lung   Past Surgical History:  Past Surgical History:  Procedure Laterality Date  . ABDOMINAL SURGERY    . HERNIA REPAIR    . JOINT REPLACEMENT    . VENTRAL HERNIA REPAIR N/A 08/09/2017   Procedure: Incarcerated ventral hernia repair with mesh ;  Surgeon: Rolm Bookbinder, MD;  Location: [facility];  Service: General;  Laterality: N/A;   HPI:  Pt is a 52 y.o. female presenting with dizziness, several episodes of emesis, and intermittent chest pain. She notes that she began having a stutter when she speaks starting ~ 6-7 months ago. She describes it as her "tongue feels thick" and it's difficult for her to find and say her words. 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 PMH is significant for HTN, PNA, lupus, HTN, fibromyalgia, arthritis, and recent admission for sepsis in August. Reported history of Multiple Sclerosis, although patient has not had workup.    Assessment / Plan / Recommendation Clinical Impression  Pt scored a 12/22 on the [facility] Blind (administered due to pt report of visual deficits; score also adjusted for education level). She reports  feeling like she has been having cognitive over the last several months, but is concerned that this could be further exacerbated at the moment. Working Marine scientist, sustained attention, verbal problem solving, and storage/retrieval of information all impacted. Pt also currently with reported 8/10 headache (had received Tylenol per her report) that could be impacting her current performance. However, given old infarct on MRI and h/o MS, she would likely benefit from SLP f/u to maximize independence and safety.      SLP Assessment  SLP Recommendation/Assessment: Patient needs continued Speech Lanaguage Pathology Services SLP Visit Diagnosis: Cognitive communication deficit (R41.841)    Follow Up Recommendations  Home health SLP;24 hour supervision/assistance    Frequency and Duration min 2x/week  2 weeks      SLP Evaluation Cognition  Overall Cognitive Status: Impaired/Different from baseline Arousal/Alertness: Awake/alert Orientation Level: Oriented X4 Attention: Sustained Sustained Attention: Impaired Sustained Attention Impairment: Verbal basic;Verbal complex Memory: Impaired Memory Impairment: Decreased recall of new information;Retrieval deficit;Storage deficit Problem Solving: Impaired Problem Solving Impairment: Verbal complex       Comprehension  Auditory Comprehension Overall Auditory Comprehension: Impaired Commands: Impaired Complex Commands: 50-74% accurate Conversation: Simple Interfering Components: Processing speed;Working Marine scientist    Expression Expression Primary Mode of Expression: Verbal Verbal Expression Overall Verbal Expression: Appears within functional limits for tasks assessed   Oral / Motor  Motor Speech Overall Motor Speech: Appears within functional limits for tasks assessed   GO  Germain Osgood 09/19/2018, 3:40 PM  Germain Osgood, M.A. [place] Acute Environmental education officer [phone] Office [phone]

## 2018-09-19 NOTE — Evaluation (Signed)
Physical Therapy Evaluation Patient Details Name: Megan Dixon MRN: [id number] DOB: 04/03/1966 Today's Date: 09/19/2018   History of Present Illness  52yo female c/o impaired ambulation with 1 episode NBNB emesis, intermittent chest pain, CTA negative, negative for AAA. MD now requesting vestibular evaluation. PMH fibromyalgia, HTN, lupus, MS, joint replacement, recent hospitalization for CAP/sepsis, old L cerebellar CVA    Clinical Impression   Patient received in chair with RN present, asking to use bathroom; able to complete functional transfers and gait with RW with Min guard, VC for focusing on target to reduce dizziness and for safety. Able to toilet with S, returned to sitting at EOB and commenced vestibular evaluation. She reports history of BPPV left ear, as well as cerebellar stroke earlier this year and also states that she has intermittent hearing loss L ear and feelings of being "pushed" sometimes but has been able to recover. Vestibular evaluation reveals severe difficulty with VOR reflexes and dysmetria, poor trajectory, poor ability to keep eyes focused on target with head movements. Dipoplia resolves with L eye covered. She does report dizziness with R Dix-Hallpike but no nystagmus noted, also reports dizziness with L Dix-Hallpike and L upbeating nystagmus noted. Currently feel that patient likely has mix of central vertigo and L ear BPPV, see flowchart below for details. Plan to start by treating central symptoms and progress to BPPV treatments as she progresses due to easily exacerbated symptoms of central origin. She was left in bed with all needs met, bed alarm active, nursing staff aware of mobility status and MD aware of outcomes of vestibular assessment.     Follow Up Recommendations Home health PT;Other (comment)(needs vestibular follow  up!!! )    Equipment Recommendations  Rolling walker with 5" wheels;3in1 (PT)    Recommendations for Other Services       Precautions  / Restrictions Precautions Precautions: Fall;Other (comment) Precaution Comments: very easily provoked dizziness  Restrictions Weight Bearing Restrictions: No      Mobility  Bed Mobility Overal bed mobility: Modified Independent             General bed mobility comments: extended time, bed rails   Transfers Overall transfer level: Needs assistance Equipment used: Rolling walker (2 wheeled) Transfers: Sit to/from [facility] Sit to Stand: Supervision Stand pivot transfers: Min guard       General transfer comment: RW, S-min guard, extended time for safety, cues for keeping eyes on target to help reduce dizziness   Ambulation/Gait Ambulation/Gait assistance: Min guard Gait Distance (Feet): 30 Feet Assistive device: Rolling walker (2 wheeled) Gait Pattern/deviations: Step-through pattern;Decreased step length - right;Decreased step length - left;Decreased stride length;Drifts right/left;Narrow base of support     General Gait Details: B LE weakness noted, occasional knee buckling and excessive UE support on RW, min guard for safety. Gait distance limited due to dizziness   Stairs            Wheelchair Mobility    Modified Rankin (Stroke Patients Only)       Balance Overall balance assessment: Needs assistance Sitting-balance support: Bilateral upper extremity supported;Feet supported Sitting balance-Leahy Scale: Fair     Standing balance support: Bilateral upper extremity supported;During functional activity Standing balance-Leahy Scale: Fair                         09/19/18 0001  Symptom Behavior  Type of Dizziness Spinning  Frequency of Dizziness with movement   Duration of Dizziness minutes  Aggravating Factors Activity in general  Relieving Factors Closing eyes (and laying on L side )  Occulomotor Exam  Occulomotor Alignment Normal  Spontaneous Comment (difficuly in identifying direction due to eye closing )   Gaze-induced Comment  Head shaking Horizontal Comment (difficult to identify, positive for dizziness )  Head Shaking Vertical Comment (difficult to identify, positive for dizziness )  Smooth Pursuits Saccades (exacerbate dizziness, dissociation noted )  Saccades Dysmetria;Poor trajectory;Slow  Vestibulo-Occular Reflex  VOR 1 Head Only (x 1 viewing) severe dysmetria, poor trajectory  VOR 2 Head and Object (x 2 viewing) unable to tolerate   VOR to Slow Head Movement Positive bilaterally  VOR Cancellation Unable to maintain gaze  Comment dysmetria, unable to maintain gaze with all VOR activities   Auditory  Comments equal bilaterally to light sounds, reports intermittent hearing loss L ear   Positional Testing  Dix-Hallpike Dix-Hallpike Right;Dix-Hallpike Left  Dix-Hallpike Right  Dix-Hallpike Right Duration minutes   Dix-Hallpike Right Symptoms Other (comment) (difficulty identifying nystagmus, dizzy )  Dix-Hallpike Left  Dix-Hallpike Left Duration approximately 10 seconds  Dix-Hallpike Left Symptoms Upbeat, left rotatory nystagmus  Cognition  Cognition Orientation Level Oriented x 4  Positional Sensitivities  Positional Sensitivities Comments senstivie to all positional changes, laying on L side improves symptoms              Pertinent Vitals/Pain Pain Assessment: No/denies pain    Home Living Family/patient expects to be discharged to:: Private residence Living Arrangements: Children Available Help at Discharge: Family;Available PRN/intermittently Type of Home: House Home Access: Stairs to enter Entrance Stairs-Rails: None Entrance Stairs-Number of Steps: 1 Home Layout: One level Home Equipment: Walker - 2 wheels;Cane - single point      Prior Function Level of Independence: Independent with assistive device(s)         Comments: uses RW now      Hand Dominance        Extremity/Trunk Assessment        Lower Extremity Assessment Lower Extremity  Assessment: Generalized weakness    Cervical / Trunk Assessment Cervical / Trunk Assessment: Kyphotic  Communication   Communication: No difficulties  Cognition Arousal/Alertness: Awake/alert Behavior During Therapy: WFL for tasks assessed/performed Overall Cognitive Status: Within Functional Limits for tasks assessed                                        General Comments      Exercises     Assessment/Plan    PT Assessment Patient needs continued PT services  PT Problem List Decreased strength;Decreased mobility;Decreased safety awareness;Decreased coordination;Decreased activity tolerance;Decreased balance       PT Treatment Interventions DME instruction;Therapeutic activities;Gait training;Therapeutic exercise;Patient/family education;Stair training;Balance training;Functional mobility training;Neuromuscular re-education;Other (comment)(vestibular interventions )    PT Goals (Current goals can be found in the Care Plan section)  Acute Rehab PT Goals Patient Stated Goal: to not be dizzy  PT Goal Formulation: With patient Time For Goal Achievement: 10/03/18 Potential to Achieve Goals: Fair    Frequency Min 4X/week   Barriers to discharge        Co-evaluation               AM-PAC PT "6 Clicks" Daily Activity  Outcome Measure Difficulty turning over in bed (including adjusting bedclothes, sheets and blankets)?: A Little Difficulty moving from lying on back to sitting on the side of the bed? : A  Little Difficulty sitting down on and standing up from a chair with arms (e.g., wheelchair, bedside commode, etc,.)?: A Little Help needed moving to and from a bed to chair (including a wheelchair)?: A Little Help needed walking in hospital room?: A Lot Help needed climbing 3-5 steps with a railing? : A Lot 6 Click Score: 16    End of Session   Activity Tolerance: Patient tolerated treatment well;Treatment limited secondary to medical complications  (Comment)(limited by dizziness ) Patient left: in bed;with bed alarm set;with call bell/phone within reach Nurse Communication: Mobility status PT Visit Diagnosis: Unsteadiness on feet (R26.81);Muscle weakness (generalized) (M62.81);Other abnormalities of gait and mobility (R26.89);Dizziness and giddiness (R42)    Time: 4827-0786 PT Time Calculation (min) (ACUTE ONLY): 44 min   Charges:   PT Evaluation $PT Eval Moderate Complexity: 1 Mod PT Treatments $Therapeutic Activity: 23-37 mins        Deniece Ree PT, DPT, CBIS  Supplemental Physical Therapist [place]    Pager [phone] Acute Rehab Office [phone]

## 2018-09-20 ENCOUNTER — Inpatient Hospital Stay (HOSPITAL_COMMUNITY): Payer: Medicare Other

## 2018-09-20 DIAGNOSIS — I5030 Unspecified diastolic (congestive) heart failure: Secondary | ICD-10-CM

## 2018-09-20 MED ORDER — CLONIDINE HCL 0.1 MG PO TABS
0.1000 mg | ORAL_TABLET | Freq: Two times a day (BID) | ORAL | Status: DC
Start: 2018-09-20 — End: 2018-09-21
  Administered 2018-09-20 – 2018-09-21 (×3): 0.1 mg via ORAL
  Filled 2018-09-20 (×3): qty 1

## 2018-09-20 NOTE — Progress Notes (Addendum)
Subjective: Complaining of left neck pain and left arm pain.  Dizziness is resolved.  Exam: Vitals:   09/19/18 2142 09/20/18 0437  BP: (!) 147/88 (!) 153/94  Pulse: 91 84  Resp:  18  Temp: 98.3 F (36.8 C) 98 F (36.7 C)  SpO2: 96% 96%    Physical Exam   HEENT-  Normocephalic, no lesions, without obvious abnormality.  Normal external eye and conjunctiva.   Cardiovascular- S1-S2 audible, pulses palpable throughout   Lungs-no rhonchi or wheezing noted, no excessive working breathing.  Saturations within normal limits Abdomen- All 4 quadrants palpated and nontender Extremities- Warm, dry and intact Musculoskeletal-no joint tenderness, deformity or swelling Skin-warm and dry, no hyperpigmentation, vitiligo, or suspicious lesions    Neuro:  Mental Status: Alert, oriented, thought content appropriate.  Speech fluent without evidence of aphasia.  Able to follow 3 step commands without difficulty. Cranial Nerves: II:  Visual fields grossly normal,  III,IV, VI: ptosis not present, extra-ocular motions intact bilaterally pupils equal, round, reactive to light and accommodation V,VII: smile symmetric, facial light touch sensation normal bilaterally VIII: hearing normal bilaterally IX,X: uvula rises midline XI: bilateral shoulder shrug XII: midline tongue extension Motor: Right : Upper extremity   5/5    Left:     Upper extremity   5/5  Lower extremity   5/5     Lower extremity   5/5 -As stated before she has a left arm tremor which is easily distractible.  She also has significant give way weakness with left bicep flexion, left shoulder abduction, left leg elevation however when distracted she has 5 out of 5 strength Sensory: Pinprick and light touch intact throughout, bilaterally Deep Tendon Reflexes: 2+ and symmetric throughout Plantars: Right: downgoing   Left: downgoing Cerebellar: normal finger-to-nose,and normal heel-to-shin test     Medications:  Scheduled: .  stroke:  mapping our early stages of recovery book   Does not apply Once  . atorvastatin  40 mg Oral q1800  . cloNIDine  0.1 mg Oral BID  . clopidogrel  75 mg Oral Daily  . enoxaparin (LOVENOX) injection  40 mg Subcutaneous Q24H  . FLUoxetine  60 mg Oral Daily  . furosemide  20 mg Oral BH-q7a  . gabapentin  600 mg Oral TID  . pantoprazole  40 mg Oral Daily     Pertinent Labs/Diagnostics: -LDL 141 - A1c 5.8 - Awaiting echocardiogram - MRI as below - CTA as below    Dg Chest 2 View  Result Date: 09/18/2018 CLINICAL DATA:  Chest pain, palpitations EXAM: CHEST - 2 VIEW COMPARISON:  08/02/2018 FINDINGS: Cardiomegaly. No confluent airspace opacities or effusions. No acute bony abnormality. IMPRESSION: Cardiomegaly.  No active disease. Electronically Signed   By: Rolm Baptise M.D.   On: 09/18/2018 10:55   IMPRESSION: 1. No emergent large vessel occlusion. 2. Widely patent cervical carotid and vertebral arteries. 3. Mild intracranial atherosclerosis without evidence of major branch occlusion or flow limiting proximal stenosis. 4.  Aortic Atherosclerosis (ICD10-I70.0). Electronically Signed   By: Logan Bores M.D.   On: 09/18/2018 18:06    Mr Brain Wo Contrast  Result Date: 09/18/2018  IMPRESSION: 1. There is no acute infarct or acute intracranial abnormality, but a small left cerebellar infarct is new since the March 2019 MRI. 2. Stable MRI appearance the brain otherwise, with moderate for age nonspecific white matter signal changes. Electronically Signed   By: Genevie Ann M.D.   On: 09/18/2018 14:33       Shanon Brow  [place] [facility] [phone]  Attending addendum Patient seen and examined Imaging reviewed personally. MRI brain does not show any acute stroke or evidence of demyelination.  Shows old left cerebellar stroke.  Assessment: 52 year old female presenting to ED with complaints of dizziness and vertigo.  Patient states she no longer has a headache but does have neck pain.   Exam is unchanged with constellation of inconsistent findings as above.  MRI shows no acute abnormalities but shows an evidence of old left cerebellar stroke for which the stroke work-up had been ordered.  An echo cardia Phillip Heal is pending at this time..  Impression:  -Vertigo -resolved -Chronic left cerebellar stroke - Arm pain (left arm)   Recommendations: - Awaiting echocardiogram -Continue Plavix for stroke prevention - High-dose statin-atorvastatin 80 mg daily as LDL is elevated -Follow-up with outpatient neurology as needed If echo is negative no further work-up per neurology and neurology will sign off Please call us with questions 09/20/2018, 8:53 AM  -- Amie Portland, MD [facility] Pager: [phone] If 7pm to 7am, please call on call as listed on AMION.   ADDENDUM Echo with EF that is lower than normal. Also has grd 1 diastolic dysfunction. No h/o cardiomyopathy, and old stroke with an embolic look can point to a cardioembolic etiology. Recommend cardiology evaluation of the cardiomyopathy (presumably new). Continue antiplatelets and statin for stroke prevention. Plan relayed to on call resident over phone.  -- Amie Portland, MD [facility] Pager: [phone] If 7pm to 7am, please call on call as listed on AMION.

## 2018-09-20 NOTE — Progress Notes (Signed)
FPTS Interim Progress Note  Spoke to patient concerning home support. She notes she lives with her daughter and grandson full time and will have the supervision she needs to be safe upon discharge.  Danna Hefty, DO 09/20/2018, 1:12 PM PGY-1, [place] Medicine Service pager [phone]

## 2018-09-20 NOTE — Progress Notes (Signed)
PT Cancellation Note  Patient Details Name: Jarelyn Bambach MRN: [id number] DOB: Sep 23, 1966   Cancelled Treatment:    Reason Eval/Treat Not Completed: Patient at procedure or test/unavailable(Pt in Echo. Will return as able.  )   Denice Paradise 09/20/2018, 10:28 AM [place] Pager:  [phone]  Office:  [phone]

## 2018-09-20 NOTE — Progress Notes (Signed)
  Echocardiogram 2D Echocardiogram has been performed.  Jennette Dubin 09/20/2018, 11:04 AM

## 2018-09-20 NOTE — Progress Notes (Signed)
Family Medicine Teaching Service Daily Progress Note Intern Pager: [phone]  Patient name: Megan Dixon Medical record number: [id number] Date of birth: 27-May-1966 Age: 52 y.o. Gender: female  Primary Care Provider: System, Provider Not In Consultants: Neurology, PT, OT, SLP Code Status: Full code  Pt Overview and Major Events to Date:  09/18/2018 Admitted 09/18/2018: MRI w/out contrast, CTA head/neck, abdominal aortic ultrasound 09/19/2018: [facility] Day: 3   Assessment and Plan: Megan Dixon is a 52 y.o. female presenting with vertigo like dizziness and intermittent chest pain. PMH is significant for HTN and recent admission for sepsis in August. Reported history of Multiple Sclerosis, although patient has not had workup.   Vertigo: Overnight patient notes improvement in dizziness. PT evaluation noted patient likely has a mix of central vertigo and left ear BPPV. OT eval limited due to severity of symptoms. They plan to follow-up. SLP recommends home health SLP due to her cognitive communication deficit. Patient has PMH BPPV. Possible sources for her central vertigo include her migraines or possible diagnosis of MS. PT/OT/SLP all recommend home health with 24 hour supervision. Will ensure patient has support at home prior to discharge - SLP consult - recommend continued home health SLP - OT consulted, will follow up, recommend home health OT - Vestibular PT following, will follow up, recommend home health PT - Home health orders placed, will ensure patient has home support prior to discharge.   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 Thin slice MRI of the brainstem and cerebellum was negative for acute ischemia. Echo with bubbles pending. Will follow-up on results. - Follow-up neuro recs  -Recommend follow-up outpatient neurology PRN - Follow up Echo with bubbles  -If Echo negative, cleared by neurology - Continue ASA 81mg  and  Plavix 75mg   - Continue Atorvastatin 40mg   - Cardiac monitoring - continuous pulse ox - Neuro checks q4 hours  Posterior Headache: Patient has been experiencing a posterior headache that radiates down her neck to middle back that is constant. Overnight she complained of this same headache unrelieved with tylenol, flexeril, benadryl, and compazine. Per patient request, her home Norco was given, finally providing patient relief. Patient notes headache this morning is significantly improved after 1 dose of Norco.  - Flexril 10mg , Benadryl PRN - Continue home Norco PRN head pain - Tylenol PRN pain - Vestibular PT following, will follow up - OT consulted, will follow-up  HTN:  BP overnight: 147-173/82-107. BP this AM: 153/94. Negative thin slice MRI, will begin more aggressive BP control - Lobetalol 10mg  for BP >220/110  - Continue home Lasix  - Restart home Clonidine, increase from QD to BID - Continue to monitor  HLD:  Lipid panel: Chol 205, Trig: 129, HDL 38, LDL 141. 10 year ASCVD risk 19.3%.  - Continue Atorvastatin 40mg   Chest Pain: resolved Patient does/does not complain of chest pain overnight.  -Will continue to monitor, if becomes symptomatic will further evaluate  Fibromyalgia:  - Continue home gabapentin 600mg  TID, fluoxetine 60mg  QD, PRN flexeril  Multiple sclerosis -This has been quiescent for years and she denies any chronic deficit. Stable. Reported by a Dr. Maxine Glenn in [place] many years ago  Lupus: Patient had work up last week at PCP (Dr. Maia Petties from [facility]) and received a call indicating she had Lupus. Patient was supposed to follow up this week. FH positive for sister with Lupus. Currently not being treated with anything. Attempted to contact PCP for further information, without  success.   Fluids:Saline lock Electrolytes: Replace PRN Nutrition: Heart healthy diet GI ppx: Protonix DVT ppx: SCDs  Future labs: None Disposition: Home  pending work up and improvement  Medications: Scheduled Meds: .  stroke: mapping our early stages of recovery book   Does not apply Once  . atorvastatin  40 mg Oral q1800  . cloNIDine  0.1 mg Oral BID  . clopidogrel  75 mg Oral Daily  . enoxaparin (LOVENOX) injection  40 mg Subcutaneous Q24H  . FLUoxetine  60 mg Oral Daily  . furosemide  20 mg Oral BH-q7a  . gabapentin  600 mg Oral TID  . pantoprazole  40 mg Oral Daily   Continuous Infusions: PRN Meds: acetaminophen **OR** acetaminophen (TYLENOL) oral liquid 160 mg/5 mL **OR** acetaminophen, cyclobenzaprine, diphenhydrAMINE, HYDROcodone-acetaminophen, labetalol  ================================================= ================================================= Subjective:  Patient reports doing well overnight. Sitting in chair at bedside comfortably. Pt has had good appetite and normal voids and bowel movements. Posterior head pain has significantly improved with her home Norco. Her dizziness is better today as well, just flares up when she moves to fast.   Objective: Vital Signs Temp:  [98 F (36.7 C)-98.3 F (36.8 C)] 98 F (36.7 C) (10/08 0437) Pulse Rate:  [84-94] 94 (10/08 1205) Resp:  [18] 18 (10/08 0437) BP: (145-156)/(82-112) 156/112 (10/08 1212) SpO2:  [96 %-100 %] 96 % (10/08 0437)  Intake/Output 10/07 0701 - 10/08 0700 In: 720 [P.O.:720] Out: -   Physical Exam:  Gen: NAD, alert, non-toxic, well-appearing, sitting comfortably, in good spirits today Skin: Warm and dry. No obvious rashes, lesions, or trauma. HEENT: NCAT, PERRLA, EOMI, No conjunctival pallor or injection. No scleral icterus or injection.  MMM. Lateral nystagmus noted today, worse on the left CV: RRR. <2s capillary refill bilaterally.  RP & DPs 2+ bilaterally. No bilateral LE edema Resp: CTAB.  No wheezing, rales, abnormal lung sounds.  No increased WOB Abd: NTND on palpation to all 4 quadrants.  Positive bowel sounds. Large abdominal scar present  along medial right abdomin from prior hernia surgery Psych: Cooperative with exam. Pleasant. Makes eye contact. Speech normal. Extremities: Moves all extremities spontaneously  Neuro: CN II-XII grossly intact. No FNDs. 5/5 strength on the right, 4/5 strength on the left  Laboratory: Recent Labs  Lab 09/18/18 0948 09/19/18 0439  WBC 9.7 9.5  HGB 15.9* 14.5  HCT 50.4* 45.9  PLT 266 200   Recent Labs  Lab 09/18/18 0948  NA 140  K 4.0  CL 105  CO2 24  BUN 8  CREATININE 0.78  CALCIUM 9.6  PROT 8.2*  BILITOT 0.5  ALKPHOS 99  ALT 16  AST 21  GLUCOSE 108*   Imaging/Diagnostic Tests: Ct Angio Head W Or Wo Contrast  Result Date: 09/18/2018 CLINICAL DATA:  Dizziness with difficulty walking. EXAM: CT ANGIOGRAPHY HEAD AND NECK TECHNIQUE: Multidetector CT imaging of the head and neck was performed using the standard protocol during bolus administration of intravenous contrast. Multiplanar CT image reconstructions and MIPs were obtained to evaluate the vascular anatomy. Carotid stenosis measurements (when applicable) are obtained utilizing NASCET criteria, using the distal internal carotid diameter as the denominator. CONTRAST:  31mL ISOVUE-370 IOPAMIDOL (ISOVUE-370) INJECTION 76% COMPARISON:  None. Brain MRI 09/18/2018. Head and neck CTA 02/11/2018. FINDINGS: CT HEAD FINDINGS Brain: There is no evidence of acute infarct, intracranial hemorrhage, mass, midline shift, or extra-axial fluid collection. 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 The ventricles are normal in size. Vascular: Calcified atherosclerosis  at the skull base. Skull: No fracture or focal osseous lesion. Sinuses: Visualized paranasal sinuses and mastoid air cells are clear. Orbits: Unremarkable. CTA NECK FINDINGS Aortic arch: Normal variant aortic arch branching pattern with common origin of the brachiocephalic and left common carotid arteries. Mild arch atherosclerosis. Widely patent arch vessel origins.  Right carotid system: Patent without evidence of stenosis, dissection, or significant atherosclerosis. Left carotid system: Patent without evidence of stenosis, dissection, or significant atherosclerosis. Vertebral arteries: Patent and codominant without evidence of stenosis or dissection. Skeleton: Mild cervical spondylosis. No acute osseous abnormality or suspicious osseous lesion. Poor dentition with multiple dental caries as well as prominent periapical lucency involving the lone remaining left mandibular molar tooth. Other neck: No evidence of acute abnormality or mass. Borderline enlarged level IIa lymph nodes measuring up to 1 cm in short axis on the left, unchanged from the prior CTA. Upper chest: Similar mosaic attenuation in the left greater than right lung apices which may reflect air trapping. Review of the MIP images confirms the above findings CTA HEAD FINDINGS Anterior circulation: The internal carotid arteries are patent from skull base to carotid termini with mild atherosclerosis bilaterally not resulting in significant stenosis. ACAs and MCAs are patent without evidence of proximal branch occlusion or flow limiting proximal stenosis. Mild branch vessel irregularity is noted bilaterally. No aneurysm is identified. Posterior circulation: The intracranial vertebral arteries are widely patent to the basilar. Dominant right PICA and left AICA. Patent bilateral SCA origins. The basilar artery is patent and mildly small in caliber diffusely without significant focal stenosis. There are left larger than right posterior communicating arteries with left P1 hypoplasia. Diffuse P2 and more distal branch vessel irregularity is more notable than on the prior CTA and is likely in part technical as well as reflecting underlying atherosclerosis. No aneurysm is identified. Venous sinuses: Patent as permitted by arterial phase contrast timing. Anatomic variants: Hypoplastic left P1 segment. Delayed phase: No abnormal  enhancement. Review of the MIP images confirms the above findings IMPRESSION: 1. No emergent large vessel occlusion. 2. Widely patent cervical carotid and vertebral arteries. 3. Mild intracranial atherosclerosis without evidence of major branch occlusion or flow limiting proximal stenosis. 4.  Aortic Atherosclerosis (ICD10-I70.0). Electronically Signed   By: Logan Bores M.D.   On: 09/18/2018 18:06   Ct Angio Neck W Or Wo Contrast  Result Date: 09/18/2018 CLINICAL DATA:  Dizziness with difficulty walking. EXAM: CT ANGIOGRAPHY HEAD AND NECK TECHNIQUE: Multidetector CT imaging of the head and neck was performed using the standard protocol during bolus administration of intravenous contrast. Multiplanar CT image reconstructions and MIPs were obtained to evaluate the vascular anatomy. Carotid stenosis measurements (when applicable) are obtained utilizing NASCET criteria, using the distal internal carotid diameter as the denominator. CONTRAST:  27mL ISOVUE-370 IOPAMIDOL (ISOVUE-370) INJECTION 76% COMPARISON:  None. Brain MRI 09/18/2018. Head and neck CTA 02/11/2018. FINDINGS: CT HEAD FINDINGS Brain: There is no evidence of acute infarct, intracranial hemorrhage, mass, midline shift, or extra-axial fluid collection. 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 The ventricles are normal in size. Vascular: Calcified atherosclerosis at the skull base. Skull: No fracture or focal osseous lesion. Sinuses: Visualized paranasal sinuses and mastoid air cells are clear. Orbits: Unremarkable. CTA NECK FINDINGS Aortic arch: Normal variant aortic arch branching pattern with common origin of the brachiocephalic and left common carotid arteries. Mild arch atherosclerosis. Widely patent arch vessel origins. Right carotid system: Patent without evidence of stenosis, dissection, or significant atherosclerosis. Left  carotid system: Patent without evidence of stenosis, dissection, or significant atherosclerosis.  Vertebral arteries: Patent and codominant without evidence of stenosis or dissection. Skeleton: Mild cervical spondylosis. No acute osseous abnormality or suspicious osseous lesion. Poor dentition with multiple dental caries as well as prominent periapical lucency involving the lone remaining left mandibular molar tooth. Other neck: No evidence of acute abnormality or mass. Borderline enlarged level IIa lymph nodes measuring up to 1 cm in short axis on the left, unchanged from the prior CTA. Upper chest: Similar mosaic attenuation in the left greater than right lung apices which may reflect air trapping. Review of the MIP images confirms the above findings CTA HEAD FINDINGS Anterior circulation: The internal carotid arteries are patent from skull base to carotid termini with mild atherosclerosis bilaterally not resulting in significant stenosis. ACAs and MCAs are patent without evidence of proximal branch occlusion or flow limiting proximal stenosis. Mild branch vessel irregularity is noted bilaterally. No aneurysm is identified. Posterior circulation: The intracranial vertebral arteries are widely patent to the basilar. Dominant right PICA and left AICA. Patent bilateral SCA origins. The basilar artery is patent and mildly small in caliber diffusely without significant focal stenosis. There are left larger than right posterior communicating arteries with left P1 hypoplasia. Diffuse P2 and more distal branch vessel irregularity is more notable than on the prior CTA and is likely in part technical as well as reflecting underlying atherosclerosis. No aneurysm is identified. Venous sinuses: Patent as permitted by arterial phase contrast timing. Anatomic variants: Hypoplastic left P1 segment. Delayed phase: No abnormal enhancement. Review of the MIP images confirms the above findings IMPRESSION: 1. No emergent large vessel occlusion. 2. Widely patent cervical carotid and vertebral arteries. 3. Mild intracranial  atherosclerosis without evidence of major branch occlusion or flow limiting proximal stenosis. 4.  Aortic Atherosclerosis (ICD10-I70.0). Electronically Signed   By: Logan Bores M.D.   On: 09/18/2018 18:06   Mr Brain Wo Contrast  Result Date: 09/19/2018 CLINICAL DATA:  52 year old female with persistent dizziness, blurred vision, nystagmus. MRI yesterday negative for acute ischemia but demonstrates a small left cerebellar infarct which is new since March. Limited additional diffusion MRI as thin cuts through the brainstem and cerebellum are requested by neurology. EXAM: LIMITED MRI HEAD WITHOUT CONTRAST TECHNIQUE: Diffusion MRI of the posterior fossa without intravenous contrast. COMPARISON:  CTA head and neck and brain MRI 09/18/2018. FINDINGS: Thin slice axial and coronal 3 T diffusion weighted imaging centered on the brainstem and cerebellum reveals no restricted diffusion. Small focus of facilitated diffusion corresponding to the chronic left cerebellar infarct redemonstrated. IMPRESSION: No evidence of acute ischemia on thin slice diffusion imaging centered on the brainstem and cerebellum. Electronically Signed   By: Genevie Ann M.D.   On: 09/19/2018 12:27   Mr Brain Wo Contrast  Result Date: 09/18/2018 CLINICAL DATA:  52 year old female with dizziness for 2 days. Blurred vision, severe pressure in the head and chest. EXAM: MRI HEAD WITHOUT CONTRAST TECHNIQUE: Multiplanar, multiecho pulse sequences of the brain and surrounding structures were obtained without intravenous contrast. COMPARISON:  Brain MRI and CTA head and neck 02/11/2018. FINDINGS: Brain: A small chronic infarct in the left cerebellum is chronic but new since 02/11/2018 (series 7, image 7). No restricted diffusion or evidence of acute infarction. Signal in the brainstem and deep gray matter nuclei remains normal. Scattered small cerebral white matter T2 and FLAIR hyperintense foci appear stable and are in a nonspecific configuration. No  cortical encephalomalacia or chronic cerebral blood products.  No midline shift, mass effect, evidence of mass lesion, ventriculomegaly, extra-axial collection or acute intracranial hemorrhage. Cervicomedullary junction and pituitary are within normal limits. Vascular: Major intracranial vascular flow voids are stable and preserved. Skull and upper cervical spine: Negative visible cervical spine. Normal bone marrow signal. Sinuses/Orbits: Stable and negative. Other: Stable adenoid hypertrophy. Mastoids remain clear. Visible internal auditory structures appear normal. Scalp and face soft tissues appear negative. IMPRESSION: 1. There is no acute infarct or acute intracranial abnormality, but a small left cerebellar infarct is new since the March 2019 MRI. 2. Stable MRI appearance the brain otherwise, with moderate for age nonspecific white matter signal changes. Electronically Signed   By: Genevie Ann M.D.   On: 09/18/2018 14:33   US Aorta Complete  Result Date: 09/18/2018 CLINICAL DATA:  Chest pain EXAM: ULTRASOUND OF ABDOMINAL AORTA TECHNIQUE: Ultrasound examination of the abdominal aorta was performed to evaluate for abdominal aortic aneurysm. COMPARISON:  None. FINDINGS: Abdominal aortic measurements as follows: Proximal:  2.2 x 2.7 cm Mid:  2.1 x 1.9 cm Distal:  1.7 x 1.7 cm Right common iliac artery: 1.1 x 0.9 cm Left common iliac artery: 1.0 x 1.2 cm Distal aorta velocity: 74 cm/sec IMPRESSION: No evidence of abdominal aortic aneurysm. Electronically Signed   By: Julian Hy M.D.   On: 09/18/2018 18:20     Danna Hefty, DO 09/20/2018, 12:34 PM PGY-1, [place] Intern pager: [phone], text pages welcome

## 2018-09-21 ENCOUNTER — Other Ambulatory Visit: Payer: Self-pay

## 2018-09-21 ENCOUNTER — Encounter (HOSPITAL_COMMUNITY): Payer: Self-pay | Admitting: Physician Assistant

## 2018-09-21 DIAGNOSIS — R5383 Other fatigue: Secondary | ICD-10-CM

## 2018-09-21 DIAGNOSIS — R002 Palpitations: Secondary | ICD-10-CM

## 2018-09-21 LAB — TSH: TSH: 0.737 u[IU]/mL (ref 0.350–4.500)

## 2018-09-21 LAB — FERRITIN: Ferritin: 45 ng/mL (ref 11–307)

## 2018-09-21 MED ORDER — AMLODIPINE BESYLATE 5 MG PO TABS
5.0000 mg | ORAL_TABLET | Freq: Every day | ORAL | Status: DC
  Administered 2018-09-21 – 2018-09-24 (×4): 5 mg via ORAL
  Filled 2018-09-21 (×4): qty 1

## 2018-09-21 MED ORDER — PROCHLORPERAZINE EDISYLATE 10 MG/2ML IJ SOLN
10.0000 mg | Freq: Four times a day (QID) | INTRAMUSCULAR | Status: DC | PRN
  Administered 2018-09-21 – 2018-09-23 (×3): 10 mg via INTRAVENOUS
  Filled 2018-09-21 (×4): qty 2

## 2018-09-21 MED ORDER — CLONIDINE HCL 0.1 MG PO TABS
0.1000 mg | ORAL_TABLET | ORAL | Status: AC
  Administered 2018-09-22 – 2018-09-24 (×2): 0.1 mg via ORAL
  Filled 2018-09-21 (×2): qty 1

## 2018-09-21 MED ORDER — CARVEDILOL 3.125 MG PO TABS
3.1250 mg | ORAL_TABLET | Freq: Two times a day (BID) | ORAL | Status: DC
  Administered 2018-09-21 – 2018-09-22 (×2): 3.125 mg via ORAL
  Filled 2018-09-21 (×2): qty 1

## 2018-09-21 NOTE — Progress Notes (Signed)
Physical Therapy Treatment Patient Details Name: Megan Dixon MRN: [id number] DOB: 14-Jul-1966 Today's Date: 09/21/2018    History of Present Illness 52yo female c/o impaired ambulation with 1 episode NBNB emesis, intermittent chest pain, CTA negative, negative for AAA. MD now requesting vestibular evaluation. 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 Pt currently with functional limitations due to balance, vestibular and endurance deficits. Pt was able to ambulate with RW with min guard assist after treatment for left BPPV.  Pt reports that her dizziness was lessened after each treatment she receives.  Will continue to address vestibular issues.  Do feel that ENT consult is warranted and text paged MD to make referral.   Pt will benefit from skilled PT to increase their independence and safety with mobility to allow discharge to the venue listed below.     Follow Up Recommendations  Home health PT;Other (comment)(needs vestibular follow  up!!! )     Equipment Recommendations  3in1 (PT)    Recommendations for Other Services       Precautions / Restrictions Precautions Precautions: Fall;Other (comment) Precaution Comments: monitor for dizziness Restrictions Weight Bearing Restrictions: No    Mobility  Bed Mobility Overal bed mobility: Needs Assistance Bed Mobility: Supine to Sit;Sit to Supine     Supine to sit: Supervision Sit to supine: Supervision   General bed mobility comments: supervision for safety with dizziness  Transfers Overall transfer level: Needs assistance Equipment used: Rolling walker (2 wheeled) Transfers: Sit to/from [facility] Sit to Stand: Supervision Stand pivot transfers: Min guard       General transfer comment: Cues for hand placement  Ambulation/Gait Ambulation/Gait assistance: Min guard Gait  Distance (Feet): 200 Feet Assistive device: Rolling walker (2 wheeled) Gait Pattern/deviations: Step-through pattern;Decreased step length - right;Decreased step length - left;Decreased stride length   Gait velocity interpretation: <1.31 ft/sec, indicative of household ambulator General Gait Details: min guard for safety. Pt using targets as needed to minimize dizziness.  Pt cannot withstand challenges to balance such as head turns as this incr dizziness.  Did not have any LOB with ambulation in controlled environment with RW.    Stairs             Wheelchair Mobility    Modified Rankin (Stroke Patients Only)       Balance Overall balance assessment: Needs assistance Sitting-balance support: Feet supported;Bilateral upper extremity supported Sitting balance-Leahy Scale: Fair     Standing balance support: During functional activity;No upper extremity supported Standing balance-Leahy Scale: Fair Standing balance comment: can stand statically for a few seconds without UE support             High level balance activites: Direction changes;Turns;Head turns High Level Balance Comments: can perform turns using targets to stabilize gaze.  Cannot perform head turns without dizziness but did not lose balance with small head turns.             Cognition Arousal/Alertness: Awake/alert Behavior During Therapy: WFL for tasks assessed/performed Overall Cognitive Status: Impaired/Different from baseline                                        Exercises Other Exercises Other Exercises: x1 exercises and saccade exercises    General Comments General comments (skin integrity, edema,  etc.): Pt positive for left BPPV and PT performed canalith repositioning for left BPPV.  Pt reports 4/10 dizziness prior to treatment and 6/10 dizziness after treatment.  Also pt with positive head thrust bilaterally.  Agree with previous therapist that pt has bilateral hypofunction,  left BPPV and right hypofunction due to the CVA.  Continued x1 exercises and saccade exercises.  Also did test for tullio phenomenon but did not note nystagmus with pt having a lot of symptoms of superior canal dehiscence.  Asked pt to f/u with ENT.  Will ask MD to refer pt to ENT for further assessment of inner ear issue.        Pertinent Vitals/Pain Pain Assessment: No/denies pain    Home Living                      Prior Function            PT Goals (current goals can now be found in the care plan section) Acute Rehab PT Goals Patient Stated Goal: didnt state but agreeable to work toward increased independence with self care.  Progress towards PT goals: Progressing toward goals    Frequency    Min 4X/week      PT Plan Current plan remains appropriate    Co-evaluation              AM-PAC PT "6 Clicks" Daily Activity  Outcome Measure  Difficulty turning over in bed (including adjusting bedclothes, sheets and blankets)?: A Little Difficulty moving from lying on back to sitting on the side of the bed? : A Little Difficulty sitting down on and standing up from a chair with arms (e.g., wheelchair, bedside commode, etc,.)?: A Little Help needed moving to and from a bed to chair (including a wheelchair)?: A Little Help needed walking in hospital room?: A Little Help needed climbing 3-5 steps with a railing? : A Lot 6 Click Score: 17    End of Session Equipment Utilized During Treatment: Gait belt Activity Tolerance: Patient tolerated treatment well;Patient limited by fatigue Patient left: with call bell/phone within reach;in chair Nurse Communication: Mobility status PT Visit Diagnosis: Unsteadiness on feet (R26.81);Muscle weakness (generalized) (M62.81);Other abnormalities of gait and mobility (R26.89);Dizziness and giddiness (R42)     Time: 3428-7681 PT Time Calculation (min) (ACUTE ONLY): 59 min  Charges:  $Gait Training: 8-22 mins $Therapeutic  Exercise: 8-22 mins $Therapeutic Activity: 8-22 mins $Canalith Rep Proc: 8-22 mins                     [place] Pager:  [phone]  Office:  [place] 09/21/2018, 10:47 AM

## 2018-09-21 NOTE — Progress Notes (Signed)
Family Medicine Teaching Service Daily Progress Note Intern Pager: [phone]  Patient name: Megan Dixon Medical record number: [id number] Date of birth: 12/10/1966 Age: 52 y.o. Gender: female  Primary Care Provider: System, Provider Not In Consultants: Neurology, PT, OT, SLP, Cardiology Code Status: Full code  Pt Overview and Major Events to Date:  09/18/2018 Admitted 09/18/2018: MRI w/out contrast, CTA head/neck, abdominal aortic ultrasound 09/19/2018: Swallow study 09/20/2018: Echo w/ bubbles Hospital Day: 4   Assessment and Plan: Tanise Russman is a 52 y.o. female presenting with vertigo like dizziness and intermittent chest pain. PMH is significant for HTN and recent admission for sepsis in August. Reported history of Multiple Sclerosis, although patient has not had workup.   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 Echo showed EF 45-50%, diffuse hypokinesis, and Grade 1 diastolic dysfunction. Per neuro, recommend cards consult - Consult cardiology - Follow-up neuro recs  -Recommend follow-up outpatient neurology PRN - Continue ASA 81mg  and Plavix 75mg   - Continue Atorvastatin 40mg   - Cardiac monitoring - continuous pulse ox - Neuro checks q4 hours  Posterior Headache: This morning patient endorsed a 10/10 posterior headache, primarily on the left with new onset blurry vision in her left eye and left facial pain upon waking. Neuro exam similar to day prior. Patient received dose of Norco just prior to exam, without relief at that time. BP overnight 140-160's/90-100's with AM BP: 162/107. Believe this may be a complex migraine since neuro exam does not appeared remarkably changed, although patient does not have a hx of migraines. Also could be an analgesic overuse headache due to chronic use of Norco and OTC extra strength tylenol. Will give migraine cocktail and continue to monitor closely. -  Migraine Cocktail: Flexril 10mg , Benadryl, Compazine - Continue home Norco PRN head pain - Tylenol PRN pain - OT consulted, will follow-up  Vertigo: Overnight patient notes improvement in dizziness. PT evaluation noted patient likely has a mix of central vertigo and left ear BPPV. She has improved with PT treatments. PT/OT/SLP all recommend home health with 24 hour supervision. Patient has good support at home with daughter and grandson. PT also recommends ENT evaluation as outpatient. Will offer ambulatory referral to ENT. - SLP consult - recommend continued home health SLP  - Per SLP, will refer to ENT for further vestibular evaluation - OT consult - recommend home health OT, 3in1 bedside commode - Vestibular PT following, will follow up, recommend home health PT - Home health orders placed  HTN:  BP overnight: 140-160's/90-100's. BP this AM: 162/107. BP still uncontrolled. Will defer changes at this time pending cardiology consult and their recommendations. - Lobetalol 10mg  for BP >220/110  - Continue home Lasix  - Continue home Clonidine BID - Continue to monitor  HLD:  Lipid panel: Chol 205, Trig: 129, HDL 38, LDL 141. 10 year ASCVD risk 19.3%.  - Continue Atorvastatin 40mg   Fibromyalgia:  - Continue home gabapentin 600mg  TID, fluoxetine 60mg  QD, PRN flexeril  Multiple sclerosis -This has been quiescent for years and she denies any chronic deficit. Stable. Reported by a Dr. Maxine Glenn in [place] many years ago  Fluids:Saline lock Electrolytes: Replace PRN Nutrition: Heart healthy diet GI ppx: Protonix DVT ppx: SCDs  Future labs: None Disposition: Home pending work up and improvement  Medications: Scheduled Meds: .  stroke: mapping our early stages of recovery book   Does not apply Once  . atorvastatin  40 mg Oral  q1800  . cloNIDine  0.1 mg Oral BID  . clopidogrel  75 mg Oral Daily  . enoxaparin (LOVENOX) injection  40 mg Subcutaneous Q24H  . FLUoxetine  60 mg  Oral Daily  . furosemide  20 mg Oral BH-q7a  . gabapentin  600 mg Oral TID  . pantoprazole  40 mg Oral Daily   Continuous Infusions: PRN Meds: acetaminophen **OR** acetaminophen (TYLENOL) oral liquid 160 mg/5 mL **OR** acetaminophen, cyclobenzaprine, diphenhydrAMINE, HYDROcodone-acetaminophen, labetalol, prochlorperazine  ================================================= ================================================= Subjective:  Patient reports doing well overnight, however she woke up with severe 10/10 posterior head pain worse on the left with new onset left eye blurry vision and left sided facial pain. She denies any history of migraines. Posterior head pain yesterday significantly improved with her home Norco. She had received a dose of norco this morning but it hadn't started helping yet. Pt has had good appetite and normal voids and bowel movements. Her dizziness is better today as well after getting several treatment with PT.  Objective: Vital Signs Temp:  [97.6 F (36.4 C)-97.8 F (36.6 C)] 97.6 F (36.4 C) (10/09 0507) Pulse Rate:  [72-86] 75 (10/09 0508) Resp:  [16-17] 16 (10/09 0507) BP: (144-164)/(87-107) 162/107 (10/09 0508) SpO2:  [97 %-100 %] 100 % (10/09 0507)  Intake/Output No intake/output data recorded.  Physical Exam:  Gen: NAD, alert, non-toxic, well-appearing, lying in bed, tearful and worried  Skin: Warm and dry. No obvious rashes, lesions, or trauma. HEENT: NCAT, PERRLA, No conjunctival pallor or injection. No scleral icterus or injection.  MMM. EOMI with lateral nystagmus noted today, worse on the left, and slightly worse on the left CV: RRR. <2s capillary refill bilaterally.  RP & DPs 2+ bilaterally. No bilateral LE edema Resp: CTAB.  No wheezing, rales, abnormal lung sounds.  No increased WOB Abd: NTND on palpation to all 4 quadrants.  Positive bowel sounds. Large abdominal scar present along medial right abdomin from prior hernia surgery Psych:  Cooperative with exam. Pleasant. Makes eye contact. Speech normal. Extremities: Moves all extremities spontaneously  Neuro: CN II-XII grossly intact. No FNDs. 5/5 strength on the right, 4/5 strength on the left. Slightly slower and less coordinated finger to nose on the left, normal finger to nose on the right. Heel to shin normal on the right. Heel to shin slower and less coordinated on the left.  Laboratory: Recent Labs  Lab 09/18/18 0948 09/19/18 0439  WBC 9.7 9.5  HGB 15.9* 14.5  HCT 50.4* 45.9  PLT 266 200   Recent Labs  Lab 09/18/18 0948  NA 140  K 4.0  CL 105  CO2 24  BUN 8  CREATININE 0.78  CALCIUM 9.6  PROT 8.2*  BILITOT 0.5  ALKPHOS 99  ALT 16  AST 21  GLUCOSE 108*   Imaging/Diagnostic Tests: No results found.  Mina Marble [place], DO 09/21/2018, 1:57 PM PGY-1, [place] Intern pager: [phone], text pages welcome

## 2018-09-21 NOTE — Care Management Important Message (Signed)
Important Message  Patient Details  Name: Megan Dixon MRN: [id number] Date of Birth: [birth year]   Medicare Important Message Given:  Yes    Jasline Buskirk 09/21/2018, 1:43 PM

## 2018-09-21 NOTE — Progress Notes (Signed)
FPTS Interim Progress Note  S: Patient notes only slight improvement in headache since this morning after the migraine cocktail and Norco. Although she did not receive benadryl.Pain is 9/10. Blurry vision has improved. Dizziness has improved and feels more steady when she ambulates.  O: Ambulating well. Exam unremarkable. BP (!) 162/107 (BP Location: Right Arm)   Pulse 75   Temp 97.6 F (36.4 C)   Resp 16   SpO2 100%    A/P: Headache may be secondary to analgesic overuse, as her normal regimen consists of Norco qAM and qHS with OTC extra strengh tylenol every afternoon. Although complex migraine is also a possibility. Patient received Norco at 6am and Flexeril at 10am. Recommended trying PRN tylenol at this time.  Will continue to follow-up.  Danna Hefty, DO 09/21/2018, 2:00 PM PGY-1, [place]  Service pager [phone]

## 2018-09-21 NOTE — Consult Note (Addendum)
Cardiology Consultation:   Patient ID: Megan Dixon; [id number]; February 11, 1966   Admit date: 09/18/2018 Date of Consult: 09/21/2018  Primary Care Provider: System, Provider Not In Primary Cardiologist: No primary care provider on file. Primary Electrophysiologist:  None  Chief Complaint: dizziness, 3 months of chest pain  Patient Profile:   Megan Dixon is a 52 y.o. female with a hx of fibromyalgia, depression, obsessive/compulsive tendencies, possible multiple sclerosis, abnormal lupus bloodwork, 40 yrs former tobacco abuse, HTN, morbid obesity, recently dx sleep apnea, longstanding palpitations who is being seen today for the evaluation of chest pain/decreased EF at the request of Dr. Tarry Kos.  History of Present Illness:   Megan Dixon has no formal cardiac hx but reports remote cath 15 years ago done for ?palpitations that was reportedly unremarkable. She was previously followed by neurology in [facility] [place] for her fibromyalgia and states she was also possibly felt to have MS although is unclear on the formal dx. She also states a month ago her PCP did labwork and found something was abnormal for possibly having lupus but further plans forthcoming. She has a constellation of symptoms and it's difficult to sort out the different chronicities and how they relate to one another. She reports longstanding hx of brief fluttering worse at night. She also has longstanding hx of sudden hot flashes followed by heart racing. She has had intermittent chest pressure for 3 months happening once a day lasting 1 minute. This can be associated with dysphagia with solid food getting stuck. It can also happen at random, or be associated with fluttering in her chest. She used to weigh 300lb but has been walking regularly twice a day and drinking more water and has gotten down to 244lb. She states when she does her walking she feels very peaceful without any cardiac symptoms. Regarding BP, home med list  includes clonidine QHS and Lasix. IM note references being on HCTZ, losartan and amlodipine as well but patient refutes this, stating she was on losartan but it was held during the recall and that's when clonidine was started. She was also recently diagnosed with OSA and was pending further instructions from PCP about therapy.   What brought her in was extreme dizziness with room spinning to the point where she could barely walk without feeling incredibly unsteady. She also has noticed a headache. She reported to neurology there were times when her tongue felt thick and speech was slurred. She also reported some blurred vision and episodes of stuttering. She also would have episodes where she would become really hot, feeling like her brain was on fire, then would start to sweat and then vomit. She was unable to comply with orthostatics due to dizziness. She had head CT/MRI which indicated a small left cerebellar infarct new since 02/2018, but no acute infarct; also showed moderate for age nonspecific white matter signal changes. CTA showed mild intracranial atherosclerosis but no flow-limiting lesions. Neurology note reports no no findings on her physical exam to support either peripheral or central vertigo, nor is there any evidence on imaging of clear ischemia or vertebral dissection. There is question of complicated migraine versus nonorganic etiology to symptoms. As part of stroke workup, 2D echo shows mild reduction in LV function (EF listed as 50% in impression, 45-50% in body), mild LVH, diffuse HK, grade 1 DD. She denies any syncope, bleeding or anything else unusual recently. She states she quit smoking last month and smoked since age 4 which seems to differ from neuro note.  Brother has h/o seizures and had "heart attack" during one. One of her grandmothers had undefined heart issue but passed at age 58.  Her palpitations have occurred in the hospital and telemetry is consistent with NSR with  intermittent PVCs. She was hypertensive to 178/107 this admission. Clonidine was increased to BID. Labs otherwise show hyperlipidemia with LDL of 141, troponin neg, CBC initially with elevated Hgb (?dehydrated), now normal, A1C 5.8, HCG neg.   Past Medical History:  Diagnosis Date  . Arthritis   . Depression   . Fibromyalgia   . Hypertension   . Lupus ([place])    a. was told bloodwork was possibly positive for lupus in 08/2018 by PCP.  Marland Kitchen Morbid obesity ([place])   . MS (multiple sclerosis) ([place])    a. presumptive dx in [place], [place]  . OCD (obsessive compulsive disorder)   . OSA (obstructive sleep apnea)    a. dx recently, pending CPAP initiation.  . Palpitations   . Pneumonia 07/2018   left lung    Past Surgical History:  Procedure Laterality Date  . ABDOMINAL SURGERY    . HERNIA REPAIR    . JOINT REPLACEMENT    . VENTRAL HERNIA REPAIR N/A 08/09/2017   Procedure: Incarcerated ventral hernia repair with mesh ;  Surgeon: Rolm Bookbinder, MD;  Location: [facility];  Service: General;  Laterality: N/A;     Inpatient Medications: Scheduled Meds: .  stroke: mapping our early stages of recovery book   Does not apply Once  . atorvastatin  40 mg Oral q1800  . cloNIDine  0.1 mg Oral BID  . clopidogrel  75 mg Oral Daily  . enoxaparin (LOVENOX) injection  40 mg Subcutaneous Q24H  . FLUoxetine  60 mg Oral Daily  . furosemide  20 mg Oral BH-q7a  . gabapentin  600 mg Oral TID  . pantoprazole  40 mg Oral Daily   Continuous Infusions:  PRN Meds: acetaminophen **OR** acetaminophen (TYLENOL) oral liquid 160 mg/5 mL **OR** acetaminophen, cyclobenzaprine, diphenhydrAMINE, HYDROcodone-acetaminophen, labetalol, prochlorperazine  Home Meds: Prior to Admission medications   Medication Sig Start Date End Date Taking? Authorizing Provider  acetaminophen (TYLENOL) 325 MG tablet Take 650 mg by mouth every 6 (six) hours as needed for moderate pain or headache.   Yes [provider]  cloNIDine  (CATAPRES) 0.1 MG tablet Take 0.1 mg by mouth at bedtime. 12/17/17  Yes [provider]  cyclobenzaprine (FLEXERIL) 10 MG tablet Take 10 mg by mouth 3 (three) times daily as needed for muscle spasms.   Yes [provider]  FLUoxetine (PROZAC) 20 MG capsule Take 60 mg by mouth daily.   Yes [provider]  furosemide (LASIX) 20 MG tablet Take 20 mg by mouth every morning. 09/07/18  Yes [provider]  gabapentin (NEURONTIN) 600 MG tablet Take 600 mg by mouth 3 (three) times daily.    Yes [provider]  HYDROcodone-acetaminophen (NORCO) 5-325 MG tablet Take 1 tablet by mouth every 6 (six) hours as needed for moderate pain. Patient taking differently: Take 1 tablet by mouth every 8 (eight) hours as needed for moderate pain.  08/13/17  Yes Kinsinger, Arta Bruce, MD  pantoprazole (PROTONIX) 40 MG tablet Take 40 mg by mouth daily.   Yes [provider]  meclizine (ANTIVERT) 25 MG tablet Take 1 tablet (25 mg total) by mouth 3 (three) times daily as needed for dizziness. Patient not taking: Reported on 09/18/2018 02/11/18   Margarita Mail, PA-C  ondansetron [facility] ODT)  4 MG disintegrating tablet Take 1 tablet (4 mg total) by mouth every 8 (eight) hours as needed for nausea or vomiting. Patient not taking: Reported on 09/18/2018 08/13/17   Kinsinger, Arta Bruce, MD  polyethylene glycol [facility]) packet Mix 8 cap fulls in 32 ounces of juice/water/gatorade and drink over the course of a day. Once your stool is loose or liquid, stop taking. Patient not taking: Reported on 09/18/2018 02/04/17   Duffy Bruce, MD    Allergies:    Allergies  Allergen Reactions  . Morphine And Related Other (See Comments)    Headache  . Other Other (See Comments)    CANNOT EAT ANYTHING WITH SEEDS (DIVERTICULITIS)  . Peanut-Containing Drug Products Other (See Comments)    Has Diverticulitis   . Toradol [Ketorolac Tromethamine] Anxiety    Social History:   Social History     Socioeconomic History  . Marital status: Single    Spouse name: Not on file  . Number of children: Not on file  . Years of education: Not on file  . Highest education level: Not on file  Occupational History  . Not on file  Social Needs  . Financial resource strain: Not on file  . Food insecurity:    Worry: Not on file    Inability: Not on file  . Transportation needs:    Medical: Not on file    Non-medical: Not on file  Tobacco Use  . Smoking status: Former Smoker    Types: Cigarettes    Last attempt to quit: 05/11/2018    Years since quitting: 0.3  . Smokeless tobacco: Never Used  . Tobacco comment: Smoked for 40 years, quit in 08/2018  Substance and Sexual Activity  . Alcohol use: Not Currently  . Drug use: Never  . Sexual activity: Not on file  Lifestyle  . Physical activity:    Days per week: Not on file    Minutes per session: Not on file  . Stress: Not on file  Relationships  . Social connections:    Talks on phone: Not on file    Gets together: Not on file    Attends religious service: Not on file    Active member of club or organization: Not on file    Attends meetings of clubs or organizations: Not on file    Relationship status: Not on file  . Intimate partner violence:    Fear of current or ex partner: Not on file    Emotionally abused: Not on file    Physically abused: Not on file    Forced sexual activity: Not on file  Other Topics Concern  . Not on file  Social History Narrative  . Not on file    Family History:   The patient's family history includes Heart attack in her brother; Lung cancer in her mother; Seizures in her brother.  ROS:  Please see the history of present illness.  All other ROS reviewed and negative.     Physical Exam/Data:   Vitals:   09/20/18 2047 09/21/18 0507 09/21/18 0508 09/21/18 1509  BP: (!) 144/87 (!) 164/107 (!) 162/107 (!) 156/94  Pulse: 86 72 75 90  Resp: 17 16  20   Temp: 97.8 F (36.6 C) 97.6 F (36.4 C)   98.7 F (37.1 C)  TempSrc:    Oral  SpO2: 97% 100%  98%   No intake or output data in the 24 hours ending 09/21/18 1637 There were no vitals filed for this visit.  There is no height or weight on file to calculate BMI.  General: Well developed, well nourished obese AAF, in no acute distress. Head: Normocephalic, atraumatic, sclera non-icteric, no xanthomas, nares are without discharge.  Neck: Negative for carotid bruits. JVD not elevated. Lungs: Clear bilaterally to auscultation without wheezes, rales, or rhonchi. Breathing is unlabored. Heart: RRR with S1 S2. No murmurs, rubs, or gallops appreciated. Abdomen: Soft, non-tender, non-distended with normoactive bowel sounds. No hepatomegaly. No rebound/guarding. No obvious abdominal masses. Msk:  Strength and tone appear normal for age. Extremities: No clubbing or cyanosis. Trace sockline edema.  Distal pedal pulses are 2+ and equal bilaterally. Neuro: Alert and oriented X 3. No facial asymmetry. No focal deficit. Moves all extremities spontaneously. Psych:  Responds to questions appropriately with a normal affect.  EKG:  The EKG was personally reviewed and demonstrates NSR with nonspecific STT changes similar to prior, QTc 449ms  Relevant CV Studies: 2D Echo yesterday Study Conclusions  - Left ventricle: The cavity size was mildly dilated. Wall   thickness was increased in a pattern of mild LVH. Systolic   function was mildly reduced. The estimated ejection fraction was   in the range of 45% to 50%. Diffuse hypokinesis. Doppler   parameters are consistent with abnormal left ventricular   relaxation (grade 1 diastolic dysfunction). Impressions: - Mild global reduction in LV systolic function (EF 50); mild LVE;   mild LVH; mild diastolic dysfunction.  Laboratory Data:  Chemistry Recent Labs  Lab 09/18/18 0948  NA 140  K 4.0  CL 105  CO2 24  GLUCOSE 108*  BUN 8  CREATININE 0.78  CALCIUM 9.6  GFRNONAA >60  GFRAA >60    ANIONGAP 11    Recent Labs  Lab 09/18/18 0948  PROT 8.2*  ALBUMIN 3.8  AST 21  ALT 16  ALKPHOS 99  BILITOT 0.5   Hematology Recent Labs  Lab 09/18/18 0948 09/19/18 0439  WBC 9.7 9.5  RBC 5.62* 5.11  HGB 15.9* 14.5  HCT 50.4* 45.9  MCV 89.7 89.8  MCH 28.3 28.4  MCHC 31.5 31.6  RDW 14.3 14.6  PLT 266 200   Cardiac EnzymesNo results for input(s): TROPONINI in the last 168 hours.  Recent Labs  Lab 09/18/18 0956  TROPIPOC 0.02    BNPNo results for input(s): BNP, PROBNP in the last 168 hours.  DDimer No results for input(s): DDIMER in the last 168 hours.  Radiology/Studies:  Ct Angio Head W Or Wo Contrast  Result Date: 09/18/2018 CLINICAL DATA:  Dizziness with difficulty walking. EXAM: CT ANGIOGRAPHY HEAD AND NECK TECHNIQUE: Multidetector CT imaging of the head and neck was performed using the standard protocol during bolus administration of intravenous contrast. Multiplanar CT image reconstructions and MIPs were obtained to evaluate the vascular anatomy. Carotid stenosis measurements (when applicable) are obtained utilizing NASCET criteria, using the distal internal carotid diameter as the denominator. CONTRAST:  19mL ISOVUE-370 IOPAMIDOL (ISOVUE-370) INJECTION 76% COMPARISON:  None. Brain MRI 09/18/2018. Head and neck CTA 02/11/2018. FINDINGS: CT HEAD FINDINGS Brain: There is no evidence of acute infarct, intracranial hemorrhage, mass, midline shift, or extra-axial fluid collection. 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 The ventricles are normal in size. Vascular: Calcified atherosclerosis at the skull base. Skull: No fracture or focal osseous lesion. Sinuses: Visualized paranasal sinuses and mastoid air cells are clear. Orbits: Unremarkable. CTA NECK FINDINGS Aortic arch: Normal variant aortic arch branching pattern with common origin of the brachiocephalic and left common carotid  arteries. Mild arch atherosclerosis. Widely patent arch vessel origins.  Right carotid system: Patent without evidence of stenosis, dissection, or significant atherosclerosis. Left carotid system: Patent without evidence of stenosis, dissection, or significant atherosclerosis. Vertebral arteries: Patent and codominant without evidence of stenosis or dissection. Skeleton: Mild cervical spondylosis. No acute osseous abnormality or suspicious osseous lesion. Poor dentition with multiple dental caries as well as prominent periapical lucency involving the lone remaining left mandibular molar tooth. Other neck: No evidence of acute abnormality or mass. Borderline enlarged level IIa lymph nodes measuring up to 1 cm in short axis on the left, unchanged from the prior CTA. Upper chest: Similar mosaic attenuation in the left greater than right lung apices which may reflect air trapping. Review of the MIP images confirms the above findings CTA HEAD FINDINGS Anterior circulation: The internal carotid arteries are patent from skull base to carotid termini with mild atherosclerosis bilaterally not resulting in significant stenosis. ACAs and MCAs are patent without evidence of proximal branch occlusion or flow limiting proximal stenosis. Mild branch vessel irregularity is noted bilaterally. No aneurysm is identified. Posterior circulation: The intracranial vertebral arteries are widely patent to the basilar. Dominant right PICA and left AICA. Patent bilateral SCA origins. The basilar artery is patent and mildly small in caliber diffusely without significant focal stenosis. There are left larger than right posterior communicating arteries with left P1 hypoplasia. Diffuse P2 and more distal branch vessel irregularity is more notable than on the prior CTA and is likely in part technical as well as reflecting underlying atherosclerosis. No aneurysm is identified. Venous sinuses: Patent as permitted by arterial phase contrast timing. Anatomic variants: Hypoplastic left P1 segment. Delayed phase: No abnormal  enhancement. Review of the MIP images confirms the above findings IMPRESSION: 1. No emergent large vessel occlusion. 2. Widely patent cervical carotid and vertebral arteries. 3. Mild intracranial atherosclerosis without evidence of major branch occlusion or flow limiting proximal stenosis. 4.  Aortic Atherosclerosis (ICD10-I70.0). Electronically Signed   By: Logan Bores M.D.   On: 09/18/2018 18:06   Dg Chest 2 View  Result Date: 09/18/2018 CLINICAL DATA:  Chest pain, palpitations EXAM: CHEST - 2 VIEW COMPARISON:  08/02/2018 FINDINGS: Cardiomegaly. No confluent airspace opacities or effusions. No acute bony abnormality. IMPRESSION: Cardiomegaly.  No active disease. Electronically Signed   By: Rolm Baptise M.D.   On: 09/18/2018 10:55   Ct Angio Neck W Or Wo Contrast  Result Date: 09/18/2018 CLINICAL DATA:  Dizziness with difficulty walking. EXAM: CT ANGIOGRAPHY HEAD AND NECK TECHNIQUE: Multidetector CT imaging of the head and neck was performed using the standard protocol during bolus administration of intravenous contrast. Multiplanar CT image reconstructions and MIPs were obtained to evaluate the vascular anatomy. Carotid stenosis measurements (when applicable) are obtained utilizing NASCET criteria, using the distal internal carotid diameter as the denominator. CONTRAST:  12mL ISOVUE-370 IOPAMIDOL (ISOVUE-370) INJECTION 76% COMPARISON:  None. Brain MRI 09/18/2018. Head and neck CTA 02/11/2018. FINDINGS: CT HEAD FINDINGS Brain: There is no evidence of acute infarct, intracranial hemorrhage, mass, midline shift, or extra-axial fluid collection. 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 The ventricles are normal in size. Vascular: Calcified atherosclerosis at the skull base. Skull: No fracture or focal osseous lesion. Sinuses: Visualized paranasal sinuses and mastoid air cells are clear. Orbits: Unremarkable. CTA NECK FINDINGS Aortic arch: Normal variant aortic arch branching pattern  with common origin of the brachiocephalic and left common carotid arteries. Mild arch atherosclerosis. Widely patent arch vessel origins.  Right carotid system: Patent without evidence of stenosis, dissection, or significant atherosclerosis. Left carotid system: Patent without evidence of stenosis, dissection, or significant atherosclerosis. Vertebral arteries: Patent and codominant without evidence of stenosis or dissection. Skeleton: Mild cervical spondylosis. No acute osseous abnormality or suspicious osseous lesion. Poor dentition with multiple dental caries as well as prominent periapical lucency involving the lone remaining left mandibular molar tooth. Other neck: No evidence of acute abnormality or mass. Borderline enlarged level IIa lymph nodes measuring up to 1 cm in short axis on the left, unchanged from the prior CTA. Upper chest: Similar mosaic attenuation in the left greater than right lung apices which may reflect air trapping. Review of the MIP images confirms the above findings CTA HEAD FINDINGS Anterior circulation: The internal carotid arteries are patent from skull base to carotid termini with mild atherosclerosis bilaterally not resulting in significant stenosis. ACAs and MCAs are patent without evidence of proximal branch occlusion or flow limiting proximal stenosis. Mild branch vessel irregularity is noted bilaterally. No aneurysm is identified. Posterior circulation: The intracranial vertebral arteries are widely patent to the basilar. Dominant right PICA and left AICA. Patent bilateral SCA origins. The basilar artery is patent and mildly small in caliber diffusely without significant focal stenosis. There are left larger than right posterior communicating arteries with left P1 hypoplasia. Diffuse P2 and more distal branch vessel irregularity is more notable than on the prior CTA and is likely in part technical as well as reflecting underlying atherosclerosis. No aneurysm is identified. Venous  sinuses: Patent as permitted by arterial phase contrast timing. Anatomic variants: Hypoplastic left P1 segment. Delayed phase: No abnormal enhancement. Review of the MIP images confirms the above findings IMPRESSION: 1. No emergent large vessel occlusion. 2. Widely patent cervical carotid and vertebral arteries. 3. Mild intracranial atherosclerosis without evidence of major branch occlusion or flow limiting proximal stenosis. 4.  Aortic Atherosclerosis (ICD10-I70.0). Electronically Signed   By: Logan Bores M.D.   On: 09/18/2018 18:06   Mr Brain Wo Contrast  Result Date: 09/19/2018 CLINICAL DATA:  52 year old female with persistent dizziness, blurred vision, nystagmus. MRI yesterday negative for acute ischemia but demonstrates a small left cerebellar infarct which is new since March. Limited additional diffusion MRI as thin cuts through the brainstem and cerebellum are requested by neurology. EXAM: LIMITED MRI HEAD WITHOUT CONTRAST TECHNIQUE: Diffusion MRI of the posterior fossa without intravenous contrast. COMPARISON:  CTA head and neck and brain MRI 09/18/2018. FINDINGS: Thin slice axial and coronal 3 T diffusion weighted imaging centered on the brainstem and cerebellum reveals no restricted diffusion. Small focus of facilitated diffusion corresponding to the chronic left cerebellar infarct redemonstrated. IMPRESSION: No evidence of acute ischemia on thin slice diffusion imaging centered on the brainstem and cerebellum. Electronically Signed   By: Genevie Ann M.D.   On: 09/19/2018 12:27   Mr Brain Wo Contrast  Result Date: 09/18/2018 CLINICAL DATA:  52 year old female with dizziness for 2 days. Blurred vision, severe pressure in the head and chest. EXAM: MRI HEAD WITHOUT CONTRAST TECHNIQUE: Multiplanar, multiecho pulse sequences of the brain and surrounding structures were obtained without intravenous contrast. COMPARISON:  Brain MRI and CTA head and neck 02/11/2018. FINDINGS: Brain: A small chronic infarct  in the left cerebellum is chronic but new since 02/11/2018 (series 7, image 7). No restricted diffusion or evidence of acute infarction. Signal in the brainstem and deep gray matter nuclei remains normal. Scattered small cerebral white matter T2 and FLAIR hyperintense foci appear stable and are  in a nonspecific configuration. No cortical encephalomalacia or chronic cerebral blood products. No midline shift, mass effect, evidence of mass lesion, ventriculomegaly, extra-axial collection or acute intracranial hemorrhage. Cervicomedullary junction and pituitary are within normal limits. Vascular: Major intracranial vascular flow voids are stable and preserved. Skull and upper cervical spine: Negative visible cervical spine. Normal bone marrow signal. Sinuses/Orbits: Stable and negative. Other: Stable adenoid hypertrophy. Mastoids remain clear. Visible internal auditory structures appear normal. Scalp and face soft tissues appear negative. IMPRESSION: 1. There is no acute infarct or acute intracranial abnormality, but a small left cerebellar infarct is new since the March 2019 MRI. 2. Stable MRI appearance the brain otherwise, with moderate for age nonspecific white matter signal changes. Electronically Signed   By: Genevie Ann M.D.   On: 09/18/2018 14:33   US Aorta Complete  Result Date: 09/18/2018 CLINICAL DATA:  Chest pain EXAM: ULTRASOUND OF ABDOMINAL AORTA TECHNIQUE: Ultrasound examination of the abdominal aorta was performed to evaluate for abdominal aortic aneurysm. COMPARISON:  None. FINDINGS: Abdominal aortic measurements as follows: Proximal:  2.2 x 2.7 cm Mid:  2.1 x 1.9 cm Distal:  1.7 x 1.7 cm Right common iliac artery: 1.1 x 0.9 cm Left common iliac artery: 1.0 x 1.2 cm Distal aorta velocity: 74 cm/sec IMPRESSION: No evidence of abdominal aortic aneurysm. Electronically Signed   By: Julian Hy M.D.   On: 09/18/2018 18:20    Assessment and Plan:   1. Extreme dizziness, also with headache,  fatigue, intermittent flushing - possibly felt to represent vertigo versus migraine in notes thus far. She was also recently diagnosed with OSA which is likely contributing to her fatigue. There are numerous possible etiologies for her headache including rebound due to habitual home OTC analgesics as well as clonidine which was initiated per patient after the losartan recall. Would advise trying to wean off clonidine in favor of better tolerated agents. Incidence of drowsiness is up to 38% of patients with clonidine and headache in up to 29%. See #3 regarding med titration. Will also defer to IM regarding possible underlying hormonal/endocrine pathology leading to her symptoms.  2. 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 They raise question of whether there could be cardioembolic contribution given decreased EF. Continue telemetry monitoring. Her degree of cardiomyopathy does not seem compelling enough to have caused embolic event. We will defer to neuro whether they would suggest TEE although not sure this would be helpful as event was not acute. Event monitoring is reasonable as outpatient. Please contact our team when patient is being discharged to arrange.   3. Atypical chest discomfort with new mild cardiomyopathy and grade 1 DD - most of her features are atypical and she actually feels better overall while walking twice a day, which argues against ischemic etiology. LV dysfunction is mild. She does have cardiac risk factors. I discussed ischemic testing with Dr. Harrell Gave who favors med titration and following her clinically before committing to any specific further testing. 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 There are no focal wall motion abnormalities. We will add carvedilol at low dose for palpitations/HTN as well as amlodipine in case of component of spasm. Patient remains concerned about the ARB cancer  recall but is no longer on this presently. We will wean clonidine - got BID yesterday, received this AM - will give a dose tomorrow AM, then again on 10/12, then stop.  4. Palpitations with PVCs documented on telemetry -  follow with addition of beta blocker. Check TSH. K and Mg are OK.  5. HTN - follow with med titration. Check thyroid.   For questions or updates, please contact [facility] Please consult [email or web address] for contact info under Cardiology/STEMI.    Signed, Charlie Pitter, PA-C  09/21/2018 4:37 PM

## 2018-09-21 NOTE — Progress Notes (Signed)
Occupational Therapy Treatment Patient Details Name: Megan Dixon MRN: [id number] DOB: Dec 08, 1966 Today's Date: 09/21/2018    History of present illness 52yo female c/o impaired ambulation with 1 episode NBNB emesis, intermittent chest pain, CTA negative, negative for AAA. MD now requesting vestibular evaluation. 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 Pt requiring supervision for safety.   Follow Up Recommendations  Home health OT    Equipment Recommendations  3 in 1 bedside commode    Recommendations for Other Services      Precautions / Restrictions Precautions Precautions: Fall Precaution Comments: monitor for dizziness Restrictions Weight Bearing Restrictions: No       Mobility Bed Mobility Overal bed mobility: Needs Assistance Bed Mobility: Sit to Supine      Sit to supine: Supervision   General bed mobility comments: supervision for safety with dizziness  Transfers Overall transfer level: Needs assistance Equipment used: Rolling walker (2 wheeled) Transfers: Sit to/from [facility] Sit to Stand: Supervision Stand pivot transfers: Min guard       General transfer comment: Cues for hand placement    Balance Overall balance assessment: Needs assistance Sitting-balance support: Feet supported;Bilateral upper extremity supported Sitting balance-Leahy Scale: Fair     Standing balance support: During functional activity;No upper extremity supported Standing balance-Leahy Scale: Fair Standing balance comment: can stand to manage clothing and stand at sink with supervision                        ADL either performed or assessed with clinical judgement   ADL Overall ADL's : Needs assistance/impaired Eating/Feeding: Independent;Bed level   Grooming: Wash/dry  hands;Standing;Supervision/safety           Upper Body Dressing : Set up;Sitting   Lower Body Dressing: Supervision/safety;Sit to/from stand Lower Body Dressing Details (indicate cue type and reason): recommended pt bring foot to her instead of leaning over to don socks Toilet Transfer: Supervision/safety;Requires wide/bariatric;Ambulation;Regular Toilet   Toileting- Water quality scientist and Hygiene: Supervision/safety;Sit to/from stand       Functional mobility during ADLs: Supervision/safety;Rolling walker General ADL Comments: Cognition intact this visit.     Vision       Perception     Praxis      Cognition Arousal/Alertness: Awake/alert Behavior During Therapy: WFL for tasks assessed/performed Overall Cognitive Status: Within Functional Limits for tasks assessed                                          Exercises    Shoulder Instructions       General Comments     Pertinent Vitals/ Pain       Pain Assessment: No/denies pain  Home Living                                          Prior Functioning/Environment              Frequency  Min 2X/week        Progress Toward Goals  OT Goals(current goals can now be found in the care plan section)  Progress towards OT goals: Progressing toward goals  Acute Rehab OT Goals  Patient Stated Goal: didnt state but agreeable to work toward increased independence with self care.  OT Goal Formulation: With patient Time For Goal Achievement: 10/03/18 Potential to Achieve Goals: Good  Plan Discharge plan remains appropriate    Co-evaluation                 AM-PAC PT "6 Clicks" Daily Activity     Outcome Measure   Help from another person eating meals?: None Help from another person taking care of personal grooming?: A Little Help from another person toileting, which includes using toliet, bedpan, or urinal?: A Little Help from another person bathing (including  washing, rinsing, drying)?: A Little Help from another person to put on and taking off regular upper body clothing?: None Help from another person to put on and taking off regular lower body clothing?: A Little 6 Click Score: 20    End of Session    OT Visit Diagnosis: Dizziness and giddiness (R42)   Activity Tolerance Patient tolerated treatment well   Patient Left in bed;with call bell/phone within reach   Nurse Communication          Time: 0258-5277 OT Time Calculation (min): 33 min  Charges: OT General Charges $OT Visit: 1 Visit OT Treatments $Self Care/Home Management : 23-37 mins  Nestor Lewandowsky, OTR/L Acute Rehabilitation Services Pager: [phone] Office: [phone]   Malka So 09/21/2018, 1:28 PM

## 2018-09-22 DIAGNOSIS — I10 Essential (primary) hypertension: Secondary | ICD-10-CM

## 2018-09-22 DIAGNOSIS — I5189 Other ill-defined heart diseases: Secondary | ICD-10-CM

## 2018-09-22 MED ORDER — CARVEDILOL 6.25 MG PO TABS
6.2500 mg | ORAL_TABLET | Freq: Two times a day (BID) | ORAL | Status: DC
  Administered 2018-09-22 – 2018-09-24 (×4): 6.25 mg via ORAL
  Filled 2018-09-22 (×4): qty 1

## 2018-09-22 NOTE — Progress Notes (Signed)
Physical Therapy Treatment Patient Details Name: Megan Dixon MRN: [id number] DOB: 14-Nov-1966 Today's Date: 09/22/2018    History of Present Illness 52yo female c/o impaired ambulation with 1 episode NBNB emesis, intermittent chest pain, CTA negative, negative for AAA. MD now requesting vestibular evaluation. 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 Pt currently with functional limitations due to balance and endurance deficits as well as vertigo.  Pt continues to progress ambulation. Dizziness improving as well.   Will continue to follow acutely.   Pt will benefit from skilled PT to increase their independence and safety with mobility to allow discharge to the venue listed below.     Follow Up Recommendations  Home health PT;Other (comment)(needs vestibular follow  up!!! )     Equipment Recommendations  3in1 (PT)    Recommendations for Other Services       Precautions / Restrictions Precautions Precautions: Fall Precaution Comments: monitor for dizziness Restrictions Weight Bearing Restrictions: No    Mobility  Bed Mobility Overal bed mobility: Needs Assistance Bed Mobility: Supine to Sit;Sit to Supine     Supine to sit: Supervision Sit to supine: Supervision   General bed mobility comments: supervision for safety  Transfers Overall transfer level: Needs assistance Equipment used: Rolling walker (2 wheeled) Transfers: Sit to/from [facility] Sit to Stand: Supervision Stand pivot transfers: Min guard       General transfer comment: Cues for hand placement  Ambulation/Gait Ambulation/Gait assistance: Min guard Gait Distance (Feet): 450 Feet Assistive device: Rolling walker (2 wheeled) Gait Pattern/deviations: Step-through pattern;Decreased step length - right;Decreased step length - left;Decreased stride length   Gait  velocity interpretation: <1.31 ft/sec, indicative of household ambulator General Gait Details: min guard for safety. Pt using targets as needed to minimize dizziness.  Pt cannot withstand challenges to balance such as head turns as this incr dizziness.  Did not have any LOB with ambulation in controlled environment with RW.    Stairs             Wheelchair Mobility    Modified Rankin (Stroke Patients Only)       Balance Overall balance assessment: Needs assistance Sitting-balance support: Feet supported;Bilateral upper extremity supported Sitting balance-Leahy Scale: Fair     Standing balance support: During functional activity;No upper extremity supported Standing balance-Leahy Scale: Fair               High level balance activites: Direction changes;Turns;Sudden stops;Head turns High Level Balance Comments: can perform turns using targets to stabilize gaze.  Cannot perform head turns without dizziness but did not lose balance with small head turns.             Cognition Arousal/Alertness: Awake/alert Behavior During Therapy: WFL for tasks assessed/performed Overall Cognitive Status: Within Functional Limits for tasks assessed                                        Exercises Other Exercises Other Exercises: x1 exercises and saccade exercises reviewed.  Pt can perform x1 exercises x 20-30 seconds each attempt.     General Comments        Pertinent Vitals/Pain Pain Assessment: No/denies pain    Home Living  Prior Function            PT Goals (current goals can now be found in the care plan section) Acute Rehab PT Goals Patient Stated Goal: didnt state but agreeable to work toward increased independence with self care.  Progress towards PT goals: Progressing toward goals    Frequency    Min 4X/week      PT Plan Current plan remains appropriate    Co-evaluation              AM-PAC PT "6  Clicks" Daily Activity  Outcome Measure  Difficulty turning over in bed (including adjusting bedclothes, sheets and blankets)?: A Little Difficulty moving from lying on back to sitting on the side of the bed? : A Little Difficulty sitting down on and standing up from a chair with arms (e.g., wheelchair, bedside commode, etc,.)?: A Little Help needed moving to and from a bed to chair (including a wheelchair)?: A Little Help needed walking in hospital room?: A Little Help needed climbing 3-5 steps with a railing? : A Lot 6 Click Score: 17    End of Session Equipment Utilized During Treatment: Gait belt Activity Tolerance: Patient tolerated treatment well;Patient limited by fatigue Patient left: with call bell/phone within reach;in chair Nurse Communication: Mobility status PT Visit Diagnosis: Unsteadiness on feet (R26.81);Muscle weakness (generalized) (M62.81);Other abnormalities of gait and mobility (R26.89);Dizziness and giddiness (R42)     Time: 0940-1009 PT Time Calculation (min) (ACUTE ONLY): 29 min  Charges:  $Gait Training: 8-22 mins $Therapeutic Exercise: 8-22 mins                     [place] Pager:  [phone]  Office:  [place] 09/22/2018, 10:10 AM

## 2018-09-22 NOTE — Progress Notes (Signed)
  Speech Language Pathology Treatment: Cognitive-Linquistic  Patient Details Name: Megan Dixon MRN: [id number] DOB: [birth year] Today's Date: 09/22/2018 Time: 1500-1530 SLP Time Calculation (min) (ACUTE ONLY): 30 min  Assessment / Plan / Recommendation Clinical Impression  Pt seen for follow up after cognitive linguistic evaluation completed 09/19/18. Pt was able to recall specifics of daily events, discuss recipe ingredients and preparation steps for several meals. Pt reports she feels she has returned to her cognitive baseline, and that medication was the cause of her confusion and change in mentation. Pt anticipates DC tomorrow, and was encouraged to notify PCP if difficulties arise once she returns to her normal routines. ST will continue to follow to address higher level cognition if not DC'd tomorrow.    HPI HPI: Pt is a 52 y.o. female presenting with dizziness, several episodes of emesis, and intermittent chest pain. She notes that she began having a stutter when she speaks starting ~ 6-7 months ago. She describes it as her "tongue feels thick" and it's difficult for her to find and say her words. 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 PMH is significant for HTN, PNA, lupus, HTN, fibromyalgia, arthritis, and recent admission for sepsis in August. Reported history of Multiple Sclerosis, although patient has not had workup.       SLP Plan  Continue with current plan of care       Recommendations   home health or outpatient ST if needs arise.                Follow up Recommendations: (home health or outpatient ST if needs arise after return to normal routines) SLP Visit Diagnosis: Cognitive communication deficit (R41.841) Plan: Continue with current plan of care       [place]. Quentin [facility], CCC-SLP Speech Language Pathologist [phone]  Shonna Chock 09/22/2018, 3:33 PM

## 2018-09-22 NOTE — Discharge Summary (Signed)
[facility] Discharge Summary  Patient name: Megan Dixon Medical record number: [id number] Date of birth: November 21, 1966 Age: 52 y.o. Gender: female Date of Admission: 09/18/2018  Date of Discharge: 09/24/2018 Admitting Physician: Lind Covert, MD  Primary Care Provider: System, Provider Not In Consultants: Neurology, Cardiology  Indication for Hospitalization: Dizziness and Intermittent chest pain  Discharge Diagnoses/Problem List:  Patient Active Problem List   Diagnosis Date Noted  . Morbid obesity ([place])   . Cerebellar infarction ([place])   . Vertigo 09/18/2018  . MS (multiple sclerosis) ([place]) 08/01/2018  . Hypertension 08/01/2018  . Fibromyalgia 08/01/2018   Disposition: Home  Discharge Condition: Stable  Discharge Exam:  Physical Exam:  Gen: NAD, alert, non-toxic, well-appearing, sitting comfortably in bed Skin: Warm and dry. No obvious rashes, lesions, or trauma. HEENT: NCAT, PERRLA, No conjunctival pallor or injection. No scleral icterus or injection.  MMM.  CV: RRR. <2s capillary refill bilaterally.  RP & DPs 2+ bilaterally. No bilateral LE edema Resp: CTAB.  No wheezing, rales, abnormal lung sounds.  No increased WOB Abd: NTND on palpation to all 4 quadrants.  Positive bowel sounds. Large abdominal scar present along medial right abdomin from prior hernia surgery Psych: Cooperative with exam. Pleasant. Makes eye contact. Speech slurred at times, chronic for several months Extremities: Moves all extremities spontaneously  Neuro: CN II-XII grossly intact. No FNDs. 5/5 strength on the right, 4/5 strength on the left. Finger to nose normal on right, slightly less coordinated on the left. Heel to shin normal on the right. Heel to shin slower and less coordinated on the left, may be 2/2 to hx of previous total knee replacement   Brief Hospital Course:  Megan Dixon is a 52 y.o. female with past medical history significant for HTN, recent  admission for sepsis 2/2 to pneumonia in August, and possible diagnosis of lupus and multple schlerosis, who presented with debilitating dizziness and intermittent chest pain and found to have BPPV and an old small left cerebellar infarct that was new since March 2019.  Initial work up was significant for negative troponins, negative chest x-ray, and labs WNL. Neurology was consulted. CTA head and neck was negative for occlusion of carotids or vertebral arteries with only mild intracranial atherosclerosis. Abdominal aortic ultrasound negative for AAA. MRI noted above findings of an old small left cerebellar infarct that was new since MRI in March 2019. Thin slice MRI of the brainstem and cerebellum was negative for acute ischemia. Echocardiogram with bubbles significant for EF 45-50%, diffuse hypokinesis, and Grade 1 diastolic dysfunction.  Patient was started on Plavix and Atorvastatin.  It was determined that patient's dizziness was a mix of central vertigo and BPPV. SLP was consulted and provided several treatments throughout her stay. Patient's dizziness was improved upon discharge. It was recommended patient be referred to ENT for further evaluation of vestibular symptoms.  Due to the echocardiogram results and uncontrolled blood pressure, cardiology was consulted. After further evaluation of echo, it was not believed that her chest pain was ACS in nature. It was recommended to optimize blood pressure control and to treat her OSA. Patient's Clonidine was weaned and discontinued on 10/12. She was started on Amlodipine, Carvedilol, and Losartan. Blood pressure improved. She was instructed to have event monitor completed as an outpatient to evaluate for a-fib/a-flutter due to complaints of palpitations during admission.  Patient's dizziness and chest pain were improved by discharge and home health PT, OT, and SLP were arranged for her as an outpatient.  Patient was recommended to have close follow-up with PCP  and cardiology for blood pressure monitoring and sleep apnea treatment.   Issues for Follow Up:  1. Recommend evaluation and management of untreated OSA to help improve symptoms and blood pressure 2. Please provide patient with referral to ENT for further evaluation of vestibular issues.  3. Recommend outpatient event monitoring for evaluation for possible a-fib/flutter  4. Please monitor BP closely as patient has had several new medications initiated during admision  Significant Procedures: MRI without contrast, CTA head/neck, abdominal aortic ultrasound, swallow study, echocardiogram with bubbles  Significant Labs and Imaging:  Recent Labs  Lab 09/18/18 0948 09/19/18 0439  WBC 9.7 9.5  HGB 15.9* 14.5  HCT 50.4* 45.9  PLT 266 200   Recent Labs  Lab 09/18/18 0948  NA 140  K 4.0  CL 105  CO2 24  GLUCOSE 108*  BUN 8  CREATININE 0.78  CALCIUM 9.6  ALKPHOS 99  AST 21  ALT 16  ALBUMIN 3.8   Ct Angio Head W Or Wo Contrast  Result Date: 09/18/2018 CLINICAL DATA:  Dizziness with difficulty walking. EXAM: CT ANGIOGRAPHY HEAD AND NECK TECHNIQUE: Multidetector CT imaging of the head and neck was performed using the standard protocol during bolus administration of intravenous contrast. Multiplanar CT image reconstructions and MIPs were obtained to evaluate the vascular anatomy. Carotid stenosis measurements (when applicable) are obtained utilizing NASCET criteria, using the distal internal carotid diameter as the denominator. CONTRAST:  75mL ISOVUE-370 IOPAMIDOL (ISOVUE-370) INJECTION 76% COMPARISON:  None. Brain MRI 09/18/2018. Head and neck CTA 02/11/2018. FINDINGS: CT HEAD FINDINGS Brain: There is no evidence of acute infarct, intracranial hemorrhage, mass, midline shift, or extra-axial fluid collection. 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 The ventricles are normal in size. Vascular: Calcified atherosclerosis at the skull base. Skull: No fracture or focal  osseous lesion. Sinuses: Visualized paranasal sinuses and mastoid air cells are clear. Orbits: Unremarkable. CTA NECK FINDINGS Aortic arch: Normal variant aortic arch branching pattern with common origin of the brachiocephalic and left common carotid arteries. Mild arch atherosclerosis. Widely patent arch vessel origins. Right carotid system: Patent without evidence of stenosis, dissection, or significant atherosclerosis. Left carotid system: Patent without evidence of stenosis, dissection, or significant atherosclerosis. Vertebral arteries: Patent and codominant without evidence of stenosis or dissection. Skeleton: Mild cervical spondylosis. No acute osseous abnormality or suspicious osseous lesion. Poor dentition with multiple dental caries as well as prominent periapical lucency involving the lone remaining left mandibular molar tooth. Other neck: No evidence of acute abnormality or mass. Borderline enlarged level IIa lymph nodes measuring up to 1 cm in short axis on the left, unchanged from the prior CTA. Upper chest: Similar mosaic attenuation in the left greater than right lung apices which may reflect air trapping. Review of the MIP images confirms the above findings CTA HEAD FINDINGS Anterior circulation: The internal carotid arteries are patent from skull base to carotid termini with mild atherosclerosis bilaterally not resulting in significant stenosis. ACAs and MCAs are patent without evidence of proximal branch occlusion or flow limiting proximal stenosis. Mild branch vessel irregularity is noted bilaterally. No aneurysm is identified. Posterior circulation: The intracranial vertebral arteries are widely patent to the basilar. Dominant right PICA and left AICA. Patent bilateral SCA origins. The basilar artery is patent and mildly small in caliber diffusely without significant focal stenosis. There are left larger than right posterior communicating arteries with left P1 hypoplasia. Diffuse P2 and more  distal branch vessel irregularity is more notable than on the prior CTA and is likely in part technical as well as reflecting underlying atherosclerosis. No aneurysm is identified. Venous sinuses: Patent as permitted by arterial phase contrast timing. Anatomic variants: Hypoplastic left P1 segment. Delayed phase: No abnormal enhancement. Review of the MIP images confirms the above findings IMPRESSION: 1. No emergent large vessel occlusion. 2. Widely patent cervical carotid and vertebral arteries. 3. Mild intracranial atherosclerosis without evidence of major branch occlusion or flow limiting proximal stenosis. 4.  Aortic Atherosclerosis (ICD10-I70.0). Electronically Signed   By: Logan Bores M.D.   On: 09/18/2018 18:06   Dg Chest 2 View  Result Date: 09/18/2018 CLINICAL DATA:  Chest pain, palpitations EXAM: CHEST - 2 VIEW COMPARISON:  08/02/2018 FINDINGS: Cardiomegaly. No confluent airspace opacities or effusions. No acute bony abnormality. IMPRESSION: Cardiomegaly.  No active disease. Electronically Signed   By: Rolm Baptise M.D.   On: 09/18/2018 10:55   Ct Angio Neck W Or Wo Contrast  Result Date: 09/18/2018 CLINICAL DATA:  Dizziness with difficulty walking. EXAM: CT ANGIOGRAPHY HEAD AND NECK TECHNIQUE: Multidetector CT imaging of the head and neck was performed using the standard protocol during bolus administration of intravenous contrast. Multiplanar CT image reconstructions and MIPs were obtained to evaluate the vascular anatomy. Carotid stenosis measurements (when applicable) are obtained utilizing NASCET criteria, using the distal internal carotid diameter as the denominator. CONTRAST:  13mL ISOVUE-370 IOPAMIDOL (ISOVUE-370) INJECTION 76% COMPARISON:  None. Brain MRI 09/18/2018. Head and neck CTA 02/11/2018. FINDINGS: CT HEAD FINDINGS Brain: There is no evidence of acute infarct, intracranial hemorrhage, mass, midline shift, or extra-axial fluid collection. As seen on MRI, there is a small chronic  left cerebellar infarct. The ventricles are normal in size. Vascular: Calcified atherosclerosis at the skull base. Skull: No fracture or focal osseous lesion. Sinuses: Visualized paranasal sinuses and mastoid air cells are clear. Orbits: Unremarkable. CTA NECK FINDINGS Aortic arch: Normal variant aortic arch branching pattern with common origin of the brachiocephalic and left common carotid arteries. Mild arch atherosclerosis. Widely patent arch vessel origins. Right carotid system: Patent without evidence of stenosis, dissection, or significant atherosclerosis. Left carotid system: Patent without evidence of stenosis, dissection, or significant atherosclerosis. Vertebral arteries: Patent and codominant without evidence of stenosis or dissection. Skeleton: Mild cervical spondylosis. No acute osseous abnormality or suspicious osseous lesion. Poor dentition with multiple dental caries as well as prominent periapical lucency involving the lone remaining left mandibular molar tooth. Other neck: No evidence of acute abnormality or mass. Borderline enlarged level IIa lymph nodes measuring up to 1 cm in short axis on the left, unchanged from the prior CTA. Upper chest: Similar mosaic attenuation in the left greater than right lung apices which may reflect air trapping. Review of the MIP images confirms the above findings CTA HEAD FINDINGS Anterior circulation: The internal carotid arteries are patent from skull base to carotid termini with mild atherosclerosis bilaterally not resulting in significant stenosis. ACAs and MCAs are patent without evidence of proximal branch occlusion or flow limiting proximal stenosis. Mild branch vessel irregularity is noted bilaterally. No aneurysm is identified. Posterior circulation: The intracranial vertebral arteries are widely patent to the basilar. Dominant right PICA and left AICA. Patent bilateral SCA origins. The basilar artery is patent and mildly small in caliber diffusely without  significant focal stenosis. There are left larger than right posterior communicating arteries with left P1 hypoplasia. Diffuse P2 and more distal branch vessel irregularity is more notable than on the  prior CTA and is likely in part technical as well as reflecting underlying atherosclerosis. No aneurysm is identified. Venous sinuses: Patent as permitted by arterial phase contrast timing. Anatomic variants: Hypoplastic left P1 segment. Delayed phase: No abnormal enhancement. Review of the MIP images confirms the above findings IMPRESSION: 1. No emergent large vessel occlusion. 2. Widely patent cervical carotid and vertebral arteries. 3. Mild intracranial atherosclerosis without evidence of major branch occlusion or flow limiting proximal stenosis. 4.  Aortic Atherosclerosis (ICD10-I70.0). Electronically Signed   By: Logan Bores M.D.   On: 09/18/2018 18:06   Mr Brain Wo Contrast  Result Date: 09/19/2018 CLINICAL DATA:  52 year old female with persistent dizziness, blurred vision, nystagmus. MRI yesterday negative for acute ischemia but demonstrates a small left cerebellar infarct which is new since March. Limited additional diffusion MRI as thin cuts through the brainstem and cerebellum are requested by neurology. EXAM: LIMITED MRI HEAD WITHOUT CONTRAST TECHNIQUE: Diffusion MRI of the posterior fossa without intravenous contrast. COMPARISON:  CTA head and neck and brain MRI 09/18/2018. FINDINGS: Thin slice axial and coronal 3 T diffusion weighted imaging centered on the brainstem and cerebellum reveals no restricted diffusion. Small focus of facilitated diffusion corresponding to the chronic left cerebellar infarct redemonstrated. IMPRESSION: No evidence of acute ischemia on thin slice diffusion imaging centered on the brainstem and cerebellum. Electronically Signed   By: Genevie Ann M.D.   On: 09/19/2018 12:27   Mr Brain Wo Contrast  Result Date: 09/18/2018 CLINICAL DATA:  52 year old female with dizziness for 2  days. Blurred vision, severe pressure in the head and chest. EXAM: MRI HEAD WITHOUT CONTRAST TECHNIQUE: Multiplanar, multiecho pulse sequences of the brain and surrounding structures were obtained without intravenous contrast. COMPARISON:  Brain MRI and CTA head and neck 02/11/2018. FINDINGS: Brain: A small chronic infarct in the left cerebellum is chronic but new since 02/11/2018 (series 7, image 7). No restricted diffusion or evidence of acute infarction. Signal in the brainstem and deep gray matter nuclei remains normal. Scattered small cerebral white matter T2 and FLAIR hyperintense foci appear stable and are in a nonspecific configuration. No cortical encephalomalacia or chronic cerebral blood products. No midline shift, mass effect, evidence of mass lesion, ventriculomegaly, extra-axial collection or acute intracranial hemorrhage. Cervicomedullary junction and pituitary are within normal limits. Vascular: Major intracranial vascular flow voids are stable and preserved. Skull and upper cervical spine: Negative visible cervical spine. Normal bone marrow signal. Sinuses/Orbits: Stable and negative. Other: Stable adenoid hypertrophy. Mastoids remain clear. Visible internal auditory structures appear normal. Scalp and face soft tissues appear negative. IMPRESSION: 1. There is no acute infarct or acute intracranial abnormality, but a small left cerebellar infarct is new since the March 2019 MRI. 2. Stable MRI appearance the brain otherwise, with moderate for age nonspecific white matter signal changes. Electronically Signed   By: Genevie Ann M.D.   On: 09/18/2018 14:33   US Aorta Complete  Result Date: 09/18/2018 CLINICAL DATA:  Chest pain EXAM: ULTRASOUND OF ABDOMINAL AORTA TECHNIQUE: Ultrasound examination of the abdominal aorta was performed to evaluate for abdominal aortic aneurysm. COMPARISON:  None. FINDINGS: Abdominal aortic measurements as follows: Proximal:  2.2 x 2.7 cm Mid:  2.1 x 1.9 cm Distal:  1.7 x  1.7 cm Right common iliac artery: 1.1 x 0.9 cm Left common iliac artery: 1.0 x 1.2 cm Distal aorta velocity: 74 cm/sec IMPRESSION: No evidence of abdominal aortic aneurysm. Electronically Signed   By: Julian Hy M.D.   On: 09/18/2018 18:20  Results/Tests Pending at Time of Discharge: none  Discharge Medications:  Allergies as of 09/24/2018      Reactions   Morphine And Related Other (See Comments)   Headache   Other Other (See Comments)   CANNOT EAT ANYTHING WITH SEEDS (DIVERTICULITIS)   Peanut-containing Drug Products Other (See Comments)   Has Diverticulitis    Toradol [ketorolac Tromethamine] Anxiety      Medication List    STOP taking these medications   acetaminophen 325 MG tablet Commonly known as:  TYLENOL   cloNIDine 0.1 MG tablet Commonly known as:  CATAPRES   HYDROcodone-acetaminophen 5-325 MG tablet Commonly known as:  NORCO/VICODIN   polyethylene glycol packet Commonly known as:  MIRALAX / GLYCOLAX     TAKE these medications   amLODipine 5 MG tablet Commonly known as:  NORVASC Take 1 tablet (5 mg total) by mouth daily. Start taking on:  09/25/2018   atorvastatin 40 MG tablet Commonly known as:  LIPITOR Take 1 tablet (40 mg total) by mouth daily.   carvedilol 6.25 MG tablet Commonly known as:  COREG Take 1 tablet (6.25 mg total) by mouth 2 (two) times daily with a meal.   clopidogrel 75 MG tablet Commonly known as:  PLAVIX Take 1 tablet (75 mg total) by mouth daily. Start taking on:  09/25/2018   cyclobenzaprine 10 MG tablet Commonly known as:  FLEXERIL Take 10 mg by mouth 3 (three) times daily as needed for muscle spasms.   FLUoxetine 20 MG capsule Commonly known as:  PROZAC Take 60 mg by mouth daily.   furosemide 20 MG tablet Commonly known as:  LASIX Take 20 mg by mouth every morning.   gabapentin 600 MG tablet Commonly known as:  NEURONTIN Take 600 mg by mouth 3 (three) times daily.   losartan 25 MG tablet Commonly known as:   COZAAR Take 1 tablet (25 mg total) by mouth daily. Start taking on:  09/25/2018   meclizine 25 MG tablet Commonly known as:  ANTIVERT Take 1 tablet (25 mg total) by mouth 3 (three) times daily as needed for dizziness.   ondansetron 4 MG disintegrating tablet Commonly known as:  ZOFRAN-ODT Take 1 tablet (4 mg total) by mouth every 8 (eight) hours as needed for nausea or vomiting.   pantoprazole 40 MG tablet Commonly known as:  PROTONIX Take 40 mg by mouth daily.            Durable Medical Equipment  (From admission, onward)         Start     Ordered   09/24/18 1420  DME 3-in-1  Once     09/24/18 1420   09/20/18 0841  For home use only DME 3 n 1  Once     09/20/18 0840   09/20/18 0840  For home use only DME Walker rolling  Once    Question:  Patient needs a walker to treat with the following condition  Answer:  Gait instability   09/20/18 0840          Discharge Instructions: Please refer to Patient Instructions section of EMR for full details.  Patient was counseled important signs and symptoms that should prompt return to medical care, changes in medications, dietary instructions, activity restrictions, and follow up appointments.   Follow-Up Appointments: Follow-up Information    Audley Hose, MD. Call.   Specialty:  Internal Medicine Why:  Please call and make a hospital follow up appointment to be seen within 1 week of leaving  the hospital. Contact information: [place] [address] [place] Follow up.   Specialty:  Rehabilitation Why:  for vestibular pt- they will contact you with an apt, if you do not hear from them by Wed please give them a call.  Contact information: [place] [phone] [place] [id number] [place], [place], DO 09/24/2018, 5:00 PM PGY-1, [place]

## 2018-09-22 NOTE — Progress Notes (Signed)
Progress Note  Patient Name: Megan Dixon Date of Encounter: 09/22/2018  Primary Cardiologist: New (Dr. Harrell Gave)  Subjective   Feeling much better today. Ambulated in the hall, with some mild vertigo, but able to tolerate. Headache improved.   Inpatient Medications    Scheduled Meds: .  stroke: mapping our early stages of recovery book   Does not apply Once  . amLODipine  5 mg Oral Daily  . atorvastatin  40 mg Oral q1800  . carvedilol  3.125 mg Oral BID WC  . cloNIDine  0.1 mg Oral QODAY  . clopidogrel  75 mg Oral Daily  . enoxaparin (LOVENOX) injection  40 mg Subcutaneous Q24H  . FLUoxetine  60 mg Oral Daily  . furosemide  20 mg Oral BH-q7a  . gabapentin  600 mg Oral TID  . pantoprazole  40 mg Oral Daily   Continuous Infusions:  PRN Meds: acetaminophen **OR** acetaminophen (TYLENOL) oral liquid 160 mg/5 mL **OR** acetaminophen, cyclobenzaprine, diphenhydrAMINE, HYDROcodone-acetaminophen, labetalol, prochlorperazine   Vital Signs    Vitals:   09/21/18 0508 09/21/18 1509 09/21/18 2132 09/22/18 0422  BP: (!) 162/107 (!) 156/94 139/84 (!) 164/107  Pulse: 75 90 92 85  Resp:  20 16 18   Temp:  98.7 F (37.1 C) 98 F (36.7 C) 98 F (36.7 C)  TempSrc:  Oral Oral Oral  SpO2:  98% 97% 99%    Intake/Output Summary (Last 24 hours) at 09/22/2018 1400 Last data filed at 09/22/2018 1000 Gross per 24 hour  Intake 360 ml  Output -  Net 360 ml   There were no vitals filed for this visit.  Telemetry    Sinus rhythm - Personally Reviewed  ECG    No new - Personally Reviewed  Physical Exam   GEN: No acute distress.  Resting comfortably in the bed. Neck: No JVD Cardiac: RRR, no murmurs, rubs, or gallops.  Respiratory: Clear to auscultation bilaterally. GI: Soft, nontender, non-distended  MS: No edema; No deformity. Neuro:  Nonfocal  Psych: Normal affect   Labs    Chemistry Recent Labs  Lab 09/18/18 0948  NA 140  K 4.0  CL 105  CO2 24  GLUCOSE  108*  BUN 8  CREATININE 0.78  CALCIUM 9.6  PROT 8.2*  ALBUMIN 3.8  AST 21  ALT 16  ALKPHOS 99  BILITOT 0.5  GFRNONAA >60  GFRAA >60  ANIONGAP 11     Hematology Recent Labs  Lab 09/18/18 0948 09/19/18 0439  WBC 9.7 9.5  RBC 5.62* 5.11  HGB 15.9* 14.5  HCT 50.4* 45.9  MCV 89.7 89.8  MCH 28.3 28.4  MCHC 31.5 31.6  RDW 14.3 14.6  PLT 266 200    Cardiac EnzymesNo results for input(s): TROPONINI in the last 168 hours.  Recent Labs  Lab 09/18/18 0956  TROPIPOC 0.02     BNPNo results for input(s): BNP, PROBNP in the last 168 hours.   DDimer No results for input(s): DDIMER in the last 168 hours.   Radiology    No results found.  Cardiac Studies   Echo 09/20/18 Study Conclusions  - Left ventricle: The cavity size was mildly dilated. Wall   thickness was increased in a pattern of mild LVH. Systolic   function was mildly reduced. The estimated ejection fraction was   in the range of 45% to 50%. Diffuse hypokinesis. Doppler   parameters are consistent with abnormal left ventricular   relaxation (grade 1 diastolic dysfunction).  Impressions:  - Mild global reduction  in LV systolic function (EF 50); mild LVE;   mild LVH; mild diastolic dysfunction.  Personally reviewed, low normal EF, no focal wall motion abnormalities  Patient Profile     52 y.o. female of fibromyalgia, depression, obsessive/compulsive tendencies, possible multiple sclerosis, abnormal lupus bloodwork, 40 yrs former tobacco abuse, HTN, morbid obesity, recently dx sleep apnea, longstanding palpitations who is seen in consult for evaluation of BP control, borderline EF, and intermittent palpitations/brief chest tightness.  Assessment & Plan    Hypertension:  -recommend increasing the carvedilol to 6.25 mg BID. Amlodipine can take several days to reach peak effect, but can titrate to 10 mg if blood pressure still elevated. The side effect of amlodipine is more LE ankle edema at the higher dose.  This can be offset with chlorthalidone for both BP control and LE edema. -continue clonidine wean as noted yesterday -order of medications: carvedilol uptitrate as heart rate allows, uptitrate amlodipine, add chlorthalidone, consider ACEI/ARB, then spironolactone -if she is on max tolerated doses of three or more agents, including a diuretic, would do a secondary hypertension workup as an outpatient. TSH normal.  Atypical chest discomfort, palpitations, borderline EF:  -blood pressure control as above -event monitor after discharge  Borderline EF, grade 1 diastolic dysfunction: personally reviewed, diffuse borderline hypokinesis. May be due to hypertension and/or sleep apnea. Continue to treat these issues, EF can be followed as an outpatient.  See prior note re: nonacute cerebellar infarct, vertigo   For questions or updates, please contact [place] Please consult [email or web address] for contact info under   Signed, Buford Dresser, MD  09/22/2018, 2:00 PM

## 2018-09-22 NOTE — Progress Notes (Addendum)
Family Medicine Teaching Service Daily Progress Note Intern Pager: [phone]  Patient name: Megan Dixon Medical record number: [id number] Date of birth: May 22, 1966 Age: 52 y.o. Gender: female  Primary Care Provider: System, Provider Not In Consultants: Neurology, PT, OT, SLP, Cardiology Code Status: Full code  Pt Overview and Major Events to Date:  09/18/2018 Admitted 09/18/2018: MRI w/out contrast, CTA head/neck, abdominal aortic ultrasound 09/19/2018: Swallow study 09/20/2018: Echo w/ bubbles Hospital Day: 5   Assessment and Plan: Megan Dixon is a 52 y.o. female presenting with vertigo like dizziness and intermittent chest pain. PMH is significant for HTN and recent admission for sepsis in August. Reported history of Multiple Sclerosis, although patient has not had workup.   Mild Cardiomyopathy: treating as new onset HFpEF Echo EF 45-50%, diffuse hypokinesis, and Grade 1 diastolic dysfunction. Cardiology consulted. Do not believe chest pain is ACS in nature. Recommend optimizing BP control and treating OSA.  Treating as new onset HFpEF. As she transitions to HFrEF, will add ACE/ARB if not done so before.  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 Per cardiology, not believed to be 2/2 to cardioembolic event.  - Follow-up neuro recs  -Recommend follow-up outpatient neurology PRN - Continue ASA 81mg  and Plavix 75mg   - Continue Atorvastatin 40mg   - Cardiac monitoring - continuous pulse ox  Posterior Headache: This morning patient endorsed only minor head pain s/p dose of Flexeril and Noroc at 7am.  BP overnight 140-160's/90-100's with AM BP: 162/107. Complex migraine vs analgesic overuse vs MSK. Will continue  analgesics for pain relief.  - Migraine Cocktail PRN: Flexril 10mg , Benadryl, Compazine - Continue home Norco PRN head pain - Tylenol PRN pain  Vertigo: likely mix of central vertigo and Left ear BPPV Patient notes much improvement in dizziness s/p PT treatments throughout hospital stay. PT/OT/SLP all recommend home health with 24 hour supervision. Patient has good support at home with daughter and grandson. PT also recommends ENT evaluation as outpatient. Will discharge with ambulatory referral to ENT. - SLP/OT/PT consult - recommend continued home health - Discharge with ambulatory referral to ENT - Home health orders placed  HTN:  BP overnight: 140-160;s/80-100's. BP this AM: 164/10. BP still uncontrolled. TSH level WNL. Per cardiology, wean Clonidine and begin Coreg and Norvasc. Will titrate up as tolerated and consider adding ACE-I if no improvement - Cardiology following - will follow up with their recommendations and adjustments - Lobetalol 10mg  for BP >220/110  - Continue home Lasix  - Wean home Clonidine - last dose will be on 10/12 - Begin Carvedilol 3.125 BID - Begin amlodipine 5 mg QD - Will continue to monitor  HLD:  Lipid panel: Chol 205, Trig: 129, HDL 38, LDL 141. 10 year ASCVD risk 19.3%.  - Continue Atorvastatin 40mg   Fibromyalgia:  - Continue home gabapentin 600mg  TID, fluoxetine 60mg  QD, PRN flexeril  Multiple sclerosis -This has been quiescent for years and she denies any chronic deficit. Stable. Reported by a Dr. Maxine Glenn in [place] many years ago  Fluids:Saline lock Electrolytes: Replace PRN Nutrition: Heart healthy diet GI ppx: Protonix DVT ppx: SCDs  Future labs: None Disposition: Home pending work up and improvement  ================================================= =================================================  Subjective:  Patient reports doing well overnight. Posterior head pain yesterday significantly improved with her home  Norco  and PRN flexeril. Minimal dizziness.  Pt has had good appetite and normal voids. Reports 4 episodes of diarrhea this morning. No abdominal pain, fever, chills, or other concerns.  Objective: Vital Signs Temp:  [98 F (36.7 C)-98.7 F (37.1 C)] 98 F (36.7 C) (10/10 0422) Pulse Rate:  [85-92] 85 (10/10 0422) Resp:  [16-20] 18 (10/10 0422) BP: (139-164)/(84-107) 164/107 (10/10 0422) SpO2:  [97 %-99 %] 99 % (10/10 0422)  Intake/Output 10/09 0701 - 10/10 0700 In: 600 [P.O.:600] Out: -   Physical Exam:  Gen: NAD, alert, non-toxic, well-appearing, lying in bed eating breakfast Skin: Warm and dry. No obvious rashes, lesions, or trauma. HEENT: NCAT, PERRLA, No conjunctival pallor or injection. No scleral icterus or injection.  MMM.  CV: RRR. <2s capillary refill bilaterally.  RP & DPs 2+ bilaterally. No bilateral LE edema Resp: CTAB.  No wheezing, rales, abnormal lung sounds.  No increased WOB Abd: NTND on palpation to all 4 quadrants.  Positive bowel sounds. Large abdominal scar present along medial right abdomin from prior hernia surgery Psych: Cooperative with exam. Pleasant. Makes eye contact. Speech normal. Extremities: Moves all extremities spontaneously  Neuro: CN II-XII grossly intact. No FNDs. 5/5 strength on the right, 4/5 strength on the left. Slightly slower but more coordinated finger to nose on the left compared to yesterday. Heel to shin normal on the right. Heel to shin slower and less coordinated on the left, same as yesterday.  Laboratory: Recent Labs  Lab 09/18/18 0948 09/19/18 0439  WBC 9.7 9.5  HGB 15.9* 14.5  HCT 50.4* 45.9  PLT 266 200   Recent Labs  Lab 09/18/18 0948  NA 140  K 4.0  CL 105  CO2 24  BUN 8  CREATININE 0.78  CALCIUM 9.6  PROT 8.2*  BILITOT 0.5  ALKPHOS 99  ALT 16  AST 21  GLUCOSE 108*   Imaging/Diagnostic Tests: No results found.  Danna Hefty, DO 09/22/2018, 9:54 AM PGY-1, [place] Intern pager:  [phone], text pages welcome

## 2018-09-23 MED ORDER — LOSARTAN POTASSIUM 50 MG PO TABS
25.0000 mg | ORAL_TABLET | Freq: Every day | ORAL | Status: DC
  Administered 2018-09-23 – 2018-09-24 (×2): 25 mg via ORAL
  Filled 2018-09-23 (×2): qty 1

## 2018-09-23 NOTE — Progress Notes (Signed)
Family Medicine Teaching Service Daily Progress Note Intern Pager: [phone]  Patient name: Megan Dixon Medical record number: [id number] Date of birth: 22-May-1966 Age: 52 y.o. Gender: female  Primary Care Provider: System, Provider Not In Consultants: Neurology, PT, OT, SLP, Cardiology Code Status: Full code  Pt Overview and Major Events to Date:  09/18/2018 Admitted 09/18/2018: MRI w/out contrast, CTA head/neck, abdominal aortic ultrasound 09/19/2018: Swallow study 09/20/2018: Echo w/ bubbles Hospital Day: 6   Assessment and Plan: Megan Dixon is a 52 y.o. female presenting with vertigo like dizziness and intermittent chest pain. PMH is significant for HTN and recent admission for sepsis in August. Reported history of Multiple Sclerosis, although patient has not had workup.   Mild Cardiomyopathy: treating as new onset HFpEF Echo EF 45-50%, diffuse hypokinesis, and Grade 1 diastolic dysfunction. Cardiology consulted. Recommend optimizing BP control and treating OSA. Treating as new onset HFpEF. BP overnight: 128-152/73-94. Will add an ARB. 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 Per cardiology, not believed to be 2/2 to cardioembolic event.  - Follow-up neuro recs  -Recommend follow-up outpatient neurology PRN - Continue ASA 81mg  and Plavix 75mg   - Continue Atorvastatin 40mg    Posterior Headache: This morning patient endorsed posterior head pain similar to days prior. Had not had dose of Norco or Flexeril since day prior.Neuro exam unchanged from days prior. Differential includes complex migraine vs analgesic overuse vs MSK. Will continue analgesics  for pain relief at this time.  - Migraine Cocktail PRN: Flexril 10mg , Benadryl, Compazine - Continue home Norco PRN head pain - Tylenol PRN pain  Vertigo: likely mix of central vertigo and Left ear BPPV Patient notes worsening in dizziness upon waking up. Appears to be worse when her head hurts. Recommended treating her headache at this time. Will reasses her dizziness upon treatment of her headache.  - SLP/OT/PT consult - recommend continued home health - Discharge with ambulatory referral to ENT - Home health orders placed  HTN:  BP overnight: 130-150;s/70-90's. BP this AM: 152/88. Episodes of elevated. Will add Losartan 25mg  QD and continue to monitor - Cardiology following - will follow up with their recommendations and adjustments - Lobetalol 10mg  for BP >220/110  - Continue home Lasix  - Wean home Clonidine - last dose will be on 10/12 - Begin Carvedilol 6.25 BID - Continue amlodipine 5 mg QD - Begin Losartan 25mg  - Will continue to monitor  HLD:  Lipid panel: Chol 205, Trig: 129, HDL 38, LDL 141. 10 year ASCVD risk 19.3%.  - Continue Atorvastatin 40mg   Fibromyalgia:  - Continue home gabapentin 600mg  TID, fluoxetine 60mg  QD, PRN flexeril  Multiple sclerosis -This has been quiescent for years and she denies any chronic deficit. Stable. Reported by a Dr. Maxine Dixon in [place] many years ago  Fluids:Saline lock Electrolytes: Replace PRN Nutrition: Heart healthy diet GI ppx: Protonix DVT ppx: SCDs  Future labs: None Disposition: Home tomorrow  ================================================= =================================================  Subjective:  Patient reports doing well overnight but woke up with the same headache, blurry vision, and worsening dizziness. Patient notes she had not had any of her pain medicines since last night. Pt has had good appetite and normal voids and BM. No abdominal pain, fever,  chills, or other concerns.  Objective: Vital  Signs Temp:  [97.7 F (36.5 C)-98.9 F (37.2 C)] 98.9 F (37.2 C) (10/11 1400) Pulse Rate:  [84-90] 90 (10/11 1400) Resp:  [16-18] 16 (10/11 1400) BP: (130-152)/(73-94) 130/88 (10/11 1400) SpO2:  [98 %-100 %] 98 % (10/11 1400)  Intake/Output 10/10 0701 - 10/11 0700 In: 920 [P.O.:920] Out: -   Physical Exam:  Gen: NAD, alert, non-toxic, well-appearing but tearful Skin: Warm and dry. No obvious rashes, lesions, or trauma. HEENT: NCAT, PERRLA, No conjunctival pallor or injection. No scleral icterus or injection.  MMM.  CV: RRR. <2s capillary refill bilaterally.  RP & DPs 2+ bilaterally. No bilateral LE edema Resp: CTAB.  No wheezing, rales, abnormal lung sounds.  No increased WOB Abd: NTND on palpation to all 4 quadrants.  Positive bowel sounds. Large abdominal scar present along medial right abdomin from prior hernia surgery Psych: Cooperative with exam. Pleasant. Makes eye contact. Speech normal. Extremities: Moves all extremities spontaneously  Neuro: CN II-XII grossly intact. No FNDs. 5/5 strength on the right, 4/5 strength on the left. Heel to shin normal on the right. Heel to shin slower and less coordinated on the left, same as yesterday.  Laboratory: Recent Labs  Lab 09/18/18 0948 09/19/18 0439  WBC 9.7 9.5  HGB 15.9* 14.5  HCT 50.4* 45.9  PLT 266 200   Recent Labs  Lab 09/18/18 0948  NA 140  K 4.0  CL 105  CO2 24  BUN 8  CREATININE 0.78  CALCIUM 9.6  PROT 8.2*  BILITOT 0.5  ALKPHOS 99  ALT 16  AST 21  GLUCOSE 108*   Imaging/Diagnostic Tests: No results found.  Megan Dixon [place], DO 09/23/2018, 5:32 PM PGY-1, [facility] Intern pager: [phone], text pages welcome

## 2018-09-23 NOTE — Plan of Care (Signed)
Pt will learn to cope with new diagnosis

## 2018-09-23 NOTE — Progress Notes (Signed)
Physical Therapy Treatment Patient Details Name: Megan Dixon MRN: [id number] DOB: [birth year] Today's Date: 09/23/2018    History of Present Illness 52yo female c/o impaired ambulation with 1 episode NBNB emesis, intermittent chest pain, CTA negative, negative for AAA. MD now requesting vestibular evaluation. 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 Pt currently with functional limitations due to balance, vertigo and endurance deficits. Pt was more dizzy on arrival today. Retested canals and appears pt with right anterior canal BPPV therefore treated with canalith repositioning maneuver.  Dizziness 10/10 to 8/10 after treatment.   Pt will benefit from skilled PT to increase their independence and safety with mobility to allow discharge to the venue listed below.     Follow Up Recommendations  Home health PT;Other (comment)(needs vestibular follow  up!!! )     Equipment Recommendations  3in1 (PT)    Recommendations for Other Services       Precautions / Restrictions Precautions Precautions: Fall Precaution Comments: monitor for dizziness Restrictions Weight Bearing Restrictions: No    Mobility  Bed Mobility Overal bed mobility: Needs Assistance Bed Mobility: Supine to Sit;Sit to Supine     Supine to sit: Supervision Sit to supine: Supervision   General bed mobility comments: supervision for safety.  reports dizziness worse this am when she woke up  on PT arrival.    Transfers Overall transfer level: Needs assistance Equipment used: Rolling walker (2 wheeled) Transfers: Sit to/from [facility] Sit to Stand: Supervision            Ambulation/Gait Ambulation/Gait assistance: Min guard Gait Distance (Feet): 45 Feet Assistive device: Rolling walker (2 wheeled) Gait Pattern/deviations: Step-through pattern;Decreased step  length - right;Decreased step length - left;Decreased stride length   Gait velocity interpretation: <1.31 ft/sec, indicative of household ambulator General Gait Details: min guard for safety. Pt using targets as needed to minimize dizziness.  Went to bathroom and urinated first.  Walked back to bed to test for BPPV.  Positive for right anterior canal BPPV.  Performed canalith repositioning maneuver and pt reports that symptoms from 10/10 on arrival to 8/10.  will check back later in pm.    Stairs             Wheelchair Mobility    Modified Rankin (Stroke Patients Only)       Balance Overall balance assessment: Needs assistance Sitting-balance support: Feet supported;Bilateral upper extremity supported Sitting balance-Leahy Scale: Fair     Standing balance support: During functional activity;No upper extremity supported Standing balance-Leahy Scale: Fair                              Cognition Arousal/Alertness: Awake/alert Behavior During Therapy: WFL for tasks assessed/performed Overall Cognitive Status: Within Functional Limits for tasks assessed                                        Exercises Other Exercises Other Exercises: Reviewed Nestor Lewandowsky for pt to initiate later today.     General Comments        Pertinent Vitals/Pain Pain Assessment: No/denies pain    Home Living  Prior Function            PT Goals (current goals can now be found in the care plan section) Progress towards PT goals: Progressing toward goals    Frequency    Min 4X/week      PT Plan Current plan remains appropriate    Co-evaluation              AM-PAC PT "6 Clicks" Daily Activity  Outcome Measure  Difficulty turning over in bed (including adjusting bedclothes, sheets and blankets)?: A Little Difficulty moving from lying on back to sitting on the side of the bed? : A Little Difficulty sitting down on and  standing up from a chair with arms (e.g., wheelchair, bedside commode, etc,.)?: A Little Help needed moving to and from a bed to chair (including a wheelchair)?: A Little Help needed walking in hospital room?: A Little Help needed climbing 3-5 steps with a railing? : A Lot 6 Click Score: 17    End of Session Equipment Utilized During Treatment: Gait belt Activity Tolerance: Patient tolerated treatment well;Patient limited by fatigue Patient left: with call bell/phone within reach;in chair Nurse Communication: Mobility status PT Visit Diagnosis: Unsteadiness on feet (R26.81);Muscle weakness (generalized) (M62.81);Other abnormalities of gait and mobility (R26.89);Dizziness and giddiness (R42)     Time: 3845-3646 PT Time Calculation (min) (ACUTE ONLY): 30 min  Charges:  $Therapeutic Exercise: 8-22 mins $Canalith Rep Proc: 8-22 mins                     Jame Morrell,PT Acute Rehabilitation Services Pager:  [phone]  Office:  [place] 09/23/2018, 1:49 PM

## 2018-09-23 NOTE — Progress Notes (Signed)
Care of Pt assumed at this time. SBAR received from [place], Therapist, sports. Agree with previously documented assessment. Will continue to monitor Pt.

## 2018-09-23 NOTE — Progress Notes (Signed)
   09/23/18 1600  PT Visit Information  Last PT Received On 09/23/18  Assistance Needed +1  History of Present Illness 52yo female c/o impaired ambulation with 1 episode NBNB emesis, intermittent chest pain, CTA negative, negative for AAA. MD now requesting vestibular evaluation. PMH fibromyalgia, HTN, lupus, MS, joint replacement, recent hospitalization for CAP/sepsis, old L cerebellar CVA    Precautions  Precautions Fall  Precaution Comments monitor for dizziness  Restrictions  Weight Bearing Restrictions No  Pain Assessment  Pain Assessment No/denies pain  Cognition  Arousal/Alertness Awake/alert  Behavior During Therapy WFL for tasks assessed/performed  Overall Cognitive Status Within Functional Limits for tasks assessed  Bed Mobility  Overal bed mobility Needs Assistance  Bed Mobility Supine to Sit;Sit to Supine  Supine to sit Supervision  Sit to supine Supervision  Transfers  Overall transfer level Needs assistance  Equipment used Rolling walker (2 wheeled)  Transfers Sit to/from [employer] Transfers  Sit to Stand Supervision  Stand pivot transfers Min guard  General Comments  General comments (skin integrity, edema, etc.) Pt positive for right posterior canal BPPV therefore treated.  Pt dizziness 8/10 on arrival and to 5/10 at end of treatment.   Exercises  Exercises Other exercises  Other Exercises  Other Exercises Reviewed Nestor Lewandowsky exercise and pt to perform later tonight.   PT - End of Session  Equipment Utilized During Treatment Gait belt  Activity Tolerance Patient tolerated treatment well;Patient limited by fatigue  Patient left with call bell/phone within reach;in bed  Nurse Communication Mobility status   PT - Assessment/Plan  PT Plan Current plan remains appropriate  PT Visit Diagnosis Unsteadiness on feet (R26.81);Muscle weakness (generalized) (M62.81);Other abnormalities of gait and mobility (R26.89);Dizziness and giddiness (R42)  PT Frequency  (ACUTE ONLY) Min 4X/week  Follow Up Recommendations Home health PT;Other (comment) (needs vestibular follow  up!!! )  PT equipment 3in1 (PT)  AM-PAC PT "6 Clicks" Daily Activity Outcome Measure  Difficulty turning over in bed (including adjusting bedclothes, sheets and blankets)? 3  Difficulty moving from lying on back to sitting on the side of the bed?  3  Difficulty sitting down on and standing up from a chair with arms (e.g., wheelchair, bedside commode, etc,.)? 3  Help needed moving to and from a bed to chair (including a wheelchair)? 3  Help needed walking in hospital room? 3  Help needed climbing 3-5 steps with a railing?  2  6 Click Score 17  Mobility G Code  CK  PT Goal Progression  Progress towards PT goals Progressing toward goals  PT Time Calculation  PT Start Time (ACUTE ONLY) 1457  PT Stop Time (ACUTE ONLY) 1522  PT Time Calculation (min) (ACUTE ONLY) 25 min  PT General Charges  $$ ACUTE PT VISIT 1 Visit  PT Treatments  $Therapeutic Exercise 8-22 mins  $Therapeutic Activity 8-22 mins  Pt responding to treatment needing several repositioning maneuvers for BPPV.  Will continue to follow and address dizziness as pt tolerates.  Thanks. [place] Pager:  [phone]  Office:  [phone]

## 2018-09-23 NOTE — Progress Notes (Signed)
Progress Note  Patient Name: Megan Dixon Date of Encounter: 09/23/2018  Primary Cardiologist: New (Dr. Harrell Gave)  Subjective   Pt is tearful in her room today. She continues to have dizziness which is concerning for her.   Inpatient Medications    Scheduled Meds: .  stroke: mapping our early stages of recovery book   Does not apply Once  . amLODipine  5 mg Oral Daily  . atorvastatin  40 mg Oral q1800  . carvedilol  6.25 mg Oral BID WC  . cloNIDine  0.1 mg Oral QODAY  . clopidogrel  75 mg Oral Daily  . enoxaparin (LOVENOX) injection  40 mg Subcutaneous Q24H  . FLUoxetine  60 mg Oral Daily  . furosemide  20 mg Oral BH-q7a  . gabapentin  600 mg Oral TID  . pantoprazole  40 mg Oral Daily   Continuous Infusions:  PRN Meds: acetaminophen **OR** acetaminophen (TYLENOL) oral liquid 160 mg/5 mL **OR** acetaminophen, cyclobenzaprine, diphenhydrAMINE, labetalol, prochlorperazine   Vital Signs    Vitals:   09/22/18 1458 09/22/18 2149 09/23/18 0556 09/23/18 1008  BP: 128/78 134/73 (!) 140/94 (!) 152/88  Pulse: 90 85 84   Resp: 16 18 18    Temp: 98.8 F (37.1 C) 97.9 F (36.6 C) 97.7 F (36.5 C)   TempSrc: Oral Oral Oral   SpO2: 98% 99% 100%     Intake/Output Summary (Last 24 hours) at 09/23/2018 1401 Last data filed at 09/23/2018 0900 Gross per 24 hour  Intake 680 ml  Output -  Net 680 ml   There were no vitals filed for this visit.  Physical Exam   General: Well developed, well nourished, NAD Skin: Warm, dry, intact  Head: Normocephalic, atraumatic, clear, moist mucus membranes. Neck: Negative for carotid bruits. No JVD Lungs:Clear to ausculation bilaterally. No wheezes, rales, or rhonchi. Breathing is unlabored. Cardiovascular: RRR with S1 S2. No murmurs, rubs, gallops, or LV heave appreciated. Abdomen: Soft, non-tender, non-distended with normoactive bowel sounds. No obvious abdominal masses. MSK: Strength and tone appear normal for age. 5/5 in all  extremities Extremities: No edema. No clubbing or cyanosis. DP/PT pulses 2+ bilaterally Neuro: Alert and oriented. No focal deficits. No facial asymmetry. MAE spontaneously. Psych: Responds to questions appropriately with normal affect.    Labs    Chemistry Recent Labs  Lab 09/18/18 0948  NA 140  K 4.0  CL 105  CO2 24  GLUCOSE 108*  BUN 8  CREATININE 0.78  CALCIUM 9.6  PROT 8.2*  ALBUMIN 3.8  AST 21  ALT 16  ALKPHOS 99  BILITOT 0.5  GFRNONAA >60  GFRAA >60  ANIONGAP 11     Hematology Recent Labs  Lab 09/18/18 0948 09/19/18 0439  WBC 9.7 9.5  RBC 5.62* 5.11  HGB 15.9* 14.5  HCT 50.4* 45.9  MCV 89.7 89.8  MCH 28.3 28.4  MCHC 31.5 31.6  RDW 14.3 14.6  PLT 266 200    Cardiac EnzymesNo results for input(s): TROPONINI in the last 168 hours.  Recent Labs  Lab 09/18/18 0956  TROPIPOC 0.02     BNPNo results for input(s): BNP, PROBNP in the last 168 hours.   DDimer No results for input(s): DDIMER in the last 168 hours.   Radiology    No results found.  Telemetry    No currently on telemetry - Personally Reviewed  ECG    No new tracings as of 09/23/18- Personally Reviewed  Cardiac Studies   Echo 09/20/18 Study Conclusions  -  Left ventricle: The cavity size was mildly dilated. Wall thickness was increased in a pattern of mild LVH. Systolic function was mildly reduced. The estimated ejection fraction was in the range of 45% to 50%. Diffuse hypokinesis. Doppler parameters are consistent with abnormal left ventricular relaxation (grade 1 diastolic dysfunction).  Impressions:  - Mild global reduction in LV systolic function (EF 50); mild LVE; mild LVH; mild diastolic dysfunction.  Personally reviewed, low normal EF, no focal wall motion abnormalities  Patient Profile     52 y.o. female with a hx of of fibromyalgia, depression, obsessive/compulsive tendencies, possible multiple sclerosis, abnormal lupus bloodwork, 40 yrs former  tobacco abuse, HTN, morbid obesity, recently dx sleep apnea, long-standing palpitationswho is seen in consult for evaluation of BP control, borderline EF, and intermittent palpitations/brief chest tightness.  Assessment & Plan    1. Hypertension: -Improving, 152/88>140/94>134/73>128/78 -Carvedilol increased to 6.25mg  twice daily yesterday with BP improvement  -Can titrate Amlodipine as necessary>>>can increase LE swelling however can add HCTZ for this  -Currently on amlodipine 5 daily , carvedilol 6.25 twice daily, clonidine 0.1 every other day   2. Atypical chest pain with palpitations: -Plan for optimal blood pressure control>>>continues to have palpitations  -TSH, 0.737, WNL -Plan for event monitor after discharge for further assessment   3. Borderline LVEF with G1DD: -LVEF noted to be 45-50% with diffuse hypokinesis and G1DD per echocardiogram on 09/20/18 -Follow closely in OP setting  Signed, Kathyrn Drown NP-C [place] Pager: [phone] 09/23/2018, 2:01 PM     For questions or updates, please contact   Please consult [email or web address] for contact info under Cardiology/STEMI.

## 2018-09-24 MED ORDER — CARVEDILOL 6.25 MG PO TABS: 6.2500 mg | ORAL_TABLET | Freq: Two times a day (BID) | ORAL | 0 refills | Status: DC

## 2018-09-24 MED ORDER — AMLODIPINE BESYLATE 5 MG PO TABS: 5.0000 mg | ORAL_TABLET | Freq: Every day | ORAL | 0 refills | Status: DC

## 2018-09-24 MED ORDER — ATORVASTATIN CALCIUM 40 MG PO TABS: 40.0000 mg | ORAL_TABLET | Freq: Every day | ORAL | 0 refills | Status: DC

## 2018-09-24 MED ORDER — CLOPIDOGREL BISULFATE 75 MG PO TABS: 75.0000 mg | ORAL_TABLET | Freq: Every day | ORAL | 0 refills | Status: DC

## 2018-09-24 MED ORDER — LOSARTAN POTASSIUM 25 MG PO TABS: 25.0000 mg | ORAL_TABLET | Freq: Every day | ORAL | 0 refills | Status: DC

## 2018-09-24 NOTE — Care Management Note (Addendum)
Case Management Note  Patient Details  Name: Megan Dixon MRN: [id number] Date of Birth: Sep 06, 1966  Subjective/Objective:  Patient for dc today, pt rec outpt vestibular pt for patient, referral made thru epic at [facility].  NCM informed patient of this information.  Also NCM called Brad with [facility] to cancel [facility] services that was set up with them by previous NCM.  Patient can not do [place] and outpatient at the same time.  Patient states she does not want the 3 n 1.                Action/Plan: DC home when ready.  Expected Discharge Date:  09/24/18               Expected Discharge Plan:  OP Rehab  In-House Referral:     Discharge planning Services  CM Consult  Post Acute Care Choice:    Choice offered to:  Patient  DME Arranged:  3-N-1(owns cane, walker) DME Agency:  [place]:    [place]:     Status of Service:  Completed, signed off  If discussed at [place] of Stay Meetings, dates discussed:    Additional Comments:  Zenon Mayo, RN 09/24/2018, 1:39 PM

## 2018-09-24 NOTE — Discharge Instructions (Signed)
Dear Megan Dixon,   Thank you for letting us participate in your care! In this section, you will find a brief hospital admission summary of why you were admitted to the hospital, what happened, and any further follow up we think would benefit you and your health:   You were admitted because you were experiencing persistent dizziness and instability. Your initial work up was significant for an MRI that found signs of an old stroke. Neurology was consulted and they recommended medication management to prevent any further strokes.   You also had an echocardiogram of your heart. It found that your heart was not pumping as well as it should. Cardiology was consulted and felt that good blood pressure control and sleep apnea treatment was imperative to keep your heart as healthy as it can be.   You were started on several new medications. It will be very important for you to continue to take these as prescribed.   It is also important for you to see your family doctor next week to have your blood pressure monitored as you were started on several new medications for your blood pressure. If you begin to experience signs of light headedness, signs of fainting, nausea, fatigue, lack of concentration, you should see your doctor to have your blood pressure check. As these are signs of low blood pressure.    When you see your family doctor, please have them refer you to a cardiologist to have further evaluation completed. Also have him set you up to be evaluated and treated for your sleep apnea.   Home health will be coming out to your home to help you with your dizziness and weakness. We also recommend you see a Ears, Nose, and Throat (ENT) doctor to have your dizziness further evaluated.  POST-HOSPITAL/FOLLOW-UP CARE INSTRUCTIONS Home instructions:  1. STOP taking your Clonidine 2. Continue taking your Losartan, Carvedilol, Amlodipine, Plavix, Atorvastatin, and Lasix. 3. See a cardiologist for further  work up 4. See an ENT doctor to have your dizziness further evaluated 5. See your family doctor to have your blood pressure monitored and further evaluation of your sleep apnea 6. Continue therapy with home health  Thank you for choosing [facility]! Take care and be well!  [facility]  [place], [place] [address] [phone]

## 2018-09-24 NOTE — Progress Notes (Signed)
Physical Therapy Treatment Patient Details Name: Megan Dixon MRN: [id number] DOB: [birth year] Today's Date: 09/24/2018    History of Present Illness 52yo female c/o impaired ambulation with 1 episode NBNB emesis, intermittent chest pain, CTA negative, negative for AAA. MD now requesting vestibular evaluation. 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 No spontaneous nystagmus however she does describe aural fullness and tinnitus. Looks great from a safety standpoint as she was able to tolerate dynamic gait challenges without loss of balance. Adequate for d/c from PT standpoint when medically ready.    Follow Up Recommendations  Outpatient PT(needs vestibular follow  up!!! )     Equipment Recommendations  3in1 (PT)    Recommendations for Other Services       Precautions / Restrictions Precautions Precautions: Fall Precaution Comments: monitor for dizziness Restrictions Weight Bearing Restrictions: No    Mobility  Bed Mobility Overal bed mobility: Independent                Transfers Overall transfer level: Independent                  Ambulation/Gait Ambulation/Gait assistance: Modified independent (Device/Increase time) Gait Distance (Feet): 100 Feet Assistive device: None Gait Pattern/deviations: Step-through pattern     General Gait Details: Mod I, tolerated higher level balance challenges with gait including backwards walking, vertical head turns, quick turns, and marching. Mild instability with horizontal head turns but able to self correct.   Stairs             Wheelchair Mobility    Modified Rankin (Stroke Patients Only)       Balance Overall balance assessment: Needs assistance Sitting-balance support: Feet  supported;Bilateral upper extremity supported Sitting balance-Leahy Scale: Normal     Standing balance support: During functional activity;No upper extremity supported Standing balance-Leahy Scale: Good                              Cognition Arousal/Alertness: Awake/alert Behavior During Therapy: WFL for tasks assessed/performed Overall Cognitive Status: Within Functional Limits for tasks assessed                                        Exercises Other Exercises Other Exercises: Reviewed Nestor Lewandowsky for pt to initiate later today.  Other Exercises: Reviewed x1 viewing exercise, performed 5x/day for 1 minute ea Other Exercises: Discussed  safe home management tecniques and awareness, vestibular exercises, safety with mobility, and follow-up recommendations for vertigo.    General Comments General comments (skin integrity, edema, etc.): Negative Dix-hallpike bil, negative horizontal roll testing. Head Thrust + Rt suggesting vestibular hypofunction, +tinnitus, Negative valsalva, negative skew, spontaneous nystagmus absent.       Pertinent Vitals/Pain Pain Assessment: No/denies pain    Home Living                      Prior Function            PT Goals (current goals can now be found in the care plan section) Acute Rehab PT Goals Patient Stated Goal: Go home PT Goal Formulation: With patient Time For Goal Achievement: 10/03/18 Potential  to Achieve Goals: Good Progress towards PT goals: Progressing toward goals    Frequency    Min 4X/week      PT Plan Discharge plan needs to be updated    Co-evaluation              AM-PAC PT "6 Clicks" Daily Activity  Outcome Measure  Difficulty turning over in bed (including adjusting bedclothes, sheets and blankets)?: None Difficulty moving from lying on back to sitting on the side of the bed? : None Difficulty sitting down on and standing up from a chair with arms (e.g., wheelchair,  bedside commode, etc,.)?: None Help needed moving to and from a bed to chair (including a wheelchair)?: None Help needed walking in hospital room?: None Help needed climbing 3-5 steps with a railing? : None 6 Click Score: 24    End of Session   Activity Tolerance: Patient tolerated treatment well Patient left: with call bell/phone within reach;in bed;with family/visitor present Nurse Communication: Mobility status PT Visit Diagnosis: Unsteadiness on feet (R26.81);Muscle weakness (generalized) (M62.81);Other abnormalities of gait and mobility (R26.89);Dizziness and giddiness (R42)     Time: 1610-9604 PT Time Calculation (min) (ACUTE ONLY): 23 min  Charges:  $Neuromuscular Re-education: 8-22 mins $Self Care/Home Management: 8-22                     Elayne Snare, PT, DPT   Ellouise Newer 09/24/2018, 12:49 PM

## 2018-09-24 NOTE — Progress Notes (Signed)
Nsg Discharge Note  Admit Date:  09/18/2018 Discharge date: 09/24/2018   Megan Dixon to be D/C'd Home per MD order.  AVS completed.  Copy for chart, and copy for patient signed, and dated. Patient/caregiver able to verbalize understanding.  Discharge Medication: Allergies as of 09/24/2018      Reactions   Morphine And Related Other (See Comments)   Headache   Other Other (See Comments)   CANNOT EAT ANYTHING WITH SEEDS (DIVERTICULITIS)   Peanut-containing Drug Products Other (See Comments)   Has Diverticulitis    Toradol [ketorolac Tromethamine] Anxiety      Medication List    STOP taking these medications   acetaminophen 325 MG tablet Commonly known as:  TYLENOL   cloNIDine 0.1 MG tablet Commonly known as:  CATAPRES   HYDROcodone-acetaminophen 5-325 MG tablet Commonly known as:  NORCO/VICODIN   polyethylene glycol packet Commonly known as:  MIRALAX / GLYCOLAX     TAKE these medications   amLODipine 5 MG tablet Commonly known as:  NORVASC Take 1 tablet (5 mg total) by mouth daily. Start taking on:  09/25/2018   atorvastatin 40 MG tablet Commonly known as:  LIPITOR Take 1 tablet (40 mg total) by mouth daily.   carvedilol 6.25 MG tablet Commonly known as:  COREG Take 1 tablet (6.25 mg total) by mouth 2 (two) times daily with a meal.   clopidogrel 75 MG tablet Commonly known as:  PLAVIX Take 1 tablet (75 mg total) by mouth daily. Start taking on:  09/25/2018   cyclobenzaprine 10 MG tablet Commonly known as:  FLEXERIL Take 10 mg by mouth 3 (three) times daily as needed for muscle spasms.   FLUoxetine 20 MG capsule Commonly known as:  PROZAC Take 60 mg by mouth daily.   furosemide 20 MG tablet Commonly known as:  LASIX Take 20 mg by mouth every morning.   gabapentin 600 MG tablet Commonly known as:  NEURONTIN Take 600 mg by mouth 3 (three) times daily.   losartan 25 MG tablet Commonly known as:  COZAAR Take 1 tablet (25 mg total) by mouth  daily. Start taking on:  09/25/2018   meclizine 25 MG tablet Commonly known as:  ANTIVERT Take 1 tablet (25 mg total) by mouth 3 (three) times daily as needed for dizziness.   ondansetron 4 MG disintegrating tablet Commonly known as:  ZOFRAN-ODT Take 1 tablet (4 mg total) by mouth every 8 (eight) hours as needed for nausea or vomiting.   pantoprazole 40 MG tablet Commonly known as:  PROTONIX Take 40 mg by mouth daily.            Durable Medical Equipment  (From admission, onward)         Start     Ordered   09/24/18 1420  DME 3-in-1  Once     09/24/18 1420   09/24/18 1310  DME 3-in-1  Once     09/24/18 1312   09/20/18 0841  For home use only DME 3 n 1  Once     09/20/18 0840   09/20/18 0840  For home use only DME Walker rolling  Once    Question:  Patient needs a walker to treat with the following condition  Answer:  Gait instability   09/20/18 0840          Discharge Assessment: Vitals:   09/24/18 0513 09/24/18 0923  BP: (!) 157/95 134/86  Pulse: 89 89  Resp: 18   Temp: 98 F (36.7 C)  SpO2: 96%    Skin clean, dry and intact without evidence of skin break down, no evidence of skin tears noted. IV catheter discontinued intact. Site without signs and symptoms of complications - no redness or edema noted at insertion site, patient denies c/o pain - only slight tenderness at site.  Dressing with slight pressure applied.  D/c Instructions-Education: Discharge instructions given to patient/family with verbalized understanding. D/c education completed with patient/family including follow up instructions, medication list, d/c activities limitations if indicated, with other d/c instructions as indicated by MD - patient able to verbalize understanding, all questions fully answered. Patient instructed to return to ED, call 911, or call MD for any changes in condition.  Patient escorted via [place], and D/C home via private auto.  Megan N Cj Edgell, RN 09/24/2018 2:59 PM

## 2018-09-27 DIAGNOSIS — M6530 Trigger finger, unspecified finger: Secondary | ICD-10-CM | POA: Diagnosis not present

## 2018-09-27 DIAGNOSIS — G609 Hereditary and idiopathic neuropathy, unspecified: Secondary | ICD-10-CM | POA: Diagnosis not present

## 2018-09-27 DIAGNOSIS — H8110 Benign paroxysmal vertigo, unspecified ear: Secondary | ICD-10-CM | POA: Diagnosis not present

## 2018-09-27 DIAGNOSIS — I10 Essential (primary) hypertension: Secondary | ICD-10-CM | POA: Diagnosis not present

## 2018-09-29 ENCOUNTER — Other Ambulatory Visit: Payer: Self-pay | Admitting: Family Medicine

## 2018-09-29 MED ORDER — AMLODIPINE BESYLATE 5 MG PO TABS: 5.0000 mg | ORAL_TABLET | Freq: Every day | ORAL | 3 refills | Status: DC

## 2018-09-29 MED ORDER — CLOPIDOGREL BISULFATE 75 MG PO TABS: 75.0000 mg | ORAL_TABLET | Freq: Every day | ORAL | 3 refills | Status: DC

## 2018-09-29 MED ORDER — CARVEDILOL 6.25 MG PO TABS: 6.2500 mg | ORAL_TABLET | Freq: Two times a day (BID) | ORAL | 3 refills | Status: DC

## 2018-09-29 MED ORDER — ATORVASTATIN CALCIUM 40 MG PO TABS: 40.0000 mg | ORAL_TABLET | Freq: Every day | ORAL | 3 refills | Status: DC

## 2018-09-29 MED ORDER — LOSARTAN POTASSIUM 25 MG PO TABS: 25.0000 mg | ORAL_TABLET | Freq: Every day | ORAL | 0 refills | Status: DC

## 2018-09-29 NOTE — Progress Notes (Signed)
Family Medicine Orders Only  Prescriptions sent to wrong pharmacy on dc. [facility] sent dc med[facility] to [facility] on [address].  Guadalupe Dawn MD PGY-2 Family Medicine Resident

## 2018-10-05 DIAGNOSIS — Z8269 Family history of other diseases of the musculoskeletal system and connective tissue: Secondary | ICD-10-CM | POA: Diagnosis not present

## 2018-10-05 DIAGNOSIS — M1711 Unilateral primary osteoarthritis, right knee: Secondary | ICD-10-CM | POA: Diagnosis not present

## 2018-10-05 DIAGNOSIS — M797 Fibromyalgia: Secondary | ICD-10-CM | POA: Diagnosis not present

## 2018-10-05 DIAGNOSIS — R600 Localized edema: Secondary | ICD-10-CM | POA: Diagnosis not present

## 2018-10-05 DIAGNOSIS — M6530 Trigger finger, unspecified finger: Secondary | ICD-10-CM | POA: Diagnosis not present

## 2018-10-05 DIAGNOSIS — I10 Essential (primary) hypertension: Secondary | ICD-10-CM | POA: Diagnosis not present

## 2018-10-10 ENCOUNTER — Other Ambulatory Visit: Payer: Self-pay | Admitting: Internal Medicine

## 2018-10-10 DIAGNOSIS — R600 Localized edema: Secondary | ICD-10-CM

## 2018-10-10 DIAGNOSIS — M1711 Unilateral primary osteoarthritis, right knee: Secondary | ICD-10-CM

## 2018-10-12 DIAGNOSIS — M1711 Unilateral primary osteoarthritis, right knee: Secondary | ICD-10-CM | POA: Diagnosis not present

## 2018-10-12 DIAGNOSIS — R600 Localized edema: Secondary | ICD-10-CM | POA: Diagnosis not present

## 2018-10-18 ENCOUNTER — Encounter: Payer: Self-pay | Admitting: Cardiology

## 2018-10-18 ENCOUNTER — Ambulatory Visit (INDEPENDENT_AMBULATORY_CARE_PROVIDER_SITE_OTHER): Payer: Medicare Other | Admitting: Cardiology

## 2018-10-18 VITALS — BP 144/93 | HR 100 | Ht 61.0 in | Wt 247.8 lb

## 2018-10-18 DIAGNOSIS — I10 Essential (primary) hypertension: Secondary | ICD-10-CM

## 2018-10-18 DIAGNOSIS — I639 Cerebral infarction, unspecified: Secondary | ICD-10-CM | POA: Diagnosis not present

## 2018-10-18 DIAGNOSIS — Z79899 Other long term (current) drug therapy: Secondary | ICD-10-CM

## 2018-10-18 DIAGNOSIS — G4733 Obstructive sleep apnea (adult) (pediatric): Secondary | ICD-10-CM

## 2018-10-18 DIAGNOSIS — Z6841 Body Mass Index (BMI) 40.0 and over, adult: Secondary | ICD-10-CM | POA: Diagnosis not present

## 2018-10-18 DIAGNOSIS — Z7189 Other specified counseling: Secondary | ICD-10-CM

## 2018-10-18 DIAGNOSIS — R002 Palpitations: Secondary | ICD-10-CM | POA: Diagnosis not present

## 2018-10-18 MED ORDER — LOSARTAN POTASSIUM 50 MG PO TABS: 50.0000 mg | ORAL_TABLET | Freq: Every day | ORAL | 3 refills | Status: DC

## 2018-10-18 NOTE — Patient Instructions (Signed)
Medication Instructions:   Increase: Losartan 50 mg daily If you need a refill on your cardiac medications before your next appointment, please call your pharmacy.   Lab work: None   Testing/Procedures: Our physician has recommended that you wear an 7 DAY ZIO-PATCH monitor. The Zio patch cardiac monitor continuously records heart rhythm data for up to 14 days, this is for patients being evaluated for multiple types heart rhythms. For the first 24 hours post application, please avoid getting the Zio monitor wet in the shower or by excessive sweating during exercise. After that, feel free to carry on with regular activities. Keep soaps and lotions away from the ZIO XT Patch.   This will be placed at our [facility] location - [address].      Follow-Up: At [facility], you and your health needs are our priority.  As part of our continuing mission to provide you with exceptional heart care, we have created designated Provider Care Teams.  These Care Teams include your primary Cardiologist (physician) and Advanced Practice Providers (APPs -  Physician Assistants and Nurse Practitioners) who all work together to provide you with the care you need, when you need it. You will need a follow up appointment in 2 months.  Please call our office 2 months in advance to schedule this appointment.  You may see Buford Dresser, MD or one of the following Advanced Practice Providers on your designated Care Team:   Rosaria Ferries, PA-C . Jory Sims, DNP, ANP  Any Other Special Instructions Will Be Listed Below (If Applicable).

## 2018-10-18 NOTE — Progress Notes (Signed)
Cardiology Office Note:    Date:  10/18/2018   ID:  Megan Dixon, DOB 11-13-1966, MRN [id number]  PCP:  Audley Hose, MD  Cardiologist:  Buford Dresser, MD PhD  Referring MD: Dr. Gwyndolyn Saxon Hensel/Dr. Mina Marble (family medicine)  CC: establish outpatient cardiology care/post hospital follow up  History of Present Illness:    Megan Dixon is a 52 y.o. female with a hx of hypertension, obesity, vertigo, prior cerebellar infarction who is seen as a new consult at the request of Dr. Hensel/Dr. Tarry Kos for the evaluation and management of hypertension and palpitations.  She was recently admitted to [facility] hospital (discharge 09/24/18) for dizziness and intermittent chest pain. Her blood pressure was also poorly controlled. She was treated for central vertigo and BPPV, and her blood pressure was optimized by weaning clonidine and starting/titrating amlodipine, carvedilol, and losartan. She was also recommended to treat her OSA. She had occasional flutters during her admission, without clear arrhythmia. Recommendation was for monitor placement for further evaluation. She was also started on clopidogrel and atorvastatin for evidence of prior small CVA on imaging.  Tachycardia/palpitations: -Initial onset: several months, worse with recent vertigo episode -Frequency: multiple times daily -Duration: only seconds -Triggers: activity -Aggravating/alleviating factors: activity is better, sitting still is worse -Syncope/near syncope: no -Prior cardiac history: none -Prior ECG: NSR -Prior workup: echo in hospital -Prior treatment: none -Possible medication interactions: -Caffeine: avoids -Tobacco: former, quit in 04/2018, smoked for 40 years.  -Comorbidities: obesity -Exercise level: minimal -Cardiac ROS: no chest pain, no shortness of breath, no PND, no orthopnea, no LE edema.  Blood pressures running 140s/90-98. Still having occasional vertigo with moving her head/bending over.  No syncope. No chest pain, though palpitations (as above). Trying to work on diet/exercise for weight loss.   Past Medical History:  Diagnosis Date  . Arthritis   . Depression   . Fibromyalgia   . Hypertension   . Lupus ([place])    a. was told bloodwork was possibly positive for lupus in 08/2018 by PCP.  Marland Kitchen Morbid obesity ([place])   . MS (multiple sclerosis) ([place])    a. presumptive dx in [place], [place]  . OCD (obsessive compulsive disorder)   . OSA (obstructive sleep apnea)    a. dx recently, pending CPAP initiation.  . Palpitations   . Pneumonia 07/2018   left lung    Past Surgical History:  Procedure Laterality Date  . ABDOMINAL SURGERY    . CARPAL TUNNEL RELEASE    . HERNIA REPAIR    . JOINT REPLACEMENT    . VENTRAL HERNIA REPAIR N/A 08/09/2017   Procedure: Incarcerated ventral hernia repair with mesh ;  Surgeon: Rolm Bookbinder, MD;  Location: [facility];  Service: General;  Laterality: N/A;    Current Medications: Current Outpatient Medications on File Prior to Visit  Medication Sig  . amLODipine (NORVASC) 5 MG tablet Take 1 tablet (5 mg total) by mouth daily.  Marland Kitchen atorvastatin (LIPITOR) 40 MG tablet Take 1 tablet (40 mg total) by mouth daily.  . carvedilol (COREG) 6.25 MG tablet Take 1 tablet (6.25 mg total) by mouth 2 (two) times daily with a meal.  . clopidogrel (PLAVIX) 75 MG tablet Take 1 tablet (75 mg total) by mouth daily.  . cyclobenzaprine (FLEXERIL) 10 MG tablet Take 10 mg by mouth 3 (three) times daily as needed for muscle spasms.  Marland Kitchen FLUoxetine (PROZAC) 20 MG capsule Take 60 mg by mouth daily.  . furosemide (LASIX) 20 MG tablet Take 20 mg  by mouth every morning.  . gabapentin (NEURONTIN) 600 MG tablet Take 600 mg by mouth 3 (three) times daily.   . meclizine (ANTIVERT) 25 MG tablet Take 1 tablet (25 mg total) by mouth 3 (three) times daily as needed for dizziness.  . ondansetron (ZOFRAN ODT) 4 MG disintegrating tablet Take 1 tablet (4 mg total) by mouth every 8 (eight)  hours as needed for nausea or vomiting.  . pantoprazole (PROTONIX) 40 MG tablet Take 40 mg by mouth daily.   No current facility-administered medications on file prior to visit.      Allergies:   Morphine and related; Other; Peanut-containing drug products; and Toradol [ketorolac tromethamine]   Social History   Socioeconomic History  . Marital status: Single    Spouse name: Not on file  . Number of children: Not on file  . Years of education: Not on file  . Highest education level: Not on file  Occupational History  . Not on file  Social Needs  . Financial resource strain: Not on file  . Food insecurity:    Worry: Not on file    Inability: Not on file  . Transportation needs:    Medical: Not on file    Non-medical: Not on file  Tobacco Use  . Smoking status: Former Smoker    Types: Cigarettes    Last attempt to quit: 05/11/2018    Years since quitting: 0.4  . Smokeless tobacco: Never Used  . Tobacco comment: Smoked for 40 years, quit in 08/2018  Substance and Sexual Activity  . Alcohol use: Not Currently  . Drug use: Never  . Sexual activity: Not on file  Lifestyle  . Physical activity:    Days per week: Not on file    Minutes per session: Not on file  . Stress: Not on file  Relationships  . Social connections:    Talks on phone: Not on file    Gets together: Not on file    Attends religious service: Not on file    Active member of club or organization: Not on file    Attends meetings of clubs or organizations: Not on file    Relationship status: Not on file  Other Topics Concern  . Not on file  Social History Narrative  . Not on file     Family History: The patient's family history includes Heart attack in her brother; Lung cancer in her mother; Seizures in her brother.  ROS:   Please see the history of present illness.  Additional pertinent ROS:  Constitutional: Negative for chills, fever, night sweats, unintentional weight loss  HENT: Negative for ear  pain and hearing loss.   Eyes: Negative for loss of vision and eye pain.  Respiratory: Negative for cough, sputum, shortness of breath, wheezing.   Cardiovascular: Positive for palpitations. Negative for chest pain, PND, orthopnea, lower extremity edema and claudication.  Gastrointestinal: Negative for abdominal pain, melena, and hematochezia.  Genitourinary: Negative for dysuria and hematuria.  Musculoskeletal: Negative for falls and myalgias.  Skin: Negative for itching and rash.  Neurological: Negative for focal weakness, focal sensory changes and loss of consciousness. Positive for vertigo Endo/Heme/Allergies: Does not bruise/bleed easily.    EKGs/Labs/Other Studies Reviewed:    The following studies were reviewed today: Recent hospitalization/discharge summary  Echo 09/20/18 Study Conclusions - Left ventricle: The cavity size was mildly dilated. Wall   thickness was increased in a pattern of mild LVH. Systolic   function was mildly reduced. The estimated ejection  fraction was   in the range of 45% to 50%. Diffuse hypokinesis. Doppler   parameters are consistent with abnormal left ventricular   relaxation (grade 1 diastolic dysfunction).  Impressions: - Mild global reduction in LV systolic function (EF 50); mild LVE;   mild LVH; mild diastolic dysfunction.  EKG:  EKG is ordered today.  The ekg ordered today demonstrates normal sinus rhythm  Recent Labs: 08/03/2018: Magnesium 2.0 09/18/2018: ALT 16; BUN 8; Creatinine, Ser 0.78; Potassium 4.0; Sodium 140 09/19/2018: Hemoglobin 14.5; Platelets 200 09/21/2018: TSH 0.737  Recent Lipid Panel    Component Value Date/Time   CHOL 205 (H) 09/18/2018 1850   TRIG 129 09/18/2018 1850   HDL 38 (L) 09/18/2018 1850   CHOLHDL 5.4 09/18/2018 1850   VLDL 26 09/18/2018 1850   LDLCALC 141 (H) 09/18/2018 1850    Physical Exam:    VS:  BP (!) 144/93   Pulse 100   Ht 5\' 1"  (1.549 m)   Wt 247 lb 12.8 oz (112.4 kg)   BMI 46.82 kg/m       Wt Readings from Last 3 Encounters:  10/18/18 247 lb 12.8 oz (112.4 kg)  09/18/18 244 lb (110.7 kg)  08/01/18 240 lb (108.9 kg)     GEN: Well nourished, well developed in no acute distress HEENT: Normal NECK: No JVD; No carotid bruits LYMPHATICS: No lymphadenopathy CARDIAC: regular rhythm, normal S1 and S2, no murmurs, rubs, gallops. Radial and DP pulses 2+ bilaterally. RESPIRATORY:  Clear to auscultation without rales, wheezing or rhonchi  ABDOMEN: Soft, non-tender, non-distended MUSCULOSKELETAL:  No edema; No deformity  SKIN: Warm and dry NEUROLOGIC:  Alert and oriented x 3 PSYCHIATRIC:  Normal affect   ASSESSMENT:    1. Essential hypertension   2. Palpitation   3. OSA (obstructive sleep apnea)   4. Counseling on health promotion and disease prevention   5. Medication management   6. Class 3 severe obesity due to excess calories without serious comorbidity with body mass index (BMI) of 45.0 to 49.9 in adult [facility])    PLAN:    1. Hypertension: not at goal today. Tolerating medications well overall  -continue amlodipine 5 mg, carvedilol 6.25 mg BID  -increase losartan from 25 to 50 mg daily  -continue furosemide daily, as this has improved her swelling significantly  2. Palpitations: unclear etiology. We discussed at length that sometimes these palpitations can clearly be tied to arrhythmias, but sometimes they are not. -ordered 7 day Zio monitor  3. OSA: Reports being diagnosed with sleep apnea recently, awaiting CPAP titration  4. Prevention: -recommend heart healthy/Mediterranean diet, with whole grains, fruits, vegetable, fish, lean meats, nuts, and olive oil. Limit salt. -recommend moderate walking, 3-5 times/week for 30-50 minutes each session. Aim for at least 150 minutes.week. Goal should be pace of 3 miles/hours, or walking 1.5 miles in 30 minutes -recommend avoidance of tobacco products. Avoid excess alcohol. -Additional risk factor control:  -Diabetes: A1c is  5.8  -Lipids: LDL 141, HDL 38, Tchol 205, TG 129. On atorvastatin 40 mg, started in hospital. Recheck lipids next visit  -Blood pressure control: as above.  -Weight: BMI 46.8, class 3 severe obesity, working on weight loss. 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  Concerns regarding medicines are outlined above.  Orders Placed This Encounter  Procedures  . LONG TERM MONITOR (3-14 DAYS)  . EKG 12-Lead   Meds ordered this encounter  Medications  . losartan (COZAAR) 50 MG tablet    Sig: Take 1 tablet (50 mg total) by mouth daily.    Dispense:  90 tablet    Refill:  3    Patient Instructions  Medication Instructions:   Increase: Losartan 50 mg daily If you need a refill on your cardiac medications before your next appointment, please call your pharmacy.   Lab work: None   Testing/Procedures: Our physician has recommended that you wear an 7 DAY ZIO-PATCH monitor. The Zio patch cardiac monitor continuously records heart rhythm data for up to 14 days, this is for patients being evaluated for multiple types heart rhythms. For the first 24 hours post application, please avoid getting the Zio monitor wet in the shower or by excessive sweating during exercise. After that, feel free to carry on with regular activities. Keep soaps and lotions away from the ZIO XT Patch.   This will be placed at our [facility] location - [address],  [address].      Follow-Up: At [facility], you and your health needs are our priority.  As part of our continuing mission to provide you with exceptional heart care, we have created designated Provider Care Teams.  These Care Teams include your primary Cardiologist (physician) and Advanced Practice Providers (APPs -  Physician Assistants and Nurse Practitioners) who all work together to provide you with the care you need, when you need it. You will need a follow up appointment in 2 months.  Please call our office 2 months in advance to schedule this appointment.  You may see Buford Dresser, MD or one of the following Advanced Practice Providers on your designated Care Team:   Rosaria Ferries, PA-C . Jory Sims, DNP, ANP  Any Other Special Instructions Will Be Listed Below (If Applicable).       Signed, Buford Dresser, MD PhD 10/18/2018 11:00 AM    [place]

## 2018-10-24 DIAGNOSIS — M65332 Trigger finger, left middle finger: Secondary | ICD-10-CM | POA: Diagnosis not present

## 2018-10-24 DIAGNOSIS — M1711 Unilateral primary osteoarthritis, right knee: Secondary | ICD-10-CM | POA: Diagnosis not present

## 2018-11-15 ENCOUNTER — Ambulatory Visit (INDEPENDENT_AMBULATORY_CARE_PROVIDER_SITE_OTHER): Payer: Medicare Other

## 2018-11-15 DIAGNOSIS — R002 Palpitations: Secondary | ICD-10-CM | POA: Diagnosis not present

## 2018-11-28 ENCOUNTER — Emergency Department (HOSPITAL_COMMUNITY)
Admission: EM | Admit: 2018-11-28 | Discharge: 2018-11-28 | Disposition: A | Discharge status: Home / Self Care | Payer: Medicare Other | Attending: Emergency Medicine | Admitting: Emergency Medicine

## 2018-11-28 ENCOUNTER — Encounter (HOSPITAL_COMMUNITY): Payer: Self-pay | Admitting: Emergency Medicine

## 2018-11-28 ENCOUNTER — Other Ambulatory Visit: Payer: Self-pay

## 2018-11-28 DIAGNOSIS — K529 Noninfective gastroenteritis and colitis, unspecified: Secondary | ICD-10-CM

## 2018-11-28 DIAGNOSIS — G35 Multiple sclerosis: Secondary | ICD-10-CM | POA: Diagnosis not present

## 2018-11-28 DIAGNOSIS — R002 Palpitations: Secondary | ICD-10-CM | POA: Diagnosis not present

## 2018-11-28 DIAGNOSIS — I10 Essential (primary) hypertension: Secondary | ICD-10-CM | POA: Insufficient documentation

## 2018-11-28 DIAGNOSIS — Z7902 Long term (current) use of antithrombotics/antiplatelets: Secondary | ICD-10-CM | POA: Insufficient documentation

## 2018-11-28 DIAGNOSIS — Z91010 Allergy to peanuts: Secondary | ICD-10-CM | POA: Diagnosis not present

## 2018-11-28 DIAGNOSIS — Z87891 Personal history of nicotine dependence: Secondary | ICD-10-CM | POA: Diagnosis not present

## 2018-11-28 DIAGNOSIS — Z79899 Other long term (current) drug therapy: Secondary | ICD-10-CM | POA: Insufficient documentation

## 2018-11-28 DIAGNOSIS — M5442 Lumbago with sciatica, left side: Secondary | ICD-10-CM | POA: Insufficient documentation

## 2018-11-28 LAB — COMPREHENSIVE METABOLIC PANEL
ALT: 25 U/L (ref 0–44)
AST: 25 U/L (ref 15–41)
Albumin: 3.6 g/dL (ref 3.5–5.0)
Alkaline Phosphatase: 108 U/L (ref 38–126)
Anion gap: 10 (ref 5–15)
BUN: 13 mg/dL (ref 6–20)
CO2: 28 mmol/L (ref 22–32)
Calcium: 8.9 mg/dL (ref 8.9–10.3)
Chloride: 102 mmol/L (ref 98–111)
Creatinine, Ser: 0.88 mg/dL (ref 0.44–1.00)
GFR calc Af Amer: >60 mL/min (ref >60)
GFR calc non Af Amer: >60 mL/min (ref >60)
Glucose, Bld: 87 mg/dL (ref 70–99)
Potassium: 3.5 mmol/L (ref 3.5–5.1)
Sodium: 140 mmol/L (ref 135–145)
Total Bilirubin: 0.4 mg/dL (ref 0.3–1.2)
Total Protein: 7.4 g/dL (ref 6.5–8.1)

## 2018-11-28 LAB — CBC WITH DIFFERENTIAL/PLATELET
Abs Immature Granulocytes: 0.03 10*3/uL (ref 0.00–0.07)
Basophils Absolute: 0.1 10*3/uL (ref 0.0–0.1)
Basophils Relative: 1 %
Eosinophils Absolute: 0.1 10*3/uL (ref 0.0–0.5)
Eosinophils Relative: 1 %
HCT: 46.4 % — ABNORMAL HIGH (ref 36.0–46.0)
Hemoglobin: 14.4 g/dL (ref 12.0–15.0)
Immature Granulocytes: 0 %
Lymphocytes Relative: 25 %
Lymphs Abs: 2.4 10*3/uL (ref 0.7–4.0)
MCH: 27.7 pg (ref 26.0–34.0)
MCHC: 31 g/dL (ref 30.0–36.0)
MCV: 89.4 fL (ref 80.0–100.0)
Monocytes Absolute: 0.7 10*3/uL (ref 0.1–1.0)
Monocytes Relative: 7 %
Neutro Abs: 6.4 10*3/uL (ref 1.7–7.7)
Neutrophils Relative %: 66 %
Platelets: 204 10*3/uL (ref 150–400)
RBC: 5.19 MIL/uL — ABNORMAL HIGH (ref 3.87–5.11)
RDW: 14.5 % (ref 11.5–15.5)
WBC: 9.7 10*3/uL (ref 4.0–10.5)
nRBC: 0 % (ref 0.0–0.2)

## 2018-11-28 LAB — URINALYSIS, ROUTINE W REFLEX MICROSCOPIC
Bilirubin Urine: NEGATIVE
Glucose, UA: NEGATIVE mg/dL
Hgb urine dipstick: NEGATIVE
Ketones, ur: NEGATIVE mg/dL
Leukocytes, UA: NEGATIVE
Nitrite: NEGATIVE
Protein, ur: NEGATIVE mg/dL
Specific Gravity, Urine: 1.019 (ref 1.005–1.030)
pH: 5 (ref 5.0–8.0)

## 2018-11-28 LAB — I-STAT TROPONIN, ED: Troponin i, poc: 0.01 ng/mL (ref 0.00–0.08)

## 2018-11-28 LAB — LIPASE, BLOOD: Lipase: 29 U/L (ref 11–51)

## 2018-11-28 MED ORDER — ONDANSETRON HCL 4 MG/2ML IJ SOLN
4.0000 mg | Freq: Once | INTRAMUSCULAR | Status: AC
  Administered 2018-11-28: 4 mg via INTRAVENOUS
  Filled 2018-11-28: qty 2

## 2018-11-28 MED ORDER — HYDROCODONE-ACETAMINOPHEN 5-325 MG PO TABS
2.0000 | ORAL_TABLET | Freq: Once | ORAL | Status: AC
  Administered 2018-11-28: 2 via ORAL
  Filled 2018-11-28: qty 2

## 2018-11-28 MED ORDER — SODIUM CHLORIDE 0.9 % IV BOLUS
1000.0000 mL | Freq: Once | INTRAVENOUS | Status: AC
  Administered 2018-11-28: 1000 mL via INTRAVENOUS

## 2018-11-28 NOTE — ED Provider Notes (Signed)
[facility] EMERGENCY DEPARTMENT Provider Note   CSN: [id number] Arrival date & time: 11/28/18  6720     History   Chief Complaint Chief Complaint  Patient presents with  . Palpitations  . Emesis    HPI Megan Dixon is a 52 y.o. female.  Patient is a 52 year old female with history of fibromyalgia, possible lupus, obesity, obstructive sleep apnea, hypertension.  She presents today for evaluation of nausea, vomiting, diarrhea, weakness, dizziness, and palpitations.  This is been ongoing for the past several days.  She denies any bloody or black stool.  She denies fevers or chills.  She denies ill contacts.  The history is provided by the patient.  Palpitations   This is a new problem. Episode onset: Several days ago. The problem occurs constantly. The problem has been gradually worsening. Associated symptoms include malaise/fatigue, dizziness and weakness. Pertinent negatives include no chest pain and no chest pressure. She has tried nothing for the symptoms.    Past Medical History:  Diagnosis Date  . Arthritis   . Depression   . Fibromyalgia   . Hypertension   . Lupus ([place])    a. was told bloodwork was possibly positive for lupus in 08/2018 by PCP.  Marland Kitchen Morbid obesity ([place])   . MS (multiple sclerosis) ([place])    a. presumptive dx in [place], [place]  . OCD (obsessive compulsive disorder)   . OSA (obstructive sleep apnea)    a. dx recently, pending CPAP initiation.  . Palpitations   . Pneumonia 07/2018   left lung    Patient Active Problem List   Diagnosis Date Noted  . Morbid obesity ([place])   . Cerebellar infarction ([place])   . Vertigo 09/18/2018  . MS (multiple sclerosis) ([place]) 08/01/2018  . Hypertension 08/01/2018  . Fibromyalgia 08/01/2018  . Blood-tinged sputum 08/01/2018  . Community acquired pneumonia of left lung   . Incarcerated incisional hernia 08/10/2017  . S/P exploratory laparotomy 08/09/2017    Past Surgical History:  Procedure  Laterality Date  . ABDOMINAL SURGERY    . CARPAL TUNNEL RELEASE    . HERNIA REPAIR    . JOINT REPLACEMENT    . VENTRAL HERNIA REPAIR N/A 08/09/2017   Procedure: Incarcerated ventral hernia repair with mesh ;  Surgeon: Rolm Bookbinder, MD;  Location: [facility];  Service: General;  Laterality: N/A;     OB History   No obstetric history on file.      Home Medications    Prior to Admission medications   Medication Sig Start Date End Date Taking? Authorizing Provider  amLODipine (NORVASC) 5 MG tablet Take 1 tablet (5 mg total) by mouth daily. 09/25/18   Mullis, Kiersten P, DO  atorvastatin (LIPITOR) 40 MG tablet Take 1 tablet (40 mg total) by mouth daily. 09/24/18   Mullis, Kiersten P, DO  carvedilol (COREG) 6.25 MG tablet Take 1 tablet (6.25 mg total) by mouth 2 (two) times daily with a meal. 09/24/18   Mullis, Kiersten P, DO  clopidogrel (PLAVIX) 75 MG tablet Take 1 tablet (75 mg total) by mouth daily. 09/25/18   Mullis, Kiersten P, DO  cyclobenzaprine (FLEXERIL) 10 MG tablet Take 10 mg by mouth 3 (three) times daily as needed for muscle spasms.    [provider]  FLUoxetine (PROZAC) 20 MG capsule Take 60 mg by mouth daily.    [provider]  furosemide (LASIX) 20 MG tablet Take 20 mg by mouth every morning. 09/07/18   [provider]  gabapentin (NEURONTIN) 600 MG tablet Take 600 mg by mouth 3 (three) times daily.     [provider]  losartan (COZAAR) 50 MG tablet Take 1 tablet (50 mg total) by mouth daily. 10/18/18   Buford Dresser, MD  meclizine (ANTIVERT) 25 MG tablet Take 1 tablet (25 mg total) by mouth 3 (three) times daily as needed for dizziness. 02/11/18   Harris, Vernie Shanks, PA-C  ondansetron (ZOFRAN ODT) 4 MG disintegrating tablet Take 1 tablet (4 mg total) by mouth every 8 (eight) hours as needed for nausea or vomiting. 08/13/17   Kinsinger, Arta Bruce, MD  pantoprazole (PROTONIX) 40 MG tablet Take 40 mg by mouth daily.    [provider]    Family History Family History  Problem Relation Age of Onset  . Lung cancer Mother   . Seizures Brother   . Heart attack Brother        Happened during seizure    Social History Social History   Tobacco Use  . Smoking status: Former Smoker    Types: Cigarettes    Last attempt to quit: 05/11/2018    Years since quitting: 0.5  . Smokeless tobacco: Never Used  . Tobacco comment: Smoked for 40 years, quit in 08/2018  Substance Use Topics  . Alcohol use: Not Currently  . Drug use: Never     Allergies   Morphine and related; Other; Peanut-containing drug products; and Toradol [ketorolac tromethamine]   Review of Systems Review of Systems  Constitutional: Positive for malaise/fatigue.  Cardiovascular: Positive for palpitations. Negative for chest pain.  Neurological: Positive for dizziness and weakness.  All other systems reviewed and are negative.    Physical Exam Updated Vital Signs BP (!) 146/93 (BP Location: Right Arm)   Pulse 88   Temp 97.8 F (36.6 C) (Oral)   Resp 16   Ht 5\' 1"  (1.549 m)   Wt 104.3 kg   SpO2 98%   BMI 43.46 kg/m   Physical Exam Vitals signs and nursing note reviewed.  Constitutional:      General: She is not in acute distress.    Appearance: She is well-developed. She is not diaphoretic.  HENT:     Head: Normocephalic and atraumatic.     Mouth/Throat:     Mouth: Mucous membranes are moist.     Pharynx: Oropharynx is clear.  Eyes:     Extraocular Movements: Extraocular movements intact.     Pupils: Pupils are equal, round, and reactive to light.  Neck:     Musculoskeletal: Normal range of motion and neck supple.  Cardiovascular:     Rate and Rhythm: Normal rate and regular rhythm.     Heart sounds: No murmur. No friction rub. No gallop.   Pulmonary:     Effort: Pulmonary effort is normal. No respiratory distress.     Breath sounds: Normal breath sounds. No wheezing.  Abdominal:     General: Bowel sounds are  normal. There is no distension.     Palpations: Abdomen is soft.     Tenderness: There is no abdominal tenderness.  Musculoskeletal: Normal range of motion.  Skin:    General: Skin is warm and dry.  Neurological:     General: No focal deficit present.     Mental Status: She is alert and oriented to person, place, and time.     Cranial Nerves: No cranial nerve deficit.     Coordination: Coordination normal.      ED Treatments / Results  Labs (all labs ordered are listed, but only abnormal results are displayed) Labs Reviewed - No data to display  EKG EKG Interpretation  Date/Time:  Monday November 28 2018 08:50:49 EST Ventricular Rate:  88 PR Interval:    QRS Duration: 96 QT Interval:  425 QTC Calculation: 515 R Axis:   56 Text Interpretation:  Sinus rhythm Prolonged QT interval Confirmed by Veryl Speak [id number]) on 11/28/2018 8:56:27 AM   Radiology No results found.  Procedures Procedures (including critical care time)  Medications Ordered in ED Medications - No data to display   Initial Impression / Assessment and Plan / ED Course  I have reviewed the triage vital signs and the nursing notes.  Pertinent labs & imaging results that were available during my care of the patient were reviewed by me and considered in my medical decision making (see chart for details).  Patient with history of fibromyalgia presenting with complaints of back pain, palpitations, and nausea, vomiting, diarrhea.  I highly suspect a viral etiology that is behind all of these symptoms.  Her work-up is unremarkable including laboratory studies.  She has maintained a sinus rhythm throughout her emergency department course.  She was given normal saline and pain medicine and is now feeling better.  She will be discharged and is to follow up with her primary doctor on Wednesday as scheduled.  Final Clinical Impressions(s) / ED Diagnoses   Final diagnoses:  None    ED Discharge Orders    None         Veryl Speak, MD 11/28/18 1302

## 2018-11-28 NOTE — Discharge Instructions (Addendum)
Tylenol 1000 mg every six hours as needed for pain or fever.  Follow-up with your primary doctor if your symptoms are not improving in the next week.

## 2018-11-28 NOTE — ED Triage Notes (Signed)
Pt arrives to ED from home with complaints of heart palpitations, vomiting, fevers, and chills since Friday. Pt reports loose stools as well. Pt placed in position of comfort with bed locked and lowered, call bell in reach.

## 2018-11-29 DIAGNOSIS — R002 Palpitations: Secondary | ICD-10-CM | POA: Diagnosis not present

## 2018-11-30 DIAGNOSIS — M6530 Trigger finger, unspecified finger: Secondary | ICD-10-CM | POA: Diagnosis not present

## 2018-11-30 DIAGNOSIS — M1711 Unilateral primary osteoarthritis, right knee: Secondary | ICD-10-CM | POA: Diagnosis not present

## 2018-11-30 DIAGNOSIS — M797 Fibromyalgia: Secondary | ICD-10-CM | POA: Diagnosis not present

## 2018-11-30 DIAGNOSIS — I10 Essential (primary) hypertension: Secondary | ICD-10-CM | POA: Diagnosis not present

## 2018-11-30 DIAGNOSIS — Z8269 Family history of other diseases of the musculoskeletal system and connective tissue: Secondary | ICD-10-CM | POA: Diagnosis not present

## 2018-11-30 DIAGNOSIS — G4733 Obstructive sleep apnea (adult) (pediatric): Secondary | ICD-10-CM | POA: Diagnosis not present

## 2018-11-30 DIAGNOSIS — R002 Palpitations: Secondary | ICD-10-CM | POA: Diagnosis not present

## 2018-11-30 DIAGNOSIS — R600 Localized edema: Secondary | ICD-10-CM | POA: Diagnosis not present

## 2018-12-20 ENCOUNTER — Ambulatory Visit: Payer: Medicare Other | Admitting: Cardiology

## 2018-12-21 ENCOUNTER — Other Ambulatory Visit: Payer: Self-pay | Admitting: Family Medicine

## 2018-12-24 ENCOUNTER — Other Ambulatory Visit: Payer: Self-pay | Admitting: Family Medicine

## 2018-12-26 ENCOUNTER — Encounter: Payer: Self-pay | Admitting: Cardiology

## 2018-12-26 DIAGNOSIS — M797 Fibromyalgia: Secondary | ICD-10-CM | POA: Diagnosis not present

## 2018-12-26 DIAGNOSIS — I639 Cerebral infarction, unspecified: Secondary | ICD-10-CM | POA: Diagnosis not present

## 2018-12-26 DIAGNOSIS — R768 Other specified abnormal immunological findings in serum: Secondary | ICD-10-CM | POA: Diagnosis not present

## 2019-02-17 IMAGING — MR MR HEAD W/O CM
9 of 10 series · 36 of 48 positions shown · non-contrast
Comparison: Brain MRI and CTA head and neck 02/11/2018.

CLINICAL DATA: 52-year-old female with dizziness for 2 days.
Blurred vision, severe pressure in the head and chest.

EXAM:
MRI HEAD WITHOUT CONTRAST
TECHNIQUE: Multiplanar, multiecho pulse sequences of the brain and surrounding
structures were obtained without intravenous contrast.

[Series 3: DWI · coronal · 4.0mm · 0.94mm/px · 8 of 72 slices shown (1 of 2)]
[im 1/72]
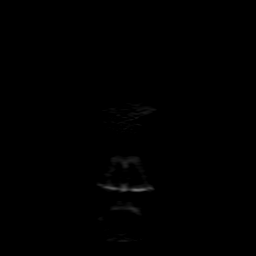
[im 11/72]
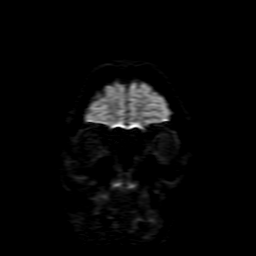
[im 21/72]
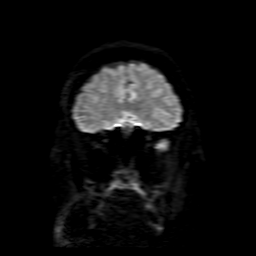
[im 31/72]
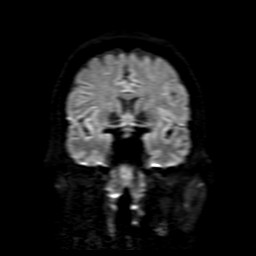
[im 41/72]
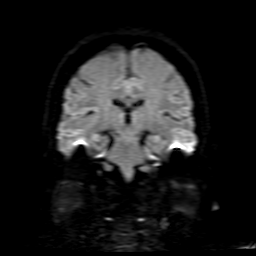
[im 51/72]
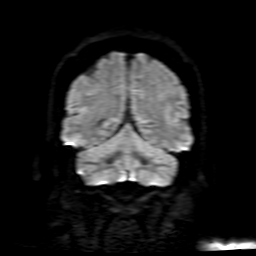
[im 61/72]
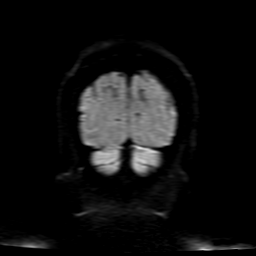
[im 72/72]
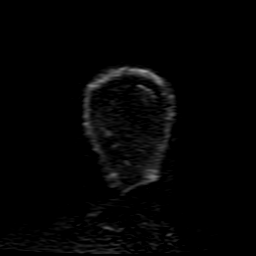

[Series 4: DWI · axial · 3.0mm · 0.94mm/px · z∈[-93,+53]mm · 9 of 100 slices shown (2 of 2)]
[im 1/100]
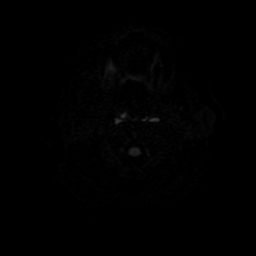
[im 13/100]
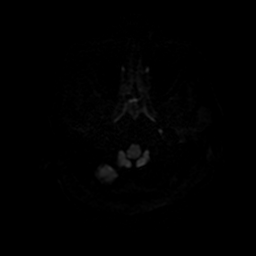
[im 25/100]
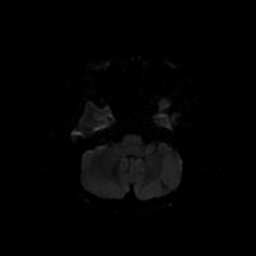
[im 38/100]
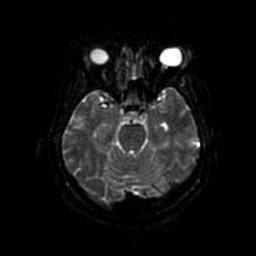
[im 50/100]
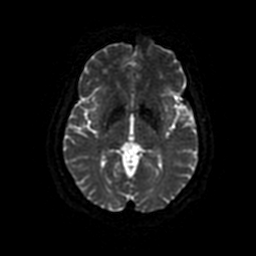
[im 62/100]
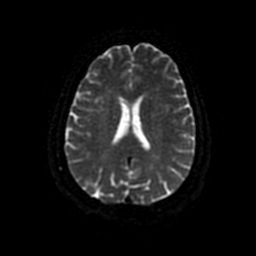
[im 75/100]
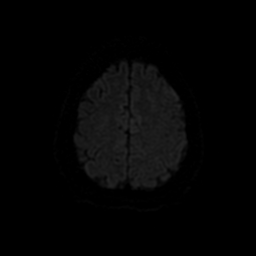
[im 87/100]
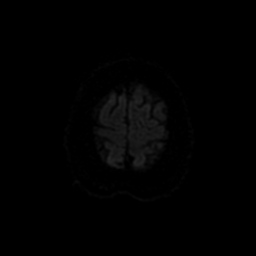
[im 100/100]
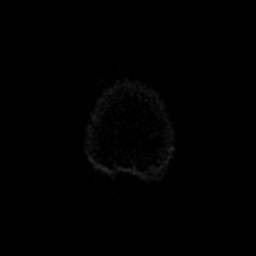

[Series 5: FLAIR · sagittal · 5.0mm · 0.47mm/px · 2 of 26 slices shown (1 of 2)]
[im 1/26]
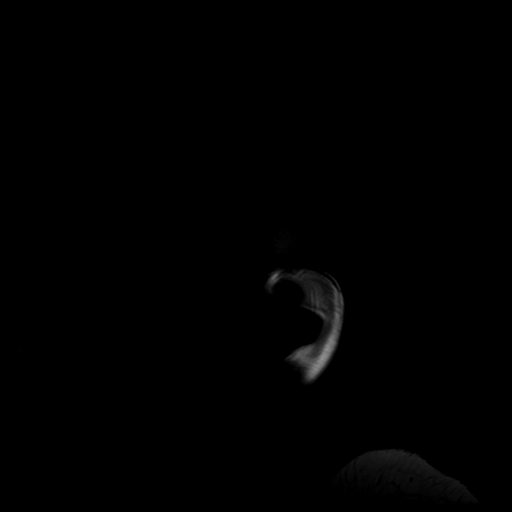
[im 26/26]
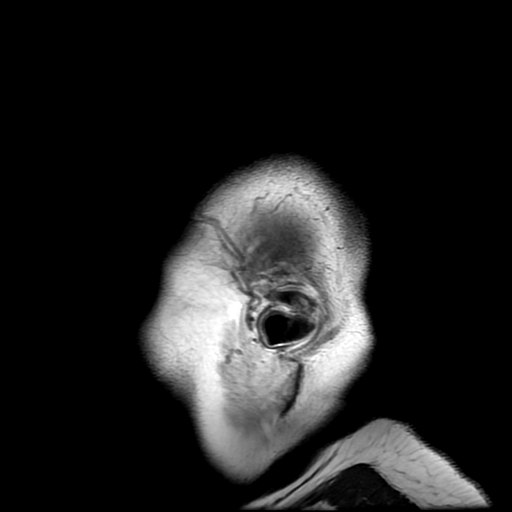

[Series 7: T2 · axial · 5.0mm · 0.47mm/px · z∈[-92,+51]mm · 2 of 25 slices shown]
[im 1/25]
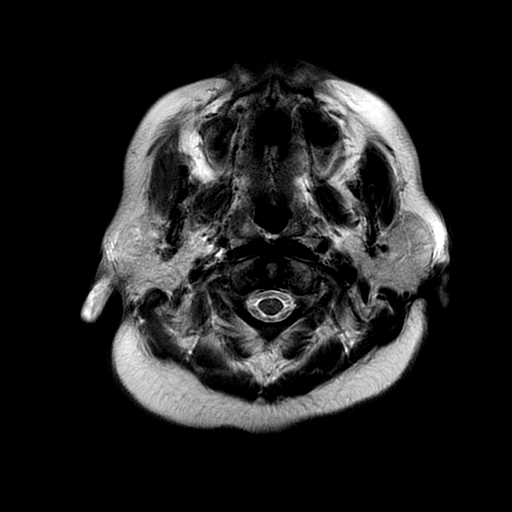
[im 25/25]
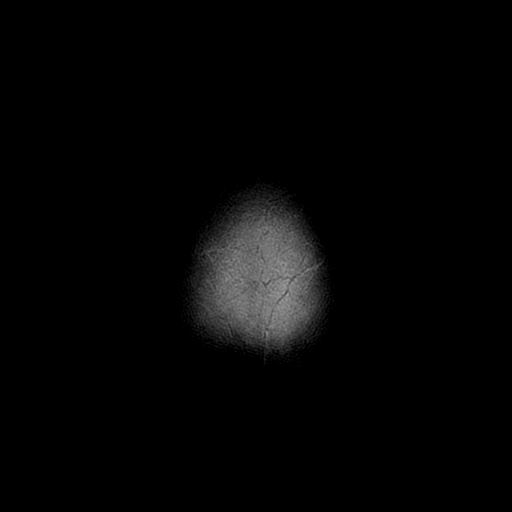

[Series 8: FLAIR · axial · 3.0mm · 0.47mm/px · z∈[-92,+51]mm · 2 of 25 slices shown (2 of 2)]
[im 1/25]
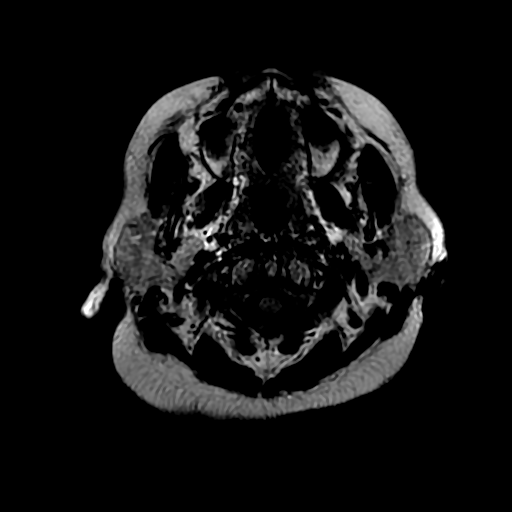
[im 25/25]
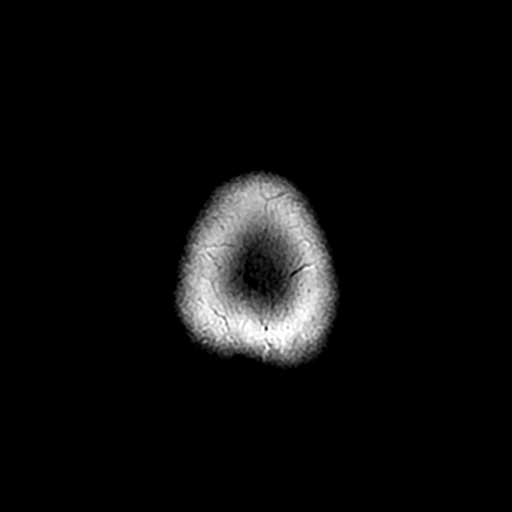

[Series 9: (person_name) · axial · 3.0mm · 0.47mm/px · z∈[-93,-75]mm · 2 of 100 slices shown]
[im 1/100]
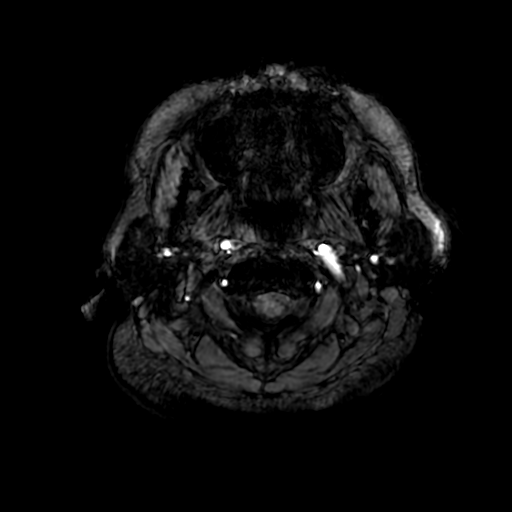
[im 13/100]
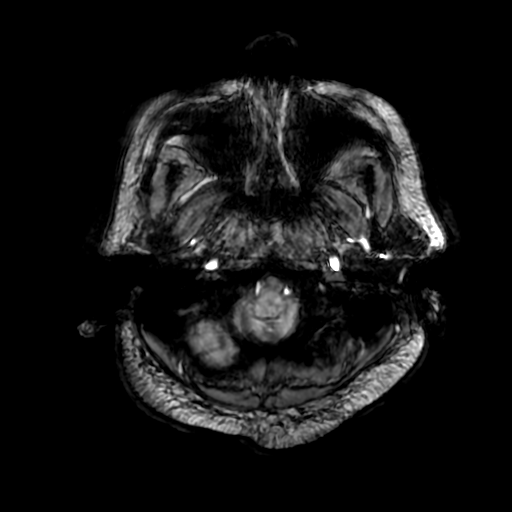

[Series 11: T2 post-contrast · coronal · 5.0mm · 0.47mm/px · 3 of 30 slices shown]
[im 1/30]
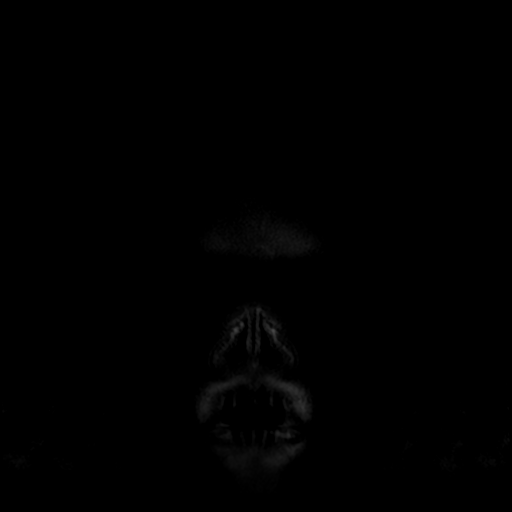
[im 15/30]
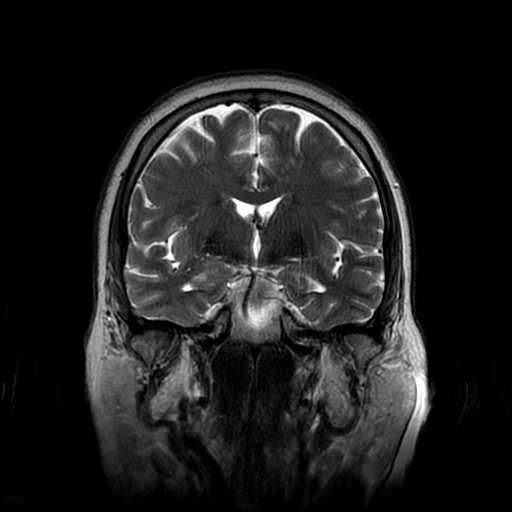
[im 30/30]
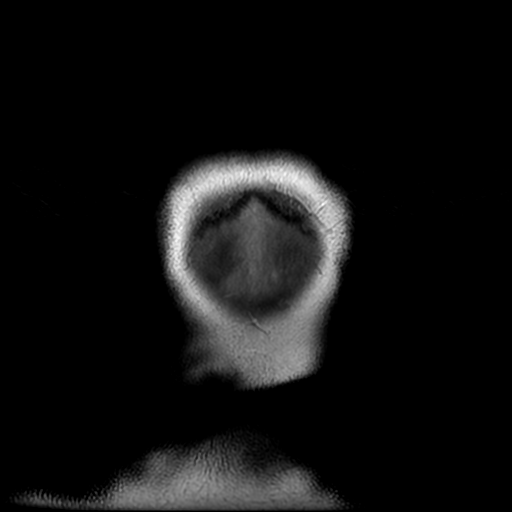

[Series 350: ADC · coronal · 4.0mm · 0.94mm/px · 3 of 36 slices shown (1 of 2)]
[im 1/36]
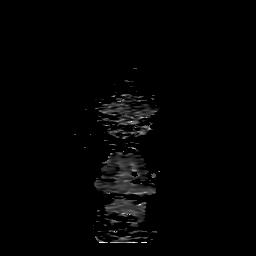
[im 18/36]
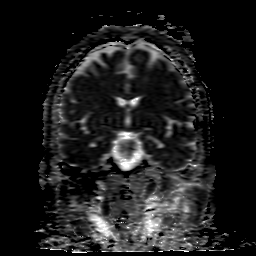
[im 36/36]
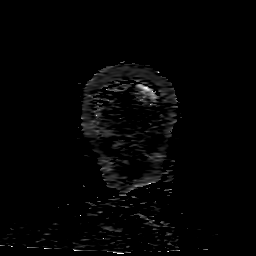

[Series 450: ADC · axial · 3.0mm · 0.94mm/px · z∈[-93,+53]mm · 5 of 50 slices shown (2 of 2)]
[im 1/50]
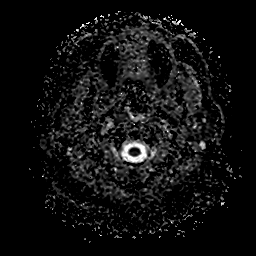
[im 13/50]
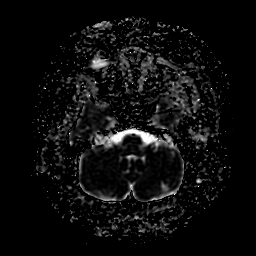
[im 25/50]
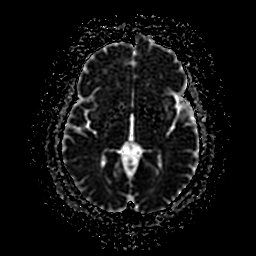
[im 37/50]
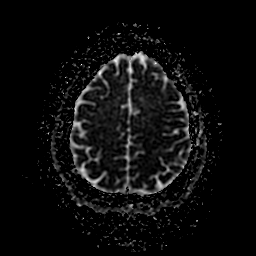
[im 50/50]
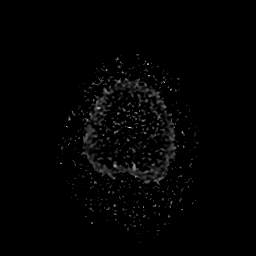

[36 of 48 positions shown; findings below may reference images not displayed]

FINDINGS: Brain:

A small chronic infarct in the left cerebellum is chronic but new
since 02/11/2018 (series 7, image 7). No restricted diffusion or
evidence of acute infarction.

Signal in the brainstem and deep gray matter nuclei remains normal.
Scattered small cerebral white matter T2 and FLAIR hyperintense foci
appear stable and are in a nonspecific configuration. No cortical
encephalomalacia or chronic cerebral blood products.

No midline shift, mass effect, evidence of mass lesion,
ventriculomegaly, extra-axial collection or acute intracranial
hemorrhage. Cervicomedullary junction and pituitary are within
normal limits.

Vascular: Major intracranial vascular flow voids are stable and
preserved.

Skull and upper cervical spine: Negative visible cervical spine.
Normal bone marrow signal.

Sinuses/Orbits: Stable and negative.

Other: Stable adenoid hypertrophy. Mastoids remain clear. Visible
internal auditory structures appear normal. Scalp and face soft
tissues appear negative.
IMPRESSION: 1. There is no acute infarct or acute intracranial abnormality, but
a small left cerebellar infarct is new since the February 2018 MRI.
2. Stable MRI appearance the brain otherwise, with moderate for age
nonspecific white matter signal changes.

## 2019-02-17 IMAGING — CT CT ANGIO NECK
2 of 12 series · 6 of 36 positions shown · IV contrast (iopamidol)
Comparison: None.

Brain MRI 09/18/2018. Head and neck CTA 02/11/2018.

CLINICAL DATA: Dizziness with difficulty walking.

EXAM:
CT ANGIOGRAPHY HEAD AND NECK
TECHNIQUE: Multidetector CT imaging of the head and neck was performed using
the standard protocol during bolus administration of intravenous
contrast. Multiplanar CT image reconstructions and MIPs were
obtained to evaluate the vascular anatomy. Carotid stenosis
measurements (when applicable) are obtained utilizing NASCET
criteria, using the distal internal carotid diameter as the
denominator.
CONTRAST:  50mL 1QX0LU-2YW IOPAMIDOL (1QX0LU-2YW) INJECTION 76%

[Series 11: cta neck axial · axial · 0.39mm/px · z∈[-331,-96]mm · 5 of 356 slices shown]
[im 60/356  soft-tissue]
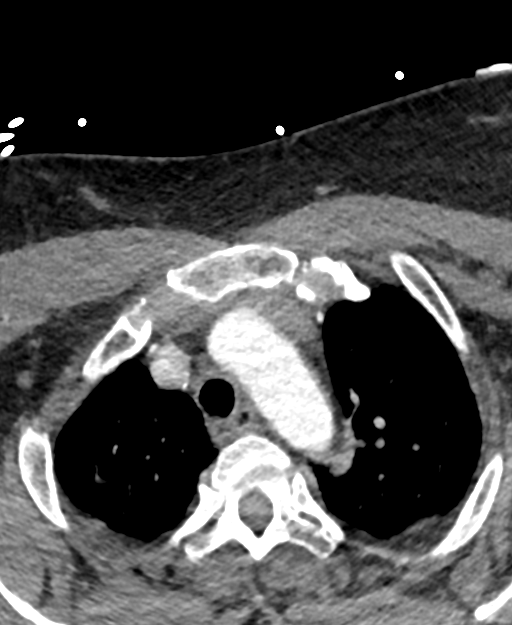
[im 119/356  bone]
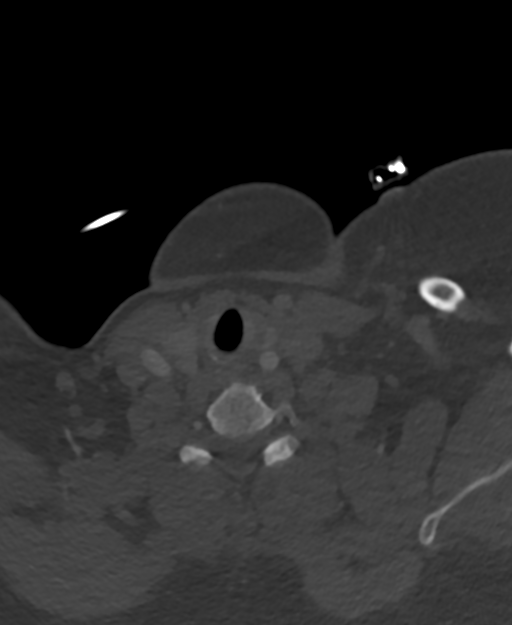
[im 178/356  soft-tissue]
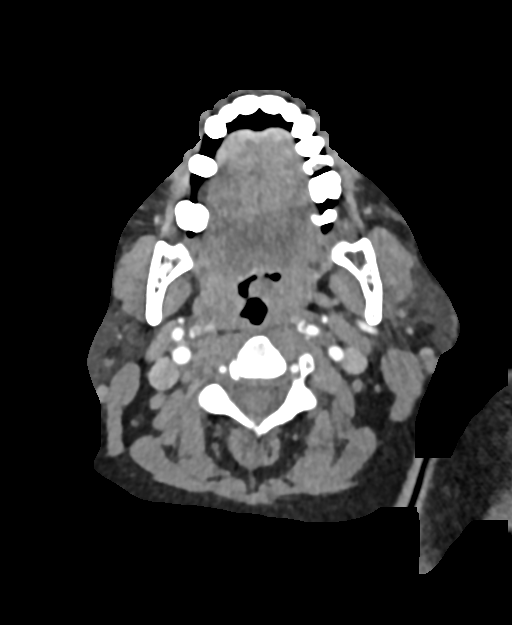
[im 237/356  bone]
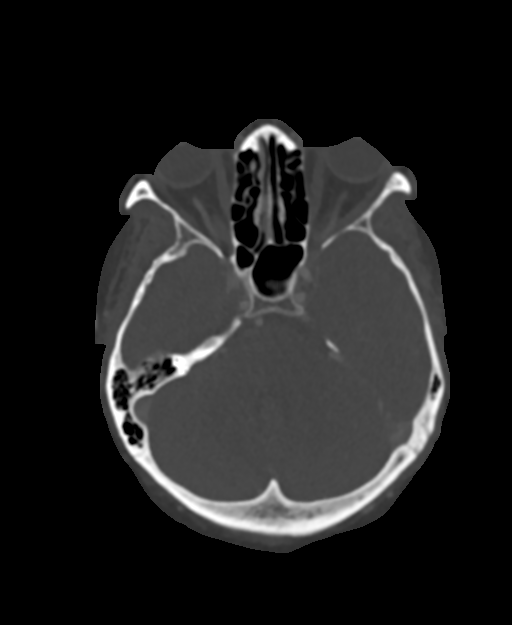
[im 296/356  soft-tissue]
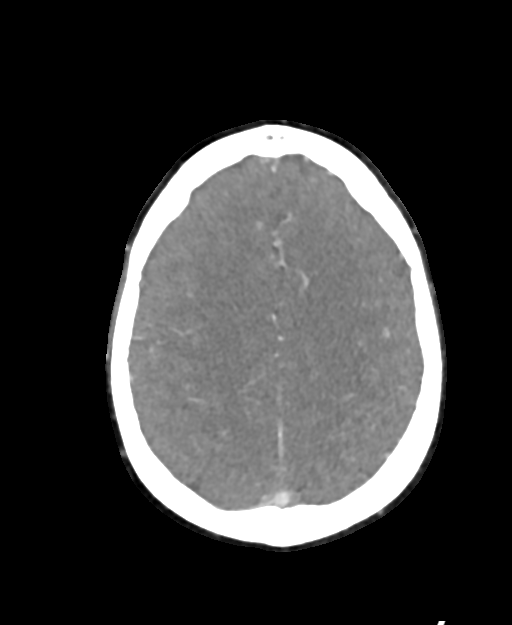

[Series 13: cta neck sagittal · sagittal · 0.45mm/px · 1 of 201 slices shown]
[im 52/201  soft-tissue]
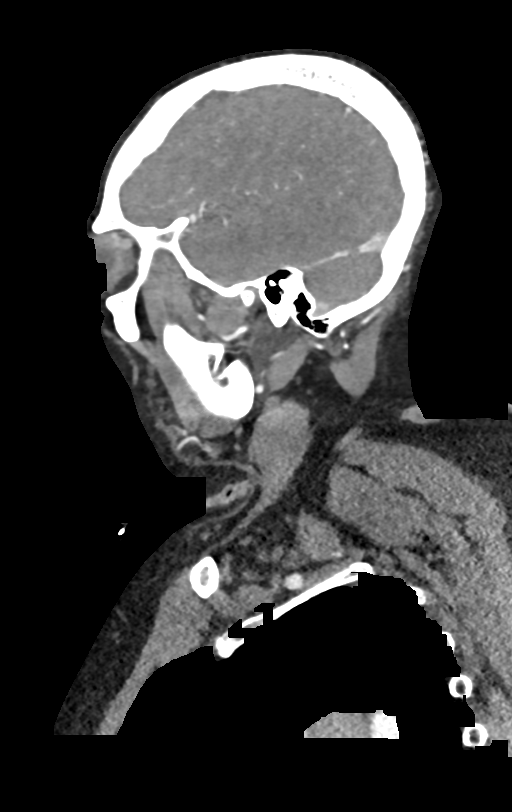

[6 of 36 positions shown; findings below may reference images not displayed]

FINDINGS: CT HEAD FINDINGS

Brain: There is no evidence of acute infarct, intracranial
hemorrhage, mass, midline shift, or extra-axial fluid collection. As
seen on MRI, there is a small chronic left cerebellar infarct. The
ventricles are normal in size.

Vascular: Calcified atherosclerosis at the skull base.

Skull: No fracture or focal osseous lesion.

Sinuses: Visualized paranasal sinuses and mastoid air cells are
clear.

Orbits: Unremarkable.

CTA NECK FINDINGS

Aortic arch: Normal variant aortic arch branching pattern with
common origin of the brachiocephalic and left common carotid
arteries. Mild arch atherosclerosis. Widely patent arch vessel
origins.

Right carotid system: Patent without evidence of stenosis,
dissection, or significant atherosclerosis.

Left carotid system: Patent without evidence of stenosis,
dissection, or significant atherosclerosis.

Vertebral arteries: Patent and codominant without evidence of
stenosis or dissection.

Skeleton: Mild cervical spondylosis. No acute osseous abnormality or
suspicious osseous lesion. Poor dentition with multiple dental
caries as well as prominent periapical lucency involving the Kaminski
remaining left mandibular molar tooth.

Other neck: No evidence of acute abnormality or mass. Borderline
enlarged level IIa lymph nodes measuring up to 1 cm in short axis on
the left, unchanged from the prior CTA.

Upper chest: Similar mosaic attenuation in the left greater than
right lung apices which may reflect air trapping.

Review of the MIP images confirms the above findings

CTA HEAD FINDINGS

Anterior circulation: The internal carotid arteries are patent from
skull base to carotid termini with mild atherosclerosis bilaterally
not resulting in significant stenosis. ACAs and MCAs are patent
without evidence of proximal branch occlusion or flow limiting
proximal stenosis. Mild branch vessel irregularity is noted
bilaterally. No aneurysm is identified.

Posterior circulation: The intracranial vertebral arteries are
widely patent to the basilar. Dominant right PICA and left AICA.
Patent bilateral SCA origins. The basilar artery is patent and
mildly small in caliber diffusely without significant focal
stenosis. There are left larger than right posterior communicating
arteries with left P1 hypoplasia. Diffuse P2 and more distal branch
vessel irregularity is more notable than on the prior CTA and is
likely in part technical as well as reflecting underlying
atherosclerosis. No aneurysm is identified.

Venous sinuses: Patent as permitted by arterial phase contrast
timing.

Anatomic variants: Hypoplastic left P1 segment.

Delayed phase: No abnormal enhancement.

Review of the MIP images confirms the above findings
IMPRESSION: 1. No emergent large vessel occlusion.
2. Widely patent cervical carotid and vertebral arteries.
3. Mild intracranial atherosclerosis without evidence of major
branch occlusion or flow limiting proximal stenosis.
4.  Aortic Atherosclerosis ([id number]).

## 2019-02-25 ENCOUNTER — Other Ambulatory Visit: Payer: Self-pay | Admitting: Family Medicine

## 2019-02-28 ENCOUNTER — Other Ambulatory Visit: Payer: Self-pay

## 2019-02-28 ENCOUNTER — Other Ambulatory Visit: Payer: Self-pay | Admitting: Internal Medicine

## 2019-02-28 ENCOUNTER — Ambulatory Visit
Admission: RE | Admit: 2019-02-28 | Discharge: 2019-02-28 | Disposition: A | Discharge status: Home / Self Care | Payer: Medicare HMO | Source: Ambulatory Visit | Attending: Internal Medicine | Admitting: Internal Medicine

## 2019-02-28 DIAGNOSIS — J209 Acute bronchitis, unspecified: Secondary | ICD-10-CM

## 2019-02-28 DIAGNOSIS — R52 Pain, unspecified: Secondary | ICD-10-CM

## 2019-04-07 ENCOUNTER — Other Ambulatory Visit: Payer: Self-pay | Admitting: Internal Medicine

## 2019-04-07 DIAGNOSIS — Z1231 Encounter for screening mammogram for malignant neoplasm of breast: Secondary | ICD-10-CM

## 2019-04-25 ENCOUNTER — Other Ambulatory Visit (HOSPITAL_BASED_OUTPATIENT_CLINIC_OR_DEPARTMENT_OTHER): Payer: Self-pay

## 2019-04-25 DIAGNOSIS — G4733 Obstructive sleep apnea (adult) (pediatric): Secondary | ICD-10-CM

## 2019-05-23 ENCOUNTER — Other Ambulatory Visit: Payer: Self-pay | Admitting: Sports Medicine

## 2019-05-23 DIAGNOSIS — M25511 Pain in right shoulder: Secondary | ICD-10-CM

## 2019-05-31 ENCOUNTER — Encounter (HOSPITAL_BASED_OUTPATIENT_CLINIC_OR_DEPARTMENT_OTHER): Payer: Medicare Other

## 2019-06-19 ENCOUNTER — Other Ambulatory Visit: Payer: Medicare Other

## 2019-06-19 ENCOUNTER — Inpatient Hospital Stay: Admission: RE | Admit: 2019-06-19 | Payer: Medicare Other | Source: Ambulatory Visit

## 2019-07-17 ENCOUNTER — Other Ambulatory Visit (HOSPITAL_COMMUNITY): Payer: Self-pay | Admitting: Sports Medicine

## 2019-07-17 DIAGNOSIS — M79605 Pain in left leg: Secondary | ICD-10-CM

## 2019-07-17 DIAGNOSIS — M7989 Other specified soft tissue disorders: Secondary | ICD-10-CM

## 2019-07-18 ENCOUNTER — Ambulatory Visit (HOSPITAL_COMMUNITY): Payer: Medicare Other

## 2019-07-19 ENCOUNTER — Ambulatory Visit (HOSPITAL_COMMUNITY)
Admission: RE | Admit: 2019-07-19 | Discharge: 2019-07-19 | Disposition: A | Discharge status: Home / Self Care | Payer: Medicare Other | Source: Ambulatory Visit | Attending: Sports Medicine | Admitting: Sports Medicine

## 2019-07-19 ENCOUNTER — Other Ambulatory Visit: Payer: Self-pay

## 2019-07-19 ENCOUNTER — Encounter (INDEPENDENT_AMBULATORY_CARE_PROVIDER_SITE_OTHER): Payer: Self-pay

## 2019-07-19 DIAGNOSIS — M7989 Other specified soft tissue disorders: Secondary | ICD-10-CM

## 2019-07-19 DIAGNOSIS — M79605 Pain in left leg: Secondary | ICD-10-CM | POA: Diagnosis present

## 2019-07-19 NOTE — Progress Notes (Signed)
Lower extremity venous has been completed.   Preliminary results in CV Proc.   Abram Sander 07/19/2019 3:18 PM

## 2019-08-29 ENCOUNTER — Other Ambulatory Visit: Payer: Self-pay

## 2019-08-29 ENCOUNTER — Emergency Department (HOSPITAL_COMMUNITY): Payer: Medicare Other

## 2019-08-29 ENCOUNTER — Inpatient Hospital Stay (HOSPITAL_COMMUNITY)
Admission: EM | Admit: 2019-08-29 | Discharge: 2019-09-02 | DRG: 149 | Disposition: A | Discharge status: Home Health | Payer: Medicare Other | Attending: Internal Medicine | Admitting: Internal Medicine

## 2019-08-29 DIAGNOSIS — I5040 Unspecified combined systolic (congestive) and diastolic (congestive) heart failure: Secondary | ICD-10-CM

## 2019-08-29 DIAGNOSIS — Z87891 Personal history of nicotine dependence: Secondary | ICD-10-CM

## 2019-08-29 DIAGNOSIS — M25511 Pain in right shoulder: Secondary | ICD-10-CM | POA: Diagnosis present

## 2019-08-29 DIAGNOSIS — G35 Multiple sclerosis: Secondary | ICD-10-CM | POA: Diagnosis present

## 2019-08-29 DIAGNOSIS — Z8701 Personal history of pneumonia (recurrent): Secondary | ICD-10-CM

## 2019-08-29 DIAGNOSIS — R42 Dizziness and giddiness: Secondary | ICD-10-CM | POA: Diagnosis present

## 2019-08-29 DIAGNOSIS — I10 Essential (primary) hypertension: Secondary | ICD-10-CM | POA: Diagnosis present

## 2019-08-29 DIAGNOSIS — F17210 Nicotine dependence, cigarettes, uncomplicated: Secondary | ICD-10-CM

## 2019-08-29 DIAGNOSIS — Z801 Family history of malignant neoplasm of trachea, bronchus and lung: Secondary | ICD-10-CM

## 2019-08-29 DIAGNOSIS — Z885 Allergy status to narcotic agent status: Secondary | ICD-10-CM

## 2019-08-29 DIAGNOSIS — H8120 Vestibular neuronitis, unspecified ear: Principal | ICD-10-CM | POA: Diagnosis present

## 2019-08-29 DIAGNOSIS — Z888 Allergy status to other drugs, medicaments and biological substances status: Secondary | ICD-10-CM | POA: Diagnosis not present

## 2019-08-29 DIAGNOSIS — Z91018 Allergy to other foods: Secondary | ICD-10-CM | POA: Diagnosis not present

## 2019-08-29 DIAGNOSIS — M797 Fibromyalgia: Secondary | ICD-10-CM | POA: Diagnosis present

## 2019-08-29 DIAGNOSIS — Z79899 Other long term (current) drug therapy: Secondary | ICD-10-CM | POA: Diagnosis not present

## 2019-08-29 DIAGNOSIS — Z7902 Long term (current) use of antithrombotics/antiplatelets: Secondary | ICD-10-CM

## 2019-08-29 DIAGNOSIS — Z20828 Contact with and (suspected) exposure to other viral communicable diseases: Secondary | ICD-10-CM | POA: Diagnosis present

## 2019-08-29 DIAGNOSIS — G4733 Obstructive sleep apnea (adult) (pediatric): Secondary | ICD-10-CM | POA: Diagnosis present

## 2019-08-29 DIAGNOSIS — G43109 Migraine with aura, not intractable, without status migrainosus: Secondary | ICD-10-CM

## 2019-08-29 DIAGNOSIS — G8921 Chronic pain due to trauma: Secondary | ICD-10-CM | POA: Diagnosis not present

## 2019-08-29 DIAGNOSIS — I110 Hypertensive heart disease with heart failure: Secondary | ICD-10-CM | POA: Diagnosis present

## 2019-08-29 DIAGNOSIS — M1990 Unspecified osteoarthritis, unspecified site: Secondary | ICD-10-CM | POA: Diagnosis present

## 2019-08-29 DIAGNOSIS — Z8673 Personal history of transient ischemic attack (TIA), and cerebral infarction without residual deficits: Secondary | ICD-10-CM | POA: Diagnosis not present

## 2019-08-29 DIAGNOSIS — G8929 Other chronic pain: Secondary | ICD-10-CM | POA: Diagnosis present

## 2019-08-29 DIAGNOSIS — F329 Major depressive disorder, single episode, unspecified: Secondary | ICD-10-CM | POA: Diagnosis present

## 2019-08-29 DIAGNOSIS — Z91010 Allergy to peanuts: Secondary | ICD-10-CM

## 2019-08-29 DIAGNOSIS — I5042 Chronic combined systolic (congestive) and diastolic (congestive) heart failure: Secondary | ICD-10-CM | POA: Diagnosis present

## 2019-08-29 DIAGNOSIS — Z8249 Family history of ischemic heart disease and other diseases of the circulatory system: Secondary | ICD-10-CM | POA: Diagnosis not present

## 2019-08-29 DIAGNOSIS — G588 Other specified mononeuropathies: Secondary | ICD-10-CM | POA: Diagnosis not present

## 2019-08-29 DIAGNOSIS — H8190 Unspecified disorder of vestibular function, unspecified ear: Secondary | ICD-10-CM | POA: Diagnosis present

## 2019-08-29 DIAGNOSIS — I998 Other disorder of circulatory system: Secondary | ICD-10-CM | POA: Diagnosis not present

## 2019-08-29 DIAGNOSIS — H5500 Unspecified nystagmus: Secondary | ICD-10-CM | POA: Diagnosis present

## 2019-08-29 DIAGNOSIS — G43909 Migraine, unspecified, not intractable, without status migrainosus: Secondary | ICD-10-CM

## 2019-08-29 DIAGNOSIS — F429 Obsessive-compulsive disorder, unspecified: Secondary | ICD-10-CM | POA: Diagnosis present

## 2019-08-29 LAB — BASIC METABOLIC PANEL
Anion gap: 12 (ref 5–15)
BUN: 6 mg/dL (ref 6–20)
CO2: 24 mmol/L (ref 22–32)
Calcium: 8.7 mg/dL — ABNORMAL LOW (ref 8.9–10.3)
Chloride: 106 mmol/L (ref 98–111)
Creatinine, Ser: 0.63 mg/dL (ref 0.44–1.00)
GFR calc Af Amer: >60 mL/min (ref >60)
GFR calc non Af Amer: >60 mL/min (ref >60)
Glucose, Bld: 129 mg/dL — ABNORMAL HIGH (ref 70–99)
Potassium: 3.5 mmol/L (ref 3.5–5.1)
Sodium: 142 mmol/L (ref 135–145)

## 2019-08-29 LAB — CBC
HCT: 45.3 % (ref 36.0–46.0)
Hemoglobin: 14.7 g/dL (ref 12.0–15.0)
MCH: 30.1 pg (ref 26.0–34.0)
MCHC: 32.5 g/dL (ref 30.0–36.0)
MCV: 92.8 fL (ref 80.0–100.0)
Platelets: 195 10*3/uL (ref 150–400)
RBC: 4.88 MIL/uL (ref 3.87–5.11)
RDW: 14.7 % (ref 11.5–15.5)
WBC: 9.1 10*3/uL (ref 4.0–10.5)
nRBC: 0 % (ref 0.0–0.2)

## 2019-08-29 LAB — TROPONIN I (HIGH SENSITIVITY): Troponin I (High Sensitivity): 6 ng/L (ref <18)

## 2019-08-29 LAB — SARS CORONAVIRUS 2 BY RT PCR (HOSPITAL ORDER, PERFORMED IN ~~LOC~~ HOSPITAL LAB): SARS Coronavirus 2: NEGATIVE

## 2019-08-29 MED ORDER — SODIUM CHLORIDE 0.9 % IV BOLUS
1000.0000 mL | Freq: Once | INTRAVENOUS | Status: AC
  Administered 2019-08-29: 1000 mL via INTRAVENOUS

## 2019-08-29 MED ORDER — MECLIZINE HCL 25 MG PO TABS
25.0000 mg | ORAL_TABLET | Freq: Three times a day (TID) | ORAL | Status: DC
  Administered 2019-08-29 – 2019-09-02 (×13): 25 mg via ORAL
  Filled 2019-08-29 (×15): qty 1

## 2019-08-29 MED ORDER — SODIUM CHLORIDE 0.9% FLUSH: 3.0000 mL | Freq: Once | INTRAVENOUS | Status: DC

## 2019-08-29 MED ORDER — ONDANSETRON HCL 4 MG/2ML IJ SOLN
4.0000 mg | Freq: Once | INTRAMUSCULAR | Status: AC
  Administered 2019-08-29: 4 mg via INTRAVENOUS
  Filled 2019-08-29: qty 2

## 2019-08-29 MED ORDER — FLUOXETINE HCL 20 MG PO CAPS
60.0000 mg | ORAL_CAPSULE | Freq: Every day | ORAL | Status: DC
  Administered 2019-08-29 – 2019-09-02 (×5): 60 mg via ORAL
  Filled 2019-08-29 (×6): qty 3

## 2019-08-29 MED ORDER — PROMETHAZINE HCL 25 MG PO TABS
12.5000 mg | ORAL_TABLET | Freq: Once | ORAL | Status: AC
  Administered 2019-08-29: 12.5 mg via ORAL
  Filled 2019-08-29: qty 1

## 2019-08-29 MED ORDER — PROCHLORPERAZINE EDISYLATE 10 MG/2ML IJ SOLN
10.0000 mg | Freq: Three times a day (TID) | INTRAMUSCULAR | Status: DC | PRN
  Administered 2019-08-30 – 2019-08-31 (×2): 10 mg via INTRAVENOUS
  Filled 2019-08-29 (×2): qty 2

## 2019-08-29 MED ORDER — SODIUM CHLORIDE 0.9% FLUSH
3.0000 mL | Freq: Two times a day (BID) | INTRAVENOUS | Status: DC
  Administered 2019-08-29 – 2019-09-02 (×9): 3 mL via INTRAVENOUS

## 2019-08-29 MED ORDER — AMLODIPINE BESYLATE 5 MG PO TABS
5.0000 mg | ORAL_TABLET | Freq: Every day | ORAL | Status: DC
  Administered 2019-08-29 – 2019-09-02 (×5): 5 mg via ORAL
  Filled 2019-08-29 (×5): qty 1

## 2019-08-29 MED ORDER — CARVEDILOL 6.25 MG PO TABS
6.2500 mg | ORAL_TABLET | Freq: Two times a day (BID) | ORAL | Status: DC
  Administered 2019-08-29 – 2019-09-02 (×8): 6.25 mg via ORAL
  Filled 2019-08-29 (×9): qty 1

## 2019-08-29 MED ORDER — KETOROLAC TROMETHAMINE 30 MG/ML IJ SOLN
30.0000 mg | Freq: Once | INTRAMUSCULAR | Status: AC
  Administered 2019-08-29: 30 mg via INTRAVENOUS
  Filled 2019-08-29: qty 1

## 2019-08-29 MED ORDER — ACETAMINOPHEN 325 MG PO TABS
650.0000 mg | ORAL_TABLET | Freq: Four times a day (QID) | ORAL | Status: DC | PRN
  Administered 2019-08-29 (×2): 650 mg via ORAL
  Filled 2019-08-29 (×2): qty 2

## 2019-08-29 MED ORDER — FUROSEMIDE 20 MG PO TABS
20.0000 mg | ORAL_TABLET | ORAL | Status: DC
  Administered 2019-08-29 – 2019-09-02 (×5): 20 mg via ORAL
  Filled 2019-08-29 (×5): qty 1

## 2019-08-29 MED ORDER — ATORVASTATIN CALCIUM 40 MG PO TABS
40.0000 mg | ORAL_TABLET | Freq: Every day | ORAL | Status: DC
  Administered 2019-08-29 – 2019-09-02 (×5): 40 mg via ORAL
  Filled 2019-08-29 (×5): qty 1

## 2019-08-29 MED ORDER — ACETAMINOPHEN 650 MG RE SUPP: 650.0000 mg | Freq: Four times a day (QID) | RECTAL | Status: DC | PRN

## 2019-08-29 MED ORDER — ENOXAPARIN SODIUM 40 MG/0.4ML ~~LOC~~ SOLN
40.0000 mg | SUBCUTANEOUS | Status: DC
  Administered 2019-08-29 – 2019-09-01 (×4): 40 mg via SUBCUTANEOUS
  Filled 2019-08-29 (×4): qty 0.4

## 2019-08-29 MED ORDER — LORAZEPAM 2 MG/ML IJ SOLN
1.0000 mg | Freq: Once | INTRAMUSCULAR | Status: AC
  Administered 2019-08-29: 1 mg via INTRAVENOUS
  Filled 2019-08-29: qty 1

## 2019-08-29 MED ORDER — CLOPIDOGREL BISULFATE 75 MG PO TABS
75.0000 mg | ORAL_TABLET | Freq: Every day | ORAL | Status: DC
  Administered 2019-08-29 – 2019-09-02 (×5): 75 mg via ORAL
  Filled 2019-08-29 (×5): qty 1

## 2019-08-29 MED ORDER — LOSARTAN POTASSIUM 50 MG PO TABS
50.0000 mg | ORAL_TABLET | Freq: Every day | ORAL | Status: DC
  Administered 2019-08-29 – 2019-09-02 (×5): 50 mg via ORAL
  Filled 2019-08-29 (×5): qty 1

## 2019-08-29 MED ORDER — PANTOPRAZOLE SODIUM 40 MG PO TBEC
40.0000 mg | DELAYED_RELEASE_TABLET | Freq: Every day | ORAL | Status: DC
  Administered 2019-08-29 – 2019-09-02 (×5): 40 mg via ORAL
  Filled 2019-08-29 (×5): qty 1

## 2019-08-29 MED ORDER — MECLIZINE HCL 25 MG PO TABS
25.0000 mg | ORAL_TABLET | Freq: Once | ORAL | Status: AC
  Administered 2019-08-29: 25 mg via ORAL
  Filled 2019-08-29: qty 1

## 2019-08-29 NOTE — ED Provider Notes (Signed)
  Physical Exam  BP (!) 167/99   Pulse 88   Temp 97.6 F (36.4 C) (Oral)   Resp 11   SpO2 94%   Physical Exam  ED Course/Procedures     Procedures  MDM  Patient care assumed at 7 AM.  Patient has acute onset of dizziness.  Feels that the room was spinning.  Had some horizontal nystagmus on initial exam CT head showed previous stroke but no acute stroke .  Signout pending reassessment.   7:30 AM Still dizzy and unable to sit up. Will give ativan and order MRI brain   10:00 AM MRI showed previous stroke. Still unable to sit up or stand. Still has L horizontal nystagmus. Still admit to internal medicine service for vestibular rehab      Drenda Freeze, MD 08/29/19 1004

## 2019-08-29 NOTE — ED Triage Notes (Addendum)
To ED for eval of chest pressure that woke pt just pta. Also complains of nausea and dizziness. Dizziness started yesterday morning - slightly but increased throughout the last 24 hrs. Work this am 'very dizzy'. Face is symmetrical, grips equal and strong. Speech clear.

## 2019-08-29 NOTE — H&P (Addendum)
Date: 08/29/2019               Patient Name:  Megan Dixon MRN: [id number]  DOB: 05-14-1966 Age / Sex: 53 y.o., female   PCP: Audley Hose, MD         Medical Service: Internal Medicine Teaching Service         Attending Physician: Dr. Evette Doffing, Mallie Mussel, *    First Contact: Dr. Benjamine Mola Pager: [phone]  Second Contact: Dr. 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  She woke up at 2 AM this morning and noticed the room was spinning.  Symptoms were very mild and she was able to go back to sleep.  She then woke up again at 4 AM and was not able to sit up and get out of bed to go to the bathroom due to severe dizziness that she again describes as the room spinning. Her dizziness improves at times, but never goes away.  Any change in position exacerbates dizziness and leads to nausea and vomiting.  She also complains of blurry vision during these episodes and bilateral, intermittent hearing loss and a headache that are not necessarily associated with the dizziness.  She denies recent illness, fever, chills, chest pain, shortness of breath, abdominal pain, urinary symptoms, changes in bowel movements, weakness, numbness, changes in speech.  She had similar symptoms about 1 year ago that required a 6-day admission. At that time she was found to have an old L cerebellar stroke on MRI.  She was treated with meclizine and vestibular PT with improvement in symptoms.  Meds:  Current Meds  Medication Sig  . amLODipine (NORVASC) 5 MG tablet Take 1 tablet (5 mg total) by mouth daily.  Marland Kitchen atorvastatin (LIPITOR) 40 MG tablet Take 1  tablet (40 mg total) by mouth daily.  . carvedilol (COREG) 6.25 MG tablet Take 1 tablet (6.25 mg total) by mouth 2 (two) times daily with a meal.  . clopidogrel (PLAVIX) 75 MG tablet Take 1 tablet (75 mg total) by mouth daily.  . cyclobenzaprine (FLEXERIL) 10 MG tablet Take 10 mg by mouth 3 (three) times daily as needed for muscle spasms.  Marland Kitchen FLUoxetine (PROZAC) 20 MG capsule Take 60 mg by mouth daily.  . furosemide (LASIX) 20 MG tablet Take 20 mg by mouth every morning.  . gabapentin (NEURONTIN) 600 MG tablet Take 600 mg by mouth 3 (three) times daily.   Marland Kitchen losartan (COZAAR) 50 MG tablet Take 1 tablet (50 mg total) by mouth daily.  . meclizine (ANTIVERT) 25 MG tablet Take 1 tablet (25 mg total) by mouth 3 (three) times daily as needed for dizziness.  . pantoprazole (PROTONIX) 40 MG tablet Take 40 mg by mouth daily.     Allergies: Allergies as of 08/29/2019 - Review Complete 08/29/2019  Allergen Reaction Noted  . Morphine and related Other (See Comments) 11/11/2015  . Other Other (See Comments) 08/09/2017  . Peanut-containing drug products Other (See Comments) 08/09/2017  . Toradol [ketorolac tromethamine] Anxiety 11/11/2015   Past Medical History:  Diagnosis Date  . Arthritis   . Depression   .  Fibromyalgia   . Hypertension   . Lupus ([place])    a. was told bloodwork was possibly positive for lupus in 08/2018 by PCP.  Marland Kitchen Morbid obesity ([place])   . MS (multiple sclerosis) ([place])    a. presumptive dx in [place], [place]  . OCD (obsessive compulsive disorder)   . OSA (obstructive sleep apnea)    a. dx recently, pending CPAP initiation.  . Palpitations   . Pneumonia 07/2018   left lung    Family History:  Family History  Problem Relation Age of Onset  . Lung cancer Mother   . Seizures Brother   . Heart attack Brother        Happened during seizure    Social History: Patient lives alone and is able to complete ADLs independently.  She had 2 sons.  Moved from [place] 5 years ago.   Currently disabled, used to be a cook.  Denies alcohol and illicit drug use.  She is a current smoker with a 20-pack-year history.  Review of Systems: A complete ROS was negative except as per HPI.   Physical Exam: Blood pressure (!) 154/103, pulse 95, temperature 97.6 F (36.4 C), temperature source Oral, resp. rate 16, SpO2 96 %.  General: Tired/somnolent appearing female, laying very still in bed, in no acute distress HENT: NCAT, EOM clear without erythema or exudates and pearly white TM, neck supple and FROM, OP clear without exudates or erythema Eyes: anicteric sclera, PERRL, few beats of left beating nystagmus Cardiac: regular rate and rhythm, nl S1/S2, no murmurs, rubs or gallops  Pulm: CTAB, no wheezes or crackles, no increased work of breathing  Abd: soft, NTND, normoactive bowel sounds  Neuro: A&Ox3, CN II-XII intact, sensation intact in all four extremities, no motor deficits, unable to perform HINTS exam due to dizziness and nausea  Ext: warm and well perfused, trace peripheral edema  Derm: no rashes or lesions noted     EKG: personally reviewed my interpretation is normal sinus rhythm, prolonged QT, no signs of acute ischemia   CXR: personally reviewed my interpretation is no opacities, consolidations, or effusions noted  Assessment & Plan by Problem: Principal Problem:   Acute vestibular syndrome  Megan Dixon is a 53 year old female with a medical history of 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  Initially, there was concern for a central etiology and she had a head CT and MRI to assess for stroke. No acute intracranial abnormalities were seen. However, CVA has not been ruled out as symptoms started < 24hrs ago and MRI could be  falsely negative for cerebellar stroke within this time frame. Will consider repeating in MRI brain if symptoms do not improve with symptomatic management as below.   # Acute vestibular syndrome  - Meclizine 25 mg TID - Compazine 10 mg q8h PRN for nausea  - Vestibular PT  - Holding home centrally acting medications  - EKG in AM to monitor QT interval   # Chronic, combined systolic and diastolic HF # HTN - Continue home Coreg, losartan, and high intensity statin.   # Hx of L cerebellar infarct: Continue home Plavix and high intensity statin.     FEN: HH diet VTE ppx: enoxaparin    Code status: FULL CODE, confirmed on admission   Dispo: Admit  patient to Inpatient with expected length of stay greater than 2 midnights.  SignedWelford Roche, MD 08/29/2019, 12:50 PM  Pager: [phone]

## 2019-08-29 NOTE — Evaluation (Signed)
PT Cancellation Note  Patient Details Name: Kameya Aydt MRN: [id number] DOB: [birth year]   Cancelled Treatment:    Reason Eval/Treat Not Completed: Other (comment) seeking clarification from RN (RN unavailable presently) on if patient is on airborne precautions or not- will attempt to return if time/schedule allow.   Deniece Ree PT, DPT, CBIS  Supplemental Physical Therapist [facility]    Pager [phone] Acute Rehab Office [phone]

## 2019-08-29 NOTE — ED Provider Notes (Signed)
[facility] EMERGENCY DEPARTMENT Provider Note   CSN: [id number] Arrival date & time: 08/29/19  0441     History   Chief Complaint Chief Complaint  Patient presents with  . Chest Pain  . Dizziness  . Nausea    HPI Cassandria Lubich is a 53 y.o. female.     Patient with history of Fibromyalgia, HTN, OSA, OCD, and vertigo.  She presents today with complaints of dizziness and vomiting that started at approximately 3AM and woke her from sleep.  Patient describes a spinning sensation that is worse when she turns her head and changes position.  She has some relief when she closes her eyes.  She denies any weakness of her arms or legs.  She denies any visual disturbances or headache.  The history is provided by the patient.  Dizziness Quality:  Room spinning Severity:  Severe Onset quality:  Sudden Duration:  2 hours Timing:  Constant Progression:  Worsening Chronicity:  New Context: head movement   Relieved by:  Nothing Worsened by:  Movement, standing up, turning head and sitting upright Ineffective treatments:  None tried   Past Medical History:  Diagnosis Date  . Arthritis   . Depression   . Fibromyalgia   . Hypertension   . Lupus ([place])    a. was told bloodwork was possibly positive for lupus in 08/2018 by PCP.  Marland Kitchen Morbid obesity ([place])   . MS (multiple sclerosis) ([place])    a. presumptive dx in [place], [place]  . OCD (obsessive compulsive disorder)   . OSA (obstructive sleep apnea)    a. dx recently, pending CPAP initiation.  . Palpitations   . Pneumonia 07/2018   left lung    Patient Active Problem List   Diagnosis Date Noted  . Morbid obesity ([place])   . Cerebellar infarction ([place])   . Vertigo 09/18/2018  . MS (multiple sclerosis) ([place]) 08/01/2018  . Hypertension 08/01/2018  . Fibromyalgia 08/01/2018  . Blood-tinged sputum 08/01/2018  . Community acquired pneumonia of left lung   . Incarcerated incisional hernia 08/10/2017  . S/P  exploratory laparotomy 08/09/2017    Past Surgical History:  Procedure Laterality Date  . ABDOMINAL SURGERY    . CARPAL TUNNEL RELEASE    . HERNIA REPAIR    . JOINT REPLACEMENT    . VENTRAL HERNIA REPAIR N/A 08/09/2017   Procedure: Incarcerated ventral hernia repair with mesh ;  Surgeon: Rolm Bookbinder, MD;  Location: [facility];  Service: General;  Laterality: N/A;     OB History   No obstetric history on file.      Home Medications    Prior to Admission medications   Medication Sig Start Date End Date Taking? Authorizing Provider  amLODipine (NORVASC) 5 MG tablet Take 1 tablet (5 mg total) by mouth daily. 09/25/18   Mullis, Kiersten P, DO  atorvastatin (LIPITOR) 40 MG tablet Take 1 tablet (40 mg total) by mouth daily. 09/24/18   Mullis, Kiersten P, DO  carvedilol (COREG) 6.25 MG tablet Take 1 tablet (6.25 mg total) by mouth 2 (two) times daily with a meal. 09/24/18   Mullis, Kiersten P, DO  clopidogrel (PLAVIX) 75 MG tablet Take 1 tablet (75 mg total) by mouth daily. 09/25/18   Mullis, Kiersten P, DO  cyclobenzaprine (FLEXERIL) 10 MG tablet Take 10 mg by mouth 3 (three) times daily as needed for muscle spasms.    [provider]  FLUoxetine (PROZAC) 20 MG capsule Take 60 mg by mouth daily.  [provider]  furosemide (LASIX) 20 MG tablet Take 20 mg by mouth every morning. 09/07/18   [provider]  gabapentin (NEURONTIN) 600 MG tablet Take 600 mg by mouth 3 (three) times daily.     [provider]  losartan (COZAAR) 50 MG tablet Take 1 tablet (50 mg total) by mouth daily. 10/18/18   Buford Dresser, MD  meclizine (ANTIVERT) 25 MG tablet Take 1 tablet (25 mg total) by mouth 3 (three) times daily as needed for dizziness. 02/11/18   Harris, Abigail, PA-C  ondansetron (ZOFRAN ODT) 4 MG disintegrating tablet Take 1 tablet (4 mg total) by mouth every 8 (eight) hours as needed for nausea or vomiting. Patient not taking: Reported on 11/28/2018  08/13/17   Kinsinger, Arta Bruce, MD  pantoprazole (PROTONIX) 40 MG tablet Take 40 mg by mouth daily.    [provider]    Family History Family History  Problem Relation Age of Onset  . Lung cancer Mother   . Seizures Brother   . Heart attack Brother        Happened during seizure    Social History Social History   Tobacco Use  . Smoking status: Former Smoker    Types: Cigarettes    Quit date: 05/11/2018    Years since quitting: 1.3  . Smokeless tobacco: Never Used  . Tobacco comment: Smoked for 40 years, quit in 08/2018  Substance Use Topics  . Alcohol use: Not Currently  . Drug use: Never     Allergies   Morphine and related, Other, Peanut-containing drug products, and Toradol [ketorolac tromethamine]   Review of Systems Review of Systems  Neurological: Positive for dizziness.  All other systems reviewed and are negative.    Physical Exam Updated Vital Signs BP (!) 167/108 (BP Location: Right Wrist)   Pulse 92   Temp 97.6 F (36.4 C) (Oral)   Resp 18   SpO2 98%   Physical Exam Vitals signs and nursing note reviewed.  Constitutional:      General: She is not in acute distress.    Appearance: She is well-developed. She is not diaphoretic.  HENT:     Head: Normocephalic and atraumatic.  Neck:     Musculoskeletal: Normal range of motion and neck supple.  Cardiovascular:     Rate and Rhythm: Normal rate and regular rhythm.     Heart sounds: No murmur. No friction rub. No gallop.   Pulmonary:     Effort: Pulmonary effort is normal. No respiratory distress.     Breath sounds: Normal breath sounds. No wheezing.  Abdominal:     General: Bowel sounds are normal. There is no distension.     Palpations: Abdomen is soft.     Tenderness: There is no abdominal tenderness.  Musculoskeletal: Normal range of motion.  Skin:    General: Skin is warm and dry.  Neurological:     Mental Status: She is alert and oriented to person, place, and time.      Motor: No weakness.     Comments: There is some horizontal nystagmus noted      ED Treatments / Results  Labs (all labs ordered are listed, but only abnormal results are displayed) Labs Reviewed  BASIC METABOLIC PANEL  CBC  TROPONIN I (HIGH SENSITIVITY)    EKG EKG Interpretation  Date/Time:  Tuesday August 29 2019 04:50:26 EDT Ventricular Rate:  92 PR Interval:  170 QRS Duration: 88 QT Interval:  418 QTC Calculation: 516 R Axis:  45 Text Interpretation:  Normal sinus rhythm Possible Anterior infarct , age undetermined Prolonged QT Abnormal ECG Confirmed by Veryl Speak [phone]) on 08/29/2019 5:13:22 AM   Radiology Dg Chest 2 View  Result Date: 08/29/2019 CLINICAL DATA:  Chest pressure.  Dizziness. EXAM: CHEST - 2 VIEW COMPARISON:  02/28/2019. FINDINGS: Mediastinum hilar structures are normal. Mild right base atelectasis/infiltrate. No pleural effusion or pneumothorax. Degenerative change thoracic spine. IMPRESSION: Mild right base atelectasis/infiltrate. Electronically Signed   By: Marcello Moores  Register   On: 08/29/2019 05:19    Procedures Procedures (including critical care time)  Medications Ordered in ED Medications  sodium chloride flush (NS) 0.9 % injection 3 mL (has no administration in time range)  ondansetron (ZOFRAN) injection 4 mg (has no administration in time range)  sodium chloride 0.9 % bolus 1,000 mL (has no administration in time range)  meclizine (ANTIVERT) tablet 25 mg (has no administration in time range)     Initial Impression / Assessment and Plan / ED Course  I have reviewed the triage vital signs and the nursing notes.  Pertinent labs & imaging results that were available during my care of the patient were reviewed by me and considered in my medical decision making (see chart for details).  Patient presents with complaints of dizziness and vomiting that are most consistent with a peripheral vertigo.  Head CT negative and laboratory studies  unremarkable.  Patient to receive IV fluids and medications.  Care signed out to Dr. Darl Householder at shift change.  He will follow-up on the patient's response to medication and determine the final disposition, and whether or not additional imaging is indicated.  Final Clinical Impressions(s) / ED Diagnoses   Final diagnoses:  None    ED Discharge Orders    None       Veryl Speak, MD 08/29/19 0710

## 2019-08-29 NOTE — ED Notes (Signed)
MS  Paged admitting Dr to Lucina Mellow

## 2019-08-29 NOTE — ED Notes (Signed)
Lunch tray ordered 

## 2019-08-30 ENCOUNTER — Encounter (HOSPITAL_COMMUNITY): Payer: Self-pay | Admitting: General Practice

## 2019-08-30 DIAGNOSIS — M25511 Pain in right shoulder: Secondary | ICD-10-CM

## 2019-08-30 DIAGNOSIS — G8921 Chronic pain due to trauma: Secondary | ICD-10-CM

## 2019-08-30 LAB — BASIC METABOLIC PANEL
Anion gap: 7 (ref 5–15)
BUN: 6 mg/dL (ref 6–20)
CO2: 28 mmol/L (ref 22–32)
Calcium: 9 mg/dL (ref 8.9–10.3)
Chloride: 108 mmol/L (ref 98–111)
Creatinine, Ser: 0.69 mg/dL (ref 0.44–1.00)
GFR calc Af Amer: >60 mL/min (ref >60)
GFR calc non Af Amer: >60 mL/min (ref >60)
Glucose, Bld: 110 mg/dL — ABNORMAL HIGH (ref 70–99)
Potassium: 3.7 mmol/L (ref 3.5–5.1)
Sodium: 143 mmol/L (ref 135–145)

## 2019-08-30 MED ORDER — DIAZEPAM 2 MG PO TABS
2.0000 mg | ORAL_TABLET | Freq: Two times a day (BID) | ORAL | Status: DC | PRN
  Administered 2019-08-30 – 2019-09-02 (×6): 2 mg via ORAL
  Filled 2019-08-30 (×6): qty 1

## 2019-08-30 MED ORDER — DICLOFENAC SODIUM 1 % TD GEL
2.0000 g | Freq: Three times a day (TID) | TRANSDERMAL | Status: DC
  Administered 2019-08-31 (×2): 2 g via TOPICAL
  Filled 2019-08-30: qty 100

## 2019-08-30 MED ORDER — ACETAMINOPHEN 325 MG PO TABS
650.0000 mg | ORAL_TABLET | Freq: Four times a day (QID) | ORAL | Status: DC
  Administered 2019-09-01 – 2019-09-02 (×5): 650 mg via ORAL
  Filled 2019-08-30 (×9): qty 2

## 2019-08-30 MED ORDER — KETOROLAC TROMETHAMINE 30 MG/ML IJ SOLN
30.0000 mg | Freq: Three times a day (TID) | INTRAMUSCULAR | Status: DC
  Administered 2019-08-30 – 2019-09-01 (×5): 30 mg via INTRAVENOUS
  Filled 2019-08-30 (×5): qty 1

## 2019-08-30 MED ORDER — KETOROLAC TROMETHAMINE 30 MG/ML IJ SOLN: 30.0000 mg | Freq: Four times a day (QID) | INTRAMUSCULAR | Status: DC

## 2019-08-30 MED ORDER — KETOROLAC TROMETHAMINE 30 MG/ML IJ SOLN
30.0000 mg | Freq: Four times a day (QID) | INTRAMUSCULAR | Status: DC | PRN
  Administered 2019-08-30: 30 mg via INTRAVENOUS
  Filled 2019-08-30: qty 1

## 2019-08-30 NOTE — Evaluation (Addendum)
Physical Therapy Evaluation/Vestibular Assessment Patient Details Name: Megan Dixon MRN: [id number] DOB: [birth year] Today's Date: 08/30/2019   History of Present Illness  53 y.o. female admitted on 08/29/19 for MSpersitent vertigo.  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  Pt with significant PMH of MS, morbid obesity, HTN, fibromyalgia, depression arthritis, OSA, L TKA.    Clinical Impression  Pt with near constant dizziness exacerbated by movement.  She has a history (last year 09/2018) of a similar episode.  Testing was inconclusive re: cause, but it seems to be an acute peripheral vertigo episode.  She has posterior neck pain and headache (cervicogenic dizziness?), increased stressors in her life with a recent family death (increasing her neck tension), and decreased ability to stabilize her gaze while moving her head.  Her vestibular testing did not reveal any significant positive nystagmus with oculomotor exam or testing, so we will continue forward with compensation (targeting, segmental turning, and gaze stability training).   PT to follow acutely for deficits listed below.      Follow Up Recommendations Home health PT;Supervision for mobility/OOB(Home health vestibular PT)    Equipment Recommendations  None recommended by PT    Recommendations for Other Services   NA    Precautions / Restrictions Precautions Precautions: Fall Precaution Comments: unsteady due to dizziness      Mobility  Bed Mobility Overal bed mobility: Needs Assistance Bed Mobility: Supine to Sit     Supine to sit: Modified independent (Device/Increase time)     General bed mobility comments: used rail lightly  Transfers Overall transfer level: Needs assistance Equipment used: 1 person hand held assist Transfers: Sit to/from [facility] Sit  to Stand: Min assist Stand pivot transfers: Min assist       General transfer comment: Min assist to stand and pivot to and from [place] to bed.  Daughter providing physical assist.          Balance Overall balance assessment: Needs assistance Sitting-balance support: Feet supported;No upper extremity supported Sitting balance-Leahy Scale: Good Sitting balance - Comments: (+) sway in sitting EOB.   Standing balance support: Single extremity supported Standing balance-Leahy Scale: Poor Standing balance comment: needs external support in standing.                08/30/19 1713  Vestibular Assessment  General Observation eyes closed in dark room, emesis bag and wash cloth near  Symptom Behavior  Subjective history of current problem sudden onset vertigo Tues night when getting up to go to the bathroom, spinning, difficulty walking, constant, tinnitus initially, now resolved, equal hearing, glasses for reading with (+) blurry vision, no fullness, no change in meds, no recent head trauma, no recent antibiotic use or URI or sinus drainage, (+) posterior HA and neck pain, (+) life stressors (death of family member), previous similar episode last year in October.   Type of Dizziness  Spinning;Imbalance  Frequency of Dizziness constant, exacerbated by movement  Duration of Dizziness long  Symptom Nature Constant (increased intensity with movement)  Aggravating Factors Supine to sit;Activity in general  Relieving Factors Closing eyes;Lying supine  Progression of Symptoms No change since onset  History of similar episodes yes, last year at this time  Oculomotor Exam  Oculomotor Alignment Normal  Spontaneous Absent  Gaze-induced  Absent  Smooth Pursuits Saccades (mild, inconsistent)  Comment Oculomotor  exam difficult due to rapid blinking of eyelids  Vestibulo-Ocular Reflex  VOR 1 Head Only (x 1 viewing) very small ROM, very symptomatic both vertical and horizontal   VOR Cancellation   (did not test due to neck pain)  Comment Skew (-)  Auditory  Comments grossly equal  Other Tests  Tragal negative  Positional Testing  Dix-Hallpike  (not indicated) due to nature of symptoms (constant)                    Pertinent Vitals/Pain Pain Assessment: Faces Faces Pain Scale: Hurts little more Pain Location: posterior neck and head  Pain Descriptors / Indicators: Aching Pain Intervention(s): Limited activity within patient's tolerance;Monitored during session;Repositioned    Home Living Family/patient expects to be discharged to:: Private residence Living Arrangements: Other relatives(56 year old grandson, daughter) Available Help at Discharge: Family;Available 24 hours/day Type of Home: House Home Access: Stairs to enter Entrance Stairs-Rails: None Entrance Stairs-Number of Steps: 2(small) Home Layout: One level Home Equipment: Walker - 2 wheels;Cane - single point Additional Comments: reports a recent death in the family and increased stress    Prior Function Level of Independence: Independent with assistive device(s)         Comments: uses RW PRN, 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         Extremity/Trunk Assessment   Upper Extremity Assessment Upper Extremity Assessment: Overall WFL for tasks assessed    Lower Extremity Assessment Lower Extremity Assessment: Overall WFL for tasks assessed    Cervical / Trunk Assessment Cervical / Trunk Assessment: Other exceptions Cervical / Trunk Exceptions: Pt reports sore neck/HA  Communication   Communication: No difficulties  Cognition Arousal/Alertness: Awake/alert Behavior During Therapy: WFL for tasks assessed/performed Overall Cognitive Status: Within Functional Limits for tasks assessed                                               Assessment/Plan    PT Assessment Patient needs continued PT services  PT Problem List Decreased activity  tolerance;Decreased balance;Decreased mobility;Decreased knowledge of use of DME;Decreased safety awareness;Cardiopulmonary status limiting activity;Decreased knowledge of precautions;Obesity;Pain       PT Treatment Interventions DME instruction;Gait training;Stair training;Functional mobility training;Therapeutic activities;Therapeutic exercise;Balance training;Cognitive remediation;Patient/family education    PT Goals (Current goals can be found in the Care Plan section)  Acute Rehab PT Goals Patient Stated Goal: to stop feeling dizzy and nauseated PT Goal Formulation: With patient Time For Goal Achievement: 09/13/19 Potential to Achieve Goals: Good    Frequency Min 4X/week           AM-PAC PT "6 Clicks" Mobility  Outcome Measure Help needed turning from your back to your side while in a flat bed without using bedrails?: None Help needed moving from lying on your back to sitting on the side of a flat bed without using bedrails?: A Little Help needed moving to and from a bed to a chair (including a wheelchair)?: A Little Help needed standing up from a chair using your arms (e.g., wheelchair or bedside chair)?: A Little Help needed to walk in hospital room?: A Little Help needed climbing 3-5 steps with a railing? : A Little 6 Click Score: 19    End of Session   Activity Tolerance: Other (comment)(limited by nausea and dizziness) Patient left: in bed;with call bell/phone within reach  PT Visit Diagnosis: Dizziness and giddiness (R42);Difficulty in walking, not elsewhere classified (R26.2)    Time: [id number] PT Time Calculation (min) (ACUTE ONLY): 25 min   Charges:         Wells Guiles B. Kiyanna Biegler, PT, DPT  Acute Rehabilitation [phone] pager #[phone] office  @ [facility]: [phone]   PT Evaluation $PT Eval Moderate Complexity: 1 Mod PT Treatments $Therapeutic Activity: 8-22 mins        08/30/2019, 5:06 PM

## 2019-08-30 NOTE — ED Notes (Signed)
Pt transferred to hospital bed

## 2019-08-30 NOTE — Progress Notes (Addendum)
  Subjective:  Patient reports continued dizziness described as the room spinning.  Patient had episodes of nausea and vomiting overnight.  She states that she had a headache behind her eyes before the incident started.  Patient also reporting right shoulder pain which is chronic but worse now.   Objective:    Vital Signs (last 24 hours): Vitals:   08/30/19 0230 08/30/19 0330 08/30/19 0500 08/30/19 0828  BP: (!) 141/74 (!) 168/93 (!) 144/96 (!) 143/96  Pulse: 100   96  Resp: 16 19 15 16   Temp:    98.1 F (36.7 C)  TempSrc:    Oral  SpO2: 94%   95%    Physical Exam: General Alert and answers questions appropriately, no acute distress  Cardiac Regular rate and rhythm, no murmurs, rubs, or gallops  Pulmonary Clear to auscultation bilaterally without wheezes, rhonchi, or rales  Extremities No peripheral edema    Assessment/Plan:   Principal Problem:   Acute vestibular syndrome Active Problems:   Hypertension   Ischemic vascular disease   Migraine  Patient is a 53 year old female with past medical history of 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  Patient with persistent symptoms. Due to patient symptoms, patient is currently unable to eat, drink, or ambulate.  # Acute vestibular syndrome: There was concern that patient symptoms represent a central etiology given that they are persistent nature.  Head CT and MRI conducted on this admission shows no acute stroke but redemonstrates small remote left cerebellar infarct.  History indicates preceding headache which may suggest component of migraine. QTc 499 this AM, 516 yesterday * Diazepam 2 mg Q12HR PRN * Prochlorperazine 10 mg IV Q8HR PRN * Meclizine (Antivert) 25 mg TID * Vestibular PT * Will check EKG in AM  # Shoulder pain: Chronic from prior injury. Reports worsening of pain today. Toradol did not improve pain. * Voltaren gel TID  # HTN: # Chronic,  combined systolic and diastolic HF. Will continue home medications - Continue home medications:  - Amlodipine 5 mg QD, Carvedilol 6.25 mg BID, Losartan 50 mg QD, Lasix 20 mg QD  # History of Left cerebellar infarct: * Atorvastatin 40 mg QD + Clopidogrel 75 mg QD   Diet: Heart diet DVT Ppx: Lovenox 40 mg QD Dispo: Anticipated discharge in approximately pending clinical improvement.   Jeanmarie Hubert, MD 08/30/2019, 11:18 AM Pager: [phone]

## 2019-08-31 ENCOUNTER — Inpatient Hospital Stay (HOSPITAL_COMMUNITY): Payer: Medicare Other

## 2019-08-31 LAB — BASIC METABOLIC PANEL
Anion gap: 9 (ref 5–15)
BUN: 9 mg/dL (ref 6–20)
CO2: 26 mmol/L (ref 22–32)
Calcium: 8.9 mg/dL (ref 8.9–10.3)
Chloride: 105 mmol/L (ref 98–111)
Creatinine, Ser: 0.66 mg/dL (ref 0.44–1.00)
GFR calc Af Amer: >60 mL/min (ref >60)
GFR calc non Af Amer: >60 mL/min (ref >60)
Glucose, Bld: 126 mg/dL — ABNORMAL HIGH (ref 70–99)
Potassium: 3.1 mmol/L — ABNORMAL LOW (ref 3.5–5.1)
Sodium: 140 mmol/L (ref 135–145)

## 2019-08-31 MED ORDER — POTASSIUM CHLORIDE CRYS ER 20 MEQ PO TBCR
40.0000 meq | EXTENDED_RELEASE_TABLET | ORAL | Status: AC
  Administered 2019-08-31 (×2): 40 meq via ORAL
  Filled 2019-08-31 (×2): qty 2

## 2019-08-31 NOTE — TOC Initial Note (Addendum)
Transition of Care [facility]) - Initial/Assessment Note    Patient Details  Name: Megan Dixon MRN: [id number] Date of Birth: 1966/06/26  [facility] CM/SW Contact:    Marilu Favre, RN Phone Number: 08/31/2019, 10:22 AM  Clinical Narrative:                  Confirmed face sheet information. Patient has family who lives close by. Provided medicare.gov list of home health agencies. Patient has had [facility] in past and would like them again and same [place] if poosible. Referral given to and accepted by Tiffany at [place] at [facility] , requested same [facility].   Requested home health orders from MD  Expected Discharge Plan: [place] Barriers to Discharge: Continued Medical Work up   Patient Goals and CMS Choice   CMS Medicare.gov Compare Post Acute Care list provided to:: Patient Choice offered to / list presented to : Patient  Expected Discharge Plan and Services Expected Discharge Plan: [place]   Discharge Planning Services: CM Consult Post Acute Care Choice: [place] arrangements for the past 2 months: Single Family Home                 DME Arranged: N/A         HH Arranged: PT HH Agency: [place] (now [facility]) Date [place]: 08/31/19 Time [place]: 1021 Representative spoke with at [place]: [place] Arrangements/Services Living arrangements for the past 2 months: [place] with:: Self Patient language and need for interpreter reviewed:: Yes Do you feel safe going back to the place where you live?: Yes      Need for Family Participation in Patient Care: Yes (Comment) Care giver support system in place?: Yes (comment)   Criminal Activity/Legal Involvement Pertinent to Current Situation/Hospitalization: No - Comment as needed  Activities of Daily Living Home Assistive Devices/Equipment: Cane (specify quad or  straight) ADL Screening (condition at time of admission) Patient's cognitive ability adequate to safely complete daily activities?: Yes Is the patient deaf or have difficulty hearing?: No Does the patient have difficulty seeing, even when wearing glasses/contacts?: No Does the patient have difficulty concentrating, remembering, or making decisions?: No Patient able to express need for assistance with ADLs?: Yes Does the patient have difficulty dressing or bathing?: No Independently performs ADLs?: Yes (appropriate for developmental age) Does the patient have difficulty walking or climbing stairs?: Yes Weakness of Legs: Both Weakness of Arms/Hands: None  Permission Sought/Granted   Permission granted to share information with : Yes, Verbal Permission Granted     Permission granted to share info w AGENCY: [facility]        Emotional Assessment   Attitude/Demeanor/Rapport: Engaged Affect (typically observed): Accepting Orientation: : Oriented to Self, Oriented to Place, Oriented to  Time, Oriented to Situation Alcohol / Substance Use: Not Applicable    Admission diagnosis:  Vertigo [R42] Patient Active Problem List   Diagnosis Date Noted  . Acute vestibular syndrome 08/29/2019  . Ischemic vascular disease 08/29/2019  . Migraine 08/29/2019  . Morbid obesity ([place])   . MS (multiple sclerosis) ([place]) 08/01/2018  . Hypertension 08/01/2018  . Fibromyalgia 08/01/2018  . Blood-tinged sputum 08/01/2018  . Incarcerated incisional hernia 08/10/2017  . S/P exploratory laparotomy 08/09/2017   PCP:  Audley Hose, MD Pharmacy:   [id number] - [place], [place]  AT [place] [id number] [place] [id number] Phone: [phone] Fax: [phone]  [place], [place] [address] [place] [address] Phone: [phone] Fax:  [phone]     Social Determinants of Health (SDOH) Interventions    Readmission Risk Interventions No flowsheet data found.

## 2019-08-31 NOTE — Progress Notes (Signed)
Physical Therapy Treatment Patient Details Name: Megan Dixon MRN: [id number] DOB: 07/06/1966 Today's Date: 08/31/2019    History of Present Illness 53 y.o. female admitted on 08/29/19 for MSpersitent vertigo.  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  Pt with significant PMH of MS, morbid obesity, HTN, fibromyalgia, depression arthritis, OSA, L TKA.      PT Comments    Pt was able to walk a short distance around the room with the support of the RW and using compensatory strategies like targeting and segmental turning.  X1 exercises (seated) horizontal and vertical given and reviewed.  PT will continue to follow acutely for safe mobility progression   Follow Up Recommendations  Home health PT;Supervision for mobility/OOB(please request a vestibular PT)     Equipment Recommendations  None recommended by PT    Recommendations for Other Services   NA     Precautions / Restrictions Precautions Precautions: Fall Precaution Comments: unsteady due to dizziness    Mobility  Bed Mobility Overal bed mobility: Needs Assistance Bed Mobility: Supine to Sit;Sit to Supine     Supine to sit: Supervision;HOB elevated Sit to supine: Supervision;HOB elevated   General bed mobility comments: Pt needs extra time and relies on rails.  Cues for targeting once seated  Transfers Overall transfer level: Needs assistance Equipment used: Rolling walker (2 wheeled) Transfers: Sit to/from Stand Sit to Stand: Min guard         General transfer comment: Min guard assist for safety, cues for targeting and segmental turning.   Ambulation/Gait Ambulation/Gait assistance: Min guard Gait Distance (Feet): 20 Feet Assistive device: Rolling walker (2 wheeled) Gait Pattern/deviations: Step-through pattern;Staggering left;Staggering right Gait velocity:  decreased Gait velocity interpretation: <1.31 ft/sec, indicative of household ambulator General Gait Details: Min guard assist for balance, slow to move implementing targeting and segmental turns for compensatory strategies.           Balance Overall balance assessment: Needs assistance Sitting-balance support: Feet supported;Bilateral upper extremity supported Sitting balance-Leahy Scale: Fair Sitting balance - Comments: (+) sway in sitting EOB.   Standing balance support: Bilateral upper extremity supported Standing balance-Leahy Scale: Poor Standing balance comment: needs external support in standing.                             Cognition Arousal/Alertness: Awake/alert Behavior During Therapy: WFL for tasks assessed/performed Overall Cognitive Status: Within Functional Limits for tasks assessed                                        Exercises Other Exercises Other Exercises: x1 seated vertical and horizontal exercise hanout given and reviewed.  Pt verbalized and demonstrated understanding.         Pertinent Vitals/Pain Pain Assessment: Faces Faces Pain Scale: Hurts little more Pain Location: posterior neck and head  Pain Descriptors / Indicators: Aching Pain Intervention(s): Limited activity within patient's tolerance;Monitored during session;Repositioned       Prior Function            PT Goals (current goals can now be found in the care plan section) Acute Rehab PT Goals Patient Stated Goal: to stop feeling dizzy and nauseated Progress towards PT goals: Progressing toward goals  Frequency    Min 4X/week      PT Plan Current plan remains appropriate       AM-PAC PT "6 Clicks" Mobility   Outcome Measure  Help needed turning from your back to your side while in a flat bed without using bedrails?: None Help needed moving from lying on your back to sitting on the side of a flat bed without using bedrails?: A Little Help  needed moving to and from a bed to a chair (including a wheelchair)?: A Little Help needed standing up from a chair using your arms (e.g., wheelchair or bedside chair)?: A Little Help needed to walk in hospital room?: A Little Help needed climbing 3-5 steps with a railing? : A Little 6 Click Score: 19    End of Session   Activity Tolerance: Other (comment)(limited by nausea and dizziness. )     PT Visit Diagnosis: Dizziness and giddiness (R42);Difficulty in walking, not elsewhere classified (R26.2)     Time: [id number] PT Time Calculation (min) (ACUTE ONLY): 25 min  Charges:  $Therapeutic Exercise: 8-22 mins $Therapeutic Activity: 8-22 mins                    Ladarrius Bogdanski B. Annalycia Done, PT, DPT  Acute Rehabilitation [phone] pager [phone] office  @ [facility]: [phone]   08/31/2019, 6:24 PM

## 2019-08-31 NOTE — Progress Notes (Signed)
  Subjective:  Patient reports continued dizziness.  Patient reports that her headaches come and go and that the Toradol injections have helped.  Patient reports she was able to work with PT yesterday.  She states she has been able to eat mostly crackers and has been drinking a lot of fluids.   Objective:    Vital Signs (last 24 hours): Vitals:   08/30/19 1804 08/30/19 2114 08/31/19 0612 08/31/19 1320  BP: 138/76 (!) 142/87 120/65 (!) 156/91  Pulse: (!) 102 99 99 (!) 102  Resp:  18 16 18   Temp:  98.3 F (36.8 C) 98.4 F (36.9 C) 98.3 F (36.8 C)  TempSrc:  Oral Oral Oral  SpO2:  95% 98% 96%    Physical Exam: General Alert and answers questions appropriately, lying in bed  Cardiac Regular rate and rhythm, no murmurs, rubs, or gallops  Neurology EOM in tact, no nystagmus  Extremities No peripheral edema    Assessment/Plan:   Principal Problem:   Acute vestibular syndrome Active Problems:   Hypertension   Ischemic vascular disease   Migraine  Patient is a 53 year old female with past medical history of 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  Patient with persistent symptoms.  Due to patient's symptoms, patient is currently unable to safely ambulate without supervision.  # Acute vestibular syndrome: Head CT and MRI on admission showed no acute stroke but redemonstrates small remote left cerebellar infarct. Spoke with Dr. Lorraine Lax with neurology about case.  He recommended continuing with current management and to consider initiation of steroids for possible vestibular neuritis.  Patient had no preceding viral symptoms.  At this time, we will hold off on steroids and repeat MRI given concern for central etiology that may not have been visible on initial MRI.  We will also continue with symptomatic management. * Diazepam 2 mg every 12 hours as needed * Prochlorperazine 10 mg IV every 8 hour as needed * Toradol 30 mg  Q8HR + Tylenol 650 mg Q6HR * Meclizine (Antivert) 25 mg 3 times daily * Vestibular PT * QTC 495 today from 499 yesterday.  # Right shoulder pain: Reports pain since injury prior to presentation.  Pain is improved with medications.  X-ray of shoulder shows no fracture or dislocation. * Toradol, Voltaren gel  # HTN:  # Chronic, combined systolic and diastolic heart failure: will continue home medications *Amlodipine 5 mg daily, carvedilol 6.25 mg twice daily, losartan 50 mg daily, Lasix 20 mg daily  # History of left cerebellar infarct: *Atorvastatin 40 mg daily + clopidogrel 75 mg daily  Diet: Heart DVT Ppx: Lovenox Dispo: Anticipated discharge pending clinical improvement  Jeanmarie Hubert, MD 08/31/2019, 2:28 PM Pager: [phone]

## 2019-09-01 LAB — BASIC METABOLIC PANEL
Anion gap: 10 (ref 5–15)
BUN: 12 mg/dL (ref 6–20)
CO2: 25 mmol/L (ref 22–32)
Calcium: 9 mg/dL (ref 8.9–10.3)
Chloride: 105 mmol/L (ref 98–111)
Creatinine, Ser: 0.76 mg/dL (ref 0.44–1.00)
GFR calc Af Amer: >60 mL/min (ref >60)
GFR calc non Af Amer: >60 mL/min (ref >60)
Glucose, Bld: 109 mg/dL — ABNORMAL HIGH (ref 70–99)
Potassium: 3.6 mmol/L (ref 3.5–5.1)
Sodium: 140 mmol/L (ref 135–145)

## 2019-09-01 MED ORDER — IBUPROFEN 800 MG PO TABS
800.0000 mg | ORAL_TABLET | Freq: Three times a day (TID) | ORAL | Status: DC | PRN
  Filled 2019-09-01 (×2): qty 1

## 2019-09-01 MED ORDER — CYCLOBENZAPRINE HCL 5 MG PO TABS
5.0000 mg | ORAL_TABLET | Freq: Once | ORAL | Status: AC
  Administered 2019-09-01: 5 mg via ORAL
  Filled 2019-09-01: qty 1

## 2019-09-01 MED ORDER — PREDNISONE 50 MG PO TABS
60.0000 mg | ORAL_TABLET | Freq: Every day | ORAL | Status: DC
  Administered 2019-09-02: 60 mg via ORAL
  Filled 2019-09-01: qty 1

## 2019-09-01 NOTE — Discharge Summary (Signed)
Name: Megan Dixon MRN: [id number] DOB: Nov 11, 1966 53 y.o. PCP: Audley Hose, MD  Date of Admission: 08/29/2019  4:41 AM Date of Discharge: 09/02/2019 Attending Physician: Aldine Contes, MD  Discharge Diagnosis: 1. Vestibular Neuritis  2. Right shoulder pain  Discharge Medications: Allergies as of 09/02/2019      Reactions   Morphine And Related Other (See Comments)   Headache   Other Other (See Comments)   CANNOT EAT ANYTHING WITH SEEDS (DIVERTICULITIS)   Peanut-containing Drug Products Other (See Comments)   Has Diverticulitis    Toradol [ketorolac Tromethamine] Anxiety      Medication List    TAKE these medications   amLODipine 5 MG tablet Commonly known as: NORVASC Take 1 tablet (5 mg total) by mouth daily.   atorvastatin 40 MG tablet Commonly known as: LIPITOR Take 1 tablet (40 mg total) by mouth daily.   carvedilol 6.25 MG tablet Commonly known as: COREG Take 1 tablet (6.25 mg total) by mouth 2 (two) times daily with a meal.   clopidogrel 75 MG tablet Commonly known as: PLAVIX Take 1 tablet (75 mg total) by mouth daily.   cyclobenzaprine 10 MG tablet Commonly known as: FLEXERIL Take 1 tablet (10 mg total) by mouth 3 (three) times daily as needed for muscle spasms.   diazepam 2 MG tablet Commonly known as: VALIUM Take 1 tablet (2 mg total) by mouth every 12 (twelve) hours as needed (Dizziness).   FLUoxetine 20 MG capsule Commonly known as: PROZAC Take 60 mg by mouth daily.   furosemide 20 MG tablet Commonly known as: LASIX Take 20 mg by mouth every morning.   gabapentin 600 MG tablet Commonly known as: NEURONTIN Take 600 mg by mouth 3 (three) times daily.   ibuprofen 800 MG tablet Commonly known as: ADVIL Take 1 tablet (800 mg total) by mouth every 8 (eight) hours as needed for headache.   losartan 50 MG tablet Commonly known as: COZAAR Take 1 tablet (50 mg total) by mouth daily.   meclizine 25 MG tablet Commonly known as:  ANTIVERT Take 1 tablet (25 mg total) by mouth 3 (three) times daily as needed for dizziness.   ondansetron 4 MG disintegrating tablet Commonly known as: Zofran ODT Take 1 tablet (4 mg total) by mouth every 8 (eight) hours as needed for nausea or vomiting.   pantoprazole 40 MG tablet Commonly known as: PROTONIX Take 40 mg by mouth daily.   predniSONE 20 MG tablet Commonly known as: DELTASONE Take 3 tablets (60 mg total) by mouth daily with breakfast for 3 days, THEN 2.5 tablets (50 mg total) daily with breakfast for 1 day, THEN 2 tablets (40 mg total) daily with breakfast for 1 day, THEN 1.5 tablets (30 mg total) daily with breakfast for 1 day, THEN 1 tablet (20 mg total) daily with breakfast for 1 day, THEN 0.5 tablets (10 mg total) daily with breakfast for 1 day, THEN 0.5 tablets (10 mg total) daily with breakfast for 1 day. Start taking on: September 03, 2019       Disposition and follow-up:   Megan Dixon was discharged from [facility] in Stable condition.  At the hospital follow up visit please address:  1.  1. Vestibular Neuritis: Acutely vestibular syndrome most likely this to be a neuritis given the timeframe, and symptoms with negative MRI x2.  Patient discharged on short course of diazepam, tapering corticosteroids, Zofran and meclizine.  She is given home health vestibular therapy orders as well.  2. Right neck/shoulder pain: Chronic, improved with NSAIDs.  Differential includes cervicogenic headache, tension headache, MSK injury.  Recommend outpatient follow-up  2.  Labs / imaging needed at time of follow-up: N/A  3.  Pending labs/ test needing follow-up: Consider injection for cervicogenic headache.  Follow-up Appointments: Follow-up Information    [facility] Follow up.   Specialty: Home Health Services Contact information: [address] [place] [address] [place] [address] [place]. Schedule an  appointment as soon as possible for a visit.   Why: Please call and schedule an appointment with the [facility] as you requested.  Contact information: [place] Hospital Course by problem list: Vestibular neuritis: Patient presented with acute onset dizziness which was described as the room spinning which worsened with changes in position and was accompanied by nausea/vomiting.  She also complained of photophobia, blurry vision, intermittent hearing loss, and a headache.  She denied recent illness.  Patient had history of similar symptoms 1 year ago that required 6-day hospitalization.  On presentation, patient had few beats of left beating nystagmus.  CT and MRI showed no acute infarct but redemonstrated small remote left cerebral infarct.  MRI was repeated on 9/17 to reassess for cerebellar infarct that may have been missed on initial study - no acute infarct was detected.  Patient was managed symptomatically with diazepam, prochlorperazine, meclizine, Toradol, Phenergan and was provided therapy by vestibular PT. Given the paucity of facts concerning her symptoms supporting an alternative diagnosis she was treated for vestibular neuritis with a tapering dose of corticosteroids.  Patient had minimal improvement with symptomatic therapy and it was thought that symptoms may be secondary to vestibular neuritis given sustained persistent motion induced symptoms, lack of response to therapy, and leftward gaze horizontal nystagmus on presentation.  Patient started on 10-day steroid taper with p.o. prednisone and discharged with instructions to complete taper.  Right neck/shoulder pain: Patient reported pain in right shoulder after hitting it on a door shortly before presentation.  Radiograph was negative for fracture/dislocation, pain improved with Toradol.  Discharged with Motrin although, she says she cannot take this because she had a  prior hernia repair surgery.  Discharge Vitals:   BP (!) 155/105 (BP Location: Left Arm)   Pulse (!) 116   Temp 97.9 F (36.6 C) (Oral)   Resp 18   SpO2 100%   Pertinent Labs, Studies, and Procedures:  CBC    Component Value Date/Time   WBC 9.1 08/29/2019 0541   RBC 4.88 08/29/2019 0541   HGB 14.7 08/29/2019 0541   HCT 45.3 08/29/2019 0541   PLT 195 08/29/2019 0541   MCV 92.8 08/29/2019 0541   MCH 30.1 08/29/2019 0541   MCHC 32.5 08/29/2019 0541   RDW 14.7 08/29/2019 0541   LYMPHSABS 2.4 11/28/2018 0917   MONOABS 0.7 11/28/2018 0917   EOSABS 0.1 11/28/2018 0917   BASOSABS 0.1 11/28/2018 0917   CMP Latest Ref Rng & Units 09/02/2019 09/01/2019 08/31/2019  Glucose 70 - 99 mg/dL 115(H) 109(H) 126(H)  BUN 6 - 20 mg/dL 16 12 9   Creatinine 0.44 - 1.00 mg/dL 0.84 0.76 0.66  Sodium 135 - 145 mmol/L 138 140 140  Potassium 3.5 - 5.1 mmol/L 3.4(L) 3.6 3.1(L)  Chloride 98 - 111 mmol/L 102 105 105  CO2 22 - 32 mmol/L 23 25 26   Calcium  8.9 - 10.3 mg/dL 9.0 9.0 8.9  Total Protein 6.5 - 8.1 g/dL - - -  Total Bilirubin 0.3 - 1.2 mg/dL - - -  Alkaline Phos 38 - 126 U/L - - -  AST 15 - 41 U/L - - -  ALT 0 - 44 U/L - - -   MRI Brain 08/31/2019: IMPRESSION: 1. No acute intracranial abnormality. 2. Continued stable non-contrast MRI appearance of the brain since 2019, including small chronic left cerebellar infarct.  MRI Brain 08/29/2019: IMPRESSION: 1. No acute finding or change from 2019. 2. Mild small vessel ischemic type change in the cerebral white matter. Small remote left cerebellar infarct.  DG Shoulder Right 08/31/2019: IMPRESSION: No fracture or dislocation of the right shoulder.  Discharge Instructions: Discharge Instructions    Call MD for:  persistant dizziness or light-headedness   Complete by: As directed    Diet - low sodium heart healthy   Complete by: As directed    Discharge instructions   Complete by: As directed    Please be sure to call the clinic you  have requested and schedule an appointment. We have provided you cyclobenzaprine for the neck stiffness, meclizine and diazepam for the dizziness, and Zofran for the nausea.  Please continue to take the medications you were admitted on daily as well as the medications as needed.   Increase activity slowly   Complete by: As directed       Signed: Kathi Ludwig, MD 09/02/2019, 10:21 AM   Pager: [phone]

## 2019-09-01 NOTE — Progress Notes (Signed)
Physical Therapy Treatment Patient Details Name: Megan Dixon MRN: [id number] DOB: October 28, 1966 Today's Date: 09/01/2019    History of Present Illness 53 y.o. female admitted on 08/29/19 for MSpersitent vertigo.  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  Pt with significant PMH of MS, morbid obesity, HTN, fibromyalgia, depression arthritis, OSA, L TKA.      PT Comments    Pt improved significantly today compared to yesterday, she is moving more quickly, doing her x 1 exercises throughout the day, and was able to get into the hallway with assist.  She continues to have dizziness, neck pain and HA, but it is slowly improving.  We discussed moving for short periods of time when she first goes home so that she doesn't make herself feel sick by doing too much.  PT will continue to follow acutely for safe mobility progression.  Plan to review x 1 exercises and to do stairs next session in preparation for d/c soon.     Follow Up Recommendations  Home health PT;Supervision - Intermittent;Other (comment)(vestibular PT if able)     Equipment Recommendations  None recommended by PT    Recommendations for Other Services   NA     Precautions / Restrictions Precautions Precautions: Fall Precaution Comments: unsteady due to dizziness    Mobility  Bed Mobility Overal bed mobility: Modified Independent                Transfers Overall transfer level: Needs assistance Equipment used: Rolling walker (2 wheeled) Transfers: Sit to/from Stand Sit to Stand: Supervision         General transfer comment: supervision for safety  Ambulation/Gait Ambulation/Gait assistance: Min guard Gait Distance (Feet): 75 Feet Assistive device: Rolling walker (2 wheeled) Gait Pattern/deviations: Step-through pattern;Shuffle Gait velocity: decreased(but improved over  last session) Gait velocity interpretation: 1.31 - 2.62 ft/sec, indicative of limited community ambulator General Gait Details: slow, steady gait with AD, stops on occasions to look at her "A" taped to the front of her walker.           Balance Overall balance assessment: Needs assistance Sitting-balance support: No upper extremity supported;Feet supported Sitting balance-Leahy Scale: Good     Standing balance support: Bilateral upper extremity supported Standing balance-Leahy Scale: Poor Standing balance comment: needs external support from AD in standing.                             Cognition Arousal/Alertness: Awake/alert Behavior During Therapy: WFL for tasks assessed/performed Overall Cognitive Status: Within Functional Limits for tasks assessed                                        Exercises Other Exercises Other Exercises: Pt reports compliance with x 1 exercises, reports " I do until I can't and then I stop and rest"        Pertinent Vitals/Pain Pain Assessment: Faces Faces Pain Scale: Hurts little more Pain Location: posterior neck and head  Pain Descriptors / Indicators: Aching Pain Intervention(s): Limited activity within patient's tolerance;Monitored during session;Repositioned       Prior Function            PT Goals (current goals can now be found  in the care plan section) Acute Megan PT Goals Patient Stated Goal: to get back home to her 22 y.o. grandson Progress towards PT goals: Progressing toward goals    Frequency    Min 4X/week      PT Plan Current plan remains appropriate      AM-PAC PT "6 Clicks" Mobility   Outcome Measure  Help needed turning from your back to your side while in a flat bed without using bedrails?: None Help needed moving from lying on your back to sitting on the side of a flat bed without using bedrails?: None Help needed moving to and from a bed to a chair (including a wheelchair)?:  None Help needed standing up from a chair using your arms (e.g., wheelchair or bedside chair)?: None Help needed to walk in hospital room?: A Little Help needed climbing 3-5 steps with a railing? : A Little 6 Click Score: 22    End of Session   Activity Tolerance: Patient limited by fatigue;Patient limited by pain Patient left: in chair;with call bell/phone within reach   PT Visit Diagnosis: Dizziness and giddiness (R42);Difficulty in walking, not elsewhere classified (R26.2)     Time: [id number] PT Time Calculation (min) (ACUTE ONLY): 22 min  Charges:  $Gait Training: 8-22 mins                    Nakeesha Bowler B. Marva Hendryx, PT, DPT  Acute Rehabilitation [phone] pager [phone] office  @ [facility]: [phone]   09/01/2019, 5:14 PM

## 2019-09-01 NOTE — Progress Notes (Signed)
  Subjective:  Patient seen at the bedside on rounds this AM. The patient says she still has a HA on the right side of her head and the back of her neck. She says the Toradol makes her feel anxious. The dizziness has improved some. She is complaining of heart palpitations but does report the valium helps that. Has been having some associated SOB as well. She thinks this may be related to anxiety. Does endorse having some dry heaves. Overall, she feels better than when she came in to the hospital. We reassured the patient the repeat MRI did not show any new strokes.    Objective:    Vital Signs (last 24 hours): Vitals:   08/31/19 0612 08/31/19 1320 08/31/19 2056 09/01/19 0449  BP: 120/65 (!) 156/91 129/81 (!) 165/90  Pulse: 99 (!) 102 94 (!) 105  Resp: 16 18 18 19   Temp: 98.4 F (36.9 C) 98.3 F (36.8 C) 98.4 F (36.9 C) 98.1 F (36.7 C)  TempSrc: Oral Oral Oral Oral  SpO2: 98% 96% 95% 97%   General: A/O x4, in no acute distress, afebrile, nondiaphoretic HEENT: PEERL, EMO intact Cardio: RRR, no mrg's  Pulmonary: CTA bilaterally, no wheezing or crackles  Abdomen: Bowel sounds normal, soft, nontender  MSK: BLE nontender, nonedematous Psych: Appropriate affect, not depressed in appearance, engages well  Assessment/Plan:   Principal Problem:   Acute vestibular syndrome Active Problems:   Hypertension   Ischemic vascular disease   Migraine  Patient is a 53 year old female with past medical history of 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  Patient with persistent symptoms.  Due to patient's symptoms, patient is currently unable to safely ambulate without supervision.  Acute vestibular syndrome:  Likely vestibular neuritis based on the length of time she has had persistent motion induced symptoms in conjunction with the leftward gaze horizontal nystagmus and the lack of response to initial therapy in the setting of  two negative MRI's of the brain. We will initiate therapy with corticosteroids and continue symptomatic therapy. -Continue Diazepam 2 mg every 12 hours as needed -Continue Prochlorperazine 10 mg IV every 8 hour as needed -Discontinue Toradol 30 mg Q8HR  -Continue Tylenol 650 mg Q6HR -Meclizine (Antivert) 25 mg 3 times daily -Vestibular PT -Prednisone 60mg  daily today  Right shoulder pain:  Reports pain since injury prior to presentation.  Pain has improved with medications.  X-ray of shoulder shows no fracture or dislocation. -Voltaren gel prn  HTN:  Chronic, combined systolic and diastolic heart failure: will continue home medications.  -Amlodipine 5 mg daily, carvedilol 6.25 mg twice daily, losartan 50 mg daily, Lasix 20 mg daily  History of left cerebellar infarct: -Atorvastatin 40 mg daily + clopidogrel 75 mg daily  Diet: Heart DVT Ppx: Lovenox Dispo: Anticipated discharge pending clinical improvement  Earlene Plater, MD 09/01/2019, 9:08 AM Pager: [phone]

## 2019-09-02 DIAGNOSIS — G588 Other specified mononeuropathies: Secondary | ICD-10-CM

## 2019-09-02 DIAGNOSIS — Z91018 Allergy to other foods: Secondary | ICD-10-CM

## 2019-09-02 LAB — BASIC METABOLIC PANEL
Anion gap: 13 (ref 5–15)
BUN: 16 mg/dL (ref 6–20)
CO2: 23 mmol/L (ref 22–32)
Calcium: 9 mg/dL (ref 8.9–10.3)
Chloride: 102 mmol/L (ref 98–111)
Creatinine, Ser: 0.84 mg/dL (ref 0.44–1.00)
GFR calc Af Amer: >60 mL/min (ref >60)
GFR calc non Af Amer: >60 mL/min (ref >60)
Glucose, Bld: 115 mg/dL — ABNORMAL HIGH (ref 70–99)
Potassium: 3.4 mmol/L — ABNORMAL LOW (ref 3.5–5.1)
Sodium: 138 mmol/L (ref 135–145)

## 2019-09-02 MED ORDER — ONDANSETRON 4 MG PO TBDP: 4.0000 mg | ORAL_TABLET | Freq: Three times a day (TID) | ORAL | 0 refills | Status: DC | PRN

## 2019-09-02 MED ORDER — CYCLOBENZAPRINE HCL 10 MG PO TABS: 10.0000 mg | ORAL_TABLET | Freq: Three times a day (TID) | ORAL | 0 refills | Status: DC | PRN

## 2019-09-02 MED ORDER — PREDNISONE 20 MG PO TABS: ORAL_TABLET | ORAL | 0 refills | Status: AC

## 2019-09-02 MED ORDER — CYCLOBENZAPRINE HCL 5 MG PO TABS
5.0000 mg | ORAL_TABLET | Freq: Three times a day (TID) | ORAL | Status: DC | PRN
  Administered 2019-09-02: 5 mg via ORAL
  Filled 2019-09-02: qty 1

## 2019-09-02 MED ORDER — MECLIZINE HCL 25 MG PO TABS: 25.0000 mg | ORAL_TABLET | Freq: Three times a day (TID) | ORAL | 0 refills | Status: DC | PRN

## 2019-09-02 MED ORDER — DIAZEPAM 2 MG PO TABS: 2.0000 mg | ORAL_TABLET | Freq: Two times a day (BID) | ORAL | 0 refills | Status: DC | PRN

## 2019-09-02 MED ORDER — IBUPROFEN 800 MG PO TABS: 800.0000 mg | ORAL_TABLET | Freq: Three times a day (TID) | ORAL | 0 refills | Status: DC | PRN

## 2019-09-02 NOTE — Progress Notes (Signed)
  Subjective:  Patient seen sitting in chair this morning.  Patient has been able to ambulate around the room without difficulty.  Patient reports that the dizziness is significantly improved, and she is continue with the exercises that vestibular PT instructed her on.  Patient has some nausea but is able to tolerate food and water.  Patient is comfortable with going home today.  Due to convenience of location, patient is interested in being seen in family medicine clinic for follow-up.  Objective:    Vital Signs (last 24 hours): Vitals:   09/01/19 1705 09/01/19 2100 09/02/19 0546 09/02/19 0905  BP: (!) 140/105 (!) 123/91 (!) 162/95 (!) 155/105  Pulse:  93 (!) 101 (!) 116  Resp:  16 18   Temp:  97.6 F (36.4 C) 97.9 F (36.6 C)   TempSrc:  Oral Oral   SpO2:  99% 97% 100%    Physical Exam: General Alert and answers questions appropriately, no acute distress  Cardiac Regular rate and rhythm, no murmurs, rubs, or gallops  Pulmonary Clear to auscultation bilaterally without wheezes, rhonchi, or rales  Extremities No peripheral edema    Assessment/Plan:   Principal Problem:   Acute vestibular syndrome Active Problems:   Hypertension   Ischemic vascular disease   Migraine  Patient is a 53 year old female with past medical history significant for 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  Patient has significant symptomatic improvement today.  # Acute vestibular syndrome: Likely vestibular neuritis given sustained persistent motion induced symptoms, lack of response to initial therapy, and leftward gaze horizontal nystagmus on presentation.  With initiation of prednisone, patient symptoms significantly improved, and today patient able to ambulate without difficulty, tolerate p.o. intake.  Patient has home health vestibular PT arranged.  Will discharge with prednisone taper and Valium as needed for vertigo.  #  Neck/shoulder pain: Patient initially with right shoulder pain after hitting it on a door shortly before presentation, radiograph was negative for fracture/dislocation and pain improved with Toradol.  Patient also had improvement with Flexeril.  Patient discharged with PRN Flexeril and Motrin.  Patient says she cannot take Motrin but contraindication or allergy is not present.  Dispo: Anticipated discharge today  Jeanmarie Hubert, MD 09/02/2019, 10:38 AM Pager: [phone]

## 2019-09-02 NOTE — Discharge Instructions (Signed)
Thank you for your visit to the [facility]. Please continue the therapy as recommended by the physical therapist.

## 2019-09-02 NOTE — Progress Notes (Signed)
Patient has not had morning medications at this time. MD aware of BP and pulse, MD currently at bedside. Will continue to monitor.

## 2019-09-02 NOTE — Progress Notes (Addendum)
Patient discharged to home. Verbalizes understanding of all discharge instructions including discharge medications and follow up MD visits. Patient awaiting ride.   1338- Confirmed with patient her ride is on the way. She states her son is no longer answering and her daughter is now on the way.  1345- Patient transported via wheelchair to be discharged.

## 2019-09-24 ENCOUNTER — Other Ambulatory Visit: Payer: Self-pay | Admitting: Internal Medicine

## 2019-09-30 ENCOUNTER — Other Ambulatory Visit: Payer: Self-pay | Admitting: Family Medicine

## 2019-10-04 ENCOUNTER — Other Ambulatory Visit: Payer: Self-pay | Admitting: Cardiology

## 2019-10-10 ENCOUNTER — Emergency Department (HOSPITAL_COMMUNITY): Payer: Medicare Other

## 2019-10-10 ENCOUNTER — Other Ambulatory Visit: Payer: Self-pay

## 2019-10-10 ENCOUNTER — Emergency Department (HOSPITAL_COMMUNITY)
Admission: EM | Admit: 2019-10-10 | Discharge: 2019-10-10 | Disposition: A | Discharge status: Home / Self Care | Payer: Medicare Other | Attending: Emergency Medicine | Admitting: Emergency Medicine

## 2019-10-10 DIAGNOSIS — M25511 Pain in right shoulder: Secondary | ICD-10-CM | POA: Diagnosis present

## 2019-10-10 DIAGNOSIS — M25521 Pain in right elbow: Secondary | ICD-10-CM | POA: Insufficient documentation

## 2019-10-10 DIAGNOSIS — W19XXXA Unspecified fall, initial encounter: Secondary | ICD-10-CM

## 2019-10-10 DIAGNOSIS — F17210 Nicotine dependence, cigarettes, uncomplicated: Secondary | ICD-10-CM | POA: Insufficient documentation

## 2019-10-10 DIAGNOSIS — Z79899 Other long term (current) drug therapy: Secondary | ICD-10-CM | POA: Insufficient documentation

## 2019-10-10 DIAGNOSIS — M25561 Pain in right knee: Secondary | ICD-10-CM | POA: Insufficient documentation

## 2019-10-10 DIAGNOSIS — I10 Essential (primary) hypertension: Secondary | ICD-10-CM | POA: Diagnosis not present

## 2019-10-10 DIAGNOSIS — Z91010 Allergy to peanuts: Secondary | ICD-10-CM | POA: Insufficient documentation

## 2019-10-10 DIAGNOSIS — Z7901 Long term (current) use of anticoagulants: Secondary | ICD-10-CM | POA: Insufficient documentation

## 2019-10-10 MED ORDER — HYDROCODONE-ACETAMINOPHEN 5-325 MG PO TABS
1.0000 | ORAL_TABLET | Freq: Once | ORAL | Status: AC
  Administered 2019-10-10: 1 via ORAL
  Filled 2019-10-10: qty 1

## 2019-10-10 NOTE — ED Notes (Signed)
Patient transported to X-ray 

## 2019-10-10 NOTE — ED Notes (Signed)
Pt asleep at this time. Will hold Vicodin and will continue to monitor.

## 2019-10-10 NOTE — ED Notes (Signed)
Patient verbalizes understanding of discharge instructions. Opportunity for questioning and answers were provided. Armband removed by staff, pt discharged from ED.  

## 2019-10-10 NOTE — ED Triage Notes (Signed)
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 Complains of right shoulder and knee pain. Minimal weight bearing. Prior knee surgery to L knee, sts R knee "has been giving out" over the last year.

## 2019-10-10 NOTE — ED Provider Notes (Signed)
[facility] EMERGENCY DEPARTMENT Provider Note   CSN: [place]:[id number] Arrival date & time: 10/10/19  1004     History   Chief Complaint No chief complaint on file.   HPI Megan Dixon is a 53 y.o. female.      Fall This is a new problem. The current episode started 1 to 2 hours ago. Episode frequency: once. The problem has not changed since onset.Pertinent negatives include no chest pain, no abdominal pain and no shortness of breath. The symptoms are aggravated by walking. Nothing relieves the symptoms. She has tried nothing for the symptoms. The treatment provided no relief.  Patient reports that she tripped at work and had a GLF. No head contact or LOC.  Past Medical History:  Diagnosis Date   Arthritis    Depression    Fibromyalgia    Hypertension    Lupus ([place])    a. was told bloodwork was possibly positive for lupus in 08/2018 by PCP.   Morbid obesity ([place])    MS (multiple sclerosis) ([place])    a. presumptive dx in [place], [place]   OCD (obsessive compulsive disorder)    OSA (obstructive sleep apnea)    a. dx recently, pending CPAP initiation.   Palpitations    Pneumonia 07/2018   left lung    Patient Active Problem List   Diagnosis Date Noted   Acute vestibular syndrome 08/29/2019   Ischemic vascular disease 08/29/2019   Migraine 08/29/2019   Morbid obesity ([place])    MS (multiple sclerosis) ([place]) 08/01/2018   Hypertension 08/01/2018   Fibromyalgia 08/01/2018   Blood-tinged sputum 08/01/2018   Incarcerated incisional hernia 08/10/2017   S/P exploratory laparotomy 08/09/2017    Past Surgical History:  Procedure Laterality Date   ABDOMINAL SURGERY     CARPAL TUNNEL RELEASE     HERNIA REPAIR     JOINT REPLACEMENT     VENTRAL HERNIA REPAIR N/A 08/09/2017   Procedure: Incarcerated ventral hernia repair with mesh ;  Surgeon: Rolm Bookbinder, MD;  Location: [facility];  Service: General;  Laterality: N/A;     OB  History   No obstetric history on file.      Home Medications    Prior to Admission medications   Medication Sig Start Date End Date Taking? Authorizing Provider  amLODipine (NORVASC) 5 MG tablet Take 1 tablet (5 mg total) by mouth daily. 09/25/18   Mullis, Kiersten P, DO  atorvastatin (LIPITOR) 40 MG tablet Take 1 tablet (40 mg total) by mouth daily. 09/24/18   Mullis, Kiersten P, DO  carvedilol (COREG) 6.25 MG tablet Take 1 tablet (6.25 mg total) by mouth 2 (two) times daily with a meal. 09/24/18   Mullis, Kiersten P, DO  clopidogrel (PLAVIX) 75 MG tablet Take 1 tablet (75 mg total) by mouth daily. 09/25/18   Mullis, Kiersten P, DO  cyclobenzaprine (FLEXERIL) 10 MG tablet Take 1 tablet (10 mg total) by mouth 3 (three) times daily as needed for muscle spasms. 09/02/19   Kathi Ludwig, MD  diazepam (VALIUM) 2 MG tablet Take 1 tablet (2 mg total) by mouth every 12 (twelve) hours as needed (Dizziness). 09/02/19   Kathi Ludwig, MD  FLUoxetine (PROZAC) 20 MG capsule Take 60 mg by mouth daily.    [provider]  furosemide (LASIX) 20 MG tablet Take 20 mg by mouth every morning. 09/07/18   [provider]  gabapentin (NEURONTIN) 600 MG tablet Take 600 mg by mouth 3 (three) times daily.  [provider]  ibuprofen (ADVIL) 800 MG tablet Take 1 tablet (800 mg total) by mouth every 8 (eight) hours as needed for headache. 09/02/19   Kathi Ludwig, MD  losartan (COZAAR) 50 MG tablet TAKE 1 TABLET BY MOUTH EVERY DAY 10/04/19   Buford Dresser, MD  meclizine (ANTIVERT) 25 MG tablet Take 1 tablet (25 mg total) by mouth 3 (three) times daily as needed for dizziness. 09/02/19   Kathi Ludwig, MD  ondansetron (ZOFRAN ODT) 4 MG disintegrating tablet Take 1 tablet (4 mg total) by mouth every 8 (eight) hours as needed for nausea or vomiting. 09/02/19   Kathi Ludwig, MD  pantoprazole (PROTONIX) 40 MG tablet Take 40 mg by mouth daily.    [provider]    Family History Family History  Problem Relation Age of Onset   Lung cancer Mother    Seizures Brother    Heart attack Brother        Happened during seizure    Social History Social History   Tobacco Use   Smoking status: Current Every Day Smoker    Types: Cigarettes   Smokeless tobacco: Never Used   Tobacco comment: Smoked for 40 years, quit in 08/2018  Substance Use Topics   Alcohol use: Not Currently   Drug use: Never     Allergies   Morphine and related, Other, Peanut-containing drug products, and Toradol [ketorolac tromethamine]   Review of Systems Review of Systems  Constitutional: Negative for chills and fever.  HENT: Negative for ear pain and sore throat.   Eyes: Negative for pain and visual disturbance.  Respiratory: Negative for cough and shortness of breath.   Cardiovascular: Negative for chest pain and palpitations.  Gastrointestinal: Negative for abdominal pain and vomiting.  Genitourinary: Negative for dysuria and hematuria.  Musculoskeletal: Negative for arthralgias and back pain.  Skin: Negative for color change and rash.  Neurological: Negative for seizures and syncope.  All other systems reviewed and are negative.    Physical Exam Updated Vital Signs BP (!) 136/92 (BP Location: Left Arm)    Pulse 92    Temp 97.9 F (36.6 C) (Oral)    Resp 16    SpO2 98%   Physical Exam Vitals signs and nursing note reviewed.  Constitutional:      General: She is not in acute distress.    Appearance: She is well-developed. She is obese.  HENT:     Head: Normocephalic and atraumatic.  Eyes:     Conjunctiva/sclera: Conjunctivae normal.  Neck:     Musculoskeletal: Neck supple.  Cardiovascular:     Rate and Rhythm: Normal rate and regular rhythm.     Heart sounds: No murmur.  Pulmonary:     Effort: Pulmonary effort is normal. No respiratory distress.     Breath sounds: Normal breath sounds.  Abdominal:     Palpations: Abdomen  is soft.     Tenderness: There is no abdominal tenderness.  Musculoskeletal: Normal range of motion.        General: Tenderness (TTP R elbow, R knee, and R shoulder. Pain with ROM in those areas as well but is able to complete full ROM in all extremities.) present. No deformity.  Skin:    General: Skin is warm and dry.  Neurological:     General: No focal deficit present.     Mental Status: She is alert.     Comments: NVI in all extremities.      ED Treatments / Results  Labs (all labs ordered are listed, but only abnormal results are displayed) Labs Reviewed - No data to display  EKG None  Radiology Dg Shoulder Right  Result Date: 10/10/2019 CLINICAL DATA:  Right shoulder pain after fall today. EXAM: RIGHT SHOULDER - 2+ VIEW COMPARISON:  August 31, 2019. FINDINGS: There is no evidence of fracture or dislocation. There is no evidence of arthropathy or other focal bone abnormality. Soft tissues are unremarkable. IMPRESSION: Negative. Electronically Signed   By: Marijo Conception M.D.   On: 10/10/2019 12:56   Dg Elbow 2 Views Right  Result Date: 10/10/2019 CLINICAL DATA:  Right elbow pain after fall. EXAM: RIGHT ELBOW - 2 VIEW COMPARISON:  None. FINDINGS: There is no evidence of fracture, dislocation, or joint effusion. There is no evidence of arthropathy or other focal bone abnormality. Soft tissues are unremarkable. IMPRESSION: Negative. Electronically Signed   By: Marijo Conception M.D.   On: 10/10/2019 12:56   Dg Knee 2 Views Right  Result Date: 10/10/2019 CLINICAL DATA:  Right knee pain after fall today. EXAM: RIGHT KNEE - 1-2 VIEW COMPARISON:  None. FINDINGS: No evidence of fracture, dislocation, or joint effusion. Mild narrowing of medial joint space is noted. Soft tissues are unremarkable. IMPRESSION: Mild degenerative joint disease is noted medially. No acute abnormality seen in the right knee. Electronically Signed   By: Marijo Conception M.D.   On: 10/10/2019 12:54     Procedures Procedures (including critical care time)  Medications Ordered in ED Medications  HYDROcodone-acetaminophen (NORCO/VICODIN) 5-325 MG per tablet 1 tablet (1 tablet Oral Given 10/10/19 1346)     Initial Impression / Assessment and Plan / ED Course  I have reviewed the triage vital signs and the nursing notes.  Pertinent labs & imaging results that were available during my care of the patient were reviewed by me and considered in my medical decision making (see chart for details).        Patient is a 53 year old female with history of physical exam as above presents emergency department for evaluation of right knee, shoulder, and elbow pain after she fell on a toy at daycare this morning.  This was a ground-level fall.  X-rays were obtained of the affected extremities which demonstrated no emergent findings and no fractures or other acute traumatic injury.  Patient was given a dose of p.o. pain medication in the emergency department and discharged home with anticipatory guidance and strict return precautions related to fall without acute fracture identified on imaging.  She verbalized understanding of plan.  Patient was ambulatory in the emergency department without difficulty.  Range of motion improved once no fractures were noted on imaging.  Patient was discharged in stable condition.  Recommended close PCP follow-up as needed.  Patient was seen in conjunction with my attending physician, Dr. Sedonia Small.  Final Clinical Impressions(s) / ED Diagnoses   Final diagnoses:  Acute pain of right knee  Acute pain of right shoulder  Right elbow pain    ED Discharge Orders    None       Romona Curls, MD 10/10/19 1714    Maudie Flakes, MD 10/11/19 1215

## 2019-10-31 ENCOUNTER — Other Ambulatory Visit: Payer: Self-pay | Admitting: Cardiology

## 2019-11-23 ENCOUNTER — Encounter: Payer: Self-pay | Admitting: Family Medicine

## 2019-11-23 ENCOUNTER — Other Ambulatory Visit: Payer: Self-pay

## 2019-11-23 ENCOUNTER — Ambulatory Visit (INDEPENDENT_AMBULATORY_CARE_PROVIDER_SITE_OTHER): Payer: Medicare Other | Admitting: Family Medicine

## 2019-11-23 ENCOUNTER — Other Ambulatory Visit: Payer: Self-pay | Admitting: Family Medicine

## 2019-11-23 ENCOUNTER — Telehealth: Payer: Self-pay

## 2019-11-23 VITALS — BP 138/92 | HR 103 | Wt 280.4 lb

## 2019-11-23 DIAGNOSIS — G629 Polyneuropathy, unspecified: Secondary | ICD-10-CM | POA: Diagnosis not present

## 2019-11-23 DIAGNOSIS — R7303 Prediabetes: Secondary | ICD-10-CM | POA: Insufficient documentation

## 2019-11-23 DIAGNOSIS — N6331 Unspecified lump in axillary tail of the right breast: Secondary | ICD-10-CM

## 2019-11-23 DIAGNOSIS — R2231 Localized swelling, mass and lump, right upper limb: Secondary | ICD-10-CM | POA: Diagnosis not present

## 2019-11-23 MED ORDER — LOSARTAN POTASSIUM 50 MG PO TABS: 50.0000 mg | ORAL_TABLET | Freq: Every day | ORAL | 0 refills | Status: DC

## 2019-11-23 MED ORDER — MECLIZINE HCL 25 MG PO TABS: 25.0000 mg | ORAL_TABLET | Freq: Three times a day (TID) | ORAL | 0 refills | Status: DC | PRN

## 2019-11-23 MED ORDER — CLOPIDOGREL BISULFATE 75 MG PO TABS: 75.0000 mg | ORAL_TABLET | Freq: Every day | ORAL | 0 refills | Status: DC

## 2019-11-23 MED ORDER — AMLODIPINE BESYLATE 5 MG PO TABS: 5.0000 mg | ORAL_TABLET | Freq: Every day | ORAL | 0 refills | Status: DC

## 2019-11-23 MED ORDER — ATORVASTATIN CALCIUM 40 MG PO TABS: 40.0000 mg | ORAL_TABLET | Freq: Every day | ORAL | 0 refills | Status: DC

## 2019-11-23 MED ORDER — FLUOXETINE HCL 20 MG PO CAPS: 60.0000 mg | ORAL_CAPSULE | Freq: Every day | ORAL | 0 refills | Status: DC

## 2019-11-23 MED ORDER — CARVEDILOL 6.25 MG PO TABS: 6.2500 mg | ORAL_TABLET | Freq: Two times a day (BID) | ORAL | 0 refills | Status: DC

## 2019-11-23 MED ORDER — FUROSEMIDE 20 MG PO TABS: 20.0000 mg | ORAL_TABLET | ORAL | 0 refills | Status: DC

## 2019-11-23 MED ORDER — GABAPENTIN 600 MG PO TABS: 600.0000 mg | ORAL_TABLET | Freq: Three times a day (TID) | ORAL | 0 refills | Status: DC

## 2019-11-23 MED ORDER — CYCLOBENZAPRINE HCL 10 MG PO TABS: 10.0000 mg | ORAL_TABLET | Freq: Three times a day (TID) | ORAL | 0 refills | Status: DC | PRN

## 2019-11-23 MED ORDER — PANTOPRAZOLE SODIUM 40 MG PO TBEC: 40.0000 mg | DELAYED_RELEASE_TABLET | Freq: Every day | ORAL | 0 refills | Status: DC

## 2019-11-23 NOTE — Assessment & Plan Note (Signed)
Patient noted lump in the right axilla.  It feels like palpable axillary node.  Patient reports she has not had a mammogram in "a while". -Ultrasound ordered of right axilla at [facility] breast center -Mammogram ordered

## 2019-11-23 NOTE — Patient Instructions (Signed)
Today we got you checked in as a new patient.  We did a full history and physical.  I put lab work orders in for cholesterol and diabetes labs.  I also put a lab in to check your kidney function.  Also ordered a mammogram for you.  [facility] will be calling to schedule it.  I will call you with your test results and we will discuss where to go from there.  I would like for you to follow-up in approximately 2 weeks to discuss any changes we need to make regarding medications.  I hope you had a wonderful day and it was a pleasure to meet you.

## 2019-11-23 NOTE — Telephone Encounter (Signed)
Called and LVM for patient with information regarding her Diagnostic mammogram 11/29/2019 at 0850 and the ultrasound if needed at 0900.  [facility] [address] [place] [phone]   .Ozella Almond, CMA'

## 2019-11-23 NOTE — Assessment & Plan Note (Signed)
Patient's most recent hemoglobin A1c was 5.8. -We will recheck her hemoglobin A1c -CMP -Lipid panel

## 2019-11-23 NOTE — Progress Notes (Signed)
Subjective:  Megan Dixon is a 53 y.o. female who presents to the [facility] today for a new patient visit.Marland Kitchen   HPI: Patient has no concerns at this time.  She reports no recent illnesses.  Was new patient visit because she has not been able to fill her prescriptions because she has not had a primary care provider.  On review of systems it was noted that patient had a lump in her right axilla.  She reports that she is also had breast tenderness but denies any lumps in her breasts.  She says that she has not had a mammogram in "a while".  Past medical history Patient reports that she has a history of fibromyalgia, connective tissue, hypertension, peripheral neuropathy, hyperlipidemia, acid reflux, anxiety, vertigo, history of CVA.  [place]  Patient's past surgical history consist of an exploratory laparotomy after coming in with an incarcerated hernia which she says happened in 2017.  Family Hx Patient has an extensive family history of cancer.  Her mother had leukemia, her father had throat cancer, 1 brother had lung cancer, another brother had prostate cancer, she has a sister with leukemia.  Patient also has a sister with lupus.  She reports that "everyone in her family has hypertension".  Social history Patient reports she exercises regularly.  She walks for approximately an hour a day every day through her neighborhood.  Patient lives at home alone.  She has a son and a daughter.  She also has a grandson who visits regularly.  Patient reports she is disabled but she was volunteering with "[employer]" but she has been unable to do so recently because she fell and had a knee injury.  She says that she was seen by orthopedics who said that she may need surgery but that she first needs to lose some weight.  Patient reports she does not drink alcohol.  Patient smokes 1/2 pack of cigarettes a day and has done so for 3 years.  She says that she would like assistance with quitting and is interested  in nicotine patches.  We will address this at her next office visit.  Patient denies any other drug use.  Objective:  Physical Exam: BP (!) 138/92    Pulse (!) 103    Wt 280 lb 6.4 oz (127.2 kg)    SpO2 97%    BMI 52.98 kg/m   Gen: NAD, resting comfortably, sitting in a chair in the exam room.  Obese CV: RRR with no murmurs appreciated  Pulm: NWOB, CTAB with no crackles, wheezes, or rhonchi GI: Normal bowel sounds present. Soft, Nontender, Nondistended.  Large scar from xiphoid to pubic bone. MSK: no edema, cyanosis, or clubbing noted, patient has a notable lump in her right axilla which is nontender but patient pointed out. Skin: warm, dry Neuro: grossly normal, moves all extremities Psych: Normal affect and thought content patient voices that she has had issues with depression in the past but is doing well at this time.  She is anxious because she has not had her medications in a "a long time"   No results found for this or any previous visit (from the past 72 hour(s)).   Assessment/Plan:  Prediabetes Patient's most recent hemoglobin A1c was 5.8. -We will recheck her hemoglobin A1c -CMP -Lipid panel  Axillary lump, right Patient noted lump in the right axilla.  It feels like palpable axillary node.  Patient reports she has not had a mammogram in "a while". -Ultrasound ordered of  right axilla at [facility] -Mammogram ordered    Lab Orders     Comprehensive metabolic panel     Lipid Panel     Hemoglobin A1c  No orders of the defined types were placed in this encounter.     Gifford Shave, MD  PGY-1, [facility]  11/23/19 5:57 PM

## 2019-11-24 ENCOUNTER — Telehealth: Payer: Self-pay | Admitting: Family Medicine

## 2019-11-24 LAB — COMPREHENSIVE METABOLIC PANEL
ALT: 26 IU/L (ref 0–32)
AST: 20 IU/L (ref 0–40)
Albumin/Globulin Ratio: 1.3 (ref 1.2–2.2)
Albumin: 4 g/dL (ref 3.8–4.9)
Alkaline Phosphatase: 143 IU/L — ABNORMAL HIGH (ref 39–117)
BUN/Creatinine Ratio: 16 (ref 9–23)
BUN: 13 mg/dL (ref 6–24)
Bilirubin Total: 0.2 mg/dL (ref 0.0–1.2)
CO2: 22 mmol/L (ref 20–29)
Calcium: 9.8 mg/dL (ref 8.7–10.2)
Chloride: 106 mmol/L (ref 96–106)
Creatinine, Ser: 0.81 mg/dL (ref 0.57–1.00)
GFR calc Af Amer: 95 mL/min/{1.73_m2} (ref >59)
GFR calc non Af Amer: 82 mL/min/{1.73_m2} (ref >59)
Globulin, Total: 3.1 g/dL (ref 1.5–4.5)
Glucose: 98 mg/dL (ref 65–99)
Potassium: 4.2 mmol/L (ref 3.5–5.2)
Sodium: 144 mmol/L (ref 134–144)
Total Protein: 7.1 g/dL (ref 6.0–8.5)

## 2019-11-24 LAB — LIPID PANEL
Chol/HDL Ratio: 4.5 ratio — ABNORMAL HIGH (ref 0.0–4.4)
Cholesterol, Total: 156 mg/dL (ref 100–199)
HDL: 35 mg/dL — ABNORMAL LOW (ref >39)
LDL Chol Calc (NIH): 88 mg/dL (ref 0–99)
Triglycerides: 191 mg/dL — ABNORMAL HIGH (ref 0–149)
VLDL Cholesterol Cal: 33 mg/dL (ref 5–40)

## 2019-11-24 LAB — HEMOGLOBIN A1C
Est. average glucose Bld gHb Est-mCnc: 123 mg/dL
Hgb A1c MFr Bld: 5.9 % — ABNORMAL HIGH (ref 4.8–5.6)

## 2019-11-24 NOTE — Telephone Encounter (Signed)
I attempted to call patient to catheter lab results from yesterday's visit.  Patient answered the first time I cannot hear the patient.  I retried the call and was sent to voicemail.  I left a message saying that I was just calling to give her results and there was nothing extremely concerning and that I would call back later to try and further discuss them.

## 2019-11-26 ENCOUNTER — Telehealth: Payer: Self-pay | Admitting: Family Medicine

## 2019-11-26 NOTE — Telephone Encounter (Signed)
I spoke with patient regarding her lab results.  I let her know that her hemoglobin A1c was 5.9 from 5.8 which means she still prediabetes and she can control it with her diet.  Also let her know about her lipid panel and told her to continue taking her atorvastatin.  She had no questions or concerns.

## 2019-11-29 ENCOUNTER — Other Ambulatory Visit: Payer: Medicare Other

## 2019-12-04 ENCOUNTER — Other Ambulatory Visit: Payer: Self-pay | Admitting: Family Medicine

## 2019-12-06 ENCOUNTER — Other Ambulatory Visit: Payer: Self-pay

## 2019-12-06 ENCOUNTER — Ambulatory Visit
Admission: RE | Admit: 2019-12-06 | Discharge: 2019-12-06 | Disposition: A | Discharge status: Home / Self Care | Payer: Medicare Other | Source: Ambulatory Visit | Attending: Family Medicine | Admitting: Family Medicine

## 2019-12-06 DIAGNOSIS — N6331 Unspecified lump in axillary tail of the right breast: Secondary | ICD-10-CM

## 2019-12-06 MED ORDER — LOSARTAN POTASSIUM 50 MG PO TABS: 50.0000 mg | ORAL_TABLET | Freq: Every day | ORAL | 0 refills | Status: DC

## 2019-12-11 ENCOUNTER — Telehealth: Payer: Self-pay | Admitting: Family Medicine

## 2019-12-11 NOTE — Telephone Encounter (Signed)
I called patient to update her on the results from her mammogram.  She was pleased to hear that her results showed that she had benign cyst and no concern for malignancy.  She had no further questions or concerns regarding her mammogram.  She says that she is going to call and make a follow-up appointment regarding some alopecia but no issues other than that.

## 2019-12-16 ENCOUNTER — Other Ambulatory Visit: Payer: Self-pay | Admitting: Family Medicine

## 2019-12-20 ENCOUNTER — Other Ambulatory Visit: Payer: Self-pay | Admitting: Family Medicine

## 2019-12-28 ENCOUNTER — Other Ambulatory Visit: Payer: Self-pay | Admitting: Cardiology

## 2020-01-01 ENCOUNTER — Ambulatory Visit (INDEPENDENT_AMBULATORY_CARE_PROVIDER_SITE_OTHER): Payer: Medicare Other | Admitting: Family Medicine

## 2020-01-01 ENCOUNTER — Other Ambulatory Visit: Payer: Self-pay

## 2020-01-01 ENCOUNTER — Encounter: Payer: Self-pay | Admitting: Family Medicine

## 2020-01-01 VITALS — BP 120/78 | HR 113 | Ht 61.0 in | Wt 289.2 lb

## 2020-01-01 DIAGNOSIS — M79604 Pain in right leg: Secondary | ICD-10-CM

## 2020-01-01 DIAGNOSIS — R600 Localized edema: Secondary | ICD-10-CM | POA: Insufficient documentation

## 2020-01-01 DIAGNOSIS — M79605 Pain in left leg: Secondary | ICD-10-CM

## 2020-01-01 DIAGNOSIS — M797 Fibromyalgia: Secondary | ICD-10-CM | POA: Diagnosis not present

## 2020-01-01 DIAGNOSIS — F329 Major depressive disorder, single episode, unspecified: Secondary | ICD-10-CM | POA: Diagnosis not present

## 2020-01-01 DIAGNOSIS — F32A Depression, unspecified: Secondary | ICD-10-CM | POA: Insufficient documentation

## 2020-01-01 MED ORDER — MEDICAL COMPRESSION STOCKINGS MISC: 1.0000 | Freq: Every day | 2 refills | Status: AC

## 2020-01-01 NOTE — Assessment & Plan Note (Signed)
Patient reports chronic lower extremity pain which she associates with her fibromyalgia but says that over the last 5 days it has gotten worse.  She reports bilateral lower extremity edema which gets better with elevation and worsens while standing.  Elevation helps decrease the swelling as well as decreases the pain.  She has been taking Tylenol for the pain.  She takes furosemide 20 mg in the morning to help with fluid management and reports no changes in urination.  She has never used any form of compression stockings but is open to trying them.  She denies any erythema or warmth in her lower extremities. -Discussed warning signs of DVT, patient voiced understanding and reported that she would seek emergency medical treatment if any of the signs presented themselves -Prescribed compression stockings to be worn daily -Refer to pain management for fibromyalgia optimization -Encouraged elevation of lower extremities as needed

## 2020-01-01 NOTE — Progress Notes (Signed)
Subjective:  Megan Dixon is a 54 y.o. female who presents to the [facility] today with a chief complaint of leg pain bilaterally hurting worse on left leg.  Also here to discuss lab work from last visit.  HPI: We started out a conversation by talking about patient's recent blood work.  I informed her that her A1c was 5.8 which is fantastic and she does not have diabetes.  We also talked about her lipid panel which came back within normal limits and I told her to continue her statin as prescribed.  Lower extremity pain Patient reports worsening lower extremity pain since last Thursday.  She has history of fibromyalgia and says that it may be related to that.  She says that the pain starts at her hips and moved down and around her leg it is a constant dull throbbing pain.  She said she occasionally feels shooting down her leg as well.  She has noticed that since Thursday her lower extremities have been more swollen bilaterally.  She takes Lasix 20 mg daily.  She is also been propping her legs up which she said helps relieve the pain but it returns when she begins to walk again and the swelling comes back.  She reports that she has been taking all of her other medications including Flexeril, Neurontin, and Tylenol around-the-clock to help with the pain.  It is stopping her from being able to walk with her grandson.  Patient reports she has never tried compression stockings or any hypodrip to help with the swelling.  Depression Patient reports that her depression is doing "all right".  She says that she gets sad when she is unable to do the things she enjoys such as walking with her grandson because of her lower extremity pain.  She is taking her Prozac as prescribed.  She denies any SI.  PHQ-9 today was 14.   Objective:  Physical Exam: BP 120/78   Pulse (!) 113   Ht 5\' 1"  (1.549 m)   Wt 289 lb 4 oz (131.2 kg)   SpO2 96%   BMI 54.65 kg/m   Gen: Patient appears well but is intermittently tearful  discussing her chronic pain. CV: RRR with no murmurs appreciated, initial pulse rate was 113 but on evaluation it was 88. Pulm: NWOB, CTAB with no crackles, wheezes, or rhonchi GI: Normal bowel sounds present. Soft, Nontender, Nondistended. MSK: Lower extremity pitting edema bilaterally up to the calves.  Worse on the right than the left but present in both legs.  No erythema or warmth. Skin: warm, dry Neuro: grossly normal, moves all extremities Psych: Occasionally tearful regarding her chronic pain.  Says that her depression is doing "okay".  No SI or HI   No results found for this or any previous visit (from the past 72 hour(s)).   Assessment/Plan:  Depression Patient reports history of depression for which she takes Prozac 60 mg daily.  She reports she has been taking it as prescribed and feels like she is doing "okay.".  She says that she notices it more when she is unable to do things she enjoys such as walking with her grandson because of her physical issues.  PHQ-9 today was 14.  Denies any SI/HI. -Continue Prozac as prescribed -We will follow-up at next visit -Help with pain management so she continue doing things she enjoys.  Bilateral lower extremity pain Patient reports chronic lower extremity pain which she associates with her fibromyalgia but says that over the last  5 days it has gotten worse.  She reports bilateral lower extremity edema which gets better with elevation and worsens while standing.  Elevation helps decrease the swelling as well as decreases the pain.  She has been taking Tylenol for the pain.  She takes furosemide 20 mg in the morning to help with fluid management and reports no changes in urination.  She has never used any form of compression stockings but is open to trying them.  She denies any erythema or warmth in her lower extremities. -Discussed warning signs of DVT, patient voiced understanding and reported that she would seek emergency medical treatment if  any of the signs presented themselves -Prescribed compression stockings to be worn daily -Refer to pain management for fibromyalgia optimization -Encouraged elevation of lower extremities as needed   Lab Orders  No laboratory test(s) ordered today    Meds ordered this encounter  Medications  . Elastic Bandages & Supports (MEDICAL COMPRESSION STOCKINGS) MISC    Sig: 1 Container by Does not apply route daily. Apply to both legs daily    Dispense:  2 each    Refill:  2    Gifford Shave, MD  PGY-1, [facility]  01/01/20 3:57 PM

## 2020-01-01 NOTE — Assessment & Plan Note (Signed)
Patient reports history of depression for which she takes Prozac 60 mg daily.  She reports she has been taking it as prescribed and feels like she is doing "okay.".  She says that she notices it more when she is unable to do things she enjoys such as walking with her grandson because of her physical issues.  PHQ-9 today was 14.  Denies any SI/HI. -Continue Prozac as prescribed -We will follow-up at next visit -Help with pain management so she continue doing things she enjoys.

## 2020-01-01 NOTE — Patient Instructions (Signed)
It was a pleasure seeing you today.  I am sorry you are having so much difficulty with your leg pain.  I would like you to continue taking your medications as prescribed.  I have also prescribed you some compression stockings which I would like for you to wear daily.  If you have any issues or needs refills please let me know.  I also put in referral for you to pain management.  They should contact you regarding an appointment.  I hope you get feeling better!   Edema  Edema is when you have too much fluid in your body or under your skin. Edema may make your legs, feet, and ankles swell up. Swelling is also common in looser tissues, like around your eyes. This is a common condition. It gets more common as you get older. There are many possible causes of edema. Eating too much salt (sodium) and being on your feet or sitting for a long time can cause edema in your legs, feet, and ankles. Hot weather may make edema worse. Edema is usually painless. Your skin may look swollen or shiny. Follow these instructions at home:  Keep the swollen body part raised (elevated) above the level of your heart when you are sitting or lying down.  Do not sit still or stand for a long time.  Do not wear tight clothes. Do not wear garters on your upper legs.  Exercise your legs. This can help the swelling go down.  Wear elastic bandages or support stockings as told by your doctor.  Eat a low-salt (low-sodium) diet to reduce fluid as told by your doctor.  Depending on the cause of your swelling, you may need to limit how much fluid you drink (fluid restriction).  Take over-the-counter and prescription medicines only as told by your doctor. Contact a doctor if:  Treatment is not working.  You have heart, liver, or kidney disease and have symptoms of edema.  You have sudden and unexplained weight gain. Get help right away if:  You have shortness of breath or chest pain.  You cannot breathe when you lie  down.  You have pain, redness, or warmth in the swollen areas.  You have heart, liver, or kidney disease and get edema all of a sudden.  You have a fever and your symptoms get worse all of a sudden. Summary  Edema is when you have too much fluid in your body or under your skin.  Edema may make your legs, feet, and ankles swell up. Swelling is also common in looser tissues, like around your eyes.  Raise (elevate) the swollen body part above the level of your heart when you are sitting or lying down.  Follow your doctor's instructions about diet and how much fluid you can drink (fluid restriction). This information is not intended to replace advice given to you by your health care provider. Make sure you discuss any questions you have with your health care provider. Document Revised: 12/03/2017 Document Reviewed: 12/18/2016 Elsevier Patient Education  2020 [employer].

## 2020-01-01 NOTE — Assessment & Plan Note (Signed)
>>  ASSESSMENT AND PLAN FOR BILATERAL LOWER EXTREMITY PAIN WRITTEN ON 01/01/2020  3:57 PM BY Derrel Nip, MD  Patient reports chronic lower extremity pain which she associates with her fibromyalgia but says that over the last 5 days it has gotten worse.  She reports bilateral lower extremity edema which gets better with elevation and worsens while standing.  Elevation helps decrease the swelling as well as decreases the pain.  She has been taking Tylenol for the pain.  She takes furosemide 20 mg in the morning to help with fluid management and reports no changes in urination.  She has never used any form of compression stockings but is open to trying them.  She denies any erythema or warmth in her lower extremities. -Discussed warning signs of DVT, patient voiced understanding and reported that she would seek emergency medical treatment if any of the signs presented themselves -Prescribed compression stockings to be worn daily -Refer to pain management for fibromyalgia optimization -Encouraged elevation of lower extremities as needed

## 2020-01-03 ENCOUNTER — Encounter: Payer: Self-pay | Admitting: Physical Medicine and Rehabilitation

## 2020-01-09 ENCOUNTER — Encounter: Payer: Self-pay | Admitting: Family Medicine

## 2020-01-09 DIAGNOSIS — E785 Hyperlipidemia, unspecified: Secondary | ICD-10-CM | POA: Insufficient documentation

## 2020-01-11 ENCOUNTER — Other Ambulatory Visit: Payer: Self-pay | Admitting: Family Medicine

## 2020-01-16 ENCOUNTER — Encounter
Payer: Medicare Other | Attending: Physical Medicine and Rehabilitation | Admitting: Physical Medicine and Rehabilitation

## 2020-01-26 ENCOUNTER — Other Ambulatory Visit: Payer: Self-pay | Admitting: Cardiology

## 2020-01-29 ENCOUNTER — Ambulatory Visit (INDEPENDENT_AMBULATORY_CARE_PROVIDER_SITE_OTHER): Payer: Medicare Other | Admitting: Family Medicine

## 2020-01-29 ENCOUNTER — Encounter: Payer: Self-pay | Admitting: Family Medicine

## 2020-01-29 ENCOUNTER — Other Ambulatory Visit: Payer: Self-pay

## 2020-01-29 VITALS — BP 138/88 | HR 108 | Wt 289.0 lb

## 2020-01-29 DIAGNOSIS — M79605 Pain in left leg: Secondary | ICD-10-CM | POA: Diagnosis not present

## 2020-01-29 DIAGNOSIS — F329 Major depressive disorder, single episode, unspecified: Secondary | ICD-10-CM | POA: Diagnosis not present

## 2020-01-29 DIAGNOSIS — F32A Depression, unspecified: Secondary | ICD-10-CM

## 2020-01-29 NOTE — Assessment & Plan Note (Signed)
Patient with his history of depression prescribed Prozac 60 mg daily which she reports good compliance with.  She reports that she is feeling better than she was on previous exam that she has been walking with her grandson more.  PHQ-9 today was 13 from 14 at last visit.  Reports that spending time with her grandson and doing things she enjoys help with her mood but that she is still saddened by the amount of pain that she has.  She has yet to follow-up with pain management because of issues with scheduling. -Continue Prozac as prescribed -We will follow-up at next visit -Appointment with pain management

## 2020-01-29 NOTE — Progress Notes (Signed)
   CHIEF COMPLAINT / HPI:  Left leg pain Patient reports that since seeing me last she started walking again.  Since she started walking she has noticed increasing left leg pain that starts in her buttock and goes down the back of her leg.  It is a dull pain that is tender to palpation and she feels like she can feel knots when she palpates.  She reports no pain at rest but says that after a block or 2 of walking she notices the pain increases with duration of exercise.  It is relieved by rest as well as with ice and heat.  Patient describes the pain as a dull pain and reports that it is not shooting or does not feel like electricity.  She does note that she has had issues with sciatica in the past.  She reports that this is a different pain than her right leg pain.  PERTINENT  PMH / PSH: Bilateral lower extremity pain, depression   OBJECTIVE: BP 138/88   Pulse (!) 108   Wt 289 lb (131.1 kg)   SpO2 98%   BMI 54.61 kg/m   General: Resting comfortably in chair when I enter the room. MSK: Patient with tenderness to palpation along posterior aspect of left thigh and into calf.  Patient with mild crepitus and left knee. Psych: Patient in good spirits when I enter the room.  Denies SI or HI.  Is intermittently tearful when discussing her chronic pain and physical limitations.  ASSESSMENT / PLAN:  Lower extremity pain, posterior, left Patient reports increasing pain in posterior aspect of left lower extremity which increases with exercise and improves with rest, ice, heat.  Patient recently started walking more again and this is related to increase in exercise.  Of note patient also has chronic issues with right lower extremity pain which she reports she sees an orthopedist for who recommended surgery.  Most likely related to conditioning. -Continuing to stretch before and after exercising -Follow-up with orthopedics and pain management regarding right lower extremity pain -Continue Tylenol  arthritic pain for relief -Monitor for worsening symptoms  Depression Patient with his history of depression prescribed Prozac 60 mg daily which she reports good compliance with.  She reports that she is feeling better than she was on previous exam that she has been walking with her grandson more.  PHQ-9 today was 13 from 14 at last visit.  Reports that spending time with her grandson and doing things she enjoys help with her mood but that she is still saddened by the amount of pain that she has.  She has yet to follow-up with pain management because of issues with scheduling. -Continue Prozac as prescribed -We will follow-up at next visit -Appointment with pain management       Gifford Shave, MD [facility]\

## 2020-01-29 NOTE — Assessment & Plan Note (Signed)
Patient reports increasing pain in posterior aspect of left lower extremity which increases with exercise and improves with rest, ice, heat.  Patient recently started walking more again and this is related to increase in exercise.  Of note patient also has chronic issues with right lower extremity pain which she reports she sees an orthopedist for who recommended surgery.  Most likely related to conditioning. -Continuing to stretch before and after exercising -Follow-up with orthopedics and pain management regarding right lower extremity pain -Continue Tylenol arthritic pain for relief -Monitor for worsening symptoms

## 2020-01-29 NOTE — Patient Instructions (Signed)
It was a pleasure to see you today!  I am sorry you are having no pain in the left leg.  I would like you to do this continuously or exercising.  I have this is most likely related to conditioning given it is dependent upon duration or distance you walk.  This should resolve over time with continuing to walk and exercise.  Be sure that you stretch before and after every walk.  Continue with the Tylenol arthritic pain and follow-up with your pain management regarding your right leg.  If the pain worsens or after few weeks of frank away please call back to schedule an appointment and we will adjust our plan as needed.  Regarding your mood I am happy that you are feeling better.  I want you to continue doing things that you enjoy like spending time with your grandson.  If you need anything please reach out.  I hope you have a wonderful rest of the day.

## 2020-02-08 ENCOUNTER — Other Ambulatory Visit: Payer: Self-pay | Admitting: Family Medicine

## 2020-02-17 ENCOUNTER — Ambulatory Visit: Payer: Medicare Other | Attending: Internal Medicine

## 2020-02-17 DIAGNOSIS — Z23 Encounter for immunization: Secondary | ICD-10-CM | POA: Insufficient documentation

## 2020-02-17 NOTE — Progress Notes (Signed)
   Covid-19 Vaccination Clinic  Name:  Kamaiyah Piccirilli    MRN: [id number] DOB: Jun 03, 1966  02/17/2020  Ms. Rose was observed post Covid-19 immunization for 15 minutes without incident. She was provided with Vaccine Information Sheet and instruction to access the V-Safe system.   Ms. Foerst was instructed to call 911 with any severe reactions post vaccine: Marland Kitchen Difficulty breathing  . Swelling of face and throat  . A fast heartbeat  . A bad rash all over body  . Dizziness and weakness   Immunizations Administered    Name Date Dose VIS Date Route   Pfizer COVID-19 Vaccine 02/17/2020 12:25 PM 0.3 mL 11/24/2019 Intramuscular   Manufacturer: [place]   Lot: [id number]   [place]: [id number]

## 2020-02-22 ENCOUNTER — Other Ambulatory Visit: Payer: Self-pay | Admitting: Cardiology

## 2020-03-04 ENCOUNTER — Other Ambulatory Visit: Payer: Self-pay | Admitting: Family Medicine

## 2020-03-05 ENCOUNTER — Ambulatory Visit (INDEPENDENT_AMBULATORY_CARE_PROVIDER_SITE_OTHER): Payer: Medicare Other | Admitting: Family Medicine

## 2020-03-05 ENCOUNTER — Other Ambulatory Visit: Payer: Self-pay

## 2020-03-05 VITALS — BP 130/80 | HR 105 | Ht 61.0 in | Wt 288.2 lb

## 2020-03-05 DIAGNOSIS — H8190 Unspecified disorder of vestibular function, unspecified ear: Secondary | ICD-10-CM

## 2020-03-05 DIAGNOSIS — R42 Dizziness and giddiness: Secondary | ICD-10-CM | POA: Diagnosis not present

## 2020-03-05 DIAGNOSIS — H8110 Benign paroxysmal vertigo, unspecified ear: Secondary | ICD-10-CM | POA: Diagnosis not present

## 2020-03-05 MED ORDER — MECLIZINE HCL 25 MG PO TABS: 25.0000 mg | ORAL_TABLET | Freq: Three times a day (TID) | ORAL | 0 refills | Status: DC | PRN

## 2020-03-05 NOTE — Progress Notes (Addendum)
    SUBJECTIVE:   CHIEF COMPLAINT / HPI:   Dizziness Feels like the room is moving and has been making her nauseous and vomit x 2 today.  Patient reports that dizziness was present upon waking up.  She reports this is happened once before and this is the feeling that she had when she was diagnosed with a TIA.  Patient also reports associated palpitations.  Denies fevers, chest pain, syncope, presyncope. Daughter brought her to appointment today.   PERTINENT  PMH / PSH: problem list showed MS, but patient reports the initial diagnosis, depression, elevated BMI, fibromyalgia   OBJECTIVE:   BP 130/80   Pulse (!) 105 Comment: provider informed  Ht 5\' 1"  (1.549 m)   Wt 288 lb 4 oz (130.7 kg)   SpO2 94%   BMI 54.46 kg/m   General: Well-appearing female, mild distress, sitting still in chair. HEENT: PERRLA.  EOMI.  Cranial nerves intact bilaterally. Chest: Regular rate and rhythm.  No M/R/G.  Normal S1 and S2. Lungs: CTA B.  No increased work of breathing or respiratory distress. MSK/neuro: Sensation intact in upper and lower extremities.  Patient has 5 out of 5 bilateral strength in upper and lower extremities.  No focal deficits.  Upon standing, patient holds onto exam table but is otherwise able to get up without issue.  She has normal gait. Patient declines performing Dix-Hallpike maneuver  ASSESSMENT/PLAN:   Acute vestibular syndrome Given sudden onset of vestibular symptoms, patient's history is most consistent with acute vestibular syndrome, which she was diagnosed with in September 2020.  Very unlikely to be a stroke given normal physical exam.  Patient previously misdiagnosed with multiple sclerosis.  Can also consider BPPV.  There is no associated hearing loss.  Patient declines to Dix-Hallpike maneuver today.  She reports that meclizine has helped with the nausea  vomiting.  Offered patient reassurance and specific return precautions.  She should follow-up if she experiences any  worsening symptoms.  Would like to follow-up patient closely given her risk factors and previous TIA.  Patient to schedule follow-up appointment early next week.  And continue diagnostic work-up if continued symptoms without improvement.     Wilber Oliphant, MD [place]

## 2020-03-05 NOTE — Patient Instructions (Addendum)
Dear Megan Dixon,   It was good to see you! Thank you for taking your time to come in to be seen. Today, we discussed the following:   Dizziness Please follow up on Monday if you are not feeling better.  Please go to the ED if you experience any weakness, slurred speech, severe dizziness that does not go away.  Future Appointments  Date Time Provider [place]  03/09/2020  1:45 PM [facility]  03/11/2020  1:30 PM ACCESS TO CARE POOL [facility]    Be well,   Zettie Cooley, M.D   [facility] [phone]  *Sign up for MyChart for instant access to your health profile, labs, orders, upcoming appointments or to contact your provider with questions*  ===================================================================================

## 2020-03-07 ENCOUNTER — Other Ambulatory Visit: Payer: Self-pay | Admitting: Family Medicine

## 2020-03-08 ENCOUNTER — Encounter: Payer: Self-pay | Admitting: Family Medicine

## 2020-03-08 NOTE — Assessment & Plan Note (Addendum)
Given sudden onset of vestibular symptoms, patient's history is most consistent with acute vestibular syndrome, which she was diagnosed with in September 2020.  Very unlikely to be a stroke given normal physical exam.  Patient previously misdiagnosed with multiple sclerosis.  Can also consider BPPV.  There is no associated hearing loss.  Patient declines to Dix-Hallpike maneuver today.  She reports that meclizine has helped with the nausea  vomiting.  Offered patient reassurance and specific return precautions.  She should follow-up if she experiences any worsening symptoms.  Would like to follow-up patient closely given her risk factors and previous TIA.  Patient to schedule follow-up appointment early next week.  And continue diagnostic work-up if continued symptoms without improvement.

## 2020-03-09 ENCOUNTER — Ambulatory Visit: Payer: Medicare Other | Attending: Internal Medicine

## 2020-03-09 DIAGNOSIS — Z23 Encounter for immunization: Secondary | ICD-10-CM

## 2020-03-09 NOTE — Progress Notes (Signed)
   Covid-19 Vaccination Clinic  Name:  Megan Dixon    MRN: [id number] DOB: 1966-02-06  03/09/2020  Megan Dixon was observed post Covid-19 immunization for 15 minutes without incident. She was provided with Vaccine Information Sheet and instruction to access the V-Safe system.   Megan Dixon was instructed to call 911 with any severe reactions post vaccine: Marland Kitchen Difficulty breathing  . Swelling of face and throat  . A fast heartbeat  . A bad rash all over body  . Dizziness and weakness   Immunizations Administered    Name Date Dose VIS Date Route   Pfizer COVID-19 Vaccine 03/09/2020 12:34 PM 0.3 mL 11/24/2019 Intramuscular   Manufacturer: [place]   Lot: [id number]   [place]: [id number]

## 2020-03-11 ENCOUNTER — Ambulatory Visit: Payer: Medicare Other

## 2020-03-11 ENCOUNTER — Other Ambulatory Visit: Payer: Self-pay

## 2020-03-14 ENCOUNTER — Telehealth: Payer: Self-pay | Admitting: *Deleted

## 2020-03-14 NOTE — Telephone Encounter (Signed)
Received fax requesting Dihydroergotamine nasal spray, did not see on current med list. Placed in your box for reference.Drevin Ortner Zimmerman Rumple, CMA

## 2020-03-18 NOTE — Telephone Encounter (Signed)
I attempted to call the patient regarding prescription for dihydroergotamine nasal spray.  This is a medication that I have not prescribed for her in the past and she must of gotten from her previous PCP.  I want to verify that she actually needs it and is not just an automated refill request.  Patient did not answer her phone.  I left a message and will reattempt to call her at a later date.

## 2020-03-20 ENCOUNTER — Ambulatory Visit (INDEPENDENT_AMBULATORY_CARE_PROVIDER_SITE_OTHER): Payer: Medicare Other | Admitting: Family Medicine

## 2020-03-20 ENCOUNTER — Encounter: Payer: Self-pay | Admitting: Family Medicine

## 2020-03-20 ENCOUNTER — Other Ambulatory Visit: Payer: Self-pay

## 2020-03-20 VITALS — BP 124/78 | HR 101 | Ht 61.0 in | Wt 292.8 lb

## 2020-03-20 DIAGNOSIS — M79605 Pain in left leg: Secondary | ICD-10-CM | POA: Diagnosis not present

## 2020-03-20 NOTE — Assessment & Plan Note (Signed)
Patient is concerned regarding her weight gain.  She reports she has never been in the 290s and is trying to diet and lose weight but is unsuccessful.  Is requesting nutrition consult for any help or guidance regarding weight loss.  Needs to lose weight so that she can have her right knee operation. -Discussed healthy diet habits -Placed referral for nutritionist -We will monitor weights office visit

## 2020-03-20 NOTE — Progress Notes (Signed)
    SUBJECTIVE:   CHIEF COMPLAINT / HPI:  Dizziness Patient is here for follow-up visit after she had dizziness at her previous visit.  It was thought to be acute vestibular syndrome or BPPV.  She was given meclizine which she reports completely helped with the symptoms.  She denies any dizziness issues since her last visit.  Low back pain and left lower extremity pain Patient reports she is still having left lower extremity pain that starts in her lumbar region and travels down the back of her left thigh and then moves to the side.  The pain is a dull pain but is tender to palpation.  She denies any pain at rest.  Reports pain when she elevates her leg and decreased pain when she is lying on her side with a pillow between her legs.  Patient reports she has had sciatica in the past but feels like this is not sciatica.  Reports that she has tried "everything" with little to no relief.  Weight gain Patient has concerns over weight gain.  Most recent weight was 292 pounds up from 288 pounds at last visit.  Patient reports she has tried to make dietary changes to lose weight but has been unsuccessful.  She also exercises with her grandson but feels like this is not helping with the weight either.  Reports she eats no fried foods and bakes all her proteins.  Denies any sodas and says she drinks only water.  Depression Patient reports she is in good mood and feels good.  Her PHQ 9 today was 0 down from 13 at last visit.  PERTINENT  PMH / PSH: Fibromyalgia, chronic pain  OBJECTIVE:   BP 124/78   Pulse (!) 101   Ht 5\' 1"  (1.549 m)   Wt 292 lb 12.8 oz (132.8 kg)   SpO2 96%   BMI 55.32 kg/m   General: Obese, well-appearing, no acute distress Cardio: Regular rate and rhythm, no murmurs noted Respiratory: Normal work of breathing, clear to auscultation bilaterally MSK: Patient continues to have left-sided paraspinal tenderness along with tenderness to palpation along posterior aspect of left  thigh. Psych: Patient is in good spirits but concerned about her left lower extremity pain PHQ-9 today was 0 from 13 at last visit  [place] 03/20/2020 03/05/2020 01/29/2020  Score 0 0 13    ASSESSMENT/PLAN:   Lower extremity pain, posterior, left Patient has continued pain in her left lower extremity starting in the left sided paralumbar region and traveling down the posterior lateral aspect of her thigh.  Patient has been unable to see orthopedist for pain management last visit -Continue stretching before and after exercising -Please follow-up with orthopedics and pain management -Continue Tylenol arthritic pain for relief -Lumbar MRI ordered given duration of this pain -We will monitor for worsening symptoms  Morbid obesity ([place]) Patient is concerned regarding her weight gain.  She reports she has never been in the 290s and is trying to diet and lose weight but is unsuccessful.  Is requesting nutrition consult for any help or guidance regarding weight loss.  Needs to lose weight so that she can have her right knee operation. -Discussed healthy diet habits -Placed referral for nutritionist -We will monitor weights office visit   Gifford Shave, MD [place]

## 2020-03-20 NOTE — Patient Instructions (Signed)
It was a pleasure seeing you today.  I am glad that your vertigo symptoms have seemed to resolve.  Regarding her low back pain and left lower extremity pain I have put an order in for an MRI.  I would like you to continue the pain medications that you are taking at this time and please follow-up with your pain management specialist along with your orthopedist when possible.  Regarding your weight gain concerns I have put a referral in for a nutritionist from our office to call you and schedule an initial patient visit.  Someone from our office should be contacting you regarding that.  If you have any questions or concerns please call the clinic we can schedule another appointment.   Calorie Counting for Weight Loss Calories are units of energy. Your body needs a certain amount of calories from food to keep you going throughout the day. When you eat more calories than your body needs, your body stores the extra calories as fat. When you eat fewer calories than your body needs, your body burns fat to get the energy it needs. Calorie counting means keeping track of how many calories you eat and drink each day. Calorie counting can be helpful if you need to lose weight. If you make sure to eat fewer calories than your body needs, you should lose weight. Ask your health care provider what a healthy weight is for you. For calorie counting to work, you will need to eat the right number of calories in a day in order to lose a healthy amount of weight per week. A dietitian can help you determine how many calories you need in a day and will give you suggestions on how to reach your calorie goal.  A healthy amount of weight to lose per week is usually 1-2 lb (0.5-0.9 kg). This usually means that your daily calorie intake should be reduced by 500-750 calories.  Eating 1,200 - 1,500 calories per day can help most women lose weight.  Eating 1,500 - 1,800 calories per day can help most men lose weight.  What is my  plan? My goal is to have __________ calories per day. If I have this many calories per day, I should lose around __________ pounds per week. What do I need to know about calorie counting? In order to meet your daily calorie goal, you will need to:  Find out how many calories are in each food you would like to eat. Try to do this before you eat.  Decide how much of the food you plan to eat.  Write down what you ate and how many calories it had. Doing this is called keeping a food log. To successfully lose weight, it is important to balance calorie counting with a healthy lifestyle that includes regular activity. Aim for 150 minutes of moderate exercise (such as walking) or 75 minutes of vigorous exercise (such as running) each week. Where do I find calorie information?  The number of calories in a food can be found on a Nutrition Facts label. If a food does not have a Nutrition Facts label, try to look up the calories online or ask your dietitian for help. Remember that calories are listed per serving. If you choose to have more than one serving of a food, you will have to multiply the calories per serving by the amount of servings you plan to eat. For example, the label on a package of bread might say that a serving size is  1 slice and that there are 90 calories in a serving. If you eat 1 slice, you will have eaten 90 calories. If you eat 2 slices, you will have eaten 180 calories. How do I keep a food log? Immediately after each meal, record the following information in your food log:  What you ate. Don't forget to include toppings, sauces, and other extras on the food.  How much you ate. This can be measured in cups, ounces, or number of items.  How many calories each food and drink had.  The total number of calories in the meal. Keep your food log near you, such as in a small notebook in your pocket, or use a mobile app or website. Some programs will calculate calories for you and show  you how many calories you have left for the day to meet your goal. What are some calorie counting tips?   Use your calories on foods and drinks that will fill you up and not leave you hungry: ? Some examples of foods that fill you up are nuts and nut butters, vegetables, lean proteins, and high-fiber foods like whole grains. High-fiber foods are foods with more than 5 g fiber per serving. ? Drinks such as sodas, specialty coffee drinks, alcohol, and juices have a lot of calories, yet do not fill you up.  Eat nutritious foods and avoid empty calories. Empty calories are calories you get from foods or beverages that do not have many vitamins or protein, such as candy, sweets, and soda. It is better to have a nutritious high-calorie food (such as an avocado) than a food with few nutrients (such as a bag of chips).  Know how many calories are in the foods you eat most often. This will help you calculate calorie counts faster.  Pay attention to calories in drinks. Low-calorie drinks include water and unsweetened drinks.  Pay attention to nutrition labels for "low fat" or "fat free" foods. These foods sometimes have the same amount of calories or more calories than the full fat versions. They also often have added sugar, starch, or salt, to make up for flavor that was removed with the fat.  Find a way of tracking calories that works for you. Get creative. Try different apps or programs if writing down calories does not work for you. What are some portion control tips?  Know how many calories are in a serving. This will help you know how many servings of a certain food you can have.  Use a measuring cup to measure serving sizes. You could also try weighing out portions on a kitchen scale. With time, you will be able to estimate serving sizes for some foods.  Take some time to put servings of different foods on your favorite plates, bowls, and cups so you know what a serving looks like.  Try not to  eat straight from a bag or box. Doing this can lead to overeating. Put the amount you would like to eat in a cup or on a plate to make sure you are eating the right portion.  Use smaller plates, glasses, and bowls to prevent overeating.  Try not to multitask (for example, watch TV or use your computer) while eating. If it is time to eat, sit down at a table and enjoy your food. This will help you to know when you are full. It will also help you to be aware of what you are eating and how much you are eating. What are  tips for following this plan? Reading food labels  Check the calorie count compared to the serving size. The serving size may be smaller than what you are used to eating.  Check the source of the calories. Make sure the food you are eating is high in vitamins and protein and low in saturated and trans fats. Shopping  Read nutrition labels while you shop. This will help you make healthy decisions before you decide to purchase your food.  Make a grocery list and stick to it. Cooking  Try to cook your favorite foods in a healthier way. For example, try baking instead of frying.  Use low-fat dairy products. Meal planning  Use more fruits and vegetables. Half of your plate should be fruits and vegetables.  Include lean proteins like poultry and fish. How do I count calories when eating out?  Ask for smaller portion sizes.  Consider sharing an entree and sides instead of getting your own entree.  If you get your own entree, eat only half. Ask for a box at the beginning of your meal and put the rest of your entree in it so you are not tempted to eat it.  If calories are listed on the menu, choose the lower calorie options.  Choose dishes that include vegetables, fruits, whole grains, low-fat dairy products, and lean protein.  Choose items that are boiled, broiled, grilled, or steamed. Stay away from items that are buttered, battered, fried, or served with cream sauce. Items  labeled "crispy" are usually fried, unless stated otherwise.  Choose water, low-fat milk, unsweetened iced tea, or other drinks without added sugar. If you want an alcoholic beverage, choose a lower calorie option such as a glass of wine or light beer.  Ask for dressings, sauces, and syrups on the side. These are usually high in calories, so you should limit the amount you eat.  If you want a salad, choose a garden salad and ask for grilled meats. Avoid extra toppings like bacon, cheese, or fried items. Ask for the dressing on the side, or ask for olive oil and vinegar or lemon to use as dressing.  Estimate how many servings of a food you are given. For example, a serving of cooked rice is  cup or about the size of half a baseball. Knowing serving sizes will help you be aware of how much food you are eating at restaurants. The list below tells you how big or small some common portion sizes are based on everyday objects: ? 1 oz--4 stacked dice. ? 3 oz--1 deck of cards. ? 1 tsp--1 die. ? 1 Tbsp-- a ping-pong ball. ? 2 Tbsp--1 ping-pong ball. ?  cup-- baseball. ? 1 cup--1 baseball. Summary  Calorie counting means keeping track of how many calories you eat and drink each day. If you eat fewer calories than your body needs, you should lose weight.  A healthy amount of weight to lose per week is usually 1-2 lb (0.5-0.9 kg). This usually means reducing your daily calorie intake by 500-750 calories.  The number of calories in a food can be found on a Nutrition Facts label. If a food does not have a Nutrition Facts label, try to look up the calories online or ask your dietitian for help.  Use your calories on foods and drinks that will fill you up, and not on foods and drinks that will leave you hungry.  Use smaller plates, glasses, and bowls to prevent overeating. This information is not intended to  replace advice given to you by your health care provider. Make sure you discuss any questions  you have with your health care provider. Document Revised: 08/19/2018 Document Reviewed: 10/30/2016 Elsevier Patient Education  [place].

## 2020-03-20 NOTE — Assessment & Plan Note (Signed)
Patient has continued pain in her left lower extremity starting in the left sided paralumbar region and traveling down the posterior lateral aspect of her thigh.  Patient has been unable to see orthopedist for pain management last visit -Continue stretching before and after exercising -Please follow-up with orthopedics and pain management -Continue Tylenol arthritic pain for relief -Lumbar MRI ordered given duration of this pain -We will monitor for worsening symptoms

## 2020-03-21 ENCOUNTER — Other Ambulatory Visit: Payer: Self-pay | Admitting: Cardiology

## 2020-03-27 ENCOUNTER — Other Ambulatory Visit: Payer: Self-pay | Admitting: Family Medicine

## 2020-04-01 ENCOUNTER — Ambulatory Visit (HOSPITAL_COMMUNITY): Admission: RE | Admit: 2020-04-01 | Payer: Medicare Other | Source: Ambulatory Visit

## 2020-04-19 ENCOUNTER — Other Ambulatory Visit: Payer: Self-pay | Admitting: Cardiology

## 2020-04-20 ENCOUNTER — Other Ambulatory Visit: Payer: Self-pay | Admitting: Family Medicine

## 2020-05-05 ENCOUNTER — Other Ambulatory Visit: Payer: Self-pay | Admitting: Family Medicine

## 2020-05-22 ENCOUNTER — Other Ambulatory Visit: Payer: Self-pay | Admitting: Cardiology

## 2020-05-22 NOTE — Telephone Encounter (Signed)
Rx request sent to pharmacy.  

## 2020-06-07 ENCOUNTER — Other Ambulatory Visit: Payer: Self-pay | Admitting: Family Medicine

## 2020-06-20 ENCOUNTER — Other Ambulatory Visit: Payer: Self-pay | Admitting: Family Medicine

## 2020-07-11 ENCOUNTER — Other Ambulatory Visit: Payer: Self-pay

## 2020-07-11 ENCOUNTER — Telehealth: Payer: Self-pay | Admitting: Family Medicine

## 2020-07-11 ENCOUNTER — Ambulatory Visit: Payer: Medicare Other | Admitting: Family Medicine

## 2020-07-11 DIAGNOSIS — M545 Low back pain, unspecified: Secondary | ICD-10-CM

## 2020-07-11 DIAGNOSIS — G8929 Other chronic pain: Secondary | ICD-10-CM

## 2020-07-11 MED ORDER — GABAPENTIN 600 MG PO TABS: 600.0000 mg | ORAL_TABLET | Freq: Three times a day (TID) | ORAL | 2 refills | Status: DC

## 2020-07-11 NOTE — Telephone Encounter (Signed)
Called patient regarding MRI scheduling.  She does not know why she missed her scheduled MRI but would like to have it done.  Routing comment team to schedule MRI.

## 2020-07-11 NOTE — Patient Instructions (Signed)
  Diet Recommendations   Carbohydrate includes starch, sugar, and fiber.  Of these, only sugar and starch raise blood glucose.  (Fiber is found in fruits, vegetables [especially skin, seeds, and stalks] and whole grains.)   Starchy (carb) foods: Bread, rice, pasta, potatoes, corn, cereal, grits, crackers, bagels, muffins, all baked goods.  (Fruit, milk, and yogurt also have carbohydrate, but most of these foods will not spike your blood sugar as most starchy foods will.)  A few fruits do cause high blood sugars; use small portions of bananas (limit to 1/2 at a time), grapes, watermelon, oranges, and most tropical fruits.   Protein foods: Meat, fish, poultry, eggs, dairy foods, and beans such as pinto and kidney beans (beans also provide carbohydrate).   1. Eat at least REAL 3 meals and 1-2 snacks per day. Never go more than 4-5 hours while awake without eating. Eat breakfast within the first hour of getting up.   2. Limit starchy foods to ONE per meal or snack. ONE portion of a starchy food is equal to the following:   - ONE slice of bread (or its equivalent, such as half of a hamburger bun).   - 1/2 cup of a "scoopable" starchy food such as potatoes or rice.   - 15 grams of Total Carbohydrate as shown on food label.   - Every 4 ounces of a sweet drink (including fruit juice). 3. Obtain twice the volume of veg's as protein or carbohydrate foods for both lunch and dinner  - Fresh or frozen veg's are best.   - Keep frozen veg's on hand for a quick vegetable serving.     Follow

## 2020-07-11 NOTE — Progress Notes (Signed)
Nutr Encounter I met with Megan Dixon (MRN [id number]) on 07/11/2020 in the [facility] clinic, along with Iver Nestle, PhD, RD, LDN.    Appt start time: 1030 end time: 1110 (40 min)  Reason for visit: Prediabetes and obesity   Relevant history/background: Morbid Obesity, prediabetes, HTN, HLD   Assessment:  Usual eating pattern: 2 meals and 2 snacks per day. Frequent foods and beverages: Bread, water or kool aid  .   Usual physical activity: walks 1.5 hours 6 days a week. Sleep: Patient estimates average of 7 hours of sleep/night 1 hour during day.  24-hr recall:  (Up at 5  AM) B ( 9 AM)- 2 slices bread, 1 banana, 1 C pineapple    Snk ( AM)-   L ( 12:30 PM)-  Salad, 1 tbsp dressing, water  Snk ( PM)-   D ( 6 PM)-  4 oz baked fish, baked potato, 1 tbsp, water  Snk (9 PM)-  Rice crispy treat  Typical day? Yes.   does usisally have coffee with creamer   Intervention: Completed diet and exercise history, and established: - SMART behavioral goals (Smart Goals: Specific, Measurable, Action-oriented, Realistic, Time-based.)  - Method of documenting progress.    For recommendations and goals, see Patient Instructions.    Follow-up: Scheduled for 8/24 at 2:45   Gifford Shave

## 2020-07-25 ENCOUNTER — Telehealth: Payer: Self-pay | Admitting: *Deleted

## 2020-07-25 NOTE — Telephone Encounter (Signed)
Contacted pt to inform her of her MRI appt time.  It is on  Friday 08/09/2020 at [facility] 1st floor radiology and she is to arrive at 10:30am for an 11:00am appointment.April Zimmerman Rumple, CMA

## 2020-07-30 ENCOUNTER — Telehealth: Payer: Self-pay | Admitting: Family Medicine

## 2020-07-30 ENCOUNTER — Other Ambulatory Visit: Payer: Self-pay | Admitting: Family Medicine

## 2020-07-30 NOTE — Telephone Encounter (Signed)
Called patient regarding her picking up prescription that was accidentally sent twice.  She reports that she did get 2 bottles from 2 separate pharmacies for her cyclobenzaprine.  I recommended that she only take them as needed and that she would not be getting a prescription for good while because she has enough medication to last her for approximately 6 months.  She understands this.  During the conversation patient mentioned her lower extremity pain is worsening and that she is having swelling again similar to her previous swelling.  She also reports increased shortness of breath over the last week.  She is taking all of her medications and prescribed and reports that she is urinating as normal.  I discussed signs and symptoms of pulmonary embolisms as well as congestive heart failure exacerbations.  I tell her that I am concerned and would like her to be evaluated in the emergency department.  Patient says that she is unable to go to the emergency department at this time because she is babysitting her grandson.  She says that she has an appointment scheduled with me next week and she will wait for that appointment because she does not wish to see anyone else in our clinic.  I stressed the importance of her being evaluated in the emergency room and she declines at this time but says that she will go in the morning when someone is there to pick up her grandson.  I once again stressed that she should be evaluated right now and to please go to the emergency department but the patient still reported she would be going in the morning.  I gave her strict descriptive signs including chest pain, worsening shortness of breath, worsening swelling in her legs that she needs to call EMS.  Patient voices her understanding and says that she will call the clinic when she is on her way to the hospital tomorrow to let us know that she is going.

## 2020-07-31 NOTE — Telephone Encounter (Addendum)
Called patient regarding her going to be evaluated for her shortness of breath and leg pain.  She reports that she is still having leg pain but her shortness of breath is improved.  She does not want to go to the hospital and wants to wait for our visit on the 24th.  I discussed with her the importance of being evaluated out of concern for shortness of breath and leg pain but patient reports that this is her chronic leg pain and she really just wants to wait for her MRI as well.  I offered to try to schedule her for a sooner appointment with our clinic but she would like to wait to see me.  Strict precautions given regarding symptoms in which she needs to call 911 immediately for including chest pain, worsening shortness of breath, redness erythema or warmth in her leg.  Patient voices understanding.

## 2020-08-06 ENCOUNTER — Ambulatory Visit (INDEPENDENT_AMBULATORY_CARE_PROVIDER_SITE_OTHER): Payer: Medicare Other | Admitting: Family Medicine

## 2020-08-06 ENCOUNTER — Other Ambulatory Visit: Payer: Self-pay

## 2020-08-06 ENCOUNTER — Encounter: Payer: Self-pay | Admitting: Family Medicine

## 2020-08-06 VITALS — BP 132/80 | HR 106 | Ht 61.0 in | Wt 293.6 lb

## 2020-08-06 DIAGNOSIS — I10 Essential (primary) hypertension: Secondary | ICD-10-CM | POA: Diagnosis not present

## 2020-08-06 DIAGNOSIS — I509 Heart failure, unspecified: Secondary | ICD-10-CM

## 2020-08-06 DIAGNOSIS — R600 Localized edema: Secondary | ICD-10-CM

## 2020-08-06 NOTE — Patient Instructions (Signed)
It was a pleasure to see you today.  I am sorry you are still having issues with your back but you had the MRI scheduled for Friday and we will hopefully have some answers after that.  Regarding your lower extremity edema I would like to get an ultrasound of your right leg to make sure you do not have a clot there.  I am also going to get an echocardiogram to ensure your heart is doing okay and I am going to send referral in for cardiology.  I am also collecting some lab work today to assess your kidney function and electrolytes as well as your hemoglobin levels.  If you have any chest pain, shortness of breath, chest tightness please seek medical attention immediately.  Regarding your dietary changes please work on eating at least 3 meals a day and limiting your starches which it sounds like you are doing a good job of.  I hope you have a wonderful afternoon!

## 2020-08-06 NOTE — Progress Notes (Signed)
    SUBJECTIVE:   CHIEF COMPLAINT / HPI:   Back pain  Patient is having continued back pain which she reports radiates down her legs bilaterally.  MRI has been ordered and patient is waiting for that to be completed on Friday.  No changes from previous evaluation and we are awaiting MRI results to determine the next steps in treatment.  Nutrition  Patient reports that she has been working on her nutritional recommendations that we discussed in nutrition visit.  She has decreased the amount of carbs she is eating.  She is still having issues eating at least 3 meals a day +2 snacks.  She reports yesterday she had breakfast and a snack but skipped lunch and skip the afternoon snack.  She then ate dinner.  I discussed the importance of making sure she works to have at least 3 meals a day and 2 snacks, limiting her carbs.  She is concerned that the weight is from fluid.  Lower extremity swelling  Patient has history of chronic lower extremity swelling.  She reports that the right leg has gotten worse over the last week or so and is hurting terribly.  She is on Lasix 20 mg daily which she was prescribed from her previous provider.  She thinks that this dose may not be high enough to get fluid off.  We discussed treatments for lower extremity edema if it is related to chronic venous stasis.  She has not tried compression stockings but has tried elevation and reports mild improvement but it immediately returns.  Denies any worsening shortness of breath or chest pain.   OBJECTIVE:   BP 132/80   Pulse (!) 106   Ht 5\' 1"  (1.549 m)   Wt 293 lb 9.6 oz (133.2 kg)   SpO2 97%   BMI 55.48 kg/m   General: Well-appearing, obese, intermittently appears in pain Cardiac: Regular rate and rhythm, no murmurs appreciated Respiratory: Lungs clear to auscultation bilaterally, normal breathing, no wheezes appreciated Abdomen: Soft, nontender, obese, positive bowel sounds MSK: Patient has tenderness in lower back  which she reports radiate down both legs, denies any weakness.  Patient also has edema in right lower extremity greater than left lower extremity.  No erythema, point tenderness, warmth.  ASSESSMENT/PLAN:   Lower extremity edema Patient with history of her lower extremity swelling.  Today it is noted worse on the right lower extremity than left lower extremity.  Patient is also reporting increased pain in the right lower extremity.  No erythema edema, edema.  Diffuse tenderness along right lower extremity. -Given patient has been more sedentary due to back pain being increased unilateral swelling DVT ultrasound ordered -Strict return precautions given regarding chest pain, shortness of breath erythema, point tenderness.  And patient instructed to seek medical attention immediately if having symptoms.  Congestive heart failure [facility]) Patient reports history of congestive heart failure diagnosed by her previous providers.  She has not seen a cardiologist since moving to the area. -Given increased lower extremity swelling echocardiogram ordered -Ambulatory referral to cardiology placed -Follow-up within the next month to assess status and discuss results  Hypertension Patient with past medical history of hypertension.  Current medications include Coreg, Cozaar, Lasix.  Patient reports good compliance with these medications.  Blood pressure mildly elevated today at 132/80. -CMP normal -CBC    Gifford Shave, MD [place]

## 2020-08-07 ENCOUNTER — Telehealth: Payer: Self-pay | Admitting: Family Medicine

## 2020-08-07 LAB — CBC
Hematocrit: 44.7 % (ref 34.0–46.6)
Hemoglobin: 13.8 g/dL (ref 11.1–15.9)
MCH: 27 pg (ref 26.6–33.0)
MCHC: 30.9 g/dL — ABNORMAL LOW (ref 31.5–35.7)
MCV: 88 fL (ref 79–97)
Platelets: 271 10*3/uL (ref 150–450)
RBC: 5.11 x10E6/uL (ref 3.77–5.28)
RDW: 14.4 % (ref 11.7–15.4)
WBC: 11.9 10*3/uL — ABNORMAL HIGH (ref 3.4–10.8)

## 2020-08-07 LAB — COMPREHENSIVE METABOLIC PANEL
ALT: 12 IU/L (ref 0–32)
AST: 14 IU/L (ref 0–40)
Albumin/Globulin Ratio: 1.3 (ref 1.2–2.2)
Albumin: 4.2 g/dL (ref 3.8–4.9)
Alkaline Phosphatase: 135 IU/L — ABNORMAL HIGH (ref 48–121)
BUN/Creatinine Ratio: 17 (ref 9–23)
BUN: 12 mg/dL (ref 6–24)
Bilirubin Total: 0.3 mg/dL (ref 0.0–1.2)
CO2: 26 mmol/L (ref 20–29)
Calcium: 9.3 mg/dL (ref 8.7–10.2)
Chloride: 100 mmol/L (ref 96–106)
Creatinine, Ser: 0.7 mg/dL (ref 0.57–1.00)
GFR calc Af Amer: 112 mL/min/{1.73_m2} (ref >59)
GFR calc non Af Amer: 97 mL/min/{1.73_m2} (ref >59)
Globulin, Total: 3.2 g/dL (ref 1.5–4.5)
Glucose: 104 mg/dL — ABNORMAL HIGH (ref 65–99)
Potassium: 3.9 mmol/L (ref 3.5–5.2)
Sodium: 140 mmol/L (ref 134–144)
Total Protein: 7.4 g/dL (ref 6.0–8.5)

## 2020-08-07 NOTE — Telephone Encounter (Signed)
I called the patient and discussed her lab results with her.  She is scheduled for ultrasound of right lower extremity tomorrow morning and MRI either tomorrow or Friday.  She understands her test results.  Her white count was mildly elevated and I discussed infectious symptoms.  She reports she has had a mild cough but no fever or other infectious symptoms.  We will continue to monitor for those but no treatment indicated at this time.

## 2020-08-08 DIAGNOSIS — I509 Heart failure, unspecified: Secondary | ICD-10-CM | POA: Insufficient documentation

## 2020-08-08 DIAGNOSIS — R600 Localized edema: Secondary | ICD-10-CM | POA: Insufficient documentation

## 2020-08-08 NOTE — Assessment & Plan Note (Signed)
Patient with history of her lower extremity swelling.  Today it is noted worse on the right lower extremity than left lower extremity.  Patient is also reporting increased pain in the right lower extremity.  No erythema edema, edema.  Diffuse tenderness along right lower extremity. -Given patient has been more sedentary due to back pain being increased unilateral swelling DVT ultrasound ordered -Strict return precautions given regarding chest pain, shortness of breath erythema, point tenderness.  And patient instructed to seek medical attention immediately if having symptoms.

## 2020-08-08 NOTE — Assessment & Plan Note (Signed)
Patient reports history of congestive heart failure diagnosed by her previous providers.  She has not seen a cardiologist since moving to the area. -Given increased lower extremity swelling echocardiogram ordered -Ambulatory referral to cardiology placed -Follow-up within the next month to assess status and discuss results

## 2020-08-08 NOTE — Assessment & Plan Note (Signed)
Patient with past medical history of hypertension.  Current medications include Coreg, Cozaar, Lasix.  Patient reports good compliance with these medications.  Blood pressure mildly elevated today at 132/80. -CMP normal -CBC

## 2020-08-09 ENCOUNTER — Ambulatory Visit (HOSPITAL_BASED_OUTPATIENT_CLINIC_OR_DEPARTMENT_OTHER)
Admission: RE | Admit: 2020-08-09 | Discharge: 2020-08-09 | Disposition: A | Discharge status: Home / Self Care | Payer: Medicare Other | Source: Ambulatory Visit | Attending: Family Medicine | Admitting: Family Medicine

## 2020-08-09 ENCOUNTER — Ambulatory Visit (HOSPITAL_COMMUNITY)
Admission: RE | Admit: 2020-08-09 | Discharge: 2020-08-09 | Disposition: A | Discharge status: Home / Self Care | Payer: Medicare Other | Source: Ambulatory Visit | Attending: Family Medicine | Admitting: Family Medicine

## 2020-08-09 ENCOUNTER — Other Ambulatory Visit: Payer: Self-pay

## 2020-08-09 DIAGNOSIS — M545 Low back pain, unspecified: Secondary | ICD-10-CM

## 2020-08-09 DIAGNOSIS — G8929 Other chronic pain: Secondary | ICD-10-CM | POA: Diagnosis not present

## 2020-08-09 DIAGNOSIS — R600 Localized edema: Secondary | ICD-10-CM

## 2020-08-09 DIAGNOSIS — I509 Heart failure, unspecified: Secondary | ICD-10-CM | POA: Insufficient documentation

## 2020-08-09 LAB — ECHOCARDIOGRAM COMPLETE
Area-P 1/2: 4.74 cm2
Calc EF: 53.7 %
S' Lateral: 3.9 cm
Single Plane A2C EF: 52.1 %
Single Plane A4C EF: 54.5 %

## 2020-08-09 NOTE — Progress Notes (Signed)
Right lower ext. study completed.   See CVProc for preliminary results.   Griffin Basil, RDMS, RVT

## 2020-08-09 NOTE — Progress Notes (Signed)
  Echocardiogram 2D Echocardiogram has been performed.  Geoffery Lyons Swaim 08/09/2020, 2:30 PM

## 2020-08-27 ENCOUNTER — Other Ambulatory Visit: Payer: Self-pay

## 2020-08-27 ENCOUNTER — Ambulatory Visit (INDEPENDENT_AMBULATORY_CARE_PROVIDER_SITE_OTHER): Payer: Medicare Other | Admitting: Family Medicine

## 2020-08-27 ENCOUNTER — Encounter: Payer: Self-pay | Admitting: Family Medicine

## 2020-08-27 DIAGNOSIS — M797 Fibromyalgia: Secondary | ICD-10-CM | POA: Diagnosis not present

## 2020-08-27 MED ORDER — DULOXETINE HCL 30 MG PO CPEP: 30.0000 mg | ORAL_CAPSULE | Freq: Every day | ORAL | 3 refills | Status: DC

## 2020-08-27 NOTE — Patient Instructions (Signed)
It was wonderful to see you today and I am sorry you are having so many issues with pain.  I have given you the number for the pain management specialist, please call them and schedule an appointment.  Also please be sure to go to your cardiology appointment on 23 of September.  From our and I would like to change your medication from fluoxetine (Prozac) to Cymbalta.  We will need to titrate you off of the fluoxetine and then start you on Cymbalta.  I want you to take 2 tablets (40 mg) from 9/15-9/18.  He will then take 1 tablet (20 mg) from 9/19-9/22.  You will then take no fluoxetine or Cymbalta from 9/23-9/26.  You will start taking the Cymbalta on 9/27 and will take 1 pill (30 mg) daily for 1 week and on 10/4 you will increase it to 2 pills (60 mg) daily.  If you have any questions or concerns please feel free to call the clinic and they will route the message to me.  I happy have a wonderful day and I would like to see you in approximately 1 month.  Duloxetine delayed-release capsules What is this medicine? DULOXETINE (doo LOX e teen) is used to treat depression, anxiety, and different types of chronic pain. This medicine may be used for other purposes; ask your health care provider or pharmacist if you have questions. COMMON BRAND NAME(S): Cymbalta, Creig Hines, [place]ren[place] What should I tell my health care provider before I take this medicine? They need to know if you have any of these conditions:  bipolar disorder  glaucoma  high blood pressure  kidney disease  liver disease  seizures  suicidal thoughts, plans or attempt; a previous suicide attempt by you or a family member  take medicines that treat or prevent blood clots  taken medicines called MAOIs like Carbex, Eldepryl, Marplan, Nardil, and Parnate within 14 days  trouble passing urine  an unusual reaction to duloxetine, other medicines, foods, dyes, or preservatives  pregnant or trying to get pregnant  breast-feeding How  should I use this medicine? Take this medicine by mouth with a glass of water. Follow the directions on the prescription label. Do not crush, cut or chew some capsules of this medicine. Some capsules may be opened and sprinkled on applesauce. Check with your doctor or pharmacist if you are not sure. You can take this medicine with or without food. Take your medicine at regular intervals. Do not take your medicine more often than directed. Do not stop taking this medicine suddenly except upon the advice of your doctor. Stopping this medicine too quickly may cause serious side effects or your condition may worsen. A special MedGuide will be given to you by the pharmacist with each prescription and refill. Be sure to read this information carefully each time. Talk to your pediatrician regarding the use of this medicine in children. While this drug may be prescribed for children as young as 71 years of age for selected conditions, precautions do apply. Overdosage: If you think you have taken too much of this medicine contact a poison control center or emergency room at once. NOTE: This medicine is only for you. Do not share this medicine with others. What if I miss a dose? If you miss a dose, take it as soon as you can. If it is almost time for your next dose, take only that dose. Do not take double or extra doses. What may interact with this medicine? Do not take this  medicine with any of the following medications:  desvenlafaxine  levomilnacipran  linezolid  MAOIs like Carbex, Eldepryl, Marplan, Nardil, and Parnate  methylene blue (injected into a vein)  milnacipran  thioridazine  venlafaxine This medicine may also interact with the following medications:  alcohol  amphetamines  aspirin and aspirin-like medicines  certain antibiotics like ciprofloxacin and enoxacin  certain medicines for blood pressure, heart disease, irregular heart beat  certain medicines for depression, anxiety,  or psychotic disturbances  certain medicines for migraine headache like almotriptan, eletriptan, frovatriptan, naratriptan, rizatriptan, sumatriptan, zolmitriptan  certain medicines that treat or prevent blood clots like warfarin, enoxaparin, and dalteparin  cimetidine  fentanyl  lithium  NSAIDS, medicines for pain and inflammation, like ibuprofen or naproxen  phentermine  procarbazine  rasagiline  sibutramine  [employer] [place] Digiulio's wort  theophylline  tramadol  tryptophan This list may not describe all possible interactions. Give your health care provider a list of all the medicines, herbs, non-prescription drugs, or dietary supplements you use. Also tell them if you smoke, drink alcohol, or use illegal drugs. Some items may interact with your medicine. What should I watch for while using this medicine? Tell your doctor if your symptoms do not get better or if they get worse. Visit your doctor or healthcare provider for regular checks on your progress. Because it may take several weeks to see the full effects of this medicine, it is important to continue your treatment as prescribed by your doctor. This medicine may cause serious skin reactions. They can happen weeks to months after starting the medicine. Contact your healthcare provider right away if you notice fevers or flu-like symptoms with a rash. The rash may be red or purple and then turn into blisters or peeling of the skin. Or, you might notice a red rash with swelling of the face, lips, or lymph nodes in your neck or under your arms. Patients and their families should watch out for new or worsening thoughts of suicide or depression. Also watch out for sudden changes in feelings such as feeling anxious, agitated, panicky, irritable, hostile, aggressive, impulsive, severely restless, overly excited and hyperactive, or not being able to sleep. If this happens, especially at the beginning of treatment or after a change in dose, call your  healthcare provider. You may get drowsy or dizzy. Do not drive, use machinery, or do anything that needs mental alertness until you know how this medicine affects you. Do not stand or sit up quickly, especially if you are an older patient. This reduces the risk of dizzy or fainting spells. Alcohol may interfere with the effect of this medicine. Avoid alcoholic drinks. This medicine can cause an increase in blood pressure. This medicine can also cause a sudden drop in your blood pressure, which may make you feel faint and increase the chance of a fall. These effects are most common when you first start the medicine or when the dose is increased, or during use of other medicines that can cause a sudden drop in blood pressure. Check with your doctor for instructions on monitoring your blood pressure while taking this medicine. Your mouth may get dry. Chewing sugarless gum or sucking hard candy, and drinking plenty of water, may help. Contact your doctor if the problem does not go away or is severe. What side effects may I notice from receiving this medicine? Side effects that you should report to your doctor or health care professional as soon as possible:  allergic reactions like skin rash, itching or  hives, swelling of the face, lips, or tongue  anxious  breathing problems  confusion  changes in vision  chest pain  confusion  elevated mood, decreased need for sleep, racing thoughts, impulsive behavior  eye pain  fast, irregular heartbeat  feeling faint or lightheaded, falls  feeling agitated, angry, or irritable  hallucination, loss of contact with reality  high blood pressure  loss of balance or coordination  palpitations  redness, blistering, peeling or loosening of the skin, including inside the mouth  restlessness, pacing, inability to keep still  seizures  stiff muscles  suicidal thoughts or other mood changes  trouble passing urine or change in the amount of  urine  trouble sleeping  unusual bleeding or bruising  unusually weak or tired  vomiting  yellowing of the eyes or skin Side effects that usually do not require medical attention (report to your doctor or health care professional if they continue or are bothersome):  change in sex drive or performance  change in appetite or weight  constipation  dizziness  dry mouth  headache  increased sweating  nausea  tired This list may not describe all possible side effects. Call your doctor for medical advice about side effects. You may report side effects to FDA at 1-800-FDA-1088. Where should I keep my medicine? Keep out of the reach of children. Store at room temperature between 15 and 30 degrees C (59 to 86 degrees F). Throw away any unused medicine after the expiration date. NOTE: This sheet is a summary. It may not cover all possible information. If you have questions about this medicine, talk to your doctor, pharmacist, or health care provider.  2020 Elsevier/Gold Standard (2019-03-02 13:47:50)

## 2020-08-27 NOTE — Progress Notes (Signed)
    SUBJECTIVE:   CHIEF COMPLAINT / HPI:   Pain concerns Patient reports continued low back pain.  She recently had an MRI which showed no advanced findings in the lumbar region.  Mild degenerative disc disease and degenerative facet disease at L2 2-3, L3-4, and L4-5 with minimal disc bulges.  No compressive stenosis of the canal or foramina.  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  Patient reports that the pain is worse on her right compared to the left that she also notices more swelling in the right lower extremity than the left lower extremity.  Patient does report that she tried a short increase in her furosemide to help increase her diuresis but saw no improvement in the lower extremity pain.  We discussed other methods to help with what is presumed to be chronic venous stasis including elevation, low-salt diet which the patient is doing, and elevation.  She will try these.  OBJECTIVE:   BP 126/82   Pulse 100   Ht 5\' 1"  (1.549 m)   Wt 295 lb (133.8 kg)   SpO2 97%   BMI 55.74 kg/m   General: Intermittently tearful due to frustration regarding chronic pain. Cardiac: Regular rate and rhythm, no murmurs appreciated Respiratory: Normal work of breathing MSK: Lower extremity edema noted bilaterally, worse in the right than the left.  Patient reports tenderness to palpation in lower extremities bilaterally.  No erythema or warmth noted, patient also reports low back pain with movement.  ASSESSMENT/PLAN:   Fibromyalgia Patient with history of fibromyalgia.  She has low back pain as well as lower extremity pain and has been referred to pain management.  She did not realize that she missed her pain management appointment in the spring of this year.  Patient is very frustrated regarding the chronic pain and we are going to continue working to find a manageable treatment for this. -Encouraged use of topical  medication such as Voltaren gel, lidocaine patches -Patient is going to call pain management to reschedule an appointment -Discontinued patient's Prozac and started patient on Cymbalta given its chronic pain benefits.  Clearly discussed the weaning process with Prozac and the dosage to start Cymbalta.  Patient voiced clear understanding's.  Instructions also placed in patient's discharge instructions.  Follow-up in approximately 1 month     Gifford Shave, MD [facility]

## 2020-08-30 ENCOUNTER — Other Ambulatory Visit: Payer: Self-pay | Admitting: Family Medicine

## 2020-08-30 NOTE — Assessment & Plan Note (Addendum)
Patient with history of fibromyalgia.  She has low back pain as well as lower extremity pain and has been referred to pain management.  She did not realize that she missed her pain management appointment in the spring of this year.  Patient is very frustrated regarding the chronic pain and we are going to continue working to find a manageable treatment for this. -Encouraged use of topical medication such as Voltaren gel, lidocaine patches -Patient is going to call pain management to reschedule an appointment -Discontinued patient's Prozac and started patient on Cymbalta given its chronic pain benefits.  Clearly discussed the weaning process with Prozac and the dosage to start Cymbalta.  Patient voiced clear understanding's.  Instructions also placed in patient's discharge instructions.  Follow-up in approximately 1 month

## 2020-09-05 ENCOUNTER — Ambulatory Visit: Payer: Medicare Other | Admitting: Cardiology

## 2020-09-10 ENCOUNTER — Telehealth: Payer: Self-pay | Admitting: *Deleted

## 2020-09-10 ENCOUNTER — Ambulatory Visit (INDEPENDENT_AMBULATORY_CARE_PROVIDER_SITE_OTHER): Payer: Medicare Other | Admitting: Cardiology

## 2020-09-10 ENCOUNTER — Other Ambulatory Visit: Payer: Self-pay

## 2020-09-10 ENCOUNTER — Encounter: Payer: Self-pay | Admitting: Cardiology

## 2020-09-10 VITALS — BP 130/74 | HR 100 | Temp 97.2°F | Ht 61.0 in | Wt 303.8 lb

## 2020-09-10 DIAGNOSIS — Z8673 Personal history of transient ischemic attack (TIA), and cerebral infarction without residual deficits: Secondary | ICD-10-CM | POA: Diagnosis not present

## 2020-09-10 DIAGNOSIS — I5032 Chronic diastolic (congestive) heart failure: Secondary | ICD-10-CM | POA: Diagnosis not present

## 2020-09-10 DIAGNOSIS — G4733 Obstructive sleep apnea (adult) (pediatric): Secondary | ICD-10-CM

## 2020-09-10 DIAGNOSIS — R002 Palpitations: Secondary | ICD-10-CM | POA: Diagnosis not present

## 2020-09-10 DIAGNOSIS — I10 Essential (primary) hypertension: Secondary | ICD-10-CM | POA: Diagnosis not present

## 2020-09-10 DIAGNOSIS — Z7189 Other specified counseling: Secondary | ICD-10-CM

## 2020-09-10 NOTE — Telephone Encounter (Signed)
Patient enrolled for [employer] to ship a 7 day ZIO XT long tem holter monitor to her home.

## 2020-09-10 NOTE — Patient Instructions (Signed)
Medication Instructions:  Your Physician recommend you continue on your current medication as directed.    *If you need a refill on your cardiac medications before your next appointment, please call your pharmacy*   Lab Work: None ordered   Testing/Procedures: Our physician has recommended that you wear an  [place] monitor. The Zio patch cardiac monitor continuously records heart rhythm data for up to 14 days, this is for patients being evaluated for multiple types heart rhythms. For the first 24 hours post application, please avoid getting the Zio monitor wet in the shower or by excessive sweating during exercise. After that, feel free to carry on with regular activities. Keep soaps and lotions away from the ZIO XT Patch.   Someone from our office will call to verify address and mail monitor.     Follow-Up: At [facility], you and your health needs are our priority.  As part of our continuing mission to provide you with exceptional heart care, we have created designated Provider Care Teams.  These Care Teams include your primary Cardiologist (physician) and Advanced Practice Providers (APPs -  Physician Assistants and Nurse Practitioners) who all work together to provide you with the care you need, when you need it.  We recommend signing up for the patient portal called "MyChart".  Sign up information is provided on this After Visit Summary.  MyChart is used to connect with patients for Virtual Visits (Telemedicine).  Patients are able to view lab/test results, encounter notes, upcoming appointments, etc.  Non-urgent messages can be sent to your provider as well.   To learn more about what you can do with MyChart, go to [email or web address].    Your next appointment:   6 month(s)  The format for your next appointment:   In Person  Provider:   Buford Dresser, MD

## 2020-09-10 NOTE — Progress Notes (Signed)
Cardiology Office Note:    Date:  09/10/2020   ID:  Megan Dixon, DOB Jul 20, 1966, MRN [id number]  PCP:  Megan Shave, MD  Cardiologist:  Megan Dresser, MD PhD  Referring MD: Dr. Gwyndolyn Saxon Dixon/Dr. Mina Dixon (family medicine)  CC: follow up  History of Present Illness:    Megan Dixon is a 54 y.o. female with a hx of hypertension, obesity, vertigo, prior cerebellar infarction who is seen for follow up today.   I initially met her 09/22/2018 in the hospital  when she was admitted for dizziness and intermittent chest pain. Her blood pressure was also poorly controlled. She was treated for central vertigo and BPPV, and her blood pressure was optimized by weaning clonidine and starting/titrating amlodipine, carvedilol, and losartan. She was also recommended to treat her OSA. She had occasional flutters during her admission, without clear arrhythmia. Recommendation was for monitor placement for further evaluation. She was also started on clopidogrel and atorvastatin for evidence of prior small CVA on imaging.  Today: Struggling with leg pain, bulging disks in her back. Walking with a cane. Both legs hurt all the time, told she has fibromyalgia and connective tissue disease. Lots of posterior headaches. Tender when she pushes on her scalp.   Having more palpitations at night. Makes it difficult to sleep. Happening every day.   Has been taking fluid pills with improvement in her weight. Has had swelling in her feet and legs, now improving.  We discussed options for further workup for palpitations. She is not sure she could do the [place] on her own. Had recent echo, unremarkable beyond grade 1 diastolic dysfunction. She would like to repeat the monitor to determine if there are abnormal rhythms.  Endorses chest tenderness on the right side, worse with palpation. Short of breath with exertion, not at rest. Endorses PND, no orthopnea. Had sleep study in 2017. [place]  LE edema, now improving. Weight also improving. No syncope, frequent palpitations.  Feels like her memory is not what it was before the stroke. Tearful in the office today.  Has worked hard on changing her diet. This has helped with controlling her blood pressure and decreasing her leg swelling.   No longer smoking, quit about 3 mos ago. Congratulated on this.  Denies chest pain, shortness of breath at rest or with normal exertion. No PND, orthopnea, LE edema or unexpected weight gain. No syncope or palpitations.  Past Medical History:  Diagnosis Date  . Arthritis   . Depression   . Fibromyalgia   . Hypertension   . Lupus ([place])    a. was told bloodwork was possibly positive for lupus in 08/2018 by PCP.  Megan Dixon Morbid obesity ([place])   . MS (multiple sclerosis) ([place])    a. presumptive dx in [place], [place]  . OCD (obsessive compulsive disorder)   . OSA (obstructive sleep apnea)    a. dx recently, pending CPAP initiation.  . Palpitations   . Pneumonia 07/2018   left lung    Past Surgical History:  Procedure Laterality Date  . ABDOMINAL SURGERY    . BREAST EXCISIONAL BIOPSY Right   . CARPAL TUNNEL RELEASE    . HERNIA REPAIR    . JOINT REPLACEMENT    . VENTRAL HERNIA REPAIR N/A 08/09/2017   Procedure: Incarcerated ventral hernia repair with mesh ;  Surgeon: Rolm Bookbinder, MD;  Location: [facility];  Service: General;  Laterality: N/A;    Current Medications: Current Outpatient Medications on File Prior to Visit  Medication Sig  .  amLODipine (NORVASC) 5 MG tablet TAKE 1 TABLET BY MOUTH EVERY DAY  . atorvastatin (LIPITOR) 40 MG tablet TAKE 1 TABLET BY MOUTH EVERY DAY  . carvedilol (COREG) 6.25 MG tablet TAKE 1 TABLET (6.25 MG TOTAL) BY MOUTH 2 (TWO) TIMES DAILY WITH A MEAL.  Megan Dixon clopidogrel (PLAVIX) 75 MG tablet TAKE 1 TABLET BY MOUTH EVERY DAY  . cyclobenzaprine (FEXMID) 7.5 MG tablet Take 7.5 mg by mouth 2 (two) times daily as needed.  . DULoxetine (CYMBALTA) 30 MG capsule Take 1  capsule (30 mg total) by mouth daily. Take 1 capsule (30 mg total) by mouth daily for 1 week and then increase to 2 capsules (60 mg total) by mouth daily  . Elastic Bandages & Supports (MEDICAL COMPRESSION STOCKINGS) MISC 1 Container by Does not apply route daily. Apply to both legs daily  . furosemide (LASIX) 20 MG tablet TAKE 1 TABLET BY MOUTH EVERY DAY IN THE MORNING  . gabapentin (NEURONTIN) 600 MG tablet Take 1 tablet (600 mg total) by mouth 3 (three) times daily.  Megan Dixon losartan (COZAAR) 50 MG tablet TAKE 1 TABLET BY MOUTH EVERY DAY  . meclizine (ANTIVERT) 25 MG tablet Take 1 tablet (25 mg total) by mouth 3 (three) times daily as needed for dizziness.  . ondansetron (ZOFRAN ODT) 4 MG disintegrating tablet Take 1 tablet (4 mg total) by mouth every 8 (eight) hours as needed for nausea or vomiting.  . pantoprazole (PROTONIX) 40 MG tablet TAKE 1 TABLET BY MOUTH EVERY DAY  . [DISCONTINUED] amLODipine (NORVASC) 5 MG tablet TAKE 1 TABLET BY MOUTH EVERY DAY  . [DISCONTINUED] carvedilol (COREG) 6.25 MG tablet TAKE 1 TABLET (6.25 MG TOTAL) BY MOUTH 2 (TWO) TIMES DAILY WITH A MEAL.  . [DISCONTINUED] furosemide (LASIX) 20 MG tablet TAKE 1 TABLET BY MOUTH EVERY DAY IN THE MORNING   No current facility-administered medications on file prior to visit.     Allergies:   Morphine and related, Other, Peanut-containing drug products, and Toradol [ketorolac tromethamine]   Social History   Tobacco Use  . Smoking status: Current Every Day Smoker    Types: Cigarettes  . Smokeless tobacco: Never Used  . Tobacco comment: Smoked for 40 years, quit in 08/2018  Vaping Use  . Vaping Use: Never used  Substance Use Topics  . Alcohol use: Not Currently  . Drug use: Never    Family History: The patient's family history includes Breast cancer in an other family member; Heart attack in her brother; Lung cancer in her mother; Seizures in her brother.  ROS:   Please see the history of present illness.  Additional ROS  otherwise unremarkable.   EKGs/Labs/Other Studies Reviewed:    The following studies were reviewed today: Echo 08/09/20 1. Left ventricular ejection fraction, by estimation, is 50 to 55%. The  left ventricle has low normal function. The left ventricle has no regional  wall motion abnormalities. Left ventricular diastolic parameters are  consistent with Grade I diastolic  dysfunction (impaired relaxation).  2. Right ventricular systolic function is normal. The right ventricular  size is normal.  3. The mitral valve is normal in structure. No evidence of mitral valve  regurgitation. No evidence of mitral stenosis.  4. The aortic valve is normal in structure. Aortic valve regurgitation is  not visualized. No aortic stenosis is present.  5. The inferior vena cava is normal in size with greater than 50%  respiratory variability, suggesting right atrial pressure of 3 mmHg.   Monitor 11/15/2018 8 days  of available data on Zio monitor. Patient had a min HR of 69 bpm, max HR of 136 bpm, and avg HR of 102 bpm. Predominant underlying rhythm was Sinus Rhythm. 1 run of Ventricular Tachycardia occurred lasting 4 beats with a max rate of 120 bpm (avg 111 bpm). Isolated SVEs were rare (<1.0%), and no SVE Couplets or SVE Triplets were present. Isolated VEs were rare (<1.0%), VE Couplets were rare (<1.0%), and no VE Triplets were present. Ventricular Bigeminy was present, duration 3.6 seconds. 4 triggered events, all sinus rhythm or sinus with ventricular ectopy. No atrial fibrillation, pauses, or high degree AV block.   Echo 09/20/18 Study Conclusions - Left ventricle: The cavity size was mildly dilated. Wall   thickness was increased in a pattern of mild LVH. Systolic   function was mildly reduced. The estimated ejection fraction was   in the range of 45% to 50%. Diffuse hypokinesis. Doppler   parameters are consistent with abnormal left ventricular   relaxation (grade 1 diastolic  dysfunction).  Impressions: - Mild global reduction in LV systolic function (EF 50); mild LVE;   mild LVH; mild diastolic dysfunction.  EKG:  EKG is ordered today.  The ekg ordered today demonstrates normal sinus rhythm at 100 bpm  Recent Labs: 08/06/2020: ALT 12; BUN 12; Creatinine, Ser 0.70; Hemoglobin 13.8; Platelets 271; Potassium 3.9; Sodium 140  Recent Lipid Panel    Component Value Date/Time   CHOL 156 11/23/2019 1647   TRIG 191 (H) 11/23/2019 1647   HDL 35 (L) 11/23/2019 1647   CHOLHDL 4.5 (H) 11/23/2019 1647   CHOLHDL 5.4 09/18/2018 1850   VLDL 26 09/18/2018 1850   LDLCALC 88 11/23/2019 1647    Physical Exam:    VS:  BP 130/74   Pulse 100   Temp (!) 97.2 F (36.2 C)   Ht _0  (1.549 m)   Wt (!) 303 lb 12.8 oz (137.8 kg)   SpO2 92%   BMI 57.40 kg/m     Wt Readings from Last 3 Encounters:  09/10/20 (!) 303 lb 12.8 oz (137.8 kg)  08/27/20 295 lb (133.8 kg)  08/06/20 293 lb 9.6 oz (133.2 kg)    GEN: Well nourished, well developed in no acute distress HEENT: Normal, moist mucous membranes NECK: No JVD CARDIAC: regular rhythm, normal S1 and S2, no rubs or gallops. No murmur. VASCULAR: Radial and DP pulses 2+ bilaterally. No carotid bruits RESPIRATORY:  Clear to auscultation without rales, wheezing or rhonchi  ABDOMEN: Soft, non-tender, non-distended MUSCULOSKELETAL:  Ambulates independently SKIN: Warm and dry, bilateral LE edema that is trace pitting and primarily brawny/nonpitting to upper calf, right slightly greater than left NEUROLOGIC:  Alert and oriented x 3. No focal neuro deficits noted. PSYCHIATRIC:  Normal affect, but intermittently tearful  ASSESSMENT:    1. Palpitation   2. Chronic diastolic heart failure ([place])   3. Morbid obesity ([place])   4. History of CVA (cerebrovascular accident)   5. Essential hypertension   6. Counseling on health promotion and disease prevention   7. OSA (obstructive sleep apnea)    PLAN:    Hypertension: at goal  today -continue amlodipine 5 mg, carvedilol 6.25 mg BID, losartan 50 mg daily  Chronic diastolic heart failure, chronic LE edema -continue furosemide daily, as this has improved her swelling significantly -grade 1 diastolic dysfunction on recent echo -given heart failure education today  Palpitations: unclear etiology. -monitor did not show significant arrhythmias linked to her symptoms previously -discussed options. Will repeat monitor  Morbid obesity: BMI 57 -she is limited in her activity -she wants to lose weight but is frustrated -we discussed possible strategies today -she is working on diet changes  OSA: recommended CPAP use  History of CVA: -on atorvastatin 40 mg daily -on clopidogrel daily  CV risk counseling and prevention: -recommend heart healthy/Mediterranean diet, with whole grains, fruits, vegetable, fish, lean meats, nuts, and olive oil. Limit salt. -recommend moderate walking, 3-5 times/week for 30-50 minutes each session. Aim for at least 150 minutes.week. Goal should be pace of 3 miles/hours, or walking 1.5 miles in 30 minutes -recommend avoidance of tobacco products. Avoid excess alcohol.  Plan for follow up: 6 mos or sooner based on results of monitor  Medication Adjustments/Labs and Tests Ordered: Current medicines are reviewed at length with the patient today.  Concerns regarding medicines are outlined above.  Orders Placed This Encounter  Procedures  . LONG TERM MONITOR (3-14 DAYS)  . EKG 12-Lead   No orders of the defined types were placed in this encounter.   Patient Instructions  Medication Instructions:  Your Physician recommend you continue on your current medication as directed.    *If you need a refill on your cardiac medications before your next appointment, please call your pharmacy*   Lab Work: None ordered   Testing/Procedures: Our physician has recommended that you wear an  [place] monitor. The Zio patch cardiac monitor  continuously records heart rhythm data for up to 14 days, this is for patients being evaluated for multiple types heart rhythms. For the first 24 hours post application, please avoid getting the Zio monitor wet in the shower or by excessive sweating during exercise. After that, feel free to carry on with regular activities. Keep soaps and lotions away from the ZIO XT Patch.   Someone from our office will call to verify address and mail monitor.     Follow-Up: At [facility], you and your health needs are our priority.  As part of our continuing mission to provide you with exceptional heart care, we have created designated Provider Care Teams.  These Care Teams include your primary Cardiologist (physician) and Advanced Practice Providers (APPs -  Physician Assistants and Nurse Practitioners) who all work together to provide you with the care you need, when you need it.  We recommend signing up for the patient portal called "MyChart".  Sign up information is provided on this After Visit Summary.  MyChart is used to connect with patients for Virtual Visits (Telemedicine).  Patients are able to view lab/test results, encounter notes, upcoming appointments, etc.  Non-urgent messages can be sent to your provider as well.   To learn more about what you can do with MyChart, go to [email or web address].    Your next appointment:   6 month(s)  The format for your next appointment:   In Person  Provider:   Buford Dresser, MD        Signed, Megan Dresser, MD PhD 09/10/2020  [place]

## 2020-09-11 ENCOUNTER — Encounter: Payer: Self-pay | Admitting: Cardiology

## 2020-09-16 ENCOUNTER — Other Ambulatory Visit (INDEPENDENT_AMBULATORY_CARE_PROVIDER_SITE_OTHER): Payer: Medicare Other

## 2020-09-16 DIAGNOSIS — R002 Palpitations: Secondary | ICD-10-CM

## 2020-09-20 ENCOUNTER — Other Ambulatory Visit: Payer: Self-pay | Admitting: Family Medicine

## 2020-09-20 ENCOUNTER — Ambulatory Visit: Payer: Medicare Other | Admitting: Family Medicine

## 2020-09-22 ENCOUNTER — Other Ambulatory Visit: Payer: Self-pay | Admitting: Family Medicine

## 2020-09-26 ENCOUNTER — Other Ambulatory Visit: Payer: Self-pay | Admitting: Family Medicine

## 2020-09-30 ENCOUNTER — Ambulatory Visit (INDEPENDENT_AMBULATORY_CARE_PROVIDER_SITE_OTHER): Payer: Medicare Other | Admitting: Family Medicine

## 2020-09-30 ENCOUNTER — Other Ambulatory Visit: Payer: Self-pay

## 2020-09-30 ENCOUNTER — Encounter: Payer: Self-pay | Admitting: Family Medicine

## 2020-09-30 VITALS — BP 124/60 | HR 98 | Ht 61.0 in | Wt 302.2 lb

## 2020-09-30 DIAGNOSIS — I10 Essential (primary) hypertension: Secondary | ICD-10-CM | POA: Diagnosis not present

## 2020-09-30 DIAGNOSIS — M545 Low back pain, unspecified: Secondary | ICD-10-CM | POA: Diagnosis not present

## 2020-09-30 DIAGNOSIS — G8929 Other chronic pain: Secondary | ICD-10-CM

## 2020-09-30 DIAGNOSIS — F32A Depression, unspecified: Secondary | ICD-10-CM

## 2020-09-30 DIAGNOSIS — E782 Mixed hyperlipidemia: Secondary | ICD-10-CM

## 2020-09-30 MED ORDER — DULOXETINE HCL 30 MG PO CPEP: 90.0000 mg | ORAL_CAPSULE | Freq: Every day | ORAL | 3 refills | Status: DC

## 2020-09-30 NOTE — Patient Instructions (Signed)
It was a pleasure to see you today.  I am sorry you are still having these issues with your low back pain but recommend you follow-up with the pain clinic.  I am going to send a referral in for orthopedics who can do possible injections in your low back which may help alleviate the pain.  This referral may take 2 weeks to process.  We are also going to increase your Cymbalta to 90 mg daily.  If you have any questions or concerns please feel free to call the clinic.  I hope you have a wonderful afternoon!

## 2020-09-30 NOTE — Assessment & Plan Note (Signed)
Patient with continued issues with low back pain.  MRI on 8/27 showed mild curvature convex to left with apex at L2-03.  Somewhat exaggerated lumbar lordosis.  Mild degenerative disc disease and degenerative facet disease at L2-3, L3-4, and L4-5 with minimal disc bulges.  No compressive stenosis of the canal or foramina.  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  Patient is going to call to schedule an appointment with pain management. -Follow-up on pain management appointment -Increase Cymbalta to 90 mg daily -Consider neurology versus neurosurgery referral

## 2020-09-30 NOTE — Progress Notes (Signed)
SUBJECTIVE:   CHIEF COMPLAINT / HPI:   Low back pain Patient reports continued issues with low back pain.  She reports the pain radiates down her legs worse on the left than the right.  This is a chronic issue for which she has seen orthopedics for in the past and reports that she received a injection when she was in [place] and will "never do that again".  Patient just wants some help with the pain so that she is able to walk and exercise again.  We recently started her on Cymbalta and had to transition her from Prozac to the Cymbalta.  She has tolerated this well and reports she does not notice much difference in her pain.  She has questions regarding a neurosurgery referral.  We discussed that given her MRI findings this may not be helpful at this time.  Patient understands this and reports that she is going to call her pain management provider to schedule an appointment given she has not yet done that.   Depression Patient reports her depression is doing "okay" but that she has spells where it gets bad.  She reports that the Cymbalta is helping but is similar to how much the Prozac was helping prior.  Reports that she does not feel much improvement in her chronic pain with the Cymbalta.  Denies any SI or HI.  Reports that her grandson makes her happy but that she really would like to be able to walk again and help deal with the pain.   Hypercholesterolemia Patient reports she has been working on her diet and has been taking her statin as prescribed but is interested in what her lipid panel looks like at this time.  Last checked was December showed hypertriglyceridemia.  LDL was 88.    OBJECTIVE:   BP 124/60   Pulse 98   Ht 5\' 1"  (1.549 m)   Wt (!) 302 lb 3.2 oz (137.1 kg)   SpO2 95%   BMI 57.10 kg/m   General: Obese female, no acute distress, intermittently tearful when discussing her depression Respiratory: Normal work of breathing Abdomen: Soft, obese MSK: Patient with  tenderness down the back of her legs as well as paraspinal in the lumbar region. Psych: Patient denies SI or HI but reports that she has intermittent episodes of worsening depression.  Reports that the Cymbalta is helping but feels like she needs a stronger dose.   ASSESSMENT/PLAN:   Hyperlipidemia Most recent lipid panel almost 1 year ago with elevated triglycerides.  Patient currently on a statin. -Repeat lipid panel today  Depression Patient with continued issues with depression.  Previously prescribed Prozac 60 mg which was recently transitioned to Cymbalta 60 mg.  She reports the Cymbalta is helping but she feels like she may need a higher dose.  PHQ-9 today was 12 from 13 at previous visit.  Things that help with her depression include spending time with her grandson as well as walking with her grandson when she is able to but given her low back pain she is unable to.  She has not yet followed up with pain management but reports she will call them today. -Increased Cymbalta to 90 mg daily -Follow-up at next visit -Follow-up on appointment with pain management   Chronic low back pain Patient with continued issues with low back pain.  MRI on 8/27 showed mild curvature convex to left with apex at L2-03.  Somewhat exaggerated lumbar lordosis.  Mild degenerative disc disease and degenerative  facet disease at L2-3, L3-4, and L4-5 with minimal disc bulges.  No compressive stenosis of the canal or foramina.  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  Patient is going to call to schedule an appointment with pain management. -Follow-up on pain management appointment -Increase Cymbalta to 90 mg daily -Consider neurology versus neurosurgery referral     Gifford Shave, MD [place]

## 2020-09-30 NOTE — Assessment & Plan Note (Signed)
Most recent lipid panel almost 1 year ago with elevated triglycerides.  Patient currently on a statin. -Repeat lipid panel today

## 2020-09-30 NOTE — Assessment & Plan Note (Signed)
Patient with continued issues with depression.  Previously prescribed Prozac 60 mg which was recently transitioned to Cymbalta 60 mg.  She reports the Cymbalta is helping but she feels like she may need a higher dose.  PHQ-9 today was 12 from 13 at previous visit.  Things that help with her depression include spending time with her grandson as well as walking with her grandson when she is able to but given her low back pain she is unable to.  She has not yet followed up with pain management but reports she will call them today. -Increased Cymbalta to 90 mg daily -Follow-up at next visit -Follow-up on appointment with pain management

## 2020-10-01 LAB — LIPID PANEL
Chol/HDL Ratio: 3.9 ratio (ref 0.0–4.4)
Cholesterol, Total: 118 mg/dL (ref 100–199)
HDL: 30 mg/dL — ABNORMAL LOW (ref >39)
LDL Chol Calc (NIH): 65 mg/dL (ref 0–99)
Triglycerides: 128 mg/dL (ref 0–149)
VLDL Cholesterol Cal: 23 mg/dL (ref 5–40)

## 2020-10-01 NOTE — Progress Notes (Signed)
Called patient and informed her of her lipid panel results.  Patient is grateful for the call.  No medication changes at this time.

## 2020-10-03 DIAGNOSIS — R002 Palpitations: Secondary | ICD-10-CM | POA: Diagnosis not present

## 2020-10-23 ENCOUNTER — Telehealth: Payer: Self-pay | Admitting: Physical Medicine and Rehabilitation

## 2020-10-23 ENCOUNTER — Ambulatory Visit (INDEPENDENT_AMBULATORY_CARE_PROVIDER_SITE_OTHER): Payer: Medicare Other | Admitting: Physical Medicine and Rehabilitation

## 2020-10-23 ENCOUNTER — Encounter: Payer: Self-pay | Admitting: Physical Medicine and Rehabilitation

## 2020-10-23 ENCOUNTER — Other Ambulatory Visit: Payer: Self-pay

## 2020-10-23 VITALS — BP 144/92 | HR 92

## 2020-10-23 DIAGNOSIS — M5116 Intervertebral disc disorders with radiculopathy, lumbar region: Secondary | ICD-10-CM | POA: Diagnosis not present

## 2020-10-23 DIAGNOSIS — M797 Fibromyalgia: Secondary | ICD-10-CM | POA: Diagnosis not present

## 2020-10-23 DIAGNOSIS — G894 Chronic pain syndrome: Secondary | ICD-10-CM | POA: Diagnosis not present

## 2020-10-23 DIAGNOSIS — M47816 Spondylosis without myelopathy or radiculopathy, lumbar region: Secondary | ICD-10-CM | POA: Diagnosis not present

## 2020-10-23 DIAGNOSIS — G8929 Other chronic pain: Secondary | ICD-10-CM

## 2020-10-23 DIAGNOSIS — M5442 Lumbago with sciatica, left side: Secondary | ICD-10-CM

## 2020-10-23 NOTE — Telephone Encounter (Signed)
Pt not req Auth#. 

## 2020-10-23 NOTE — Telephone Encounter (Signed)
Is auth needed for [id number]? Scheduled for 11/11.

## 2020-10-23 NOTE — Progress Notes (Signed)
Low back pain. Left side is worse. Buttock pain radiating down left leg when walking- lateral thigh. Right knee pain.  States that she has been diagnosed with fibromyalgia and a connective tissue disease. Numeric Pain Rating Scale and Functional Assessment Average Pain 9 Pain Right Now 9 My pain is constant, dull and aching Pain is worse with: walking Pain improves with: rest   In the last MONTH (on 0-10 scale) has pain interfered with the following?  1. General activity like being  able to carry out your everyday physical activities such as walking, climbing stairs, carrying groceries, or moving a chair?  Rating(10)  2. Relation with others like being able to carry out your usual social activities and roles such as  activities at home, at work and in your community. Rating(10)  3. Enjoyment of life such that you have  been bothered by emotional problems such as feeling anxious, depressed or irritable?  Rating(9)

## 2020-10-24 ENCOUNTER — Encounter: Payer: Self-pay | Admitting: Physical Medicine and Rehabilitation

## 2020-10-24 ENCOUNTER — Ambulatory Visit (INDEPENDENT_AMBULATORY_CARE_PROVIDER_SITE_OTHER): Payer: Medicare Other | Admitting: Physical Medicine and Rehabilitation

## 2020-10-24 ENCOUNTER — Ambulatory Visit: Payer: Self-pay

## 2020-10-24 VITALS — BP 154/91 | HR 103

## 2020-10-24 DIAGNOSIS — M5116 Intervertebral disc disorders with radiculopathy, lumbar region: Secondary | ICD-10-CM

## 2020-10-24 MED ORDER — METHYLPREDNISOLONE ACETATE 80 MG/ML IJ SUSP
80.0000 mg | Freq: Once | INTRAMUSCULAR | Status: AC
  Administered 2020-10-24: 80 mg

## 2020-10-24 NOTE — Progress Notes (Signed)
Pt state lower back pain that travels to her left buttock. Pt state walking and standing makes the pain worse. Pt state she has to take breaks when cleaning the house. Pt state she take pain meds and put pillows between her leg to ease the pain.  Numeric Pain Rating Scale and Functional Assessment Average Pain 10   In the last MONTH (on 0-10 scale) has pain interfered with the following?  1. General activity like being  able to carry out your everyday physical activities such as walking, climbing stairs, carrying groceries, or moving a chair?  Rating(10)   +Driver, -BT, -Dye Allergies.

## 2020-10-24 NOTE — Progress Notes (Signed)
Megan Dixon - 54 y.o. female MRN [id number]  Date of birth: July 04, 1966  Office Visit Note: Visit Date: 10/23/2020 PCP: Gifford Shave, MD Referred by: Gifford Shave, MD  Subjective: Chief Complaint  Patient presents with  . Lower Back - Pain   HPI: Megan Dixon is a 54 y.o. female who comes in today For evaluation and management of chronic severe and worsening over the last year of low back pain and left radicular type buttock and thigh pain.  She comes in today at the request of family medicine resident Lissa Morales, MD and her primary care physician Gifford Shave, MD. I did review several pages of their notes on the patient's chronic condition.  Her case is complicated by history of fibromyalgia and questionable lupus and questionable multiple sclerosis.  Reviewing the chart shows that she saw a rheumatology in January 2020 but does not have any follow-ups at least on file.  She does not appear to be followed by neurology.  Nonetheless the patient describes 9 out of 10 constant dull and aching pain all the time particularly in the low back and left buttock and leg.  The back pain is there all the time and the leg pain is intermittent with walking and standing.  She also endorses right knee pain with history of treatment for the knee at [facility].  She tells me that she has been diagnosed with fibromyalgia and connective tissue disease.  She denies any prior lumbar spine surgery but while living in [facility] she did receive an epidural injection that she really did not like having has some apprehension about having anything like that done.  She has had physical therapy in the past but not recently.  She does continue with home exercises.  She is also on a weight loss management program and trying to lose weight.  Her case is complicated by morbid obesity.  She currently takes duloxetine as well as cyclobenzaprine and gabapentin.  She reports that these really do not  touch her pain.  She reports her pain does limit her daily activities to a great degree and she is been depressed and frustrated with the amount of pain that she has had.  She has allergies or intolerances to Toradol and morphine related products.  Opioids probably not a good treatment choice for this patient anyway.  She was referred to pain management back in January and was excepted at the [facility] for pain and rehabilitation.  I do not see any notes from them some not sure if she has been there to see them.  She has had updated MRI of the lumbar spine and this is reviewed with the patient today and the report is reviewed below in the chart.  We use spine models and imaging to discuss her spine.  She denies any groin pain she denies any focal weakness any bowel or bladder changes and no red flag complaints.  At least on the medical record it list her taking Plavix and with long discussion to the patient that she is not taking Plavix that she was really not needed at the time they did start that at some point she felt like because they thought she had a stroke and it was started but she no longer takes Plavix.  I did delete this from the medical record.  Review of Systems  Musculoskeletal: Positive for back pain and joint pain.  All other systems reviewed and are negative.  Otherwise per HPI.  Assessment &  Plan: Visit Diagnoses:  1. Spondylosis without myelopathy or radiculopathy, lumbar region   2. Radiculopathy due to lumbar intervertebral disc disorder   3. Chronic bilateral low back pain with left-sided sciatica   4. Fibromyalgia   5. Chronic pain syndrome     Plan: Findings:  Chronic long-term recalcitrant low back pain now with 1 year of worsening low back and left hip and leg pain.  This is in the setting of MRI findings of a fairly mild facet joint arthritic lumbar spine without any high-grade stenosis or nerve compression but there is left foraminal lateral recess disc  protrusion/bulging coupled with mild arthritis that could cause a lateral recess narrowing on the left at L4-5 which could irritate the L5 nerve root which would fit with her symptoms at least in the left hip and leg.  I discussed this at length with her.  We also discussed at length about fibromyalgia which appears to be sort of a symptom magnifier in terms of having something not necessarily severe looking on images but causing significant pain.  After long discussion with her regarding continue her current medications through her primary care physician.  If she needed further pain management more of opioid type medicine which I would not really recommend then referral to pain management would be necessary.  I do think a diagnostic epidural injection from an interlaminar approach at L5-S1 would be appropriate.  Could consider facet joint block if needed.  Patient will continue with active weight loss management.    Meds & Orders: No orders of the defined types were placed in this encounter.  No orders of the defined types were placed in this encounter.   Follow-up: Return for Left L5-S1 interlaminar epidural steroid injection.   Procedures: No procedures performed  No notes on file   Clinical History: MRI LUMBAR SPINE WITHOUT CONTRAST  TECHNIQUE: Multiplanar, multisequence MR imaging of the lumbar spine was performed. No intravenous contrast was administered.  COMPARISON:  None.  FINDINGS: Segmentation:  5 lumbar type vertebral bodies assumed.  Alignment: Mild curvature convex to the left with the apex at L2-3. Somewhat exaggerated lumbar lordosis.  Vertebrae:  No fracture or primary bone lesion.  Conus medullaris and cauda equina: Conus extends to the L1-2 level. Conus and cauda equina appear normal.  Paraspinal and other soft tissues: Negative  Disc levels:  No abnormality at L1-2 or above.  L2-3: Mild desiccation and minimal bulging of the disc. Mild facet and  ligamentous prominence. No compressive stenosis.  L3-4: Desiccation and mild bulging of the disc. Mild facet and ligamentous prominence. No compressive stenosis.  L4-5: Minimal bulging of the disc. Mild facet and ligamentous prominence. No compressive stenosis.  L5-S1: Normal appearance of the disc. Mild facet and ligamentous prominence. Facet degeneration worse on the left, which could contribute to low back pain. No compressive stenosis.  IMPRESSION: 1. No advanced finding in the lumbar region. Mild curvature convex to the left with the apex at L2-3. Somewhat exaggerated lumbar lordosis. 2. Mild degenerative disc disease and degenerative facet disease at L2-3, L3-4 and L4-5 with minimal disc bulges. No compressive stenosis of the canal or foramina. 3. 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   Electronically Signed   By: Nelson Chimes M.D.   On: 08/09/2020 15:28   She reports that she has quit smoking. Her smoking use included cigarettes. She has never used smokeless tobacco.  Recent Labs  11/23/19 1647  HGBA1C 5.9*    Objective:  VS:  HT:    WT:   BMI:     BP:(!) 144/92  HR:92bpm  TEMP: ( )  RESP:  Physical Exam Vitals and nursing note reviewed.  Constitutional:      General: She is not in acute distress.    Appearance: Normal appearance. She is well-developed. She is obese.  HENT:     Head: Normocephalic and atraumatic.     Nose: Nose normal.     Mouth/Throat:     Mouth: Mucous membranes are moist.     Pharynx: Oropharynx is clear.  Eyes:     Conjunctiva/sclera: Conjunctivae normal.     Pupils: Pupils are equal, round, and reactive to light.  Cardiovascular:     Rate and Rhythm: Regular rhythm.  Pulmonary:     Effort: Pulmonary effort is normal. No respiratory distress.  Abdominal:     General: There is no distension.     Palpations: Abdomen is soft.     Tenderness: There is no  guarding.  Musculoskeletal:     Cervical back: Normal range of motion and neck supple. Tenderness present.     Right lower leg: No edema.     Left lower leg: No edema.     Comments: Patient is slow to rise from a seated position to extension she does have some pain with facet loading and extension.  She has tenderness across the lower back and PSIS and greater trochanters but not exquisitely tender.  Essentially tender points.  She has no pain with hip rotation internally or externally.  She has good distal strength without clonus.  She has an equivocally positive slump test on the left.  Sensation intact.  Skin:    General: Skin is warm and dry.     Findings: No erythema or rash.  Neurological:     General: No focal deficit present.     Mental Status: She is alert and oriented to person, place, and time.     Sensory: No sensory deficit.     Motor: No weakness or abnormal muscle tone.     Coordination: Coordination normal.     Gait: Gait abnormal.  Psychiatric:        Mood and Affect: Mood normal.        Behavior: Behavior normal.        Thought Content: Thought content normal.     Ortho Exam  Imaging: No results found.  Past Medical/Family/Surgical/Social History: Medications & Allergies reviewed per EMR, new medications updated. Patient Active Problem List   Diagnosis Date Noted  . Palpitation 09/10/2020  . Chronic diastolic heart failure ([place]) 09/10/2020  . History of CVA (cerebrovascular accident) 09/10/2020  . Congestive heart failure ([place]) 08/08/2020  . Lower extremity edema 08/08/2020  . Lower extremity pain, posterior, left 01/29/2020  . Hyperlipidemia 01/09/2020  . Depression 01/01/2020  . Bilateral lower extremity pain 01/01/2020  . Peripheral neuropathy 11/23/2019  . Prediabetes 11/23/2019  . Axillary lump, right 11/23/2019  . Acute vestibular syndrome 08/29/2019  . Ischemic vascular disease 08/29/2019  . Migraine 08/29/2019  . Morbid obesity ([place])   . Family  history of systemic lupus erythematosus 09/10/2018  . Anxiety disorder 09/07/2018  . Hypertension 08/01/2018  . Fibromyalgia 08/01/2018  . Blood-tinged sputum 08/01/2018  . Chronic low back pain 08/16/2017  . Gastroesophageal reflux disease 08/16/2017  . Incarcerated incisional hernia 08/10/2017  . S/P exploratory laparotomy 08/09/2017   Past Medical History:  Diagnosis  Date  . Arthritis   . Depression   . Fibromyalgia   . Hypertension   . Lupus ([place])    a. was told bloodwork was possibly positive for lupus in 08/2018 by PCP.  Marland Kitchen Morbid obesity ([place])   . MS (multiple sclerosis) ([place])    a. presumptive dx in [place]  . OCD (obsessive compulsive disorder)   . OSA (obstructive sleep apnea)    a. dx recently, pending CPAP initiation.  . Palpitations   . Pneumonia 07/2018   left lung   Family History  Problem Relation Age of Onset  . Lung cancer Mother   . Seizures Brother   . Heart attack Brother        Happened during seizure  . Breast cancer Other    Past Surgical History:  Procedure Laterality Date  . ABDOMINAL SURGERY    . BREAST EXCISIONAL BIOPSY Right   . CARPAL TUNNEL RELEASE    . HERNIA REPAIR    . JOINT REPLACEMENT    . VENTRAL HERNIA REPAIR N/A 08/09/2017   Procedure: Incarcerated ventral hernia repair with mesh ;  Surgeon: Rolm Bookbinder, MD;  Location: [facility];  Service: General;  Laterality: N/A;   Social History   Occupational History  . Not on file  Tobacco Use  . Smoking status: Former Smoker    Types: Cigarettes  . Smokeless tobacco: Never Used  . Tobacco comment: Smoked for 40 years, quit in 08/2018  Vaping Use  . Vaping Use: Never used  Substance and Sexual Activity  . Alcohol use: Not Currently  . Drug use: Never  . Sexual activity: Not on file

## 2020-11-10 ENCOUNTER — Other Ambulatory Visit: Payer: Self-pay | Admitting: Family Medicine

## 2020-11-15 ENCOUNTER — Telehealth: Payer: Self-pay | Admitting: Cardiology

## 2020-11-15 NOTE — Telephone Encounter (Signed)
Monitor results: Buford Dresser, MD 11/15/2020 8:01 AM EST No significant rhythm issues. Rare early beats, which everyone has, but nothing that is high risk  LM2CB

## 2020-11-15 NOTE — Telephone Encounter (Signed)
Follow Up:    Pt is returning a call from today, she does not know who called her.

## 2020-11-15 NOTE — Telephone Encounter (Signed)
Pt updated with monitor results and verbalized understanding.  

## 2020-11-20 ENCOUNTER — Other Ambulatory Visit: Payer: Self-pay | Admitting: Family Medicine

## 2020-11-25 ENCOUNTER — Telehealth: Payer: Self-pay

## 2020-11-25 NOTE — Telephone Encounter (Signed)
Could you please have the patient schedule an appointment so that we can transition her medications.   Thanks!  Christy Sartorius

## 2020-11-25 NOTE — Telephone Encounter (Signed)
Patient calls nurse line regarding medication management for depression. Patient reports that current medication, Duloxetine, is not working for her and would like to switch back to fluoxetine.   Advised patient that she may need appointment to discuss medication change. Patient requests to wait for further instruction from PCP.   Talbot Grumbling, RN

## 2020-11-25 NOTE — Telephone Encounter (Signed)
Called patient back. No answer, left HIPAA compliant VM for patient to return call to office to schedule appointment.   Talbot Grumbling, RN

## 2020-12-03 ENCOUNTER — Telehealth: Payer: Self-pay | Admitting: Physical Medicine and Rehabilitation

## 2020-12-03 NOTE — Telephone Encounter (Signed)
L5-S1 IL on 11/11. Ok to repeat if helped, same problem/side, and no new injury?

## 2020-12-03 NOTE — Telephone Encounter (Signed)
Pt called stating she got an injection 10/24/20 and the pain has come back so she would like to get a repeat injection appt  [phone]

## 2020-12-03 NOTE — Telephone Encounter (Signed)
If helped more than 60% other wise facets L5-S1

## 2020-12-04 NOTE — Telephone Encounter (Signed)
Patient reports more than 60% relief. Scheduled for repeat on 1/17 at 1100 with driver and no blood thinners.

## 2020-12-09 ENCOUNTER — Other Ambulatory Visit: Payer: Self-pay | Admitting: Family Medicine

## 2020-12-10 ENCOUNTER — Encounter: Payer: Self-pay | Admitting: Family Medicine

## 2020-12-10 ENCOUNTER — Ambulatory Visit (INDEPENDENT_AMBULATORY_CARE_PROVIDER_SITE_OTHER): Payer: Medicare Other | Admitting: Family Medicine

## 2020-12-10 ENCOUNTER — Other Ambulatory Visit: Payer: Self-pay

## 2020-12-10 VITALS — BP 150/86 | HR 107 | Ht 61.0 in | Wt 315.2 lb

## 2020-12-10 DIAGNOSIS — F32A Depression, unspecified: Secondary | ICD-10-CM

## 2020-12-10 DIAGNOSIS — G4733 Obstructive sleep apnea (adult) (pediatric): Secondary | ICD-10-CM | POA: Diagnosis not present

## 2020-12-10 MED ORDER — FLUOXETINE HCL 20 MG PO TABS: 60.0000 mg | ORAL_TABLET | Freq: Every day | ORAL | 3 refills | Status: DC

## 2020-12-10 MED ORDER — BENZONATATE 100 MG PO CAPS: 100.0000 mg | ORAL_CAPSULE | Freq: Two times a day (BID) | ORAL | 0 refills | Status: DC | PRN

## 2020-12-10 NOTE — Assessment & Plan Note (Signed)
Patient with diagnosis of OSA in 2017.  Never followed up on CPAP initiation.  Is having issues with sleep at this time and reports she has been snoring and waking up at around 3-4 AM.  Discussed good sleep hygiene but also feel patient needs to be reevaluated and titrated for CPAP. -Sleep study ordered -Follow-up as needed

## 2020-12-10 NOTE — Assessment & Plan Note (Signed)
Patient reports that her depression has worsened since switching from the Prozac to the Cymbalta she has seen no benefit from a pain standpoint.  Would like to transition back to the Prozac.  PHQ-9 was 10 today from 12 at previous visit.  We are going to transition back to Prozac.  Patient will decrease Cymbalta dose to 60 mg for 5 days, 30 mg for 5 days, then switch to Prozac 20 mg for 3 days, 40 mg for 3 days, and then increase to 60 mg daily. -Follow-up in 1 month -Strict return precautions given

## 2020-12-10 NOTE — Progress Notes (Signed)
    SUBJECTIVE:   CHIEF COMPLAINT / HPI:   Depression and chronic pain follow-up Patient reports that her chronic pain has not improved since the previous visit and since switching to Cymbalta but that her depression has worsened and she wants to go back on the Prozac because she felt like that helped the most with her depression.  Denies any thoughts of SI or HI.  Sleep issues Patient reports that she takes melatonin to go to sleep and she takes it around 10 PM.  It helps greatly with her falling asleep but she reports that around 330 to 4 AM she wakes up and will be up for a few hours and eventually fall back asleep until around 7.  She reports this is a new issue and she is concerned about her sleep.  Acknowledges that she has been tested for sleep apnea in the past and that she had a sleep study completed "years ago" but never heard anything after the sleep study.  Per chart review the patient was diagnosed with sleep apnea and a CPAP was recommended but she never received one.  Cough Patient reports a dry cough for several weeks with mild sore throat.  She has used cough drops and hot tea with moderate relief.  Reports that she has had Tessalon Perles in the past and they really helped with the cough and would like these again.  PERTINENT  PMH / PSH: OSA, depression, chronic pain  OBJECTIVE:   BP (!) 150/86   Pulse (!) 107   Ht 5\' 1"  (1.549 m)   Wt (!) 315 lb 3.2 oz (143 kg)   SpO2 94%   BMI 59.56 kg/m   General: Obese 54 year old female in no acute distress Respiratory: Normal work of breathing, occasional expiratory wheezes noted Cardiac: Regular rate and rhythm, no murmurs appreciated Abdominal: Soft, nontender, positive bowel sounds MSK: Diffuse low back tenderness in the lumbar paraspinal region.  Right knee pain with varus and valgus pressure, pain with flexion and extension.  Diffuse tenderness to palpation all over right knee  ASSESSMENT/PLAN:   Depression Patient  reports that her depression has worsened since switching from the Prozac to the Cymbalta she has seen no benefit from a pain standpoint.  Would like to transition back to the Prozac.  PHQ-9 was 10 today from 12 at previous visit.  We are going to transition back to Prozac.  Patient will decrease Cymbalta dose to 60 mg for 5 days, 30 mg for 5 days, then switch to Prozac 20 mg for 3 days, 40 mg for 3 days, and then increase to 60 mg daily. -Follow-up in 1 month -Strict return precautions given  OSA (obstructive sleep apnea) Patient with diagnosis of OSA in 2017.  Never followed up on CPAP initiation.  Is having issues with sleep at this time and reports she has been snoring and waking up at around 3-4 AM.  Discussed good sleep hygiene but also feel patient needs to be reevaluated and titrated for CPAP. -Sleep study ordered -Follow-up as needed   Cough Patient reports persistent cough over the past few weeks.  She has tried cough drops as well as hot tea with honey with mild/moderate relief.  She is used 2018 in the past and remembers great benefit from these. -Prescription for Lawyer sent to patient's pharmacy -Follow-up as needed  Jerilynn Som, MD [facility]

## 2020-12-10 NOTE — Patient Instructions (Signed)
It was great seeing you today.  Regarding your antidepressant medication we are going to transition you back to your Prozac.  This means you will need to take the Cymbalta 60 mg for 5 days, then decrease to 30 mg for 5 days.  You can then start taking the Prozac 20 mg for 3 days, increase to 40 mg for 3 days, and then you can increase to 60 mg daily.  I would like to see you in 1 month to follow-up on your depression.  Regarding your cough I have sent Tessalon Perles in.  Regarding your sleep concerns I have placed an order for sleep study and someone should be contacting you to schedule the study.  If you have any issues or concerns please feel free to call our clinic.  I hope you have a wonderful afternoon!

## 2020-12-12 ENCOUNTER — Other Ambulatory Visit: Payer: Self-pay | Admitting: Family Medicine

## 2020-12-16 NOTE — Procedures (Signed)
Lumbar Epidural Steroid Injection - Interlaminar Approach with Fluoroscopic Guidance  Patient: Megan Dixon      Date of Birth: 09-02-1966 MRN: [id number] PCP: Derrel Nip, MD      Visit Date: 10/24/2020   Universal Protocol:     Consent Given By: the patient  Position: PRONE  Additional Comments: Vital signs were monitored before and after the procedure. Patient was prepped and draped in the usual sterile fashion. The correct patient, procedure, and site was verified.   Injection Procedure Details:   Procedure diagnoses: Radiculopathy due to lumbar intervertebral disc disorder [M51.16]   Meds Administered:  Meds ordered this encounter  Medications  . methylPREDNISolone acetate (DEPO-MEDROL) injection 80 mg     Laterality: Left  Location/Site:  L5-S1  Needle: 4.5 in., 20 ga. Tuohy  Needle Placement: Paramedian epidural  Findings:   -Comments: Excellent flow of contrast into the epidural space.  Procedure Details: Using a paramedian approach from the side mentioned above, the region overlying the inferior lamina was localized under fluoroscopic visualization and the soft tissues overlying this structure were infiltrated with 4 ml. of 1% Lidocaine without Epinephrine. The Tuohy needle was inserted into the epidural space using a paramedian approach.   The epidural space was localized using loss of resistance along with counter oblique bi-planar fluoroscopic views.  After negative aspirate for air, blood, and CSF, a 2 ml. volume of Isovue-250 was injected into the epidural space and the flow of contrast was observed. Radiographs were obtained for documentation purposes.    The injectate was administered into the level noted above.   Additional Comments:  The patient tolerated the procedure well Dressing: 2 x 2 sterile gauze and Band-Aid    Post-procedure details: Patient was observed during the procedure. Post-procedure instructions were reviewed.  Patient  left the clinic in stable condition.

## 2020-12-16 NOTE — Progress Notes (Signed)
Megan Dixon - 55 y.o. female MRN [id number]  Date of birth: 11-07-1966  Office Visit Note: Visit Date: 10/24/2020 PCP: Gifford Shave, MD Referred by: Gifford Shave, MD  Subjective: Chief Complaint  Patient presents with  . Lower Back - Pain   HPI:  Megan Dixon is a 55 y.o. female who comes in today for planned Left L5-S1 Lumbar epidural steroid injection with fluoroscopic guidance.  The patient has failed conservative care including home exercise, medications, time and activity modification.  This injection will be diagnostic and hopefully therapeutic.  Please see requesting physician notes for further details and justification.  MRI reviewed with images and spine model.  MRI reviewed in the note below.  Consider bilateral L5-S1 facet blocks.  ROS Otherwise per HPI.  Assessment & Plan: Visit Diagnoses:    ICD-10-CM   1. Radiculopathy due to lumbar intervertebral disc disorder  M51.16 XR C-ARM NO REPORT    Epidural Steroid injection    methylPREDNISolone acetate (DEPO-MEDROL) injection 80 mg    Plan: No additional findings.   Meds & Orders:  Meds ordered this encounter  Medications  . methylPREDNISolone acetate (DEPO-MEDROL) injection 80 mg    Orders Placed This Encounter  Procedures  . XR C-ARM NO REPORT  . Epidural Steroid injection    Follow-up: Return if symptoms worsen or fail to improve.   Procedures: No procedures performed  Lumbar Epidural Steroid Injection - Interlaminar Approach with Fluoroscopic Guidance  Patient: Megan Dixon      Date of Birth: 08-Aug-1966 MRN: [id number] PCP: Gifford Shave, MD      Visit Date: 10/24/2020   Universal Protocol:     Consent Given By: the patient  Position: PRONE  Additional Comments: Vital signs were monitored before and after the procedure. Patient was prepped and draped in the usual sterile fashion. The correct patient, procedure, and site was verified.   Injection Procedure Details:    Procedure diagnoses: Radiculopathy due to lumbar intervertebral disc disorder [M51.16]   Meds Administered:  Meds ordered this encounter  Medications  . methylPREDNISolone acetate (DEPO-MEDROL) injection 80 mg     Laterality: Left  Location/Site:  L5-S1  Needle: 4.5 in., 20 ga. Tuohy  Needle Placement: Paramedian epidural  Findings:   -Comments: Excellent flow of contrast into the epidural space.  Procedure Details: Using a paramedian approach from the side mentioned above, the region overlying the inferior lamina was localized under fluoroscopic visualization and the soft tissues overlying this structure were infiltrated with 4 ml. of 1% Lidocaine without Epinephrine. The Tuohy needle was inserted into the epidural space using a paramedian approach.   The epidural space was localized using loss of resistance along with counter oblique bi-planar fluoroscopic views.  After negative aspirate for air, blood, and CSF, a 2 ml. volume of Isovue-250 was injected into the epidural space and the flow of contrast was observed. Radiographs were obtained for documentation purposes.    The injectate was administered into the level noted above.   Additional Comments:  The patient tolerated the procedure well Dressing: 2 x 2 sterile gauze and Band-Aid    Post-procedure details: Patient was observed during the procedure. Post-procedure instructions were reviewed.  Patient left the clinic in stable condition.    Clinical History: MRI LUMBAR SPINE WITHOUT CONTRAST  TECHNIQUE: Multiplanar, multisequence MR imaging of the lumbar spine was performed. No intravenous contrast was administered.  COMPARISON:  None.  FINDINGS: Segmentation:  5 lumbar type vertebral bodies assumed.  Alignment: Mild curvature convex to the  left with the apex at L2-3. Somewhat exaggerated lumbar lordosis.  Vertebrae:  No fracture or primary bone lesion.  Conus medullaris and cauda equina: Conus  extends to the L1-2 level. Conus and cauda equina appear normal.  Paraspinal and other soft tissues: Negative  Disc levels:  No abnormality at L1-2 or above.  L2-3: Mild desiccation and minimal bulging of the disc. Mild facet and ligamentous prominence. No compressive stenosis.  L3-4: Desiccation and mild bulging of the disc. Mild facet and ligamentous prominence. No compressive stenosis.  L4-5: Minimal bulging of the disc. Mild facet and ligamentous prominence. No compressive stenosis.  L5-S1: Normal appearance of the disc. Mild facet and ligamentous prominence. Facet degeneration worse on the left, which could contribute to low back pain. No compressive stenosis.  IMPRESSION: 1. No advanced finding in the lumbar region. Mild curvature convex to the left with the apex at L2-3. Somewhat exaggerated lumbar lordosis. 2. Mild degenerative disc disease and degenerative facet disease at L2-3, L3-4 and L4-5 with minimal disc bulges. No compressive stenosis of the canal or foramina. 3. 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   Electronically Signed   By: Paulina Fusi M.D.   On: 08/09/2020 15:28     Objective:  VS:  HT:    WT:   BMI:     BP:(!) 154/91  HR:(!) 103bpm  TEMP: ( )  RESP:  Physical Exam Constitutional:      General: She is not in acute distress.    Appearance: Normal appearance. She is obese. She is not ill-appearing.  HENT:     Head: Normocephalic and atraumatic.     Right Ear: External ear normal.     Left Ear: External ear normal.  Eyes:     Extraocular Movements: Extraocular movements intact.  Cardiovascular:     Rate and Rhythm: Normal rate.     Pulses: Normal pulses.  Musculoskeletal:     Right lower leg: No edema.     Left lower leg: No edema.     Comments: Patient has good distal strength with no pain over the greater trochanters.  No clonus or focal weakness.  Skin:     Findings: No erythema, lesion or rash.  Neurological:     General: No focal deficit present.     Mental Status: She is alert and oriented to person, place, and time.     Sensory: No sensory deficit.     Motor: No weakness or abnormal muscle tone.     Coordination: Coordination normal.  Psychiatric:        Mood and Affect: Mood normal.        Behavior: Behavior normal.      Imaging: No results found.

## 2020-12-19 ENCOUNTER — Telehealth: Payer: Self-pay

## 2020-12-19 NOTE — Telephone Encounter (Signed)
I called the pharmacy regarding the patient's Prozac.  They reported that she needed to be prescribed capsules because they were approved.  They reported that they would switch the prescription to capsules from tablets.

## 2020-12-19 NOTE — Telephone Encounter (Signed)
Received fax from [facility]. Fluoxetine is not covered by insurance. Would cost $800. Please send Rx for medication covered by insurance. Sunday Spillers, CMA

## 2020-12-20 MED ORDER — GABAPENTIN 300 MG PO CAPS: ORAL_CAPSULE | ORAL | 3 refills | Status: DC

## 2020-12-20 NOTE — Addendum Note (Signed)
Addended by: Concepcion Living on: 12/20/2020 02:04 PM   Modules accepted: Orders

## 2020-12-30 ENCOUNTER — Ambulatory Visit: Payer: Medicare Other | Admitting: Physical Medicine and Rehabilitation

## 2020-12-31 ENCOUNTER — Emergency Department (HOSPITAL_COMMUNITY): Payer: Medicare Other

## 2020-12-31 ENCOUNTER — Emergency Department (HOSPITAL_COMMUNITY)
Admission: EM | Admit: 2020-12-31 | Discharge: 2020-12-31 | Disposition: A | Discharge status: Home / Self Care | Payer: Medicare Other | Attending: Emergency Medicine | Admitting: Emergency Medicine

## 2020-12-31 ENCOUNTER — Other Ambulatory Visit: Payer: Self-pay

## 2020-12-31 ENCOUNTER — Encounter (HOSPITAL_COMMUNITY): Payer: Self-pay | Admitting: Emergency Medicine

## 2020-12-31 DIAGNOSIS — Z91010 Allergy to peanuts: Secondary | ICD-10-CM | POA: Insufficient documentation

## 2020-12-31 DIAGNOSIS — I110 Hypertensive heart disease with heart failure: Secondary | ICD-10-CM | POA: Diagnosis not present

## 2020-12-31 DIAGNOSIS — Z87891 Personal history of nicotine dependence: Secondary | ICD-10-CM | POA: Diagnosis not present

## 2020-12-31 DIAGNOSIS — I5032 Chronic diastolic (congestive) heart failure: Secondary | ICD-10-CM | POA: Diagnosis not present

## 2020-12-31 DIAGNOSIS — Z79899 Other long term (current) drug therapy: Secondary | ICD-10-CM | POA: Insufficient documentation

## 2020-12-31 DIAGNOSIS — R000 Tachycardia, unspecified: Secondary | ICD-10-CM | POA: Insufficient documentation

## 2020-12-31 DIAGNOSIS — E1140 Type 2 diabetes mellitus with diabetic neuropathy, unspecified: Secondary | ICD-10-CM | POA: Diagnosis not present

## 2020-12-31 DIAGNOSIS — R0602 Shortness of breath: Secondary | ICD-10-CM | POA: Diagnosis not present

## 2020-12-31 DIAGNOSIS — U071 COVID-19: Secondary | ICD-10-CM

## 2020-12-31 DIAGNOSIS — Z8673 Personal history of transient ischemic attack (TIA), and cerebral infarction without residual deficits: Secondary | ICD-10-CM | POA: Insufficient documentation

## 2020-12-31 LAB — CBC
HCT: 51.4 % — ABNORMAL HIGH (ref 36.0–46.0)
Hemoglobin: 16.3 g/dL — ABNORMAL HIGH (ref 12.0–15.0)
MCH: 28.6 pg (ref 26.0–34.0)
MCHC: 31.7 g/dL (ref 30.0–36.0)
MCV: 90.3 fL (ref 80.0–100.0)
Platelets: 264 10*3/uL (ref 150–400)
RBC: 5.69 MIL/uL — ABNORMAL HIGH (ref 3.87–5.11)
RDW: 15.2 % (ref 11.5–15.5)
WBC: 10.4 10*3/uL (ref 4.0–10.5)
nRBC: 0 % (ref 0.0–0.2)

## 2020-12-31 LAB — TROPONIN I (HIGH SENSITIVITY)
Troponin I (High Sensitivity): 18 ng/L — ABNORMAL HIGH (ref <18)
Troponin I (High Sensitivity): 14 ng/L (ref <18)

## 2020-12-31 LAB — BRAIN NATRIURETIC PEPTIDE: B Natriuretic Peptide: 39.3 pg/mL (ref 0.0–100.0)

## 2020-12-31 LAB — BASIC METABOLIC PANEL
Anion gap: 13 (ref 5–15)
BUN: 9 mg/dL (ref 6–20)
CO2: 25 mmol/L (ref 22–32)
Calcium: 9.4 mg/dL (ref 8.9–10.3)
Chloride: 101 mmol/L (ref 98–111)
Creatinine, Ser: 0.76 mg/dL (ref 0.44–1.00)
GFR, Estimated: >60 mL/min (ref >60)
Glucose, Bld: 135 mg/dL — ABNORMAL HIGH (ref 70–99)
Potassium: 3.9 mmol/L (ref 3.5–5.1)
Sodium: 139 mmol/L (ref 135–145)

## 2020-12-31 MED ORDER — BENZONATATE 100 MG PO CAPS
100.0000 mg | ORAL_CAPSULE | Freq: Once | ORAL | Status: AC
  Administered 2020-12-31: 100 mg via ORAL
  Filled 2020-12-31: qty 1

## 2020-12-31 MED ORDER — ALBUTEROL SULFATE HFA 108 (90 BASE) MCG/ACT IN AERS
2.0000 | INHALATION_SPRAY | Freq: Once | RESPIRATORY_TRACT | Status: AC
  Administered 2020-12-31: 2 via RESPIRATORY_TRACT
  Filled 2020-12-31: qty 6.7

## 2020-12-31 MED ORDER — FUROSEMIDE 20 MG PO TABS
40.0000 mg | ORAL_TABLET | Freq: Once | ORAL | Status: AC
  Administered 2020-12-31: 40 mg via ORAL
  Filled 2020-12-31: qty 2

## 2020-12-31 MED ORDER — ALBUTEROL SULFATE HFA 108 (90 BASE) MCG/ACT IN AERS: 1.0000 | INHALATION_SPRAY | Freq: Four times a day (QID) | RESPIRATORY_TRACT | 2 refills | Status: DC | PRN

## 2020-12-31 MED ORDER — FUROSEMIDE 40 MG PO TABS: 40.0000 mg | ORAL_TABLET | Freq: Every day | ORAL | 1 refills | Status: DC

## 2020-12-31 NOTE — Discharge Instructions (Addendum)
Keep drinking plenty of fluid, and continue taking your Lasix in the morning to help you pee out the fluid from your lungs.  Follow-up with your primary care doctor scheduled at the end of the month.  You should buy a pulse oximeter online or at a pharmacy to keep track of your oxygen levels.  If your level drops under 88% while at rest, or you feel like you are having severe difficulty breathing, please call 911 or return immediately to the ER.  You should quarantine for 10 days from the onset of your symptoms.

## 2020-12-31 NOTE — ED Triage Notes (Signed)
Pt had a positive covid  Test yesterday

## 2020-12-31 NOTE — ED Triage Notes (Signed)
Pt here from home with c/o sob and chest pressure that started around 0445 this morning along with some slight nausea , pt does have chf

## 2020-12-31 NOTE — ED Provider Notes (Signed)
[facility] EMERGENCY DEPARTMENT Provider Note   CSN: [id number] Arrival date & time: 12/31/20  0709     History CC:  Cough, fever, fatigue  Megan Dixon is a 55 y.o. female w/ hx of CHF EF 50-55% in 2020 echo, fibromyalgia, on lasix daily, presenting to ED with sob, fatigue.  She reports she tested POSITIVE for covid yesterday.  She's been feeling unwell for about 7 days.  She has cough, myalgia, fevers, chills, poor appetite, fatigue.  She got 2 covid vaccines pzifer but no booster.   She presents today with worsening weakness, coughing, chest tightness.  Ran out of lasix 2 days ago.  Reports she's taking all her other medications.  HPI     Past Medical History:  Diagnosis Date  . Arthritis   . Depression   . Fibromyalgia   . Hypertension   . Lupus ([place])    a. was told bloodwork was possibly positive for lupus in 08/2018 by PCP.  Marland Kitchen Morbid obesity ([place])   . MS (multiple sclerosis) ([place])    a. presumptive dx in [place]  . OCD (obsessive compulsive disorder)   . OSA (obstructive sleep apnea)    a. dx recently, pending CPAP initiation.  . Palpitations   . Pneumonia 07/2018   left lung    Patient Active Problem List   Diagnosis Date Noted  . OSA (obstructive sleep apnea)   . Palpitation 09/10/2020  . Chronic diastolic heart failure ([place]) 09/10/2020  . History of CVA (cerebrovascular accident) 09/10/2020  . Congestive heart failure ([place]) 08/08/2020  . Lower extremity edema 08/08/2020  . Lower extremity pain, posterior, left 01/29/2020  . Hyperlipidemia 01/09/2020  . Depression 01/01/2020  . Bilateral lower extremity pain 01/01/2020  . Peripheral neuropathy 11/23/2019  . Prediabetes 11/23/2019  . Axillary lump, right 11/23/2019  . Acute vestibular syndrome 08/29/2019  . Ischemic vascular disease 08/29/2019  . Migraine 08/29/2019  . Morbid obesity ([place])   . Family history of systemic lupus erythematosus 09/10/2018  . Anxiety disorder  09/07/2018  . Hypertension 08/01/2018  . Fibromyalgia 08/01/2018  . Blood-tinged sputum 08/01/2018  . Chronic low back pain 08/16/2017  . Gastroesophageal reflux disease 08/16/2017  . Incarcerated incisional hernia 08/10/2017  . S/P exploratory laparotomy 08/09/2017    Past Surgical History:  Procedure Laterality Date  . ABDOMINAL SURGERY    . BREAST EXCISIONAL BIOPSY Right   . CARPAL TUNNEL RELEASE    . HERNIA REPAIR    . JOINT REPLACEMENT    . VENTRAL HERNIA REPAIR N/A 08/09/2017   Procedure: Incarcerated ventral hernia repair with mesh ;  Surgeon: Rolm Bookbinder, MD;  Location: [facility];  Service: General;  Laterality: N/A;     OB History   No obstetric history on file.     Family History  Problem Relation Age of Onset  . Lung cancer Mother   . Seizures Brother   . Heart attack Brother        Happened during seizure  . Breast cancer Other     Social History   Tobacco Use  . Smoking status: Former Smoker    Types: Cigarettes  . Smokeless tobacco: Never Used  . Tobacco comment: Smoked for 40 years, quit in 08/2018  Vaping Use  . Vaping Use: Never used  Substance Use Topics  . Alcohol use: Not Currently  . Drug use: Never    Home Medications Prior to Admission medications   Medication Sig Start Date End Date Taking?  Authorizing Provider  albuterol (VENTOLIN HFA) 108 (90 Base) MCG/ACT inhaler Inhale 1-2 puffs into the lungs every 6 (six) hours as needed for wheezing or shortness of breath. 12/31/20  Yes Likisha Alles, Carola Rhine, MD  amLODipine (NORVASC) 5 MG tablet TAKE 1 TABLET BY MOUTH EVERY DAY Patient taking differently: Take 5 mg by mouth daily. 11/20/20  Yes Gifford Shave, MD  atorvastatin (LIPITOR) 40 MG tablet TAKE 1 TABLET BY MOUTH EVERY DAY Patient taking differently: Take 40 mg by mouth daily. 09/20/20  Yes Gifford Shave, MD  carvedilol (COREG) 6.25 MG tablet TAKE 1 TABLET (6.25 MG TOTAL) BY MOUTH 2 (TWO) TIMES DAILY WITH A MEAL. 11/20/20  Yes Gifford Shave, MD  clopidogrel (PLAVIX) 75 MG tablet TAKE 1 TABLET BY MOUTH EVERY DAY Patient taking differently: Take 75 mg by mouth daily. 12/10/20  Yes Gifford Shave, MD  cyclobenzaprine (FLEXERIL) 10 MG tablet TAKE 1 TABLET BY MOUTH THREE TIMES A DAY AS NEEDED Patient taking differently: Take 10 mg by mouth 3 (three) times daily as needed for muscle spasms. 12/12/20  Yes Gifford Shave, MD  Elastic Bandages & Supports (MEDICAL COMPRESSION STOCKINGS) [place] 1 Container by Does not apply route daily. Apply to both legs daily 01/01/20  Yes Gifford Shave, MD  FLUoxetine (PROZAC) 20 MG tablet Take 3 tablets (60 mg total) by mouth daily. 12/10/20  Yes Gifford Shave, MD  furosemide (LASIX) 20 MG tablet TAKE 1 TABLET BY MOUTH EVERY DAY IN THE MORNING Patient taking differently: Take 20 mg by mouth every morning. 11/20/20  Yes Gifford Shave, MD  furosemide (LASIX) 40 MG tablet Take 1 tablet (40 mg total) by mouth daily. 12/31/20 01/30/21 Yes Jalin Alicea, Carola Rhine, MD  gabapentin (NEURONTIN) 300 MG capsule Take 600 mg in the morning and at lunch and 900 mg at night. Patient taking differently: Take 600-900 mg by mouth 3 (three) times daily. Take two capsules (600mg ) every morning and at lunch, then take 3 capsules (900mg ) every night at bedtime. 12/20/20  Yes Gifford Shave, MD  losartan (COZAAR) 50 MG tablet TAKE 1 TABLET BY MOUTH EVERY DAY Patient taking differently: Take 50 mg by mouth daily. 06/20/20  Yes Gifford Shave, MD  pantoprazole (PROTONIX) 40 MG tablet TAKE 1 TABLET BY MOUTH EVERY DAY Patient taking differently: Take 40 mg by mouth daily. 11/11/20  Yes Gifford Shave, MD  benzonatate (TESSALON) 100 MG capsule Take 1 capsule (100 mg total) by mouth 2 (two) times daily as needed for cough. Patient not taking: No sig reported 12/10/20   Gifford Shave, MD  meclizine (ANTIVERT) 25 MG tablet Take 1 tablet (25 mg total) by mouth 3 (three) times daily as needed for dizziness. Patient not taking:  No sig reported 03/05/20   Wilber Oliphant, MD  ondansetron (ZOFRAN ODT) 4 MG disintegrating tablet Take 1 tablet (4 mg total) by mouth every 8 (eight) hours as needed for nausea or vomiting. Patient not taking: No sig reported 09/02/19   Kathi Ludwig, MD    Allergies    Morphine and related, Other, Peanut-containing drug products, and Toradol [ketorolac tromethamine]  Review of Systems   Review of Systems  Constitutional: Positive for appetite change, chills, fatigue and fever.  HENT: Positive for congestion. Negative for sore throat.   Eyes: Negative for pain and visual disturbance.  Respiratory: Positive for cough and shortness of breath.   Cardiovascular: Positive for chest pain. Negative for palpitations.  Gastrointestinal: Positive for nausea. Negative for abdominal pain and vomiting.  Genitourinary: Negative for  dysuria and hematuria.  Musculoskeletal: Positive for arthralgias and myalgias.  Skin: Negative for pallor and rash.  Neurological: Positive for headaches. Negative for syncope.  All other systems reviewed and are negative.   Physical Exam Updated Vital Signs BP (!) 185/98 (BP Location: Right Arm)   Pulse 94   Temp 98 F (36.7 C) (Oral)   Resp 16   Ht 5\' 1"  (1.549 m)   Wt 109.8 kg   SpO2 98%   BMI 45.73 kg/m   Physical Exam Constitutional:      General: She is not in acute distress.    Appearance: She is obese.  HENT:     Head: Normocephalic and atraumatic.  Eyes:     Conjunctiva/sclera: Conjunctivae normal.     Pupils: Pupils are equal, round, and reactive to light.  Cardiovascular:     Rate and Rhythm: Regular rhythm. Tachycardia present.     Comments: HR 105 Pulmonary:     Effort: Pulmonary effort is normal.     Comments: Faint expiratory wheezing Dry cough Abdominal:     General: There is no distension.     Tenderness: There is no abdominal tenderness.  Skin:    General: Skin is warm and dry.  Neurological:     General: No focal deficit  present.     Mental Status: She is alert. Mental status is at baseline.  Psychiatric:        Mood and Affect: Mood normal.        Behavior: Behavior normal.     ED Results / Procedures / Treatments   Labs (all labs ordered are listed, but only abnormal results are displayed) Labs Reviewed  BASIC METABOLIC PANEL - Abnormal; Notable for the following components:      Result Value   Glucose, Bld 135 (*)    All other components within normal limits  CBC - Abnormal; Notable for the following components:   RBC 5.69 (*)    Hemoglobin 16.3 (*)    HCT 51.4 (*)    All other components within normal limits  TROPONIN I (HIGH SENSITIVITY) - Abnormal; Notable for the following components:   Troponin I (High Sensitivity) 18 (*)    All other components within normal limits  BRAIN NATRIURETIC PEPTIDE  TROPONIN I (HIGH SENSITIVITY)    EKG EKG Interpretation  Date/Time:  Tuesday December 31 2020 07:15:08 EST Ventricular Rate:  122 PR Interval:  138 QRS Duration: 76 QT Interval:  346 QTC Calculation: 493 R Axis:   130 Text Interpretation: Sinus tachycardia with occasional Premature ventricular complexes Right axis deviation Abnormal ECG No STEMI Confirmed by Octaviano Glow [phone]) on 12/31/2020 9:38:52 AM   Radiology DG Chest Portable 1 View  Result Date: 12/31/2020 CLINICAL DATA:  Palpitations/chest pain/shortness of breath for a week. COVID positive. EXAM: PORTABLE CHEST 1 VIEW COMPARISON:  08/29/2019 FINDINGS: Enlargement of the cardiac silhouette, similar to prior. Both lungs are clear. No visible pleural effusions or pneumothorax. No acute osseous abnormality. IMPRESSION: No evidence of acute cardiopulmonary disease. Electronically Signed   By: Margaretha Sheffield MD   On: 12/31/2020 08:09    Procedures Procedures (including critical care time)  Medications Ordered in ED Medications  benzonatate (TESSALON) capsule 100 mg (100 mg Oral Given 12/31/20 1018)  furosemide (LASIX) tablet  40 mg (40 mg Oral Given 12/31/20 1018)  albuterol (VENTOLIN HFA) 108 (90 Base) MCG/ACT inhaler 2 puff (2 puffs Inhalation Given 12/31/20 1018)    ED Course  I have reviewed the triage  vital signs and the nursing notes.  Pertinent labs & imaging results that were available during my care of the patient were reviewed by me and considered in my medical decision making (see chart for details).  55 yo female here on approx day 7 of covid symptoms, with persistent viral syndrome  No hypoxia on exam.  Breathing comfortably on room air.  We'll try albuterol for wheezing, tessalon, and also restart her lasix.  She's missed 2 days. Airborne precautions  Initial trop 18 - 14 on repeat, flat.  BNP 39 and normal. BMP and CBC unremarkable. Xray reviewed with no evident focal disease   Doubt bacterial PNA or sepsis ECG with sinus tachycardia, no STEMI  Megan Dixon was evaluated in Emergency Department on 12/31/2020 for the symptoms described in the history of present illness. She was evaluated in the context of the global COVID-19 pandemic, which necessitated consideration that the patient might be at risk for infection with the SARS-CoV-2 virus that causes COVID-19. Institutional protocols and algorithms that pertain to the evaluation of patients at risk for COVID-19 are in a state of rapid change based on information released by regulatory bodies including the CDC and federal and state organizations. These policies and algorithms were followed during the patient's care in the ED.   Clinical Course as of 12/31/20 1643  Tue Dec 31, 2020  1022 Feels better with albuterol, can prescribe this as well [MT]  1035 BNP unremarkable [MT]  1220 Vitals stable, repeat trop flat, okay for discharge [MT]    Clinical Course User Index [MT] Shanelle Clontz, Carola Rhine, MD    Final Clinical Impression(s) / ED Diagnoses Final diagnoses:  [id number]    Rx / DC Orders ED Discharge Orders         Ordered    furosemide  (LASIX) 40 MG tablet  Daily        12/31/20 1251    albuterol (VENTOLIN HFA) 108 (90 Base) MCG/ACT inhaler  Every 6 hours PRN        12/31/20 1252           Wyvonnia Dusky, MD 12/31/20 1643

## 2021-01-01 ENCOUNTER — Telehealth: Payer: Self-pay | Admitting: Nurse Practitioner

## 2021-01-01 ENCOUNTER — Telehealth: Payer: Self-pay | Admitting: Physical Medicine and Rehabilitation

## 2021-01-01 ENCOUNTER — Ambulatory Visit: Payer: Medicare Other | Admitting: Physical Medicine and Rehabilitation

## 2021-01-01 NOTE — Telephone Encounter (Signed)
Called and r/s 

## 2021-01-01 NOTE — Telephone Encounter (Signed)
[facility] ([phone]) called pt for HFU. LVM for return call to schedule HFU appt. 

## 2021-01-01 NOTE — Telephone Encounter (Signed)
Pt called stating she has covid and will need to get her appt rescheduled  [phone]

## 2021-01-03 ENCOUNTER — Other Ambulatory Visit: Payer: Self-pay

## 2021-01-03 MED ORDER — MECLIZINE HCL 25 MG PO TABS: 25.0000 mg | ORAL_TABLET | Freq: Three times a day (TID) | ORAL | 0 refills | Status: DC | PRN

## 2021-01-10 ENCOUNTER — Ambulatory Visit: Payer: Medicare Other | Admitting: Family Medicine

## 2021-01-24 ENCOUNTER — Ambulatory Visit (INDEPENDENT_AMBULATORY_CARE_PROVIDER_SITE_OTHER): Payer: Medicare Other | Admitting: Family Medicine

## 2021-01-24 ENCOUNTER — Encounter: Payer: Self-pay | Admitting: Family Medicine

## 2021-01-24 ENCOUNTER — Ambulatory Visit
Admission: RE | Admit: 2021-01-24 | Discharge: 2021-01-24 | Disposition: A | Discharge status: Home / Self Care | Payer: Medicare Other | Source: Ambulatory Visit | Attending: Family Medicine | Admitting: Family Medicine

## 2021-01-24 ENCOUNTER — Other Ambulatory Visit: Payer: Self-pay

## 2021-01-24 VITALS — BP 132/78 | HR 92 | Ht 61.0 in | Wt 303.4 lb

## 2021-01-24 DIAGNOSIS — M25531 Pain in right wrist: Secondary | ICD-10-CM

## 2021-01-24 DIAGNOSIS — F32A Depression, unspecified: Secondary | ICD-10-CM

## 2021-01-24 MED ORDER — PANTOPRAZOLE SODIUM 40 MG PO TBEC: 40.0000 mg | DELAYED_RELEASE_TABLET | Freq: Every day | ORAL | 3 refills | Status: DC

## 2021-01-24 MED ORDER — CYCLOBENZAPRINE HCL 10 MG PO TABS
10.0000 mg | ORAL_TABLET | Freq: Three times a day (TID) | ORAL | 1 refills | Status: DC | PRN
Start: 2021-01-24 — End: 2021-11-18

## 2021-01-24 NOTE — Patient Instructions (Addendum)
It was a pleasure seeing you today.  Congratulations on the weight loss!!  I am sorry he fell and hurt her wrist.  I have put an order for an x-ray and you need to go to [facility] imaging to have that done today.  You can just walk in and have the x-ray completed.  Regarding your depression I am glad that the Prozac is doing its job and I am happy that you are living with your son now.  The family support and time with her grandson are definitely going to help.  If you have any questions or concerns please feel free to call the clinic.  I hope you have a wonderful afternoon!   Wrist Pain, Adult There are many things that can cause wrist pain. Some common causes include:  An injury to the wrist.  Using the joint too much.  A condition that causes too much pressure to be put on a nerve in the wrist (carpal tunnel syndrome).  Wear and tear of the joints that happens as a person gets older (osteoarthritis).  A condition that causes swelling and stiffness in the joints (arthritis). Sometimes, the cause of wrist pain is not known. Often, the pain goes away when you follow your doctor's instructions for easing pain at home. This may include resting your wrist, icing your wrist, or using a splint or an elastic wrap for a short time. It is important to tell your doctor if your wrist pain does not go away. Follow these instructions at home: If you have a splint or elastic wrap:  Wear the splint or wrap as told by your doctor. Take it off only as told by your doctor. Ask if you can take it off for bathing.  Loosen the splint or wrap if your fingers: ? Tingle. ? Become numb. ? Turn cold and blue.  Check the skin around the splint or wrap every day. Tell your doctor about any concerns.  Keep the splint or wrap clean.  If the splint or wrap is not waterproof: ? Do not let it get wet. ? Cover it with a watertight covering when you take a bath or shower. Managing pain, stiffness, and  swelling  If told, put ice on the painful area. To do this: ? If you have a removable splint or wrap, take it off as told by your doctor. ? Put ice in a plastic bag. ? Place a towel between your skin and the bag or between your splint or wrap and the bag. ? Leave the ice on for 20 minutes, 2-3 times a day.  Move your fingers often.  Raise (elevate) the injured area above the level of your heart while you are sitting or lying down.   Activity  Rest your wrist as told by your doctor.  Return to your normal activities as told by your doctor. Ask your doctor what activities are safe for you.  Ask your doctor when it is safe to drive if you have a splint or wrap on your wrist.  Do exercises as told by your doctor. General instructions  Pay attention to any changes in your symptoms.  Take over-the-counter and prescription medicines only as told by your doctor.  Keep all follow-up visits as told by your doctor. This is important. Contact a doctor if:  You have a sudden, sharp pain in the wrist, hand, or arm that is different or new.  The swelling or bruising on your wrist or hand gets worse.  Your skin: ? Becomes red. ? Gets a rash. ? Has open sores.  Your pain does not get better.  Your pain gets worse.  You have a fever or chills. Get help right away if:  You lose feeling in your fingers or hand.  Your fingers turn white, very red, or cold and blue.  You cannot move your fingers. Summary  There are many things that can cause wrist pain.  It is important to tell your doctor if your wrist pain does not go away.  You may need to wear a splint or a wrap for a short period of time.  Return to your normal activities as told by your doctor. Ask your doctor what activities are safe for you. This information is not intended to replace advice given to you by your health care provider. Make sure you discuss any questions you have with your health care provider. Document  Revised: 10/19/2019 Document Reviewed: 10/19/2019 Elsevier Patient Education  2021 [employer].

## 2021-01-24 NOTE — Progress Notes (Signed)
    SUBJECTIVE:   CHIEF COMPLAINT / HPI:   Right wrist pain  Patient reports she was leaving her house to come to the visit and she fell.  She landed on her left knee and right wrist.  She reports that since the fall her right wrist has begun to swell and she has had to take her ring off of her finger on her right hand because the swelling was so severe.  She rates the pain a 9 out of 10.  Pain with flexion, extension, palpation.  Denies hearing any pop.  Has not taken any medications for this because it just occurred.  Depression follow-up  Patient reports that her depression is considerably better than it was at her previous visit.  She has restarted her Prozac and she is also moved in with her son so she gets to see her grandson on a regular basis.  She goes for walks with her grandson regularly.  She has lost weight since her previous visit and feels like she is eating better.  Denies SI or HI  OBJECTIVE:   BP 132/78   Pulse 92   Ht 5\' 1"  (1.549 m)   Wt (!) 303 lb 6.4 oz (137.6 kg)   SpO2 99%   BMI 57.33 kg/m   General: Well-appearing obese 55 year old female in no acute distress Cardiac: Regular rate and rhythm, no murmurs pressure Respiratory: Normal breathing, lungs clear to auscultation bilaterally MSK: Mild tenderness to left knee but no effusion, swelling noted severe tenderness to right wrist with swelling in her right hand as well as right distal arm.  Full range of motion with considerable pain.  No gross deformities.  PHQ9 SCORE ONLY 01/24/2021 12/10/2020 09/30/2020  PHQ-9 Total Score 0 10 12     ASSESSMENT/PLAN:   Right wrist pain Right wrist pain 2/2 a fall just prior to coming to this appointment.  Swelling and extreme tenderness to palpation noted.  Full range of motion.  Physical exam is concerning for fracture.  Complete wrist x-ray ordered and patient will go to [facility] imaging to have imaging completed.  If fracture is identified she will need to go to  orthopedic urgent care for splinting.  General RICE management discussed with the patient and she is given a handout.  Depression Patient's depression is doing wonderful at this time.  She reports she feels happy and healthy.  Currently on Prozac 60 mg daily.  PHQ-9 today is 0 from 10 at the previous visit. -Continue current regimen -Follow-up as needed -Strict return precautions given     Gifford Shave, MD [place]

## 2021-01-24 NOTE — Assessment & Plan Note (Signed)
Right wrist pain 2/2 a fall just prior to coming to this appointment.  Swelling and extreme tenderness to palpation noted.  Full range of motion.  Physical exam is concerning for fracture.  Complete wrist x-ray ordered and patient will go to [facility] imaging to have imaging completed.  If fracture is identified she will need to go to orthopedic urgent care for splinting.  General RICE management discussed with the patient and she is given a handout.

## 2021-01-24 NOTE — Assessment & Plan Note (Signed)
Patient's depression is doing wonderful at this time.  She reports she feels happy and healthy.  Currently on Prozac 60 mg daily.  PHQ-9 today is 0 from 10 at the previous visit. -Continue current regimen -Follow-up as needed -Strict return precautions given

## 2021-01-30 ENCOUNTER — Ambulatory Visit: Payer: Medicare Other | Admitting: Physical Medicine and Rehabilitation

## 2021-02-01 ENCOUNTER — Other Ambulatory Visit: Payer: Self-pay | Admitting: Family Medicine

## 2021-02-05 ENCOUNTER — Ambulatory Visit: Payer: Medicare Other | Admitting: Family Medicine

## 2021-02-17 ENCOUNTER — Ambulatory Visit: Payer: Medicare Other | Admitting: Physical Medicine and Rehabilitation

## 2021-02-21 ENCOUNTER — Telehealth: Payer: Self-pay | Admitting: Physical Medicine and Rehabilitation

## 2021-02-21 NOTE — Telephone Encounter (Signed)
Patient called requesting a call back to set an appt. Please call patient at [phone].

## 2021-02-21 NOTE — Telephone Encounter (Signed)
Rescheduled

## 2021-02-25 ENCOUNTER — Ambulatory Visit (INDEPENDENT_AMBULATORY_CARE_PROVIDER_SITE_OTHER): Payer: Medicare Other | Admitting: Cardiology

## 2021-02-25 ENCOUNTER — Encounter: Payer: Self-pay | Admitting: Cardiology

## 2021-02-25 VITALS — BP 132/86 | HR 106 | Ht 61.0 in | Wt 304.0 lb

## 2021-02-25 DIAGNOSIS — Z8673 Personal history of transient ischemic attack (TIA), and cerebral infarction without residual deficits: Secondary | ICD-10-CM

## 2021-02-25 DIAGNOSIS — I5032 Chronic diastolic (congestive) heart failure: Secondary | ICD-10-CM | POA: Diagnosis not present

## 2021-02-25 DIAGNOSIS — I509 Heart failure, unspecified: Secondary | ICD-10-CM

## 2021-02-25 DIAGNOSIS — I10 Essential (primary) hypertension: Secondary | ICD-10-CM | POA: Diagnosis not present

## 2021-02-25 DIAGNOSIS — R002 Palpitations: Secondary | ICD-10-CM | POA: Diagnosis not present

## 2021-02-25 DIAGNOSIS — Z7189 Other specified counseling: Secondary | ICD-10-CM | POA: Diagnosis not present

## 2021-02-25 NOTE — Patient Instructions (Signed)

## 2021-02-25 NOTE — Progress Notes (Signed)
Cardiology Office Note:    Date:  02/25/2021   ID:  Megan Dixon, DOB 21-Sep-1966, MRN [id number]  PCP:  Megan Nip, MD  Cardiologist:  Megan Red, MD PhD  Referring MD: Dr. Chrissie Noa Dixon/Dr. Orpah Dixon (family medicine)  CC: follow up  History of Present Illness:    Megan Dixon is a 55 y.o. female with a hx of hypertension, obesity, vertigo, prior cerebellar infarction who is seen for follow up today.   I initially met her 09/22/2018 in the hospital  when she was admitted for dizziness and intermittent chest pain. Her blood pressure was also poorly controlled. She was treated for central vertigo and BPPV, and her blood pressure was optimized by weaning clonidine and starting/titrating amlodipine, carvedilol, and losartan. She was also recommended to treat her OSA. She had occasional flutters during her admission, without clear arrhythmia. Recommendation was for monitor placement for further evaluation. She was also started on clopidogrel and atorvastatin for evidence of prior small CVA on imaging.  Today: Rare palpitations, reviewed monitor results today. Feels most commonly at night, hears her heart beat, gets up and sits up.   Had Covid, didn't need to be hospitalized. Had horrible headaches, now resolved. Still has leg fatigue/residual aches from Covid.   ROS positive for poor sleep, about 4 hours/night on average. Sometimes feels short of breath, rarely. Sleeps on 1 pillow. Snores. Ordered for sleep study, had to be held off due to Covid, hasn't received call back to reschedule.   Denies chest pain, shortness of breath at rest or with normal exertion. No orthopnea, LE edema or unexpected weight gain. No syncope.  Weight was 315 lbs 12/10/20, down to 303 lbs. Weight from ER visit was 242 lbs, inaccurate.  Remains off smoking, congratulated.    Past Medical History:  Diagnosis Date  . Arthritis   . Depression   . Fibromyalgia   . Hypertension   .  Lupus (HCC)    a. was told bloodwork was possibly positive for lupus in 08/2018 by PCP.  Megan Dixon Morbid obesity (HCC)   . MS (multiple sclerosis) (HCC)    a. presumptive dx in [place], [place]  . OCD (obsessive compulsive disorder)   . OSA (obstructive sleep apnea)    a. dx recently, pending CPAP initiation.  . Palpitations   . Pneumonia 07/2018   left lung    Past Surgical History:  Procedure Laterality Date  . ABDOMINAL SURGERY    . BREAST EXCISIONAL BIOPSY Right   . CARPAL TUNNEL RELEASE    . HERNIA REPAIR    . JOINT REPLACEMENT    . VENTRAL HERNIA REPAIR N/A 08/09/2017   Procedure: Incarcerated ventral hernia repair with mesh ;  Surgeon: Emelia Loron, MD;  Location: [facility] OR;  Service: General;  Laterality: N/A;    Current Medications: Current Outpatient Medications on File Prior to Visit  Medication Sig  . albuterol (VENTOLIN HFA) 108 (90 Base) MCG/ACT inhaler Inhale 1-2 puffs into the lungs every 6 (six) hours as needed for wheezing or shortness of breath.  Megan Dixon amLODipine (NORVASC) 5 MG tablet TAKE 1 TABLET BY MOUTH EVERY DAY (Patient taking differently: Take 5 mg by mouth daily.)  . atorvastatin (LIPITOR) 40 MG tablet TAKE 1 TABLET BY MOUTH EVERY DAY (Patient taking differently: Take 40 mg by mouth daily.)  . benzonatate (TESSALON) 100 MG capsule Take 1 capsule (100 mg total) by mouth 2 (two) times daily as needed for cough.  . carvedilol (COREG) 6.25 MG tablet TAKE 1  TABLET (6.25 MG TOTAL) BY MOUTH 2 (TWO) TIMES DAILY WITH A MEAL.  Megan Dixon clopidogrel (PLAVIX) 75 MG tablet TAKE 1 TABLET BY MOUTH EVERY DAY (Patient taking differently: Take 75 mg by mouth daily.)  . cyclobenzaprine (FLEXERIL) 10 MG tablet Take 1 tablet (10 mg total) by mouth 3 (three) times daily as needed for muscle spasms.  Megan Dixon Bandages & Supports (MEDICAL COMPRESSION STOCKINGS) MISC 1 Container by Does not apply route daily. Apply to both legs daily  . FLUoxetine (PROZAC) 20 MG tablet Take 3 tablets (60 mg total)  by mouth daily.  . furosemide (LASIX) 20 MG tablet TAKE 1 TABLET BY MOUTH EVERY DAY IN THE MORNING (Patient taking differently: Take 20 mg by mouth every morning.)  . furosemide (LASIX) 40 MG tablet TAKE 1 TABLET BY MOUTH EVERY DAY  . gabapentin (NEURONTIN) 300 MG capsule Take 600 mg in the morning and at lunch and 900 mg at night. (Patient taking differently: Take 600-900 mg by mouth 3 (three) times daily. Take two capsules (610m) every morning and at lunch, then take 3 capsules (9064m every night at bedtime.)  . losartan (COZAAR) 50 MG tablet TAKE 1 TABLET BY MOUTH EVERY DAY (Patient taking differently: Take 50 mg by mouth daily.)  . meclizine (ANTIVERT) 25 MG tablet Take 1 tablet (25 mg total) by mouth 3 (three) times daily as needed for dizziness.  . ondansetron (ZOFRAN ODT) 4 MG disintegrating tablet Take 1 tablet (4 mg total) by mouth every 8 (eight) hours as needed for nausea or vomiting.  . pantoprazole (PROTONIX) 40 MG tablet Take 1 tablet (40 mg total) by mouth daily.   No current facility-administered medications on file prior to visit.     Allergies:   Morphine and related, Other, Peanut-containing drug products, and Toradol [ketorolac tromethamine]   Social History   Tobacco Use  . Smoking status: Former Smoker    Types: Cigarettes  . Smokeless tobacco: Never Used  . Tobacco comment: Smoked for 40 years, quit in 08/2018  Vaping Use  . Vaping Use: Never used  Substance Use Topics  . Alcohol use: Not Currently  . Drug use: Never    Family History: The patient's family history includes Breast cancer in an other family member; Heart attack in her brother; Lung cancer in her mother; Seizures in her brother.  ROS:   Please see the history of present illness.  Additional ROS otherwise unremarkable.   EKGs/Labs/Other Studies Reviewed:    The following studies were reviewed today: Monitor 11/11/20  4 days of data recorded on Zio monitor. Patient had a min HR of 48 bpm, max  HR of 138 bpm, and avg HR of 102 bpm. Predominant underlying rhythm was Sinus Rhythm. No VT, atrial fibrillation, high degree block, or pauses noted. 6 Supraventricular Tachycardia runs noted, but at least one of these events was actually sinus tachycardia. Events were brief and self limited. Isolated atrial ectopy was rare (<1%), and isolate ventricular ectopy was occasional (1.3%). There were 2 triggered events, which were sinus with ectopy. No high risk arrhythmias detected.  Echo 08/09/20 1. Left ventricular ejection fraction, by estimation, is 50 to 55%. The  left ventricle has low normal function. The left ventricle has no regional  wall motion abnormalities. Left ventricular diastolic parameters are  consistent with Grade I diastolic  dysfunction (impaired relaxation).  2. Right ventricular systolic function is normal. The right ventricular  size is normal.  3. The mitral valve is normal in structure. No  evidence of mitral valve  regurgitation. No evidence of mitral stenosis.  4. The aortic valve is normal in structure. Aortic valve regurgitation is  not visualized. No aortic stenosis is present.  5. The inferior vena cava is normal in size with greater than 50%  respiratory variability, suggesting right atrial pressure of 3 mmHg.   Monitor 11/15/2018 8 days of available data on Zio monitor. Patient had a min HR of 69 bpm, max HR of 136 bpm, and avg HR of 102 bpm. Predominant underlying rhythm was Sinus Rhythm. 1 run of Ventricular Tachycardia occurred lasting 4 beats with a max rate of 120 bpm (avg 111 bpm). Isolated SVEs were rare (<1.0%), and no SVE Couplets or SVE Triplets were present. Isolated VEs were rare (<1.0%), VE Couplets were rare (<1.0%), and no VE Triplets were present. Ventricular Bigeminy was present, duration 3.6 seconds. 4 triggered events, all sinus rhythm or sinus with ventricular ectopy. No atrial fibrillation, pauses, or high degree AV block.   Echo  09/20/18 Study Conclusions - Left ventricle: The cavity size was mildly dilated. Wall   thickness was increased in a pattern of mild LVH. Systolic   function was mildly reduced. The estimated ejection fraction was   in the range of 45% to 50%. Diffuse hypokinesis. Doppler   parameters are consistent with abnormal left ventricular   relaxation (grade 1 diastolic dysfunction).  Impressions: - Mild global reduction in LV systolic function (EF 50); mild LVE;   mild LVH; mild diastolic dysfunction.  EKG:  EKG is ordered today.  The ekg ordered today demonstrates sinus tachycardia at 106 bpm.  Recent Labs: 08/06/2020: ALT 12 12/31/2020: B Natriuretic Peptide 39.3; BUN 9; Creatinine, Ser 0.76; Hemoglobin 16.3; Platelets 264; Potassium 3.9; Sodium 139  Recent Lipid Panel    Component Value Date/Time   CHOL 118 09/30/2020 1434   TRIG 128 09/30/2020 1434   HDL 30 (L) 09/30/2020 1434   CHOLHDL 3.9 09/30/2020 1434   CHOLHDL 5.4 09/18/2018 1850   VLDL 26 09/18/2018 1850   LDLCALC 65 09/30/2020 1434    Physical Exam:    VS:  BP 132/86   Pulse (!) 106   Ht _0  (1.549 m)   Wt (!) 304 lb (137.9 kg)   SpO2 95%   BMI 57.44 kg/m     Wt Readings from Last 3 Encounters:  02/25/21 (!) 304 lb (137.9 kg)  01/24/21 (!) 303 lb 6.4 oz (137.6 kg)  12/31/20 242 lb (109.8 kg)    GEN: Well nourished, well developed in no acute distress HEENT: Normal, moist mucous membranes NECK: No JVD CARDIAC: regular rhythm, normal S1 and S2, no rubs or gallops. No murmur. VASCULAR: Radial and DP pulses 2+ bilaterally. No carotid bruits RESPIRATORY:  Clear to auscultation without rales, wheezing or rhonchi  ABDOMEN: Soft, non-tender, non-distended MUSCULOSKELETAL:  Ambulates independently SKIN: Warm and dry, trivial bilateral LE edema NEUROLOGIC:  Alert and oriented x 3. No focal neuro deficits noted. PSYCHIATRIC:  Normal affect   ASSESSMENT:    1. Palpitation   2. Chronic diastolic heart failure  ([place])   3. Morbid obesity ([place])   4. History of CVA (cerebrovascular accident)   5. Essential hypertension   6. Counseling on health promotion and disease prevention   7. Congestive heart failure, unspecified HF chronicity, unspecified heart failure type ([place])    PLAN:    Hypertension: near goal today -continue amlodipine 5 mg, carvedilol 6.25 mg BID, losartan 50 mg daily -counseled that with continued weight  loss, BP should improve.  Chronic diastolic heart failure, chronic LE edema -continue furosemide daily, as this has improved her swelling significantly -grade 1 diastolic dysfunction on echo  Palpitations: unclear etiology. -monitor did not show significant arrhythmias linked to her symptoms -reviewed other possible etiologies of palpitations beyond arrhythmia -counseled on Dixon flag warning signs that need immediate medical attention  Morbid obesity: BMI 57 -she is working on lifestyle changes and has lost some weight, congratulated -she will continue to work on this, offered support  OSA: recommended CPAP use  History of CVA: -on atorvastatin 40 mg daily -on clopidogrel daily  CV risk counseling and prevention: -recommend heart healthy/Mediterranean diet, with whole grains, fruits, vegetable, fish, lean meats, nuts, and olive oil. Limit salt. -recommend moderate walking, 3-5 times/week for 30-50 minutes each session. Aim for at least 150 minutes.week. Goal should be pace of 3 miles/hours, or walking 1.5 miles in 30 minutes -recommend avoidance of tobacco products. Avoid excess alcohol.  Plan for follow up: 6 mos per patient preference  Medication Adjustments/Labs and Tests Ordered: Current medicines are reviewed at length with the patient today.  Concerns regarding medicines are outlined above.  Orders Placed This Encounter  Procedures  . EKG 12-Lead   No orders of the defined types were placed in this encounter.   Patient Instructions  Medication Instructions:   Your Physician recommend you continue on your current medication as directed.    *If you need a refill on your cardiac medications before your next appointment, please call your pharmacy*   Lab Work: None   Testing/Procedures: None   Follow-Up: At [facility], you and your health needs are our priority.  As part of our continuing mission to provide you with exceptional heart care, we have created designated Provider Care Teams.  These Care Teams include your primary Cardiologist (physician) and Advanced Practice Providers (APPs -  Physician Assistants and Nurse Practitioners) who all work together to provide you with the care you need, when you need it.  We recommend signing up for the patient portal called "MyChart".  Sign up information is provided on this After Visit Summary.  MyChart is used to connect with patients for Virtual Visits (Telemedicine).  Patients are able to view lab/test results, encounter notes, upcoming appointments, etc.  Non-urgent messages can be sent to your provider as well.   To learn more about what you can do with MyChart, go to [email or web address].    Your next appointment:   6 month(s)  The format for your next appointment:   In Person  Provider:   Buford Dresser, MD        Signed, Buford Dresser, MD PhD 02/25/2021  [place]

## 2021-03-05 ENCOUNTER — Other Ambulatory Visit: Payer: Self-pay | Admitting: Family Medicine

## 2021-03-13 ENCOUNTER — Ambulatory Visit: Payer: Self-pay

## 2021-03-13 ENCOUNTER — Other Ambulatory Visit: Payer: Self-pay

## 2021-03-13 ENCOUNTER — Ambulatory Visit (INDEPENDENT_AMBULATORY_CARE_PROVIDER_SITE_OTHER): Payer: Medicare Other | Admitting: Physical Medicine and Rehabilitation

## 2021-03-13 ENCOUNTER — Encounter: Payer: Self-pay | Admitting: Physical Medicine and Rehabilitation

## 2021-03-13 VITALS — BP 143/91 | HR 112

## 2021-03-13 DIAGNOSIS — G8929 Other chronic pain: Secondary | ICD-10-CM | POA: Diagnosis not present

## 2021-03-13 DIAGNOSIS — G894 Chronic pain syndrome: Secondary | ICD-10-CM | POA: Diagnosis not present

## 2021-03-13 DIAGNOSIS — M5416 Radiculopathy, lumbar region: Secondary | ICD-10-CM | POA: Diagnosis not present

## 2021-03-13 DIAGNOSIS — M47816 Spondylosis without myelopathy or radiculopathy, lumbar region: Secondary | ICD-10-CM | POA: Diagnosis not present

## 2021-03-13 DIAGNOSIS — M797 Fibromyalgia: Secondary | ICD-10-CM | POA: Diagnosis not present

## 2021-03-13 DIAGNOSIS — M545 Low back pain, unspecified: Secondary | ICD-10-CM | POA: Diagnosis not present

## 2021-03-13 MED ORDER — BETAMETHASONE SOD PHOS & ACET 6 (3-3) MG/ML IJ SUSP: 12.0000 mg | Freq: Once | INTRAMUSCULAR | Status: DC

## 2021-03-13 NOTE — Progress Notes (Signed)
Megan Dixon - 55 y.o. female MRN [id number]  Date of birth: [birth year]  Office Visit Note: Visit Date: 03/13/2021 PCP: Gifford Shave, MD Referred by: Gifford Shave, MD  Subjective: Chief Complaint  Patient presents with  . Lower Back - Pain  . Left Leg - Pain  . Right Leg - Pain  . Right Foot - Pain  . Left Foot - Pain   HPI:  Megan Dixon is a 55 y.o. female who comes in today For evaluation management of chronic worsening severe low back pain right more than left with pain in the legs and really pain all over.  She comes in today again initially requested by her primary care physician or Dr. Caron Presume.  We completed epidural injection several months ago and she states that it helped a little bit and lasted for a while.  She reports 9 out of 10 pain worse with walking and standing somewhat better at rest but really present all the time.  Really limits her daily activities.  She takes pain medications using heating pad for help.  She has had therapy in the past.  Her chronic pain history is quite complicated and as detailed in my initial note with her but includes somewhat interesting history of fibromyalgia and possible history of lupus and possible history of multiple sclerosis.  Lumbar spine MRI again reviewed with her today shows mainly facet arthropathy moderate at L5-S1 actually left more than right despite having pain more right than left.  She has no hip or groin pain.  She does have pain in both legs and paresthesias in a nondermatomal fashion.  She does have this history of fibromyalgia.  She has had no new trauma since I saw her last.  We spoke at great length about the epidural injection and did not seem to offer much results.  Her MRI is pretty benign from a structural standpoint.  Review of Systems  Musculoskeletal: Positive for back pain, joint pain and neck pain.  Neurological: Positive for tingling.  All other systems reviewed and are negative.  Otherwise per  HPI.  Assessment & Plan: Visit Diagnoses:    ICD-10-CM   1. Spondylosis without myelopathy or radiculopathy, lumbar region  M47.816   2. Lumbar radiculopathy  M54.16 XR C-ARM NO REPORT    Epidural Steroid injection    betamethasone acetate-betamethasone sodium phosphate (CELESTONE) injection 12 mg  3. Chronic right-sided low back pain without sciatica  M54.50    G89.29   4. Chronic pain syndrome  G89.4   5. Fibromyalgia  M79.7     Plan: Findings:  1.  Chronic worsening severe right more than left low back pain and really her biggest complaint is the axial back pain referred somewhat into the hip.  It seems to be unrelated to the paresthesias in the legs at this point given the imaging findings and the clinical history.  I think the best approach is a diagnostic right L5-S1 facet joint block and just see how much relief she gets.  She could do well with ablation of the lower facet joint.  We do have patients with somewhat mild arthritic changes but with fibromyalgia that does seem to give him some sort of extra sensory type of issue where they do feel that more than he would expect.  Otherwise I think most of her pain really is fibromyalgia related and I think she would do really well with a more comprehensive pain management approach and this may be facilitated through her primary  care physician and may be appropriate referral.  We are not able to offer comprehensive pain management here through the orthopedic office.    Meds & Orders:  Meds ordered this encounter  Medications  . betamethasone acetate-betamethasone sodium phosphate (CELESTONE) injection 12 mg    Orders Placed This Encounter  Procedures  . XR C-ARM NO REPORT  . Epidural Steroid injection    Follow-up: No follow-ups on file.   Procedures: No procedures performed  Lumbar Facet Joint Intra-Articular Injection(s) with Fluoroscopic Guidance  Patient: Megan Dixon      Date of Birth: 09-Nov-1966 MRN: [id number] PCP:  Gifford Shave, MD      Visit Date: 03/13/2021   Universal Protocol:    Date/Time: 03/13/2021  Consent Given By: the patient  Position: PRONE   Additional Comments: Vital signs were monitored before and after the procedure. Patient was prepped and draped in the usual sterile fashion. The correct patient, procedure, and site was verified.   Injection Procedure Details:  Procedure Site One Meds Administered:  Meds ordered this encounter  Medications  . betamethasone acetate-betamethasone sodium phosphate (CELESTONE) injection 12 mg     Laterality: Right  Location/Site:  L5-S1  Needle size: 22 guage  Needle type: Spinal  Needle Placement: Articular  Findings:  -Comments: Excellent flow of contrast producing a partial arthrogram.  Procedure Details: The fluoroscope beam is vertically oriented in AP, and the inferior recess is visualized beneath the lower pole of the inferior apophyseal process, which represents the target point for needle insertion. When direct visualization is difficult the target point is located at the medial projection of the vertebral pedicle. The region overlying each aforementioned target is locally anesthetized with a 1 to 2 ml. volume of 1% Lidocaine without Epinephrine.   The spinal needle was inserted into each of the above mentioned facet joints using biplanar fluoroscopic guidance. A 0.25 to 0.5 ml. volume of Isovue-250 was injected and a partial facet joint arthrogram was obtained. A single spot film was obtained of the resulting arthrogram.    One to 1.25 ml of the steroid/anesthetic solution was then injected into each of the facet joints noted above.   Additional Comments:  The patient tolerated the procedure well Dressing: 2 x 2 sterile gauze and Band-Aid    Post-procedure details: Patient was observed during the procedure. Post-procedure instructions were reviewed.  Patient left the clinic in stable condition.      Clinical  History: MRI LUMBAR SPINE WITHOUT CONTRAST  TECHNIQUE: Multiplanar, multisequence MR imaging of the lumbar spine was performed. No intravenous contrast was administered.  COMPARISON:  None.  FINDINGS: Segmentation:  5 lumbar type vertebral bodies assumed.  Alignment: Mild curvature convex to the left with the apex at L2-3. Somewhat exaggerated lumbar lordosis.  Vertebrae:  No fracture or primary bone lesion.  Conus medullaris and cauda equina: Conus extends to the L1-2 level. Conus and cauda equina appear normal.  Paraspinal and other soft tissues: Negative  Disc levels:  No abnormality at L1-2 or above.  L2-3: Mild desiccation and minimal bulging of the disc. Mild facet and ligamentous prominence. No compressive stenosis.  L3-4: Desiccation and mild bulging of the disc. Mild facet and ligamentous prominence. No compressive stenosis.  L4-5: Minimal bulging of the disc. Mild facet and ligamentous prominence. No compressive stenosis.  L5-S1: Normal appearance of the disc. Mild facet and ligamentous prominence. Facet degeneration worse on the left, which could contribute to low back pain. No compressive stenosis.  IMPRESSION: 1. No advanced  finding in the lumbar region. Mild curvature convex to the left with the apex at L2-3. Somewhat exaggerated lumbar lordosis. 2. Mild degenerative disc disease and degenerative facet disease at L2-3, L3-4 and L4-5 with minimal disc bulges. No compressive stenosis of the canal or foramina. 3. 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   Electronically Signed   By: Nelson Chimes M.D.   On: 08/09/2020 15:28     Objective:  VS:  HT:    WT:   BMI:     BP:(!) 143/91  HR:(!) 112bpm  TEMP: ( )  RESP:  Physical Exam Vitals and nursing note reviewed.  Constitutional:      General: She is not in acute distress.    Appearance: Normal appearance. She is  obese. She is not ill-appearing.  HENT:     Head: Normocephalic and atraumatic.     Right Ear: External ear normal.     Left Ear: External ear normal.  Eyes:     Extraocular Movements: Extraocular movements intact.  Cardiovascular:     Rate and Rhythm: Normal rate.     Pulses: Normal pulses.  Pulmonary:     Effort: Pulmonary effort is normal. No respiratory distress.  Abdominal:     General: There is no distension.     Palpations: Abdomen is soft.  Musculoskeletal:        General: Tenderness present.     Cervical back: Neck supple.     Right lower leg: No edema.     Left lower leg: No edema.     Comments: Patient has good distal strength with no pain over the greater trochanters.  No clonus or focal weakness. Patient somewhat slow to rise from a seated position to full extension.  There is concordant low back pain with facet loading and lumbar spine extension rotation.  There are no definitive trigger points but the patient is somewhat tender across the lower back and PSIS.  There is no pain with hip rotation.   Skin:    Findings: No erythema, lesion or rash.  Neurological:     General: No focal deficit present.     Mental Status: She is alert and oriented to person, place, and time.     Sensory: No sensory deficit.     Motor: No weakness or abnormal muscle tone.     Coordination: Coordination normal.  Psychiatric:        Mood and Affect: Mood normal.        Behavior: Behavior normal.      Imaging: No results found.

## 2021-03-13 NOTE — Progress Notes (Signed)
Pt state lower back pain that travels down both legs to her feet. Pt state walking and standing makes the pain worse. Pt state she take pain meds and uses heating pads to help ease her pain. Pt has hx of inj on 10/24/20 pt state it relief sum pain and lasted a months.  Numeric Pain Rating Scale and Functional Assessment Average Pain 9   In the last MONTH (on 0-10 scale) has pain interfered with the following?  1. General activity like being  able to carry out your everyday physical activities such as walking, climbing stairs, carrying groceries, or moving a chair?  Rating(10)   +Driver, -BT, -Dye Allergies.

## 2021-03-13 NOTE — Patient Instructions (Signed)

## 2021-03-25 ENCOUNTER — Encounter: Payer: Self-pay | Admitting: Physical Medicine and Rehabilitation

## 2021-03-25 NOTE — Procedures (Signed)
Lumbar Facet Joint Intra-Articular Injection(s) with Fluoroscopic Guidance  Patient: Megan Dixon      Date of Birth: 05-27-1966 MRN: [id number] PCP: Gifford Shave, MD      Visit Date: 03/13/2021   Universal Protocol:    Date/Time: 03/13/2021  Consent Given By: the patient  Position: PRONE   Additional Comments: Vital signs were monitored before and after the procedure. Patient was prepped and draped in the usual sterile fashion. The correct patient, procedure, and site was verified.   Injection Procedure Details:  Procedure Site One Meds Administered:  Meds ordered this encounter  Medications  . betamethasone acetate-betamethasone sodium phosphate (CELESTONE) injection 12 mg     Laterality: Right  Location/Site:  L5-S1  Needle size: 22 guage  Needle type: Spinal  Needle Placement: Articular  Findings:  -Comments: Excellent flow of contrast producing a partial arthrogram.  Procedure Details: The fluoroscope beam is vertically oriented in AP, and the inferior recess is visualized beneath the lower pole of the inferior apophyseal process, which represents the target point for needle insertion. When direct visualization is difficult the target point is located at the medial projection of the vertebral pedicle. The region overlying each aforementioned target is locally anesthetized with a 1 to 2 ml. volume of 1% Lidocaine without Epinephrine.   The spinal needle was inserted into each of the above mentioned facet joints using biplanar fluoroscopic guidance. A 0.25 to 0.5 ml. volume of Isovue-250 was injected and a partial facet joint arthrogram was obtained. A single spot film was obtained of the resulting arthrogram.    One to 1.25 ml of the steroid/anesthetic solution was then injected into each of the facet joints noted above.   Additional Comments:  The patient tolerated the procedure well Dressing: 2 x 2 sterile gauze and Band-Aid    Post-procedure  details: Patient was observed during the procedure. Post-procedure instructions were reviewed.  Patient left the clinic in stable condition.

## 2021-04-04 ENCOUNTER — Other Ambulatory Visit: Payer: Self-pay | Admitting: Family Medicine

## 2021-04-10 ENCOUNTER — Other Ambulatory Visit: Payer: Self-pay

## 2021-04-10 NOTE — Telephone Encounter (Signed)
Pt called in requesting that Dr Caron Presume call her in more medicine for her vertigo @ [facility]

## 2021-04-11 MED ORDER — MECLIZINE HCL 25 MG PO TABS: 25.0000 mg | ORAL_TABLET | Freq: Three times a day (TID) | ORAL | 0 refills | Status: DC | PRN

## 2021-05-08 ENCOUNTER — Other Ambulatory Visit: Payer: Self-pay | Admitting: Family Medicine

## 2021-05-15 ENCOUNTER — Other Ambulatory Visit: Payer: Self-pay | Admitting: Family Medicine

## 2021-06-04 ENCOUNTER — Other Ambulatory Visit: Payer: Self-pay | Admitting: Family Medicine

## 2021-06-09 ENCOUNTER — Ambulatory Visit (INDEPENDENT_AMBULATORY_CARE_PROVIDER_SITE_OTHER): Payer: Medicare Other | Admitting: Family Medicine

## 2021-06-09 ENCOUNTER — Other Ambulatory Visit: Payer: Self-pay

## 2021-06-09 ENCOUNTER — Encounter: Payer: Self-pay | Admitting: Family Medicine

## 2021-06-09 ENCOUNTER — Other Ambulatory Visit (HOSPITAL_COMMUNITY)
Admission: RE | Admit: 2021-06-09 | Discharge: 2021-06-09 | Disposition: A | Discharge status: Home / Self Care | Payer: Medicare Other | Source: Ambulatory Visit | Attending: Family Medicine | Admitting: Family Medicine

## 2021-06-09 VITALS — HR 89 | Ht 61.0 in | Wt 306.6 lb

## 2021-06-09 DIAGNOSIS — Z124 Encounter for screening for malignant neoplasm of cervix: Secondary | ICD-10-CM | POA: Insufficient documentation

## 2021-06-09 DIAGNOSIS — R739 Hyperglycemia, unspecified: Secondary | ICD-10-CM

## 2021-06-09 DIAGNOSIS — R102 Pelvic and perineal pain: Secondary | ICD-10-CM | POA: Diagnosis not present

## 2021-06-09 DIAGNOSIS — N949 Unspecified condition associated with female genital organs and menstrual cycle: Secondary | ICD-10-CM | POA: Diagnosis not present

## 2021-06-09 DIAGNOSIS — R7303 Prediabetes: Secondary | ICD-10-CM | POA: Diagnosis not present

## 2021-06-09 DIAGNOSIS — Z1151 Encounter for screening for human papillomavirus (HPV): Secondary | ICD-10-CM | POA: Diagnosis not present

## 2021-06-09 DIAGNOSIS — Z01419 Encounter for gynecological examination (general) (routine) without abnormal findings: Secondary | ICD-10-CM | POA: Diagnosis present

## 2021-06-09 LAB — POCT WET PREP (WET MOUNT)
Clue Cells Wet Prep Whiff POC: NEGATIVE
Trichomonas Wet Prep HPF POC: ABSENT

## 2021-06-09 LAB — POCT GLYCOSYLATED HEMOGLOBIN (HGB A1C): Hemoglobin A1C: 6.2 % — AB (ref 4.0–5.6)

## 2021-06-09 MED ORDER — NYSTATIN 100000 UNIT/GM EX CREA: 1.0000 "application " | TOPICAL_CREAM | Freq: Four times a day (QID) | CUTANEOUS | 1 refills | Status: AC

## 2021-06-09 MED ORDER — FLUOXETINE HCL 20 MG PO CAPS: 60.0000 mg | ORAL_CAPSULE | Freq: Every day | ORAL | 3 refills | Status: DC

## 2021-06-09 MED ORDER — ESTROGENS, CONJUGATED 0.625 MG/GM VA CREA: 1.0000 | TOPICAL_CREAM | Freq: Every day | VAGINAL | Status: DC

## 2021-06-09 NOTE — Progress Notes (Signed)
    SUBJECTIVE:   CHIEF COMPLAINT / HPI:   Pelvic pain Patient reports that she has been having pain in her groin for approximately 1 month.  She reports it is not a burning but it is just a "pain" that starts just below her clitoris and moves down her labia to her bottom.  She also reports pain underneath her pannus.  She reports that it burns and itches there.  Reports that she has not been sexually active.  Over 2 years.  She reports occasional strange white discharge but other than that no concerns.  Has not had any abnormal uterine bleeding or uterine bleeding in general since having an ablation completed approximately 8-10 years ago.  Need for Pap smear Patient's last Pap smear was approximately 3 years ago.  Is in need of Pap smear today.  I will be completed.  Prediabetes checkup Most recent hemoglobin A1c was 5.9.  Patient was hospitalized in February and had an elevated blood glucose of 136.  She is now having a yeast infection in her pannus.  We will check a hemoglobin A1c today.  OBJECTIVE:   Pulse 89   Ht 5\' 1"  (1.549 m)   Wt (!) 306 lb 9.6 oz (139.1 kg)   SpO2 96%   BMI 57.93 kg/m   General: Well-appearing 55 year old female, no acute distress Cardiac: Regular rate and rhythm, no murmurs appreciated Respiratory: Normal work of breathing GU: Patient with mildly atrophic appearing external vaginal tissue, no erythema noted, small amount of thick discharge noted in vaginal vault, cervix is nonfriable, nonerythematous, bimanual exam showed no gross masses appreciated  Abdomen: Soft, erythema noted under pannus concerning for yeast  ASSESSMENT/PLAN:   Screening for cervical cancer Pap smear with HPV cotesting collected and sent today.  Perineal pain in female Patient with 1 month history of perineal pain which she reports starts at the anterior aspect of her vagina near her clitoris and then radiates down to her bottom.  She also reports pain underneath her pannus.  The  pain underneath her pannus is most likely related to a Candida infection.  Nystatin prescribed for this.  Regarding the vaginal pain concerned that this is atrophic vaginitis and have prescribed estrogen cream.  We will follow-up in 2-3 weeks to ensure that symptoms are improving.  You may end up referring to gynecology for further evaluation.  Prediabetes Hemoglobin A1c today was 6.2. - BMP and lipid panel ordered     Gifford Shave, MD [place]

## 2021-06-09 NOTE — Patient Instructions (Signed)
It was great seeing you today!  We completed a Pap smear today and those results will go to your MyChart or if there are any abnormalities I will call you.  Regarding your pelvic pain I have prescribed a medication called Premarin which you will apply to your vagina daily for 1 month.  If the pain does not go away I can send a referral in for you to go see gynecology.  Regarding the yeast infection on your stomach I have sent a prescription for nystatin cream which she will apply to your stomach for 2 weeks.  I also sent a refill for your Prozac to the pharmacy.  If you have any worsening symptoms, questions, concerns please feel free to call the clinic.  I hope you have a wonderful afternoon!

## 2021-06-09 NOTE — Assessment & Plan Note (Signed)
Pap smear with HPV cotesting collected and sent today.

## 2021-06-09 NOTE — Assessment & Plan Note (Signed)
Hemoglobin A1c today was 6.2. - BMP and lipid panel ordered

## 2021-06-09 NOTE — Assessment & Plan Note (Signed)
Patient with 1 month history of perineal pain which she reports starts at the anterior aspect of her vagina near her clitoris and then radiates down to her bottom.  She also reports pain underneath her pannus.  The pain underneath her pannus is most likely related to a Candida infection.  Nystatin prescribed for this.  Regarding the vaginal pain concerned that this is atrophic vaginitis and have prescribed estrogen cream.  We will follow-up in 2-3 weeks to ensure that symptoms are improving.  You may end up referring to gynecology for further evaluation.

## 2021-06-10 ENCOUNTER — Other Ambulatory Visit: Payer: Self-pay

## 2021-06-10 LAB — CYTOLOGY - PAP
Chlamydia: NEGATIVE
Comment: NEGATIVE
Comment: NEGATIVE
Comment: NEGATIVE
Comment: NORMAL
Diagnosis: NEGATIVE
High risk HPV: POSITIVE — AB
Neisseria Gonorrhea: NEGATIVE
Trichomonas: NEGATIVE

## 2021-06-10 NOTE — Telephone Encounter (Signed)
Patient calls nurse line reporting [facility]. [facility] and [facility] was set to "clinic admin," I gave a verbal for this. Fluoxetine is on hold until 7/12, insurance will not pay until then. I discussed options with the pharmacist and a good rx card will be provided to allow low cost for a 2 week supply. Patient was able to pick up nystatin without difficulty.   Patient contacted and advised of above and happy with plan. Patient is requesting a refill on Protonix.

## 2021-06-11 ENCOUNTER — Other Ambulatory Visit: Payer: Self-pay | Admitting: Family Medicine

## 2021-06-11 MED ORDER — PANTOPRAZOLE SODIUM 40 MG PO TBEC: 40.0000 mg | DELAYED_RELEASE_TABLET | Freq: Every day | ORAL | 3 refills | Status: DC

## 2021-06-22 ENCOUNTER — Other Ambulatory Visit: Payer: Self-pay | Admitting: Family Medicine

## 2021-06-25 ENCOUNTER — Ambulatory Visit: Payer: Medicare Other | Admitting: Family Medicine

## 2021-06-29 ENCOUNTER — Other Ambulatory Visit: Payer: Self-pay | Admitting: Family Medicine

## 2021-07-14 ENCOUNTER — Other Ambulatory Visit: Payer: Self-pay | Admitting: Family Medicine

## 2021-07-22 ENCOUNTER — Other Ambulatory Visit: Payer: Self-pay | Admitting: Family Medicine

## 2021-07-24 ENCOUNTER — Other Ambulatory Visit: Payer: Self-pay

## 2021-07-24 ENCOUNTER — Ambulatory Visit (INDEPENDENT_AMBULATORY_CARE_PROVIDER_SITE_OTHER): Payer: Medicare Other | Admitting: Family Medicine

## 2021-07-24 VITALS — BP 124/80 | HR 100 | Wt 313.0 lb

## 2021-07-24 DIAGNOSIS — N949 Unspecified condition associated with female genital organs and menstrual cycle: Secondary | ICD-10-CM

## 2021-07-24 DIAGNOSIS — R102 Pelvic and perineal pain: Secondary | ICD-10-CM

## 2021-07-24 NOTE — Progress Notes (Signed)
Patient ID: Megan Dixon, female   DOB: 06-27-1966, 55 y.o.   MRN: [id number]   Send for abnormal PAP report by PCP Also C/O Vulva/Clitoris pain  The procedure was performed by the resident and was supervised by me.  GU exam:  No obvious external genitalia lesion or abnormality. Initially difficult to visualize the cervix. We then switched to a longer speculum which improved visualization. Satisfactory transitional zone for age - post-menopausal No abnormal cervical vascularization with a green light filter. No aceto-white lesion. Lugol's uptake adequate.  A/P:  Benign colposcopy exam Repeat co-testing in a year discussed. Health maintenance updated to reflect screening.  Gyn referral for GU pain.

## 2021-07-24 NOTE — Patient Instructions (Addendum)
Today we looked at your cervix and did not see any abnormal cells to biopsy. We recommend repeating pap smear in 1 year. I have also referred you to Gynecology and they will call you to schedule an appointment. If you do not hear anything in the next 1-2 weeks, please let the clinic know so we can check on the referral.   Take care!  Dr. Arby Barrette   Colposcopy, Care After This sheet gives you information about how to care for yourself after your procedure. Your doctor may also give you more specific instructions. If youhave problems or questions, contact your doctor. What can I expect after the procedure? If you did not have a sample of your tissue taken out (did not have a biopsy), you may only have some spotting of blood for a few days. You can go back toyour normal activities. If you had a sample of your tissue taken out, it is common to have: Soreness and mild pain. These may last for a few days. A light-headed feeling. Mild bleeding or fluid (discharge) coming from your vagina. The fluid will look dark and grainy. You may have this for a few days. The fluid may be caused by a liquid that was used during your procedure. You may need to wear a sanitary pad. Spotting of blood for at least 48 hours after the procedure. Follow these instructions at home: Medicines Take over-the-counter and prescription medicines only as told by your doctor. Ask your doctor what medicines you can start taking again. This is very important if you take blood thinners. Activity Limit your activity for the first day after your procedure as told by your doctor. For at least 3 days, or for as long as told by your doctor, avoid: Douching. Using tampons. Having sex. Return to your normal activities as told by your doctor. Ask your doctor what activities are safe for you. General instructions  Drink enough fluid to keep your pee (urine) pale yellow. Ask your doctor if you may take baths, swim, or use a hot tub. You  may take showers. If you use birth control (contraception), keep using it. Keep all follow-up visits as told by your doctor. This is important.  Contact a doctor if: You get a skin rash. Get help right away if: You bleed a lot from your vagina. A lot of bleeding means you use more than one pad an hour for 2 hours in a row. You have clumps of blood (blood clots) coming from your vagina. You have a fever or chills. You have signs of infection. This may be fluid coming from your vagina that is: Different than normal. Yellow. Bad-smelling. You have very bad pain or cramps in your lower belly that do not get better with medicine. You faint. Summary If you did not have a sample of your tissue taken out, you may only have some spotting of blood for a few days. You can go back to your normal activities. If you had a sample of your tissue taken out, it is common to have mild pain for a few days and spotting for 48 hours. Avoid douching, using tampons, and having sex for at least 3 days after the procedure or for as long as told. Get help right away if you have a lot of bleeding, very bad pain, or signs of infection. This information is not intended to replace advice given to you by your health care provider. Make sure you discuss any questions you have with your  healthcare provider. Document Revised: 10/01/2020 Document Reviewed: 11/29/2019 Elsevier Patient Education  2022 [employer].

## 2021-07-24 NOTE — Progress Notes (Signed)
      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  Patient states that she is still having vaginal pain that has occurred for the past 2 months.  Denies having this prior.  Has tried over-the-counter pain relief and estrogen cream both without relief.  Denies issues with urination, or vaginal discharge.   OBJECTIVE:   BP 124/80   Pulse 100   Wt (!) 313 lb (142 kg)   BMI 59.14 kg/m    Physical exam General: well appearing, NAD Lungs: breathing comfortably on RA  Pelvic: Normal cervix and vulva without lesions, discharge, or bleeding   ASSESSMENT/PLAN:   No problem-specific Assessment & Plan notes found for this encounter.    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 No prior pap in records.   Procedure Details  The risks and benefits of the procedure and Written informed consent obtained.  Speculum placed in vagina and excellent visualization of cervix achieved, cervix swabbed x 2 with acetic acid solution.  Findings: Cervix: no visible lesions; cervix swabbed with Lugol's solution and no biopsies taken. Vaginal inspection: normal without visible lesions. Vulvar colposcopy: vulvar colposcopy not performed.  Specimens: n/a  Complications: none.  Plan: Repeat Pap in 1 year      Shary Key, [place]

## 2021-08-01 ENCOUNTER — Encounter: Payer: Self-pay | Admitting: Obstetrics and Gynecology

## 2021-08-01 ENCOUNTER — Other Ambulatory Visit: Payer: Self-pay

## 2021-08-01 ENCOUNTER — Ambulatory Visit (INDEPENDENT_AMBULATORY_CARE_PROVIDER_SITE_OTHER): Payer: Medicare Other | Admitting: Obstetrics and Gynecology

## 2021-08-01 VITALS — BP 140/84 | HR 90 | Ht 60.0 in | Wt 310.0 lb

## 2021-08-01 DIAGNOSIS — R109 Unspecified abdominal pain: Secondary | ICD-10-CM | POA: Diagnosis not present

## 2021-08-01 DIAGNOSIS — B3731 Acute candidiasis of vulva and vagina: Secondary | ICD-10-CM

## 2021-08-01 DIAGNOSIS — M6289 Other specified disorders of muscle: Secondary | ICD-10-CM

## 2021-08-01 DIAGNOSIS — N94819 Vulvodynia, unspecified: Secondary | ICD-10-CM

## 2021-08-01 DIAGNOSIS — B373 Candidiasis of vulva and vagina: Secondary | ICD-10-CM

## 2021-08-01 DIAGNOSIS — N8111 Cystocele, midline: Secondary | ICD-10-CM | POA: Diagnosis not present

## 2021-08-01 DIAGNOSIS — R102 Pelvic and perineal pain: Secondary | ICD-10-CM

## 2021-08-01 DIAGNOSIS — N898 Other specified noninflammatory disorders of vagina: Secondary | ICD-10-CM

## 2021-08-01 LAB — WET PREP FOR TRICH, YEAST, CLUE

## 2021-08-01 MED ORDER — FLUCONAZOLE 150 MG PO TABS: 150.0000 mg | ORAL_TABLET | Freq: Once | ORAL | 0 refills | Status: AC

## 2021-08-01 MED ORDER — LIDOCAINE 5 % EX OINT: 1.0000 "application " | TOPICAL_OINTMENT | Freq: Four times a day (QID) | CUTANEOUS | 0 refills | Status: DC | PRN

## 2021-08-01 NOTE — Progress Notes (Signed)
GYNECOLOGY  VISIT   HPI: 55 y.o.   Single Black or African American Not Hispanic or Latino  female   No obstetric history on file. with No LMP recorded. Patient has had an ablation.   here for  perineal pain. Abnormal Pap 06-09-21 Pos.HR HPV Pap from 06/09/21 returned as negative with +HPV. She had a colposcopy last week with her primary. She hasn't had a pap for many years, last pap was about 7 years ago. She had cryosurgery of her cervix in 1985.  She had an endometrial ablation in 2000. No menses since then. Negative wet prep and negative testing for GC/CT.   She c/o pelvic aching, then some sharp internal pain, she feels swollen at the opening of her vagina like something is coming out. Hurts to wipe at times. Symptoms started 3 months ago. She c/o a vaginal odor, some yellow vaginal d/c, no itching, burning or irritation. She is using ice packs on her perineum for the discomfort. Normal BM daily, occasional diarrhea. Voiding okay.  Some GSI, mild to moderate. Occurs weekly, not wearing a pad on a regular basis.  Not sexually active.   GYNECOLOGIC HISTORY: No LMP recorded. Patient has had an ablation. Contraception:BTL Menopausal hormone therapy: None        OB History     Gravida  3   Para  3   Term      Preterm      AB      Living  3      SAB      IAB      Ectopic      Multiple      Live Births                 Patient Active Problem List   Diagnosis Date Noted   Screening for cervical cancer 06/09/2021   Perineal pain in female 06/09/2021   Right wrist pain 01/24/2021   OSA (obstructive sleep apnea)    Palpitation 09/10/2020   Chronic diastolic heart failure ([place]) 09/10/2020   History of CVA (cerebrovascular accident) 09/10/2020   Congestive heart failure ([place]) 08/08/2020   Lower extremity edema 08/08/2020   Lower extremity pain, posterior, left 01/29/2020   Hyperlipidemia 01/09/2020   Depression 01/01/2020   Bilateral lower extremity pain  01/01/2020   Peripheral neuropathy 11/23/2019   Prediabetes 11/23/2019   Axillary lump, right 11/23/2019   Acute vestibular syndrome 08/29/2019   Ischemic vascular disease 08/29/2019   Migraine 08/29/2019   Morbid obesity ([place])    Family history of systemic lupus erythematosus 09/10/2018   Anxiety disorder 09/07/2018   Hypertension 08/01/2018   Fibromyalgia 08/01/2018   Blood-tinged sputum 08/01/2018   Chronic low back pain 08/16/2017   Gastroesophageal reflux disease 08/16/2017   Incarcerated incisional hernia 08/10/2017   S/P exploratory laparotomy 08/09/2017    Past Medical History:  Diagnosis Date   Arthritis    Depression    Fibromyalgia    Hypertension    Lupus ([place])    a. was told bloodwork was possibly positive for lupus in 08/2018 by PCP.   Morbid obesity ([place])    MS (multiple sclerosis) ([place])    a. presumptive dx in [place], [place]   OCD (obsessive compulsive disorder)    OSA (obstructive sleep apnea)    a. dx recently, pending CPAP initiation.   Palpitations    Pneumonia 07/2018   left lung    Past Surgical History:  Procedure Laterality Date   ABDOMINAL  SURGERY     BREAST EXCISIONAL BIOPSY Right    CARPAL TUNNEL RELEASE     HERNIA REPAIR     JOINT REPLACEMENT     VENTRAL HERNIA REPAIR N/A 08/09/2017   Procedure: Incarcerated ventral hernia repair with mesh ;  Surgeon: Rolm Bookbinder, MD;  Location: [facility];  Service: General;  Laterality: N/A;    Current Outpatient Medications  Medication Sig Dispense Refill   albuterol (VENTOLIN HFA) 108 (90 Base) MCG/ACT inhaler Inhale 1-2 puffs into the lungs every 6 (six) hours as needed for wheezing or shortness of breath. 8 g 2   amLODipine (NORVASC) 5 MG tablet Take 1 tablet (5 mg total) by mouth daily. 90 tablet 3   atorvastatin (LIPITOR) 40 MG tablet TAKE 1 TABLET BY MOUTH EVERY DAY (Patient taking differently: Take 40 mg by mouth daily.) 90 tablet 3   carvedilol (COREG) 6.25 MG tablet TAKE 1 TABLET BY MOUTH  2 TIMES DAILY WITH A MEAL. 180 tablet 1   clopidogrel (PLAVIX) 75 MG tablet TAKE 1 TABLET BY MOUTH EVERY DAY (Patient taking differently: Take 75 mg by mouth daily.) 90 tablet 3   cyclobenzaprine (FLEXERIL) 10 MG tablet Take 1 tablet (10 mg total) by mouth 3 (three) times daily as needed for muscle spasms. 90 tablet 1   Elastic Bandages & Supports (MEDICAL COMPRESSION STOCKINGS) MISC 1 Container by Does not apply route daily. Apply to both legs daily 2 each 2   FLUoxetine (PROZAC) 20 MG capsule Take 3 capsules (60 mg total) by mouth daily. 270 capsule 3   furosemide (LASIX) 20 MG tablet TAKE 1 TABLET BY MOUTH EVERY DAY IN THE MORNING (Patient taking differently: Take 20 mg by mouth every morning.) 90 tablet 1   furosemide (LASIX) 40 MG tablet TAKE 1 TABLET BY MOUTH EVERY DAY 30 tablet 1   gabapentin (NEURONTIN) 300 MG capsule TAKE 2 CAPSULES IN THE MORNING AND AT LUNCH AND 3 CAPSULES AT NIGHT. 210 capsule 3   losartan (COZAAR) 50 MG tablet TAKE 1 TABLET BY MOUTH EVERY DAY 90 tablet 3   meclizine (ANTIVERT) 25 MG tablet Take 1 tablet (25 mg total) by mouth 3 (three) times daily as needed for dizziness. 30 tablet 0   ondansetron (ZOFRAN ODT) 4 MG disintegrating tablet Take 1 tablet (4 mg total) by mouth every 8 (eight) hours as needed for nausea or vomiting. 20 tablet 0   pantoprazole (PROTONIX) 40 MG tablet Take 1 tablet (40 mg total) by mouth daily. 90 tablet 3   Current Facility-Administered Medications  Medication Dose Route Frequency Provider Last Rate Last Admin   betamethasone acetate-betamethasone sodium phosphate (CELESTONE) injection 12 mg  12 mg Other Once Magnus Sinning, MD       conjugated estrogens (PREMARIN) vaginal cream 1 Applicatorful  1 Applicatorful Vaginal Daily Gifford Shave, MD         ALLERGIES: Morphine and related, Other, Peanut-containing drug products, and Toradol [ketorolac tromethamine]  Family History  Problem Relation Age of Onset   Cancer Mother         Leukemia   Lung cancer Father    Cancer Sister        Leukemia   Lupus Sister    Lung cancer Brother    Seizures Brother    Heart attack Brother        Happened during seizure    Social History   Socioeconomic History   Marital status: Single    Spouse name: Not on file  Number of children: Not on file   Years of education: Not on file   Highest education level: Not on file  Occupational History   Not on file  Tobacco Use   Smoking status: Former    Types: Cigarettes   Smokeless tobacco: Never   Tobacco comments:    Smoked for 40 years, quit in 08/2018  Vaping Use   Vaping Use: Never used  Substance and Sexual Activity   Alcohol use: Not Currently   Drug use: Never   Sexual activity: Not Currently  Other Topics Concern   Not on file  Social History Narrative   Not on file   Social Determinants of Health   Financial Resource Strain: Not on file  Food Insecurity: Not on file  Transportation Needs: Not on file  Physical Activity: Not on file  Stress: Not on file  Social Connections: Not on file  Intimate Partner Violence: Not on file    ROS  PHYSICAL EXAMINATION:    BP 140/84 (Cuff Size: Large)   Pulse 90   Ht 5' (1.524 m)   Wt (!) 310 lb (140.6 kg)   SpO2 96%   BMI 60.54 kg/m     General appearance: alert, cooperative and appears stated age Abdomen: soft, non-tender; non distended, no masses,  no organomegaly  Pelvic: External genitalia:  no lesions, tender at introitus              Urethra:  normal appearing urethra with no masses, tenderness or lesions              Bartholins and Skenes: normal                 Vagina: normal appearing vagina with normal color and discharge, no lesions, small cystocele              Cervix: no lesions              Bimanual Exam:  Uterus:   no masses, diffusely tender              Adnexa:  no masses, diffusely tender              Rectovaginal: Yes.  .  Confirms.              Anus:  normal sphincter tone, no  lesions  Pelvic floor: tender bilaterally  Chaperone was present for exam.  1. Pelvic pain May be multifactorial.  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  Dispense: 30 g; Refill: 0  3. Pelvic floor dysfunction After ultrasound, consider PT  4. Midline cystocele   5. Vaginal discharge - WET PREP FOR TRICH, YEAST, CLUE: + [id number]  6. Yeast vaginitis - fluconazole (DIFLUCAN) 150 MG tablet; Take 1 tablet (150 mg total) by mouth once for 1 dose. Take one tablet.  Repeat in 72  hours if symptoms are not completely resolved.  Dispense: 2 tablet; Refill: 0  7. Combined abdominal and pelvic pain - US PELVIS TRANSVAGINAL NON-OB (TV ONLY); Future

## 2021-08-04 ENCOUNTER — Encounter: Payer: Self-pay | Admitting: Obstetrics and Gynecology

## 2021-08-07 ENCOUNTER — Other Ambulatory Visit: Payer: Self-pay | Admitting: Obstetrics and Gynecology

## 2021-08-07 ENCOUNTER — Other Ambulatory Visit: Payer: Medicare Other

## 2021-08-07 ENCOUNTER — Ambulatory Visit (INDEPENDENT_AMBULATORY_CARE_PROVIDER_SITE_OTHER): Payer: Medicare Other

## 2021-08-07 ENCOUNTER — Other Ambulatory Visit: Payer: Self-pay

## 2021-08-07 DIAGNOSIS — R109 Unspecified abdominal pain: Secondary | ICD-10-CM

## 2021-08-07 DIAGNOSIS — R102 Pelvic and perineal pain: Secondary | ICD-10-CM

## 2021-08-12 ENCOUNTER — Telehealth: Payer: Self-pay | Admitting: Obstetrics and Gynecology

## 2021-08-12 DIAGNOSIS — R102 Pelvic and perineal pain: Secondary | ICD-10-CM

## 2021-08-12 DIAGNOSIS — N94819 Vulvodynia, unspecified: Secondary | ICD-10-CM

## 2021-08-12 NOTE — Telephone Encounter (Signed)
Please let the patient know that her ultrasound showed a normal uterus. Neither ovary was seen, this can happen in PMP women. Typically an ovary with a cyst or mass is easy to see on ultrasound. Her ultrasound was slightly limited by her weight. I would recommend a referral to PT for pelvic floor pain and vulvodynia. If she is okay with this, please place the referral. She should f/u with her primary if her pain doesn't improve with PT.  CC: Dr Caron Presume

## 2021-08-12 NOTE — Telephone Encounter (Signed)
Patient informed with below note, referral placed at [facility] they will call to schedule. Patient aware of this as well.

## 2021-08-19 ENCOUNTER — Other Ambulatory Visit: Payer: Self-pay | Admitting: Family Medicine

## 2021-08-25 ENCOUNTER — Other Ambulatory Visit: Payer: Self-pay | Admitting: Family Medicine

## 2021-09-01 ENCOUNTER — Other Ambulatory Visit: Payer: Self-pay

## 2021-09-01 ENCOUNTER — Ambulatory Visit (INDEPENDENT_AMBULATORY_CARE_PROVIDER_SITE_OTHER): Payer: Medicare Other | Admitting: Family Medicine

## 2021-09-01 VITALS — BP 145/97 | HR 98 | Ht 61.0 in | Wt 312.6 lb

## 2021-09-01 DIAGNOSIS — M79604 Pain in right leg: Secondary | ICD-10-CM | POA: Diagnosis not present

## 2021-09-01 DIAGNOSIS — R0981 Nasal congestion: Secondary | ICD-10-CM

## 2021-09-01 DIAGNOSIS — M79605 Pain in left leg: Secondary | ICD-10-CM | POA: Diagnosis not present

## 2021-09-01 DIAGNOSIS — M797 Fibromyalgia: Secondary | ICD-10-CM

## 2021-09-01 DIAGNOSIS — H8190 Unspecified disorder of vestibular function, unspecified ear: Secondary | ICD-10-CM

## 2021-09-01 MED ORDER — BENZONATATE 100 MG PO CAPS: 100.0000 mg | ORAL_CAPSULE | Freq: Three times a day (TID) | ORAL | 0 refills | Status: DC | PRN

## 2021-09-01 MED ORDER — AZITHROMYCIN 250 MG PO TABS: ORAL_TABLET | ORAL | 0 refills | Status: DC

## 2021-09-01 MED ORDER — PANTOPRAZOLE SODIUM 40 MG PO TBEC: 40.0000 mg | DELAYED_RELEASE_TABLET | Freq: Every day | ORAL | 3 refills | Status: DC

## 2021-09-01 NOTE — Progress Notes (Signed)
    SUBJECTIVE:   CHIEF COMPLAINT / HPI:   Leg pain  Patient continues to have chronic leg pain.  She is taking all the medications prescribed without much benefit.  She would like further management of this pain.  She is currently seeing orthopedics for this and having regular injections with some benefit.  Dizziness  Patient reports worsening dizziness from vertigo over the last week.  She reports it is worse when she turns her head.  Improved with sitting still.  Worse when moving from sitting to standing or lying to sitting.  Denies any weakness of her extremities, changes in sensation, slurred speech.  Cough and congestion Patient reports that this has been going on for approximately 2 weeks.  She has noticed transition from clear sputum to greenish thick mucus.  Denies any fever, chills, other sick symptoms.  Is concerned that she may have some form of sinus infection.  Does not have a formal diagnosis of COPD although she does have a significant smoking history.  OBJECTIVE:   BP (!) 145/97 (Patient Position: Standing)   Pulse 98   Ht '5\' 1"'$  (1.549 m)   Wt (!) 312 lb 9.6 oz (141.8 kg)   SpO2 98%   BMI 59.07 kg/m   General: Well-appearing 55 year old female, obese, no acute distress Cardiac: Regular rate and rhythm, no murmurs appreciated, negative orthostatic vital signs Respiratory: Increased work of breathing with exertion, end expiratory wheezes noted, upper airway congestion noted Abdomen: Soft, nontender, positive bowel sounds Neuro: Positive Dix-Hallpike to the right.  Also positive to the left but worse on the right.  ASSESSMENT/PLAN:   Fibromyalgia Patient continues to have considerable issue with chronic pain.  Is using topical agents, lidocaine patch as.  We have tried transitioning to Cymbalta but this was unsuccessful because the patient's depression worsened.  We had to transition her back to her Prozac.  She has been referred to pain management but has not made any  appointments yet.  She missed appointment and was supposed to call and reschedule when I saw her last.  New referral placed for pain management.  Congestion of nasal sinus Patient with cough and congestion which has worsened over the past few days.  This has been going on for approximately 2 weeks.  No formal diagnosis of COPD although concerned that this may be COPD exacerbation.  Discussed steroid Dosepak as well as antibiotics.  Patient wishes not to have steroids at this time.  Will give prescription for azithromycin and Tessalon Perles.  Strict ED return precautions given and patient is agreeable to this.  Acute vestibular syndrome Patient with signs and symptoms consistent with vestibular issues.  She has been diagnosed with this in the past.  Physical exam reassuring that this is not a stroke.  This could also be BPPV.  That she was positive on Dix-Hallpike maneuver today.  Discussed Epley maneuvers and we can consider vestibular rehab if symptoms persist.  Strict ED return precautions given.  No further questions or concerns.     Gifford Shave, MD [place]

## 2021-09-01 NOTE — Patient Instructions (Addendum)
It was great seeing you today.  Regarding your dizziness I want you to see your cardiologist.  You may need what is called a Holter monitor to monitor your heart rhythm over an extended period of time.  If your symptoms worsen please seek medical attention in the emergency department.  Regarding your chronic pain I have sent a referral to pain management and someone will call you to make an appointment.  If you have any questions or concerns call the clinic.  I hope you have a wonderful afternoon!

## 2021-09-02 DIAGNOSIS — R0981 Nasal congestion: Secondary | ICD-10-CM | POA: Insufficient documentation

## 2021-09-02 NOTE — Assessment & Plan Note (Signed)
Patient with signs and symptoms consistent with vestibular issues.  She has been diagnosed with this in the past.  Physical exam reassuring that this is not a stroke.  This could also be BPPV.  That she was positive on Dix-Hallpike maneuver today.  Discussed Epley maneuvers and we can consider vestibular rehab if symptoms persist.  Strict ED return precautions given.  No further questions or concerns.

## 2021-09-02 NOTE — Assessment & Plan Note (Signed)
Patient with cough and congestion which has worsened over the past few days.  This has been going on for approximately 2 weeks.  No formal diagnosis of COPD although concerned that this may be COPD exacerbation.  Discussed steroid Dosepak as well as antibiotics.  Patient wishes not to have steroids at this time.  Will give prescription for azithromycin and Tessalon Perles.  Strict ED return precautions given and patient is agreeable to this.

## 2021-09-02 NOTE — Assessment & Plan Note (Signed)
Patient continues to have considerable issue with chronic pain.  Is using topical agents, lidocaine patch as.  We have tried transitioning to Cymbalta but this was unsuccessful because the patient's depression worsened.  We had to transition her back to her Prozac.  She has been referred to pain management but has not made any appointments yet.  She missed appointment and was supposed to call and reschedule when I saw her last.  New referral placed for pain management.

## 2021-09-17 ENCOUNTER — Other Ambulatory Visit: Payer: Self-pay | Admitting: Family Medicine

## 2021-10-04 ENCOUNTER — Other Ambulatory Visit: Payer: Self-pay | Admitting: Family Medicine

## 2021-10-06 ENCOUNTER — Ambulatory Visit: Payer: Medicare Other | Admitting: Obstetrics and Gynecology

## 2021-10-08 ENCOUNTER — Encounter: Payer: Self-pay | Admitting: Obstetrics and Gynecology

## 2021-10-08 ENCOUNTER — Ambulatory Visit (INDEPENDENT_AMBULATORY_CARE_PROVIDER_SITE_OTHER): Payer: Medicare Other | Admitting: Obstetrics and Gynecology

## 2021-10-08 ENCOUNTER — Other Ambulatory Visit: Payer: Self-pay

## 2021-10-08 VITALS — BP 110/80 | HR 92 | Ht 61.0 in | Wt 316.0 lb

## 2021-10-08 DIAGNOSIS — N9089 Other specified noninflammatory disorders of vulva and perineum: Secondary | ICD-10-CM

## 2021-10-08 DIAGNOSIS — N393 Stress incontinence (female) (male): Secondary | ICD-10-CM

## 2021-10-08 DIAGNOSIS — N94819 Vulvodynia, unspecified: Secondary | ICD-10-CM | POA: Diagnosis not present

## 2021-10-08 DIAGNOSIS — M6289 Other specified disorders of muscle: Secondary | ICD-10-CM | POA: Diagnosis not present

## 2021-10-08 NOTE — Progress Notes (Addendum)
GYNECOLOGY  VISIT   HPI: 55 y.o.   Single Black or African American Not Hispanic or Latino  female   G3P3 with No LMP recorded. Patient has had an ablation.   here for 2 month follow up for pelvic pain. She feels like she had pelvic pressure. She states that she has pain around her citreous.  She is also having lower abdominal muscle spasms.   The patient was seen in 8/22 for pelvic pain, pelvic floor tenderness and vulvodynia. She was noted to have a small cystocele.  She was treated for yeast, given lidocaine ointment. Ultrasound showed a normal uterus, neither ovary was seen.   She continues to have the sensation that something is going to come out of her vagina. She noticed a bulge when she was in the shower or with wiping. She had some improvement with treating the yeast. She has tried the lidocaine. Didn't really help.   She has mild gsi, recently has been wearing a pad.   She is using a muscle relaxant 3 x a day. She is on gabapentin 600 mg in the morning and mid day and 900 mg at night.   GYNECOLOGIC HISTORY: No LMP recorded. Patient has had an ablation. Contraception:tubal ligation  Menopausal hormone therapy: none          OB History     Gravida  3   Para  3   Term      Preterm      AB      Living  3      SAB      IAB      Ectopic      Multiple      Live Births                 Patient Active Problem List   Diagnosis Date Noted   Congestion of nasal sinus 09/02/2021   Screening for cervical cancer 06/09/2021   Perineal pain in female 06/09/2021   Right wrist pain 01/24/2021   OSA (obstructive sleep apnea)    Palpitation 09/10/2020   Chronic diastolic heart failure ([place]) 09/10/2020   History of CVA (cerebrovascular accident) 09/10/2020   Congestive heart failure ([place]) 08/08/2020   Lower extremity edema 08/08/2020   Lower extremity pain, posterior, left 01/29/2020   Hyperlipidemia 01/09/2020   Depression 01/01/2020   Bilateral lower  extremity pain 01/01/2020   Peripheral neuropathy 11/23/2019   Prediabetes 11/23/2019   Axillary lump, right 11/23/2019   Acute vestibular syndrome 08/29/2019   Ischemic vascular disease 08/29/2019   Migraine 08/29/2019   Morbid obesity ([place])    Family history of systemic lupus erythematosus 09/10/2018   Anxiety disorder 09/07/2018   Hypertension 08/01/2018   Fibromyalgia 08/01/2018   Blood-tinged sputum 08/01/2018   Chronic low back pain 08/16/2017   Gastroesophageal reflux disease 08/16/2017   Incarcerated incisional hernia 08/10/2017   S/P exploratory laparotomy 08/09/2017    Past Medical History:  Diagnosis Date   Arthritis    Depression    Fibromyalgia    Hypertension    Lupus ([place])    a. was told bloodwork was possibly positive for lupus in 08/2018 by PCP.   Morbid obesity ([place])    MS (multiple sclerosis) ([place])    a. presumptive dx in [place], [place]   OCD (obsessive compulsive disorder)    OSA (obstructive sleep apnea)    a. dx recently, pending CPAP initiation.   Palpitations    Pneumonia 07/2018   left  lung    Past Surgical History:  Procedure Laterality Date   ABDOMINAL SURGERY     BREAST EXCISIONAL BIOPSY Right    CARPAL TUNNEL RELEASE     HERNIA REPAIR     JOINT REPLACEMENT     VENTRAL HERNIA REPAIR N/A 08/09/2017   Procedure: Incarcerated ventral hernia repair with mesh ;  Surgeon: Rolm Bookbinder, MD;  Location: [facility];  Service: General;  Laterality: N/A;    Current Outpatient Medications  Medication Sig Dispense Refill   albuterol (VENTOLIN HFA) 108 (90 Base) MCG/ACT inhaler Inhale 1-2 puffs into the lungs every 6 (six) hours as needed for wheezing or shortness of breath. 8 g 2   amLODipine (NORVASC) 5 MG tablet Take 1 tablet (5 mg total) by mouth daily. 90 tablet 3   atorvastatin (LIPITOR) 40 MG tablet TAKE 1 TABLET BY MOUTH EVERY DAY 90 tablet 3   azithromycin (ZITHROMAX) 250 MG tablet Take 500 mg for one dose followed by 250 mg daily for 4  days 6 tablet 0   benzonatate (TESSALON PERLES) 100 MG capsule Take 1 capsule (100 mg total) by mouth 3 (three) times daily as needed for cough. 20 capsule 0   carvedilol (COREG) 6.25 MG tablet TAKE 1 TABLET BY MOUTH 2 TIMES DAILY WITH A MEAL. 180 tablet 1   clopidogrel (PLAVIX) 75 MG tablet TAKE 1 TABLET BY MOUTH EVERY DAY (Patient taking differently: Take 75 mg by mouth daily.) 90 tablet 3   cyclobenzaprine (FLEXERIL) 10 MG tablet Take 1 tablet (10 mg total) by mouth 3 (three) times daily as needed for muscle spasms. 90 tablet 1   Elastic Bandages & Supports (MEDICAL COMPRESSION STOCKINGS) MISC 1 Container by Does not apply route daily. Apply to both legs daily 2 each 2   FLUoxetine (PROZAC) 20 MG capsule Take 3 capsules (60 mg total) by mouth daily. 270 capsule 3   furosemide (LASIX) 20 MG tablet TAKE 1 TABLET BY MOUTH EVERY DAY IN THE MORNING (Patient taking differently: Take 20 mg by mouth every morning.) 90 tablet 1   furosemide (LASIX) 40 MG tablet TAKE 1 TABLET BY MOUTH EVERY DAY 30 tablet 1   gabapentin (NEURONTIN) 300 MG capsule TAKE 2 CAPSULES IN THE MORNING AND AT LUNCH AND 3 CAPSULES AT NIGHT. 210 capsule 3   lidocaine (XYLOCAINE) 5 % ointment Apply 1 application topically 4 (four) times daily as needed. 30 g 0   losartan (COZAAR) 50 MG tablet TAKE 1 TABLET BY MOUTH EVERY DAY 90 tablet 3   meclizine (ANTIVERT) 25 MG tablet Take 1 tablet (25 mg total) by mouth 3 (three) times daily as needed for dizziness. 30 tablet 0   ondansetron (ZOFRAN ODT) 4 MG disintegrating tablet Take 1 tablet (4 mg total) by mouth every 8 (eight) hours as needed for nausea or vomiting. 20 tablet 0   pantoprazole (PROTONIX) 40 MG tablet Take 1 tablet (40 mg total) by mouth daily. 90 tablet 3   Current Facility-Administered Medications  Medication Dose Route Frequency Provider Last Rate Last Admin   betamethasone acetate-betamethasone sodium phosphate (CELESTONE) injection 12 mg  12 mg Other Once Magnus Sinning, MD       conjugated estrogens (PREMARIN) vaginal cream 1 Applicatorful  1 Applicatorful Vaginal Daily Gifford Shave, MD         ALLERGIES: Morphine and related, Other, Peanut-containing drug products, and Toradol [ketorolac tromethamine]  Family History  Problem Relation Age of Onset   Cancer Mother  Leukemia   Lung cancer Father    Cancer Sister        Leukemia   Lupus Sister    Lung cancer Brother    Seizures Brother    Heart attack Brother        Happened during seizure    Social History   Socioeconomic History   Marital status: Single    Spouse name: Not on file   Number of children: Not on file   Years of education: Not on file   Highest education level: Not on file  Occupational History   Not on file  Tobacco Use   Smoking status: Former    Types: Cigarettes   Smokeless tobacco: Never   Tobacco comments:    Smoked for 40 years, quit in 08/2018  Vaping Use   Vaping Use: Never used  Substance and Sexual Activity   Alcohol use: Not Currently   Drug use: Never   Sexual activity: Not Currently  Other Topics Concern   Not on file  Social History Narrative   Not on file   Social Determinants of Health   Financial Resource Strain: Not on file  Food Insecurity: Not on file  Transportation Needs: Not on file  Physical Activity: Not on file  Stress: Not on file  Social Connections: Not on file  Intimate Partner Violence: Not on file    Review of Systems  All other systems reviewed and are negative.  PHYSICAL EXAMINATION:    There were no vitals taken for this visit.    General appearance: alert, cooperative and appears stated age  Pelvic: External genitalia:  area of irritation, ? Ulceration on the lower left vulva/upper buttock. Tender with gentle palpation of the vestibule with a cotton swab with local.               Urethra:  normal appearing urethra with no masses, tenderness or lesions              Bartholins and Skenes: normal                  Vagina: minimally atrophic appearing vagina with normal color and discharge, no lesions. Minimal cystocele.               Cervix: no cervical motion tenderness and no lesions              Bimanual Exam:  Uterus:   no masses or tenderness              Adnexa: no mass, fullness, tenderness              Pelvic floor: tender bilaterally, R>L  Chaperone was present for exam.  1. Vulvodynia Minimal help with treatment of yeast No help with lidocaine ointment Already on high dose Gabapentin - Ambulatory referral to Physical Therapy -Reviewed vulvar skin care (handout already given)  2. Pelvic floor dysfunction - Ambulatory referral to Physical Therapy  3. GSI (genuine stress incontinence), female Mild, discussed that urine and pads can be very irritating to the vulva.  - Ambulatory referral to Physical Therapy  ~29 minutes in total patient care.   Addendum: herpes culture sent from area of vulvar irritation.

## 2021-10-09 NOTE — Addendum Note (Signed)
Addended by: Dorothy Spark on: 10/09/2021 01:25 PM   Modules accepted: Orders

## 2021-10-13 ENCOUNTER — Other Ambulatory Visit: Payer: Self-pay

## 2021-10-13 ENCOUNTER — Ambulatory Visit (HOSPITAL_COMMUNITY)
Admission: RE | Admit: 2021-10-13 | Discharge: 2021-10-13 | Disposition: A | Discharge status: Home / Self Care | Payer: Medicare Other | Source: Ambulatory Visit | Attending: Orthopedic Surgery | Admitting: Orthopedic Surgery

## 2021-10-13 ENCOUNTER — Other Ambulatory Visit (HOSPITAL_COMMUNITY): Payer: Self-pay | Admitting: Orthopedic Surgery

## 2021-10-13 DIAGNOSIS — M1711 Unilateral primary osteoarthritis, right knee: Secondary | ICD-10-CM | POA: Diagnosis not present

## 2021-10-13 DIAGNOSIS — M79604 Pain in right leg: Secondary | ICD-10-CM | POA: Insufficient documentation

## 2021-10-13 LAB — SURESWAB HSV, TYPE 1/2 DNA, PCR
HSV 1 DNA: NOT DETECTED
HSV 2 DNA: NOT DETECTED

## 2021-10-20 ENCOUNTER — Other Ambulatory Visit: Payer: Self-pay | Admitting: Family Medicine

## 2021-10-28 ENCOUNTER — Ambulatory Visit (HOSPITAL_BASED_OUTPATIENT_CLINIC_OR_DEPARTMENT_OTHER): Payer: Medicare Other | Admitting: Cardiology

## 2021-11-17 ENCOUNTER — Ambulatory Visit (HOSPITAL_BASED_OUTPATIENT_CLINIC_OR_DEPARTMENT_OTHER): Payer: Medicare Other | Admitting: Cardiology

## 2021-11-18 ENCOUNTER — Other Ambulatory Visit: Payer: Self-pay | Admitting: Family Medicine

## 2021-11-22 ENCOUNTER — Other Ambulatory Visit: Payer: Self-pay | Admitting: Family Medicine

## 2021-11-23 ENCOUNTER — Other Ambulatory Visit: Payer: Self-pay | Admitting: Family Medicine

## 2021-11-28 ENCOUNTER — Ambulatory Visit (HOSPITAL_BASED_OUTPATIENT_CLINIC_OR_DEPARTMENT_OTHER): Payer: Medicare Other | Admitting: Cardiology

## 2021-12-17 ENCOUNTER — Ambulatory Visit (HOSPITAL_BASED_OUTPATIENT_CLINIC_OR_DEPARTMENT_OTHER): Payer: Medicare Other | Admitting: Family

## 2021-12-19 ENCOUNTER — Other Ambulatory Visit: Payer: Self-pay | Admitting: Family Medicine

## 2022-01-11 ENCOUNTER — Other Ambulatory Visit: Payer: Self-pay | Admitting: Family Medicine

## 2022-01-12 ENCOUNTER — Other Ambulatory Visit: Payer: Self-pay | Admitting: Family Medicine

## 2022-02-27 ENCOUNTER — Other Ambulatory Visit: Payer: Self-pay | Admitting: Family Medicine

## 2022-04-16 ENCOUNTER — Other Ambulatory Visit: Payer: Self-pay | Admitting: Family Medicine

## 2022-05-01 ENCOUNTER — Ambulatory Visit (INDEPENDENT_AMBULATORY_CARE_PROVIDER_SITE_OTHER): Payer: Medicare Other | Admitting: Student

## 2022-05-01 ENCOUNTER — Telehealth: Payer: Self-pay

## 2022-05-01 ENCOUNTER — Encounter: Payer: Self-pay | Admitting: Student

## 2022-05-01 VITALS — BP 153/95 | HR 96 | Ht 61.0 in | Wt 298.0 lb

## 2022-05-01 DIAGNOSIS — Z1231 Encounter for screening mammogram for malignant neoplasm of breast: Secondary | ICD-10-CM

## 2022-05-01 DIAGNOSIS — Z1159 Encounter for screening for other viral diseases: Secondary | ICD-10-CM

## 2022-05-01 DIAGNOSIS — I10 Essential (primary) hypertension: Secondary | ICD-10-CM | POA: Diagnosis not present

## 2022-05-01 DIAGNOSIS — M797 Fibromyalgia: Secondary | ICD-10-CM

## 2022-05-01 MED ORDER — GABAPENTIN 300 MG PO CAPS: 900.0000 mg | ORAL_CAPSULE | Freq: Three times a day (TID) | ORAL | 1 refills | Status: DC | PRN

## 2022-05-01 MED ORDER — AMLODIPINE BESYLATE 10 MG PO TABS: 10.0000 mg | ORAL_TABLET | Freq: Every day | ORAL | 3 refills | Status: DC

## 2022-05-01 NOTE — Progress Notes (Deleted)
Family Medicine Teaching Service Daily Progress Note Intern Pager: [phone]  Patient name: Megan Dixon Medical record number: [id number] Date of birth: 24-Jul-1966 Age: 56 y.o. Gender: female  Primary Care Provider: Gifford Shave, MD Consultants: *** Code Status: ***  Pt Overview and Major Events to Date:  ***  Assessment and Plan:  ***  FEN/GI: *** PPx: *** Dispo:{FPTSDISPOLIST:25765} {FPTSDISPOTIME:25766}. Barriers include ***.   Subjective:  ***  Objective: '@VSRANGES'$ @ Physical Exam: General: *** Cardiovascular: *** Respiratory: *** Abdomen: *** Extremities: ***  Laboratory: No results for input(s): WBC, HGB, HCT, PLT in the last 168 hours. No results for input(s): NA, K, CL, CO2, BUN, CREATININE, CALCIUM, PROT, BILITOT, ALKPHOS, ALT, AST, GLUCOSE in the last 168 hours.  Invalid input(s): LABALBU  ***  Imaging/Diagnostic Tests: Precious Gilding, DO 05/01/2022, 1:47 PM PGY-***, [place] Intern pager: [phone], text pages welcome

## 2022-05-01 NOTE — Progress Notes (Cosign Needed Addendum)
    SUBJECTIVE:   CHIEF COMPLAINT / HPI:   Chronic pain Pt presents with chronic upper and lower extremity pain d/t fibromyalgia. She describes the pain as and ache over the entirety of her BLEs and BUEs and it burns in in shoulders. She takes flexeril 10 mg TID regularly, she is prescribed gabapentin 300 mg 2 capsules in the morning and afternoon and 3 at night but she has been taking 3 capsules TID for the past 2 months and has ran out. She states she did have some improvement in the pain with increasing the gabapentin. She is also prescribed lidocaine ointment which does not help. She takes ibuprofen 400 mg TID since she ran out of gabapentin about 2 weeks ago. Before that she was taking ibuprofen BID.  The ibuprofen does not help as much is the gabapentin and she would like a refill.  She is starting a new class at the [facility] and would like to remain active but needs to decrease her pain to do so.  Hypertension Patient currently takes amlodipine 5 mg daily, and losartan 50 mg daily.  She did take her medications today.  States her blood pressure at home was high, was able to tell me as 150s/101.  She takes her blood pressure regularly and states it has been higher since she has been out of the gabapentin and in more pain.   PERTINENT  PMH / PSH: Fibromyalgia, hypertension  OBJECTIVE:   Vitals:   05/01/22 1423 05/01/22 1501  BP: (!) 147/101 (!) 153/95  Pulse: 96   SpO2: 97%      General: NAD, pleasant, able to participate in exam Cardiac: Well perfused Respiratory: Breathing comfortably on room air Extremities: 5/5 muscle strength in BLEs, 4/5 muscle strength in the BUEs due to pain Neuro: alert, no obvious focal deficits Psych: Normal affect and mood  ASSESSMENT/PLAN:   Fibromyalgia Today I prescribed gabapentin 300 mg 3 tablets 3 times a day as needed.  Patient was advised that since she has not taken gabapentin for 2 weeks to start with only 1 tablet 3 times daily and can  increase as needed for pain.  She was advised that the gabapentin could make her feel drowsy.  She was also advised to decrease the amount of ibuprofen she is taking on a regular basis as it can lead to stomach upset/ulcers but that it is okay to take it short-term when needed.  Hypertension Blood pressure was 147/101 with recheck of 153/95.  -Increase amlodipine to 10 mg daily -Continue losartan 50 mg daily -Check home blood pressures and record them -Return in 1 month with home blood pressure cuff for blood pressure check -CMP  Health maintenance -Mammogram ordered -Hep C screening ordered   Dr. Precious Gilding, [place]

## 2022-05-01 NOTE — Patient Instructions (Signed)
It was great to see you! Thank you for allowing me to participate in your care!  I recommend that you always bring your medications to each appointment as this makes it easy to ensure you are on the correct medications and helps [place] not miss when refills are needed.  Our plans for today:  -I have increased your prescription for gabapentin to 900 mg (3 tablets) 3 times a day as needed for pain.  As he had not been taking gabapentin for 2 weeks, I strongly recommend only taking 1 tablet 3 times a day to start and increase by adding a tablet to each dose only as needed as gabapentin can make you very drowsy. -Due to your elevated blood pressure, I have increased your amlodipine to 10 mg daily.  Please check your blood pressures at home and write them down and bring them to next appointment.  Please schedule a follow-up appointment to check blood pressure in 1 month. -I have ordered a mammogram at the breast center, please call to schedule an appointment.  We are checking some labs today, I will call you if they are abnormal will send you a MyChart message or a letter if they are normal.  If you do not hear about your labs in the next 2 weeks please let us know.  Take care and seek immediate care sooner if you develop any concerns.   Dr. Precious Gilding, DO [facility]

## 2022-05-01 NOTE — Assessment & Plan Note (Signed)
Gabapentin 300 mg, take 3 tablets 3 times a day as needed.  Patient was advised that she has not taken gabapentin for 2 weeks to start with only 1 tablet 3 times daily and can increase as needed for pain.  She was advised that the gabapentin could make her feel drowsy.  She was also advised to decrease the amount of ibuprofen she is taking on a regular basis as it can lead to stomach upset/ulcers but that it is okay to take it short-term when needed.

## 2022-05-01 NOTE — Telephone Encounter (Signed)
Patient calls nurse line requesting a refill on Gabapentin.   Patient reports she is unable to get additional refills from pharmacy due to insurance not covering.   Patient reports she has been having increased pain and taking more Gabapentin than prescribed.   Patient reports taking '900mg'$  TID.   Patient advised to schedule an apt. Patient scheduled for this afternoon.

## 2022-05-02 LAB — COMPREHENSIVE METABOLIC PANEL
ALT: 19 IU/L (ref 0–32)
AST: 20 IU/L (ref 0–40)
Albumin/Globulin Ratio: 1.3 (ref 1.2–2.2)
Albumin: 4.1 g/dL (ref 3.8–4.9)
Alkaline Phosphatase: 135 IU/L — ABNORMAL HIGH (ref 44–121)
BUN/Creatinine Ratio: 10 (ref 9–23)
BUN: 7 mg/dL (ref 6–24)
Bilirubin Total: <0.2 mg/dL (ref 0.0–1.2)
CO2: 27 mmol/L (ref 20–29)
Calcium: 9.1 mg/dL (ref 8.7–10.2)
Chloride: 101 mmol/L (ref 96–106)
Creatinine, Ser: 0.73 mg/dL (ref 0.57–1.00)
Globulin, Total: 3.1 g/dL (ref 1.5–4.5)
Glucose: 108 mg/dL — ABNORMAL HIGH (ref 70–99)
Potassium: 3 mmol/L — ABNORMAL LOW (ref 3.5–5.2)
Sodium: 143 mmol/L (ref 134–144)
Total Protein: 7.2 g/dL (ref 6.0–8.5)
eGFR: 95 mL/min/{1.73_m2} (ref >59)

## 2022-05-02 LAB — HCV AB W REFLEX TO QUANT PCR: HCV Ab: NONREACTIVE

## 2022-05-02 LAB — HCV INTERPRETATION

## 2022-05-03 ENCOUNTER — Encounter: Payer: Self-pay | Admitting: Student

## 2022-05-03 MED ORDER — POTASSIUM CHLORIDE CRYS ER 20 MEQ PO TBCR: 20.0000 meq | EXTENDED_RELEASE_TABLET | Freq: Three times a day (TID) | ORAL | 0 refills | Status: DC

## 2022-05-03 NOTE — Progress Notes (Addendum)
60 mEq potassium sent to pharmacy d/t low potassium of 3. Attempted to call pt with results, did not pick up and VM not set up. Will try again tomorrow.

## 2022-05-04 ENCOUNTER — Telehealth: Payer: Self-pay

## 2022-05-04 ENCOUNTER — Telehealth: Payer: Self-pay | Admitting: Cardiology

## 2022-05-04 MED ORDER — POTASSIUM CHLORIDE CRYS ER 20 MEQ PO TBCR: 20.0000 meq | EXTENDED_RELEASE_TABLET | Freq: Three times a day (TID) | ORAL | 0 refills | Status: DC

## 2022-05-04 NOTE — Telephone Encounter (Signed)
Transferred call from phone operators. Patient having lots of palpitations, lightheadedness and shortness of breath. She saw Dr. Ronnald Ramp with [facility]- patient had labs and was told to take potassium TID.   All her symptoms run together at times, occurring all day. This has been going on for two months- feels lightheaded  and room spinning sensations.   RN scheduled patient for appointment with Dr. Harrell Gave on May 26th at 2:20pm, patient counseled to report to the ED if symptoms return or worsen before appointment    "Patient c/o Palpitations:  High priority if patient c/o lightheadedness, shortness of breath, or chest pain   How long have you had palpitations/irregular HR/ Afib? Are you having the symptoms now? 2 months; yes   Are you currently experiencing lightheadedness, SOB or CP? Chest pains, SOB, pre-syncope   Do you have a history of afib (atrial fibrillation) or irregular heart rhythm? No    Have you checked your BP or HR? (document readings if available): No    Are you experiencing any other symptoms? Electric shots through chest                                                         "

## 2022-05-04 NOTE — Telephone Encounter (Signed)
Patient calls nurse line in regard to Potassium prescription.   Prescription was "set to print."   Will resend now.

## 2022-05-04 NOTE — Telephone Encounter (Signed)
Patient c/o Palpitations:  High priority if patient c/o lightheadedness, shortness of breath, or chest pain  How long have you had palpitations/irregular HR/ Afib? Are you having the symptoms now? 2 months; yes  Are you currently experiencing lightheadedness, SOB or CP? Chest pains, SOB, pre-syncope  Do you have a history of afib (atrial fibrillation) or irregular heart rhythm? No   Have you checked your BP or HR? (document readings if available): No   Are you experiencing any other symptoms? Electric shots through chest

## 2022-05-05 ENCOUNTER — Telehealth: Payer: Self-pay | Admitting: Student

## 2022-05-05 NOTE — Telephone Encounter (Signed)
Spoke with pt on the phone on evening of 5/21 regarding negative hep C test and CMP results showing K of 3. Sent in Potassium 60 mEq to pts pharmacy for one time dose and advised pt to call cardiologist to schedule apt for this week to recheck K and discuss need to supplementation while she is on Lasix. She had to cancel her last apt with them in January and never rescheduled so she is due to follow up with them.   Advised if she cannot get in with them in the next week to 2 weeks, to make an apt with [place] for this week to recheck K.

## 2022-05-06 ENCOUNTER — Ambulatory Visit (HOSPITAL_BASED_OUTPATIENT_CLINIC_OR_DEPARTMENT_OTHER): Payer: Medicare Other | Admitting: Cardiology

## 2022-05-08 ENCOUNTER — Ambulatory Visit: Payer: Medicare Other

## 2022-05-08 ENCOUNTER — Ambulatory Visit (INDEPENDENT_AMBULATORY_CARE_PROVIDER_SITE_OTHER): Payer: Medicare Other | Admitting: Cardiology

## 2022-05-08 ENCOUNTER — Ambulatory Visit (INDEPENDENT_AMBULATORY_CARE_PROVIDER_SITE_OTHER): Payer: Medicare Other

## 2022-05-08 VITALS — BP 154/93 | HR 97 | Ht 61.0 in | Wt 308.6 lb

## 2022-05-08 DIAGNOSIS — Z7189 Other specified counseling: Secondary | ICD-10-CM

## 2022-05-08 DIAGNOSIS — R002 Palpitations: Secondary | ICD-10-CM

## 2022-05-08 DIAGNOSIS — I10 Essential (primary) hypertension: Secondary | ICD-10-CM | POA: Diagnosis not present

## 2022-05-08 DIAGNOSIS — Z8673 Personal history of transient ischemic attack (TIA), and cerebral infarction without residual deficits: Secondary | ICD-10-CM | POA: Diagnosis not present

## 2022-05-08 NOTE — Patient Instructions (Signed)
Medication Instructions:  Your Physician recommend you continue on your current medication as directed.    *If you need a refill on your cardiac medications before your next appointment, please call your pharmacy*   Lab Work: None ordered today   Testing/Procedures: Your physician has recommended that you wear a 7 Zio monitor.   This monitor is a medical device that records the heart's electrical activity. Doctors most often use these monitors to diagnose arrhythmias. Arrhythmias are problems with the speed or rhythm of the heartbeat. The monitor is a small device applied to your chest. You can wear one while you do your normal daily activities. While wearing this monitor if you have any symptoms to push the button and record what you felt. Once you have worn this monitor for the period of time provider prescribed (Usually 14 days), you will return the monitor device in the postage paid box. Once it is returned they will download the data collected and provide [place] with a report which the provider will then review and we will call you with those results. Important tips:  Avoid showering during the first 24 hours of wearing the monitor. Avoid excessive sweating to help maximize wear time. Do not submerge the device, no hot tubs, and no swimming pools. Keep any lotions or oils away from the patch. After 24 hours you may shower with the patch on. Take brief showers with your back facing the shower head.  Do not remove patch once it has been placed because that will interrupt data and decrease adhesive wear time. Push the button when you have any symptoms and write down what you were feeling. Once you have completed wearing your monitor, remove and place into box which has postage paid and place in your outgoing mailbox.  If for some reason you have misplaced your box then call our office and we can provide another box and/or mail it off for you.      Follow-Up: At [facility], you and your  health needs are our priority.  As part of our continuing mission to provide you with exceptional heart care, we have created designated Provider Care Teams.  These Care Teams include your primary Cardiologist (physician) and Advanced Practice Providers (APPs -  Physician Assistants and Nurse Practitioners) who all work together to provide you with the care you need, when you need it.  We recommend signing up for the patient portal called "MyChart".  Sign up information is provided on this After Visit Summary.  MyChart is used to connect with patients for Virtual Visits (Telemedicine).  Patients are able to view lab/test results, encounter notes, upcoming appointments, etc.  Non-urgent messages can be sent to your provider as well.   To learn more about what you can do with MyChart, go to [email or web address].    Your next appointment:   6 week(s)  The format for your next appointment:   In Person  Provider:   Buford Dresser, MD{

## 2022-05-08 NOTE — Progress Notes (Signed)
Cardiology Office Note:    Date:  05/08/2022   ID:  Megan Dixon, DOB March 22, 1966, MRN [id number]  PCP:  Gifford Shave, MD  Cardiologist:  Buford Dresser, MD PhD  Referring MD: Dr. Gwyndolyn Saxon Hensel/Dr. Christy Sartorius Cresenzo(family medicine)  CC: follow up  History of Present Illness:    Megan Dixon is a 56 y.o. female with a hx of hypertension, obesity, vertigo, prior cerebellar infarction who is seen for follow up today.   I initially met her 09/22/2018 in the hospital  when she was admitted for dizziness and intermittent chest pain. Her blood pressure was also poorly controlled. She was treated for central vertigo and BPPV, and her blood pressure was optimized by weaning clonidine and starting/titrating amlodipine, carvedilol, and losartan. She was also recommended to treat her OSA. She had occasional flutters during her admission, without clear arrhythmia. Recommendation was for monitor placement for further evaluation. She was also started on clopidogrel and atorvastatin for evidence of prior small CVA on imaging.  Last appointment:  She endorsed rare palpitations, residual fatigue and leg aches from COVID, poor sleep, and rare shortness of breath. She lost weight, and successfully quit smoking.  Today:  She has daily palpitations, nausea and shortness of breath. She feels intense pressure in her chest during these episodes. Her chest is very tender. If she props herself up in bed or sits up, it helps alleviate the symptoms. She experiences fatigue, because she cannot sleep through the night. She often feels them as she's lying on her back trying to fall asleep.  She experiences sudden random pains that feel like "electric shocks" that run through her body. Her tongue often feels thick while this happens. The severity of the pain can cause her to fall. Recently, she chipped a tooth while falling after a "shock."  She checks her blood pressure at home, and noticed it has been  running high recently. She says that she is not currently stressed.  Patient quit smoking, and continues not to smoke.   She tries to walk, but because of her symptoms, she often is not able to because she falls down. Recently she has had 5 falls. She develops electrical sensations that knock her off balance.   She currently plans to try and swim for exercise.   She does not eat often, but still does not lose weight. She was interested in taking Ozempic, but does not currently qualify.   She was compliant with her potassium pills as instructed, taking three in one day.  She denies any peripheral edema. No lightheadedness, headaches, syncope, orthopnea, or PND.    Past Medical History:  Diagnosis Date   Arthritis    Depression    Fibromyalgia    Hypertension    Lupus ([place])    a. was told bloodwork was possibly positive for lupus in 08/2018 by PCP.   Morbid obesity ([place])    MS (multiple sclerosis) ([place])    a. presumptive dx in [place], [place]   OCD (obsessive compulsive disorder)    OSA (obstructive sleep apnea)    a. dx recently, pending CPAP initiation.   Palpitations    Pneumonia 07/2018   left lung    Past Surgical History:  Procedure Laterality Date   ABDOMINAL SURGERY     BREAST EXCISIONAL BIOPSY Right    CARPAL TUNNEL RELEASE     HERNIA REPAIR     JOINT REPLACEMENT     VENTRAL HERNIA REPAIR N/A 08/09/2017   Procedure: Incarcerated ventral hernia repair  with mesh ;  Surgeon: Rolm Bookbinder, MD;  Location: [facility];  Service: General;  Laterality: N/A;    Current Medications: Current Outpatient Medications on File Prior to Visit  Medication Sig   albuterol (VENTOLIN HFA) 108 (90 Base) MCG/ACT inhaler Inhale 1-2 puffs into the lungs every 6 (six) hours as needed for wheezing or shortness of breath.   amLODipine (NORVASC) 10 MG tablet Take 1 tablet (10 mg total) by mouth at bedtime.   atorvastatin (LIPITOR) 40 MG tablet TAKE 1 TABLET BY MOUTH EVERY DAY    benzonatate (TESSALON PERLES) 100 MG capsule Take 1 capsule (100 mg total) by mouth 3 (three) times daily as needed for cough.   carvedilol (COREG) 6.25 MG tablet TAKE 1 TABLET BY MOUTH 2 TIMES DAILY WITH A MEAL.   clopidogrel (PLAVIX) 75 MG tablet TAKE 1 TABLET BY MOUTH EVERY DAY   cyclobenzaprine (FLEXERIL) 10 MG tablet TAKE 1 TABLET BY MOUTH THREE TIMES A DAY AS NEEDED FOR MUSCLE SPASMS   Elastic Bandages & Supports (MEDICAL COMPRESSION STOCKINGS) MISC 1 Container by Does not apply route daily. Apply to both legs daily   FLUoxetine (PROZAC) 20 MG capsule Take 3 capsules (60 mg total) by mouth daily.   furosemide (LASIX) 40 MG tablet TAKE 1 TABLET BY MOUTH EVERY DAY   gabapentin (NEURONTIN) 300 MG capsule Take 3 capsules (900 mg total) by mouth 3 (three) times daily as needed.   lidocaine (XYLOCAINE) 5 % ointment Apply 1 application topically 4 (four) times daily as needed.   losartan (COZAAR) 50 MG tablet TAKE 1 TABLET BY MOUTH EVERY DAY   meclizine (ANTIVERT) 25 MG tablet Take 1 tablet (25 mg total) by mouth 3 (three) times daily as needed for dizziness.   ondansetron (ZOFRAN ODT) 4 MG disintegrating tablet Take 1 tablet (4 mg total) by mouth every 8 (eight) hours as needed for nausea or vomiting.   pantoprazole (PROTONIX) 40 MG tablet Take 1 tablet (40 mg total) by mouth daily.   potassium chloride SA (KLOR-CON M) 20 MEQ tablet Take 1 tablet (20 mEq total) by mouth 3 (three) times daily for 1 day.   Current Facility-Administered Medications on File Prior to Visit  Medication   betamethasone acetate-betamethasone sodium phosphate (CELESTONE) injection 12 mg   conjugated estrogens (PREMARIN) vaginal cream 1 Applicatorful     Allergies:   Morphine and related, Other, Peanut-containing drug products, and Toradol [ketorolac tromethamine]   Social History   Tobacco Use   Smoking status: Former    Types: Cigarettes   Smokeless tobacco: Never   Tobacco comments:    Smoked for 40 years,  quit in 08/2018  Vaping Use   Vaping Use: Never used  Substance Use Topics   Alcohol use: Not Currently   Drug use: Never    Family History: The patient's family history includes Cancer in her mother and sister; Heart attack in her brother; Lung cancer in her brother and father; Lupus in her sister; Seizures in her brother.  ROS:   Please see the history of present illness.    (+) Shortness of breath (+) Palpitations (+) Nausea (+) Fatigue (+) Weight Gain (+) Falls  Additional ROS otherwise unremarkable.   EKGs/Labs/Other Studies Reviewed:    The following studies were reviewed today:  LE Venous Doppler 10/13/21:  Summary:  RIGHT:  - There is no evidence of deep vein thrombosis in the lower extremity.  - There is no evidence of superficial venous thrombosis.     -  No cystic structure found in the popliteal fossa.     LEFT:  - No evidence of common femoral vein obstruction.    Monitor 11/11/20  4 days of data recorded on Zio monitor. Patient had a min HR of 48 bpm, max HR of 138 bpm, and avg HR of 102 bpm. Predominant underlying rhythm was Sinus Rhythm. No VT, atrial fibrillation, high degree block, or pauses noted. 6 Supraventricular Tachycardia runs noted, but at least one of these events was actually sinus tachycardia. Events were brief and self limited. Isolated atrial ectopy was rare (<1%), and isolate ventricular ectopy was occasional (1.3%). There were 2 triggered events, which were sinus with ectopy. No high risk arrhythmias detected.  Echo 08/09/20  1. Left ventricular ejection fraction, by estimation, is 50 to 55%. The  left ventricle has low normal function. The left ventricle has no regional  wall motion abnormalities. Left ventricular diastolic parameters are  consistent with Grade I diastolic  dysfunction (impaired relaxation).   2. Right ventricular systolic function is normal. The right ventricular  size is normal.   3. The mitral valve is normal in  structure. No evidence of mitral valve  regurgitation. No evidence of mitral stenosis.   4. The aortic valve is normal in structure. Aortic valve regurgitation is  not visualized. No aortic stenosis is present.   5. The inferior vena cava is normal in size with greater than 50%  respiratory variability, suggesting right atrial pressure of 3 mmHg.   Monitor 11/15/2018 8 days of available data on Zio monitor. Patient had a min HR of 69 bpm, max HR of 136 bpm, and avg HR of 102 bpm. Predominant underlying rhythm was Sinus Rhythm. 1 run of Ventricular Tachycardia occurred lasting 4 beats with a max rate of 120 bpm (avg 111 bpm). Isolated SVEs were rare (<1.0%), and no SVE Couplets or SVE Triplets were present. Isolated VEs were rare (<1.0%), VE Couplets were rare (<1.0%), and no VE Triplets were present. Ventricular Bigeminy was present, duration 3.6 seconds. 4 triggered events, all sinus rhythm or sinus with ventricular ectopy. No atrial fibrillation, pauses, or high degree AV block.   Echo 09/20/18 Study Conclusions  - Left ventricle: The cavity size was mildly dilated. Wall   thickness was increased in a pattern of mild LVH. Systolic   function was mildly reduced. The estimated ejection fraction was   in the range of 45% to 50%. Diffuse hypokinesis. Doppler   parameters are consistent with abnormal left ventricular   relaxation (grade 1 diastolic dysfunction).   Impressions:  - Mild global reduction in LV systolic function (EF 50); mild LVE;   mild LVH; mild diastolic dysfunction.  EKG:    05/08/22: NSR at 97 bpm, PRWP 02/25/21: sinus tachycardia at 106 bpm.  Recent Labs: 05/01/2022: ALT 19; BUN 7; Creatinine, Ser 0.73; Potassium 3.0; Sodium 143  Recent Lipid Panel    Component Value Date/Time   CHOL 118 09/30/2020 1434   TRIG 128 09/30/2020 1434   HDL 30 (L) 09/30/2020 1434   CHOLHDL 3.9 09/30/2020 1434   CHOLHDL 5.4 09/18/2018 1850   VLDL 26 09/18/2018 1850   LDLCALC 65 09/30/2020  1434    Physical Exam:    VS:  BP (!) 154/93 (BP Location: Left Arm, Patient Position: Sitting, Cuff Size: Normal)   Pulse 97   Ht 5' 1" (1.549 m)   Wt (!) 308 lb 9.6 oz (140 kg)   BMI 58.31 kg/m     Wt Readings  from Last 3 Encounters:  05/08/22 (!) 308 lb 9.6 oz (140 kg)  05/01/22 298 lb (135.2 kg)  10/08/21 (!) 316 lb (143.3 kg)    GEN: Well nourished, well developed in no acute distress HEENT: Normal, moist mucous membranes NECK: No JVD CARDIAC: regular rhythm, normal S1 and S2, no rubs or gallops. No murmur. VASCULAR: Radial and DP pulses 2+ bilaterally. No carotid bruits RESPIRATORY:  Clear to auscultation without rales, wheezing or rhonchi  ABDOMEN: Soft, non-tender, non-distended MUSCULOSKELETAL:  Ambulates independently SKIN: Warm and dry, trivial bilateral LE edema NEUROLOGIC:  Alert and oriented x 3. No focal neuro deficits noted. PSYCHIATRIC:  Normal affect   ASSESSMENT:    1. Palpitation   2. Morbid obesity ([place])   3. History of CVA (cerebrovascular accident)   4. Essential hypertension   5. Counseling on health promotion and disease prevention     PLAN:    Palpitations Sensation of "shocks": -prior monitor did not show significant arrhythmias linked to her symptoms -reviewed other possible etiologies of palpitations beyond arrhythmia -she would like to repeat monitor as she is feeling worse, ordered today -counseled on red flag warning signs that need immediate medical attention  Hypertension: elevated today -continue amlodipine 10 mg, carvedilol 6.25 mg BID, losartan 50 mg daily -discussed home BP monitoring -re-assess at close follow up -counseled that with continued weight loss, BP should improve.  Chronic diastolic heart failure, chronic LE edema -continue furosemide daily, as this has improved her swelling significantly -grade 1 diastolic dysfunction on echo  Morbid obesity: BMI 58 -has regained some of the weight she lost, working to get  back on track -she would be a good candidate for GLP1RA with her BMI and comorbidities. Unfortunately, these medications are currently not well covered by Medicare, and she does not have a diabetes diagnosis. When coverage improves, would re-evaluate -she is working on lifestyle changes and has lost some weight, congratulated -she will continue to work on this, offered support -will refer her to [facility], may benefit from pool exercise  OSA: continue CPAP use  History of CVA: -on atorvastatin 40 mg daily -on clopidogrel daily  CV risk counseling and prevention: -recommend heart healthy/Mediterranean diet, with whole grains, fruits, vegetable, fish, lean meats, nuts, and olive oil. Limit salt. -recommend moderate walking, 3-5 times/week for 30-50 minutes each session. Aim for at least 150 minutes.week. Goal should be pace of 3 miles/hours, or walking 1.5 miles in 30 minutes -recommend avoidance of tobacco products. Avoid excess alcohol.  Plan for follow up: 6 weeks  Medication Adjustments/Labs and Tests Ordered: Current medicines are reviewed at length with the patient today.  Concerns regarding medicines are outlined above.  Orders Placed This Encounter  Procedures   Ambulatory referral to [facility] (3-14 DAYS)   EKG 12-Lead   No orders of the defined types were placed in this encounter.   Patient Instructions  Medication Instructions:  Your Physician recommend you continue on your current medication as directed.    *If you need a refill on your cardiac medications before your next appointment, please call your pharmacy*   Lab Work: None ordered today   Testing/Procedures: Your physician has recommended that you wear a 7 Zio monitor.   This monitor is a medical device that records the heart's electrical activity. Doctors most often use these monitors to diagnose arrhythmias. Arrhythmias are problems with the speed or rhythm of the  heartbeat. The monitor is a small device applied  to your chest. You can wear one while you do your normal daily activities. While wearing this monitor if you have any symptoms to push the button and record what you felt. Once you have worn this monitor for the period of time provider prescribed (Usually 14 days), you will return the monitor device in the postage paid box. Once it is returned they will download the data collected and provide [place] with a report which the provider will then review and we will call you with those results. Important tips:  Avoid showering during the first 24 hours of wearing the monitor. Avoid excessive sweating to help maximize wear time. Do not submerge the device, no hot tubs, and no swimming pools. Keep any lotions or oils away from the patch. After 24 hours you may shower with the patch on. Take brief showers with your back facing the shower head.  Do not remove patch once it has been placed because that will interrupt data and decrease adhesive wear time. Push the button when you have any symptoms and write down what you were feeling. Once you have completed wearing your monitor, remove and place into box which has postage paid and place in your outgoing mailbox.  If for some reason you have misplaced your box then call our office and we can provide another box and/or mail it off for you.      Follow-Up: At [facility], you and your health needs are our priority.  As part of our continuing mission to provide you with exceptional heart care, we have created designated Provider Care Teams.  These Care Teams include your primary Cardiologist (physician) and Advanced Practice Providers (APPs -  Physician Assistants and Nurse Practitioners) who all work together to provide you with the care you need, when you need it.  We recommend signing up for the patient portal called "MyChart".  Sign up information is provided on this After Visit Summary.  MyChart is used to  connect with patients for Virtual Visits (Telemedicine).  Patients are able to view lab/test results, encounter notes, upcoming appointments, etc.  Non-urgent messages can be sent to your provider as well.   To learn more about what you can do with MyChart, go to [email or web address].    Your next appointment:   6 week(s)  The format for your next appointment:   In Person  Provider:   Buford Dresser, MD{           I,Mathew Stumpf,acting as a scribe for Buford Dresser, MD.,have documented all relevant documentation on the behalf of Buford Dresser, MD,as directed by  Buford Dresser, MD while in the presence of Buford Dresser, MD.   I, Buford Dresser, MD, have reviewed all documentation for this visit. The documentation on 05/19/22 for the exam, diagnosis, procedures, and orders are all accurate and complete.   Signed, Buford Dresser, MD PhD 05/08/2022  [place]

## 2022-05-14 ENCOUNTER — Telehealth: Payer: Self-pay | Admitting: Physical Medicine and Rehabilitation

## 2022-05-14 NOTE — Telephone Encounter (Signed)
Pt called and states she is still having low back pain, it has actually gotten worse. Can she come in to get an injection or OV?   Cb [phone]

## 2022-05-15 ENCOUNTER — Ambulatory Visit: Admission: RE | Admit: 2022-05-15 | Payer: Medicare Other | Source: Ambulatory Visit

## 2022-05-15 ENCOUNTER — Ambulatory Visit
Admission: RE | Admit: 2022-05-15 | Discharge: 2022-05-15 | Disposition: A | Discharge status: Home / Self Care | Payer: Medicare Other | Source: Ambulatory Visit | Attending: Family Medicine | Admitting: Family Medicine

## 2022-05-15 ENCOUNTER — Ambulatory Visit (INDEPENDENT_AMBULATORY_CARE_PROVIDER_SITE_OTHER): Payer: Medicare Other | Admitting: Physical Medicine and Rehabilitation

## 2022-05-15 ENCOUNTER — Encounter: Payer: Self-pay | Admitting: Physical Medicine and Rehabilitation

## 2022-05-15 DIAGNOSIS — M7918 Myalgia, other site: Secondary | ICD-10-CM

## 2022-05-15 DIAGNOSIS — G894 Chronic pain syndrome: Secondary | ICD-10-CM

## 2022-05-15 DIAGNOSIS — Z1231 Encounter for screening mammogram for malignant neoplasm of breast: Secondary | ICD-10-CM

## 2022-05-15 DIAGNOSIS — M797 Fibromyalgia: Secondary | ICD-10-CM | POA: Diagnosis not present

## 2022-05-15 MED ORDER — NORTRIPTYLINE HCL 10 MG PO CAPS: 10.0000 mg | ORAL_CAPSULE | Freq: Every day | ORAL | 0 refills | Status: DC

## 2022-05-15 NOTE — Progress Notes (Signed)
Megan Dixon - 56 y.o. female MRN [id number]  Date of birth: 1966/06/02  Office Visit Note: Visit Date: 05/15/2022 PCP: Gifford Shave, MD Referred by: Gifford Shave, MD  Subjective: Chief Complaint  Patient presents with   Lower Back - Pain   Right Leg - Pain   Left Leg - Pain   HPI: Megan Dixon is a 56 y.o. female who comes in today for evaluation of chronic, worsening and severe pain to upper, middle and lower back regions. Patient states pain has been ongoing for several years and is constant in nature. States pain spans entirety of back and does frequently radiate to head, buttocks and hip regions. Patient reports pain becomes worse with movement, activity and walking, currently rates as 8 out of 10. Patient reports some relief of pain with home exercise regimen, rest and use of medications. Patient is currently taking Gabapentin daily and Flexeril as needed for muscle spasms. Patients lumbar MRI from 2021 does not show any advanced findings, there is mild curvature convex to the left with the apex at L2-L3, mild multi-level facet changes, no nerve compression/spinal canal stenosis noted. Patient has history of 2 lumbar injections performed in our office, left L5-S1 interlaminar epidural steroid injection in 2021 and right L5-S1 intra-articular facet joint injections in 2022. Patient reports little to no relief of pain with these procedures. Patient states her pain is negatively impacting her daily life, states difficulty performing daily tasks such as cleaning and grocery shopping due to severe discomfort. Patient states she is more sedentary and is not motivated to stay active due to chronic pain. Patient does have plans to start water aerobics at [employer] at [facility] and also reports she recently joined gym. Patient is currently using cane to assist with ambulation and prevent falls. Patient denies focal weakness, numbness and tingling. Patient denies recent  trauma or falls.   Review of Systems  Cardiovascular:        Pt reports chronic swelling to bilateral lower extremities.   Musculoskeletal:  Positive for back pain and myalgias.  Neurological:  Negative for tingling, sensory change, focal weakness and weakness.  All other systems reviewed and are negative. Otherwise per HPI.  Assessment & Plan: Visit Diagnoses:    ICD-10-CM   1. Fibromyalgia  M79.7     2. Myofascial pain syndrome  M79.18     3. Chronic pain syndrome  G89.4     4. Morbid (severe) obesity due to excess calories (HCC)  E66.01        Plan: Findings:  Chronic, worsening and severe pain to upper, middle and lower back regions with intermittent radiation to head, buttock and hip regions. Patient continues to have severe pain despite good conservative therapies such as home exercise regimen, rest and use of medications. Patients clinical presentation and exam are consistent with myofascial pain, she does have multiple palpable trigger points to bilateral paraspinal regions. I also believe her fibromyalgia is working to exacerbate her pain. Patient had 2 prior lumbar injections performed in our office that provided little to no relief of pain, at this point I do not feel it would be beneficial to repeat these injections, lumbar MRI findings from 2021 do not directly correlate with her symptoms, there are no advanced findings and her lumbar spine looks good for her age. I did discuss lumbar MRI from 2021 in detail today using images and spine model. I talked in detail about medication management with patient and feel she could benefit from  Nortriptyline once a day at bedtime, I did place prescription today. I also discussed fibromyalgia management and emphasized importance of stress reduction, good sleep habits, exercise and balanced diet. I encouraged patient to develop a routine and to move forward with plans for water aerobics and exercise regimen. I would like patient to follow up  with me in approximately 4 weeks. No red flag symptoms noted upon exam.    Meds & Orders: No orders of the defined types were placed in this encounter.  No orders of the defined types were placed in this encounter.   Follow-up: Return for 4 week follow up for re-evaluation.   Procedures: No procedures performed      Clinical History: EXAM: MRI LUMBAR SPINE WITHOUT CONTRAST   TECHNIQUE: Multiplanar, multisequence MR imaging of the lumbar spine was performed. No intravenous contrast was administered.   COMPARISON:  None.   FINDINGS: Segmentation:  5 lumbar type vertebral bodies assumed.   Alignment: Mild curvature convex to the left with the apex at L2-3. Somewhat exaggerated lumbar lordosis.   Vertebrae:  No fracture or primary bone lesion.   Conus medullaris and cauda equina: Conus extends to the L1-2 level. Conus and cauda equina appear normal.   Paraspinal and other soft tissues: Negative   Disc levels:   No abnormality at L1-2 or above.   L2-3: Mild desiccation and minimal bulging of the disc. Mild facet and ligamentous prominence. No compressive stenosis.   L3-4: Desiccation and mild bulging of the disc. Mild facet and ligamentous prominence. No compressive stenosis.   L4-5: Minimal bulging of the disc. Mild facet and ligamentous prominence. No compressive stenosis.   L5-S1: Normal appearance of the disc. Mild facet and ligamentous prominence. Facet degeneration worse on the left, which could contribute to low back pain. No compressive stenosis.   IMPRESSION: 1. No advanced finding in the lumbar region. Mild curvature convex to the left with the apex at L2-3. Somewhat exaggerated lumbar lordosis. 2. Mild degenerative disc disease and degenerative facet disease at L2-3, L3-4 and L4-5 with minimal disc bulges. No compressive stenosis of the canal or foramina. 3. 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     Electronically Signed   By: Nelson Chimes M.D.   On: 08/09/2020 15:28   She reports that she has quit smoking. Her smoking use included cigarettes. She has never used smokeless tobacco.  Recent Labs    06/09/21 1057  HGBA1C 6.2*    Objective:  VS:  HT:    WT:   BMI:     BP:   HR: bpm  TEMP: ( )  RESP:  Physical Exam Vitals and nursing note reviewed.  HENT:     Head: Normocephalic and atraumatic.     Right Ear: External ear normal.     Left Ear: External ear normal.     Nose: Nose normal.     Mouth/Throat:     Mouth: Mucous membranes are moist.  Eyes:     Extraocular Movements: Extraocular movements intact.  Cardiovascular:     Rate and Rhythm: Normal rate.     Pulses: Normal pulses.  Pulmonary:     Effort: Pulmonary effort is normal.  Abdominal:     General: Abdomen is flat. There is no distension.  Musculoskeletal:        General: Tenderness present.     Cervical back: Normal range of motion.     Right  lower leg: 1+ Pitting Edema present.     Left lower leg: 1+ Pitting Edema present.     Comments: Pt is slow to rise from seated position to standing. Good lumbar range of motion. Strong distal strength without clonus, no pain upon palpation of greater trochanters. Multiple palpable trigger points noted to bilateral paraspinal regions. Patient is extremely tender to touch/palpation. Sensation intact bilaterally. Ambulates with cane, gait slow and unsteady.  Skin:    General: Skin is warm and dry.     Capillary Refill: Capillary refill takes less than 2 seconds.  Neurological:     General: No focal deficit present.     Mental Status: She is alert and oriented to person, place, and time.  Psychiatric:        Mood and Affect: Mood normal.        Behavior: Behavior normal.    Ortho Exam  Imaging: No results found.  Past Medical/Family/Surgical/Social History: Medications & Allergies reviewed per EMR, new medications updated. Patient Active Problem  List   Diagnosis Date Noted   Congestion of nasal sinus 09/02/2021   Screening for cervical cancer 06/09/2021   Perineal pain in female 06/09/2021   Right wrist pain 01/24/2021   OSA (obstructive sleep apnea)    Palpitation 09/10/2020   Chronic diastolic heart failure ([place]) 09/10/2020   History of CVA (cerebrovascular accident) 09/10/2020   Congestive heart failure ([place]) 08/08/2020   Lower extremity edema 08/08/2020   Lower extremity pain, posterior, left 01/29/2020   Hyperlipidemia 01/09/2020   Depression 01/01/2020   Bilateral lower extremity pain 01/01/2020   Peripheral neuropathy 11/23/2019   Prediabetes 11/23/2019   Axillary lump, right 11/23/2019   Acute vestibular syndrome 08/29/2019   Ischemic vascular disease 08/29/2019   Migraine 08/29/2019   Morbid obesity ([place])    Family history of systemic lupus erythematosus 09/10/2018   Anxiety disorder 09/07/2018   Hypertension 08/01/2018   Fibromyalgia 08/01/2018   Blood-tinged sputum 08/01/2018   Chronic low back pain 08/16/2017   Gastroesophageal reflux disease 08/16/2017   Incarcerated incisional hernia 08/10/2017   S/P exploratory laparotomy 08/09/2017   Past Medical History:  Diagnosis Date   Arthritis    Depression    Fibromyalgia    Hypertension    Lupus ([place])    a. was told bloodwork was possibly positive for lupus in 08/2018 by PCP.   Morbid obesity ([place])    MS (multiple sclerosis) ([place])    a. presumptive dx in [place]   OCD (obsessive compulsive disorder)    OSA (obstructive sleep apnea)    a. dx recently, pending CPAP initiation.   Palpitations    Pneumonia 07/2018   left lung   Family History  Problem Relation Age of Onset   Cancer Mother        Leukemia   Lung cancer Father    Cancer Sister        Leukemia   Lupus Sister    Lung cancer Brother    Seizures Brother    Heart attack Brother        Happened during seizure   Past Surgical History:  Procedure Laterality Date   ABDOMINAL  SURGERY     BREAST EXCISIONAL BIOPSY Right    CARPAL TUNNEL RELEASE     HERNIA REPAIR     JOINT REPLACEMENT     VENTRAL HERNIA REPAIR N/A 08/09/2017   Procedure: Incarcerated ventral hernia repair with mesh ;  Surgeon: Rolm Bookbinder, MD;  Location: [facility];  Service: General;  Laterality: N/A;   Social History   Occupational History   Not on file  Tobacco Use   Smoking status: Former    Types: Cigarettes   Smokeless tobacco: Never   Tobacco comments:    Smoked for 40 years, quit in 08/2018  Vaping Use   Vaping Use: Never used  Substance and Sexual Activity   Alcohol use: Not Currently   Drug use: Never   Sexual activity: Not Currently

## 2022-05-15 NOTE — Progress Notes (Signed)
Pt state lower back pain that travels to buttock and down both legs and feet. Pt state walking, standing and laying down makes the pain worse. Pt state she has to sit down, use heat and takes pain meds to help ease her pain.  Numeric Pain Rating Scale and Functional Assessment Average Pain 10 Pain Right Now 10 My pain is constant, sharp, burning, stabbing, tingling, and aching Pain is worse with: walking, bending, sitting, standing, some activites, and laying down Pain improves with: heat/ice and medication   In the last MONTH (on 0-10 scale) has pain interfered with the following?  1. General activity like being  able to carry out your everyday physical activities such as walking, climbing stairs, carrying groceries, or moving a chair?  Rating(5)  2. Relation with others like being able to carry out your usual social activities and roles such as  activities at home, at work and in your community. Rating(6)  3. Enjoyment of life such that you have  been bothered by emotional problems such as feeling anxious, depressed or irritable?  Rating(7)

## 2022-05-19 ENCOUNTER — Encounter (HOSPITAL_BASED_OUTPATIENT_CLINIC_OR_DEPARTMENT_OTHER): Payer: Self-pay | Admitting: Cardiology

## 2022-05-20 DIAGNOSIS — R002 Palpitations: Secondary | ICD-10-CM | POA: Diagnosis not present

## 2022-06-04 ENCOUNTER — Ambulatory Visit: Payer: Medicare Other | Admitting: Family Medicine

## 2022-06-26 ENCOUNTER — Encounter: Payer: Self-pay | Admitting: Physical Medicine and Rehabilitation

## 2022-06-26 ENCOUNTER — Ambulatory Visit (HOSPITAL_BASED_OUTPATIENT_CLINIC_OR_DEPARTMENT_OTHER): Payer: Medicare Other | Admitting: Cardiology

## 2022-06-26 ENCOUNTER — Ambulatory Visit (INDEPENDENT_AMBULATORY_CARE_PROVIDER_SITE_OTHER): Payer: Medicare Other | Admitting: Physical Medicine and Rehabilitation

## 2022-06-26 VITALS — BP 124/75 | HR 102

## 2022-06-26 DIAGNOSIS — G894 Chronic pain syndrome: Secondary | ICD-10-CM

## 2022-06-26 DIAGNOSIS — M7918 Myalgia, other site: Secondary | ICD-10-CM

## 2022-06-26 DIAGNOSIS — M797 Fibromyalgia: Secondary | ICD-10-CM

## 2022-06-26 MED ORDER — NORTRIPTYLINE HCL 25 MG PO CAPS: 25.0000 mg | ORAL_CAPSULE | Freq: Every day | ORAL | 0 refills | Status: DC

## 2022-06-26 NOTE — Progress Notes (Unsigned)
Megan Dixon - 56 y.o. female MRN [id number]  Date of birth: May 15, 1966  Office Visit Note: Visit Date: 06/26/2022 PCP: Wells Guiles, DO Referred by: Concepcion Living, MD  Subjective: Chief Complaint  Patient presents with   Lower Back - Pain   Right Leg - Pain   Left Leg - Pain   HPI: Megan Dixon is a 56 y.o. female who comes in today for the evaluation of chronic, worsening and severe bilateral lower back pain radiating up back and down legs. Patient reports pain has been ongoing for several years and is exacerbated by movement, activity and standing. She describes pain as a sore, aching, burning and cramping sensation, currently rates as 9 out of 10. Patient reports some relief of pain with home exercise regimen, rest and use of medications. Patient was recently started on Nortriptyline 10 mg by mouth daily, she is also taking Gabapentin and Flexeril. Patients lumbar MRI from 2021 does not show any advanced findings, there is mild curvature convex to the left with the apex at L2-L3, mild multi-level facet changes, no nerve compression/spinal canal stenosis noted. Patient has history of 2 lumbar injections performed in our office, left L5-S1 interlaminar epidural steroid injection in 2021 and right L5-S1 intra-articular facet joint injections in 2022, reports little to no relief with these procedures. Patient states her pain is negatively impacting her daily life. Patient states she is trying to become more active, however she states it is difficult to exercise due to chronic pain, weight and issues with depression. Patient does have plans to start water aerobics at either [employer] at [facility] or [place]. Patient is currently using cane to assist with ambulation and prevent falls. Patient denies focal weakness, numbness and tingling. Patient denies recent trauma or falls.    Review of Systems  Cardiovascular:  Positive for leg swelling.  Musculoskeletal:  Positive  for back pain and myalgias.  Neurological:  Negative for tingling, sensory change, focal weakness and weakness.  All other systems reviewed and are negative.  Otherwise per HPI.  Assessment & Plan: Visit Diagnoses:    ICD-10-CM   1. Myofascial pain syndrome  M79.18     2. Chronic pain syndrome  G89.4     3. Fibromyalgia  M79.7     4. Morbid (severe) obesity due to excess calories (HCC)  E66.01        Plan: Findings:  Chronic, worsening and severe bilateral lower back pain radiating up back and down legs. Patient continues to have severe pain despite good conservative therapies such as home exercise regimen, rest and use of medications. Patients clinical presentation and exam are consistent with myofascial pain syndrome. I also believe her fibromyalgia is working to exacerbate her symptoms. Patient had 2 prior lumbar injections performed in our office that provided little to no relief of pain, at this point I do not feel it would be beneficial to repeat these injections, lumbar MRI findings from 2021 do not directly correlate with her symptoms, there are no advanced findings and her lumbar spine looks good for her age. I discussed medication management with patient today and will increase Nortriptyline to 25 mg by mouth daily. I also discussed fibromyalgia management and emphasized importance of stress reduction, good sleep habits, exercise and balanced diet. I instructed patient to follow up with primary care provider regarding fibromyalgia management. No red flag symptoms noted upon exam today.     Meds & Orders:  Meds ordered this encounter  Medications  nortriptyline (PAMELOR) 25 MG capsule    Sig: Take 1 capsule (25 mg total) by mouth at bedtime.    Dispense:  60 capsule    Refill:  0   No orders of the defined types were placed in this encounter.   Follow-up: Return if symptoms worsen or fail to improve.   Procedures: No procedures performed      Clinical  History: EXAM: MRI LUMBAR SPINE WITHOUT CONTRAST   TECHNIQUE: Multiplanar, multisequence MR imaging of the lumbar spine was performed. No intravenous contrast was administered.   COMPARISON:  None.   FINDINGS: Segmentation:  5 lumbar type vertebral bodies assumed.   Alignment: Mild curvature convex to the left with the apex at L2-3. Somewhat exaggerated lumbar lordosis.   Vertebrae:  No fracture or primary bone lesion.   Conus medullaris and cauda equina: Conus extends to the L1-2 level. Conus and cauda equina appear normal.   Paraspinal and other soft tissues: Negative   Disc levels:   No abnormality at L1-2 or above.   L2-3: Mild desiccation and minimal bulging of the disc. Mild facet and ligamentous prominence. No compressive stenosis.   L3-4: Desiccation and mild bulging of the disc. Mild facet and ligamentous prominence. No compressive stenosis.   L4-5: Minimal bulging of the disc. Mild facet and ligamentous prominence. No compressive stenosis.   L5-S1: Normal appearance of the disc. Mild facet and ligamentous prominence. Facet degeneration worse on the left, which could contribute to low back pain. No compressive stenosis.   IMPRESSION: 1. No advanced finding in the lumbar region. Mild curvature convex to the left with the apex at L2-3. Somewhat exaggerated lumbar lordosis. 2. Mild degenerative disc disease and degenerative facet disease at L2-3, L3-4 and L4-5 with minimal disc bulges. No compressive stenosis of the canal or foramina. 3. 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     Electronically Signed   By: Nelson Chimes M.D.   On: 08/09/2020 15:28   She reports that she has quit smoking. Her smoking use included cigarettes. She has never used smokeless tobacco. No results for input(s): "HGBA1C", "LABURIC" in the last 8760 hours.  Objective:  VS:  HT:    WT:   BMI:     BP:124/75  HR:(!)  102bpm  TEMP: ( )  RESP:  Physical Exam Vitals and nursing note reviewed.  HENT:     Head: Normocephalic and atraumatic.     Right Ear: External ear normal.     Left Ear: External ear normal.     Nose: Nose normal.     Mouth/Throat:     Mouth: Mucous membranes are moist.  Eyes:     Extraocular Movements: Extraocular movements intact.  Cardiovascular:     Rate and Rhythm: Normal rate.     Pulses: Normal pulses.  Pulmonary:     Effort: Pulmonary effort is normal.  Abdominal:     General: Abdomen is flat. There is no distension.  Musculoskeletal:        General: Tenderness present.     Cervical back: Normal range of motion.     Right lower leg: 1+ Edema present.     Left lower leg: 1+ Edema present.     Comments: Pt is slow to rise from seated position to standing. Good lumbar range of motion. Strong distal strength without clonus, no pain upon palpation of greater trochanters. Multiple palpable trigger points noted to bilateral paraspinal  regions. Patient is extremely tender to touch/palpation. Sensation intact bilaterally. Ambulates with cane, gait slow and unsteady.   Skin:    General: Skin is warm and dry.     Capillary Refill: Capillary refill takes less than 2 seconds.  Neurological:     Mental Status: She is alert and oriented to person, place, and time.     Gait: Gait abnormal.  Psychiatric:        Mood and Affect: Mood normal.        Behavior: Behavior normal.     Ortho Exam  Imaging: No results found.  Past Medical/Family/Surgical/Social History: Medications & Allergies reviewed per EMR, new medications updated. Patient Active Problem List   Diagnosis Date Noted   Congestion of nasal sinus 09/02/2021   Screening for cervical cancer 06/09/2021   Perineal pain in female 06/09/2021   Right wrist pain 01/24/2021   OSA (obstructive sleep apnea)    Palpitation 09/10/2020   Chronic diastolic heart failure ([place]) 09/10/2020   History of CVA (cerebrovascular  accident) 09/10/2020   Congestive heart failure ([place]) 08/08/2020   Lower extremity edema 08/08/2020   Lower extremity pain, posterior, left 01/29/2020   Hyperlipidemia 01/09/2020   Depression 01/01/2020   Bilateral lower extremity pain 01/01/2020   Peripheral neuropathy 11/23/2019   Prediabetes 11/23/2019   Axillary lump, right 11/23/2019   Acute vestibular syndrome 08/29/2019   Ischemic vascular disease 08/29/2019   Migraine 08/29/2019   Morbid obesity ([place])    Family history of systemic lupus erythematosus 09/10/2018   Anxiety disorder 09/07/2018   Hypertension 08/01/2018   Fibromyalgia 08/01/2018   Blood-tinged sputum 08/01/2018   Chronic low back pain 08/16/2017   Gastroesophageal reflux disease 08/16/2017   Incarcerated incisional hernia 08/10/2017   S/P exploratory laparotomy 08/09/2017   Past Medical History:  Diagnosis Date   Arthritis    Depression    Fibromyalgia    Hypertension    Lupus ([place])    a. was told bloodwork was possibly positive for lupus in 08/2018 by PCP.   Morbid obesity ([place])    MS (multiple sclerosis) ([place])    a. presumptive dx in [place], [place]   OCD (obsessive compulsive disorder)    OSA (obstructive sleep apnea)    a. dx recently, pending CPAP initiation.   Palpitations    Pneumonia 07/2018   left lung   Family History  Problem Relation Age of Onset   Cancer Mother        Leukemia   Lung cancer Father    Cancer Sister        Leukemia   Lupus Sister    Lung cancer Brother    Seizures Brother    Heart attack Brother        Happened during seizure   Past Surgical History:  Procedure Laterality Date   ABDOMINAL SURGERY     BREAST EXCISIONAL BIOPSY Right    CARPAL TUNNEL RELEASE     HERNIA REPAIR     JOINT REPLACEMENT     VENTRAL HERNIA REPAIR N/A 08/09/2017   Procedure: Incarcerated ventral hernia repair with mesh ;  Surgeon: Rolm Bookbinder, MD;  Location: [facility];  Service: General;  Laterality: N/A;   Social History    Occupational History   Not on file  Tobacco Use   Smoking status: Former    Types: Cigarettes   Smokeless tobacco: Never   Tobacco comments:    Smoked for 40 years, quit in 08/2018  Vaping Use   Vaping  Use: Never used  Substance and Sexual Activity   Alcohol use: Not Currently   Drug use: Never   Sexual activity: Not Currently

## 2022-06-26 NOTE — Progress Notes (Signed)
Pt state lower back pain that travels down both legs. Pt state walking and sitting makes the pain worse. Pt state she has popping in her back. Pt state she takes pain meds and uses heat to help ease her pain.  Numeric Pain Rating Scale and Functional Assessment Average Pain 10 Pain Right Now 9 My pain is intermittent, sharp, burning, stabbing, tingling, and aching Pain is worse with: walking, sitting, and some activites Pain improves with: heat/ice and medication   In the last MONTH (on 0-10 scale) has pain interfered with the following?  1. General activity like being  able to carry out your everyday physical activities such as walking, climbing stairs, carrying groceries, or moving a chair?  Rating(6)  2. Relation with others like being able to carry out your usual social activities and roles such as  activities at home, at work and in your community. Rating(7)  3. Enjoyment of life such that you have  been bothered by emotional problems such as feeling anxious, depressed or irritable?  Rating(7)

## 2022-07-02 DIAGNOSIS — I639 Cerebral infarction, unspecified: Secondary | ICD-10-CM | POA: Diagnosis not present

## 2022-07-02 DIAGNOSIS — R7309 Other abnormal glucose: Secondary | ICD-10-CM | POA: Diagnosis not present

## 2022-07-02 DIAGNOSIS — H2513 Age-related nuclear cataract, bilateral: Secondary | ICD-10-CM | POA: Diagnosis not present

## 2022-07-02 DIAGNOSIS — H11823 Conjunctivochalasis, bilateral: Secondary | ICD-10-CM | POA: Diagnosis not present

## 2022-07-07 ENCOUNTER — Ambulatory Visit (HOSPITAL_BASED_OUTPATIENT_CLINIC_OR_DEPARTMENT_OTHER): Payer: Medicare Other | Admitting: Cardiology

## 2022-07-09 ENCOUNTER — Other Ambulatory Visit: Payer: Self-pay | Admitting: Family Medicine

## 2022-07-14 ENCOUNTER — Other Ambulatory Visit: Payer: Self-pay | Admitting: Family Medicine

## 2022-08-09 ENCOUNTER — Other Ambulatory Visit: Payer: Self-pay | Admitting: Student

## 2022-08-17 ENCOUNTER — Other Ambulatory Visit: Payer: Self-pay | Admitting: Student

## 2022-08-21 ENCOUNTER — Other Ambulatory Visit: Payer: Self-pay | Admitting: Physical Medicine and Rehabilitation

## 2022-08-24 ENCOUNTER — Other Ambulatory Visit: Payer: Self-pay | Admitting: Physical Medicine and Rehabilitation

## 2022-09-02 ENCOUNTER — Other Ambulatory Visit: Payer: Self-pay | Admitting: Student

## 2022-10-03 ENCOUNTER — Other Ambulatory Visit: Payer: Self-pay | Admitting: Family Medicine

## 2022-11-23 ENCOUNTER — Ambulatory Visit (INDEPENDENT_AMBULATORY_CARE_PROVIDER_SITE_OTHER): Payer: Medicare Other | Admitting: Student

## 2022-11-23 VITALS — BP 138/78 | HR 100 | Ht 61.0 in | Wt 279.0 lb

## 2022-11-23 DIAGNOSIS — I10 Essential (primary) hypertension: Secondary | ICD-10-CM

## 2022-11-23 DIAGNOSIS — F32A Depression, unspecified: Secondary | ICD-10-CM | POA: Diagnosis not present

## 2022-11-23 DIAGNOSIS — I509 Heart failure, unspecified: Secondary | ICD-10-CM | POA: Diagnosis not present

## 2022-11-23 DIAGNOSIS — R0602 Shortness of breath: Secondary | ICD-10-CM | POA: Diagnosis not present

## 2022-11-23 MED ORDER — LOSARTAN POTASSIUM 50 MG PO TABS: 50.0000 mg | ORAL_TABLET | Freq: Every day | ORAL | 3 refills | Status: DC

## 2022-11-23 MED ORDER — ALBUTEROL SULFATE HFA 108 (90 BASE) MCG/ACT IN AERS: 1.0000 | INHALATION_SPRAY | Freq: Four times a day (QID) | RESPIRATORY_TRACT | 2 refills | Status: DC | PRN

## 2022-11-23 MED ORDER — AZITHROMYCIN 250 MG PO TABS: ORAL_TABLET | ORAL | 0 refills | Status: DC

## 2022-11-23 MED ORDER — FLUOXETINE HCL 20 MG PO CAPS: 60.0000 mg | ORAL_CAPSULE | Freq: Every day | ORAL | 0 refills | Status: DC

## 2022-11-23 MED ORDER — CARVEDILOL 6.25 MG PO TABS: 6.2500 mg | ORAL_TABLET | Freq: Two times a day (BID) | ORAL | 1 refills | Status: DC

## 2022-11-23 MED ORDER — PREDNISONE 20 MG PO TABS: 40.0000 mg | ORAL_TABLET | Freq: Every day | ORAL | 0 refills | Status: DC

## 2022-11-23 NOTE — Patient Instructions (Signed)
Ms. Stallworth, It is lovely to meet you!   Stop by [facility] at [place], [place], [place] [address] for an Xray, they are open business hours and you can stop by. I am giving you antibiotics---take two pills today, and then one pill daily for the next four days for a total of five days of therapy. Take two tablets ('40mg'$ ) of prednisone daily for the next five days. Once feeling better, come back to see [place] and we will schedule you for pulmonary function tests to see if you do have underlying COPD.

## 2022-11-23 NOTE — Progress Notes (Signed)
    SUBJECTIVE:   CHIEF COMPLAINT / HPI:   URI Symptoms with Shortness of Breath Ongoing for about a week now.  She reports that over the Thanksgiving holiday she was caring for some family members who had upper respiratory symptoms and then developed symptoms of her own about a week later.  She has also had fevers to 101-102.  She does not have a history of COPD but notes that she is an everyday smoker.  She also has been prescribed albuterol for wheezing associated respiratory illnesses in the past.  Currently smoking about half a pack a day.  She has had shortness of breath and chest tightness without chest pain.  She has also had a cough productive of yellow/green sputum.  PERTINENT  PMH / PSH: Congestive heart failure, everyday smoker  OBJECTIVE:   BP 138/78   Pulse 100   Ht '5\' 1"'$  (1.549 m)   Wt 279 lb (126.6 kg)   SpO2 96%   BMI 52.72 kg/m   Physical Exam Vitals reviewed.  Constitutional:      General: She is not in acute distress. HENT:     Nose: No congestion or rhinorrhea.     Mouth/Throat:     Mouth: Mucous membranes are moist.  Cardiovascular:     Rate and Rhythm: Normal rate and regular rhythm.     Heart sounds: No murmur heard. Pulmonary:     Comments: Deep inspiration causing coughing fits making exam difficult.  However, I do appreciate rales throughout and diffuse expiratory wheeze.  Also believe she may be a bit diminished in the bases. Abdominal:     General: There is no distension.     Tenderness: There is no abdominal tenderness.  Lymphadenopathy:     Cervical: No cervical adenopathy.  Skin:    General: Skin is warm and dry.      ASSESSMENT/PLAN:   Shortness of breath Reassuringly in no respiratory distress at this time.  Satting well on room air.  Differential is broad and includes simple viral infection versus new COPD with exacerbation versus pneumonia versus CHF exacerbation.  Would like to obtain an x-ray to tease out if she has any  consolidation or pulmonary edema.  I do suspect that COPD with exacerbation is the most likely cause of her symptoms and will therefore treat as such.  If x-ray demonstrates a pneumonia would need to broaden antibiotic coverage.  Will need to have her back after her symptoms improve for formal PFTs. -Chest x-ray -COVID/flu/RSV swab obtained -Azithromycin and prednisone x 5 days -ED precautions reviewed -Albuterol refilled     Pearla Dubonnet, MD [place]

## 2022-11-23 NOTE — Assessment & Plan Note (Signed)
Reassuringly in no respiratory distress at this time.  Satting well on room air.  Differential is broad and includes simple viral infection versus new COPD with exacerbation versus pneumonia versus CHF exacerbation.  Would like to obtain an x-ray to tease out if she has any consolidation or pulmonary edema.  I do suspect that COPD with exacerbation is the most likely cause of her symptoms and will therefore treat as such.  If x-ray demonstrates a pneumonia would need to broaden antibiotic coverage.  Will need to have her back after her symptoms improve for formal PFTs. -Chest x-ray -COVID/flu/RSV swab obtained -Azithromycin and prednisone x 5 days -ED precautions reviewed -Albuterol refilled

## 2022-11-24 ENCOUNTER — Ambulatory Visit
Admission: RE | Admit: 2022-11-24 | Discharge: 2022-11-24 | Disposition: A | Discharge status: Home / Self Care | Payer: Medicare Other | Source: Ambulatory Visit | Attending: Family Medicine | Admitting: Family Medicine

## 2022-11-24 DIAGNOSIS — R0602 Shortness of breath: Secondary | ICD-10-CM | POA: Diagnosis not present

## 2022-11-26 LAB — COVID-19, FLU A+B AND RSV
Influenza A, NAA: NOT DETECTED
Influenza B, NAA: NOT DETECTED
RSV, NAA: NOT DETECTED
SARS-CoV-2, NAA: NOT DETECTED

## 2022-12-12 ENCOUNTER — Encounter (HOSPITAL_COMMUNITY): Payer: Self-pay | Admitting: *Deleted

## 2022-12-12 ENCOUNTER — Emergency Department (HOSPITAL_COMMUNITY): Payer: Medicare Other

## 2022-12-12 ENCOUNTER — Emergency Department (HOSPITAL_COMMUNITY)
Admission: EM | Admit: 2022-12-12 | Discharge: 2022-12-12 | Disposition: A | Discharge status: Home / Self Care | Payer: Medicare Other | Attending: Emergency Medicine | Admitting: Emergency Medicine

## 2022-12-12 ENCOUNTER — Other Ambulatory Visit: Payer: Self-pay

## 2022-12-12 DIAGNOSIS — N200 Calculus of kidney: Secondary | ICD-10-CM

## 2022-12-12 DIAGNOSIS — Z91010 Allergy to peanuts: Secondary | ICD-10-CM | POA: Insufficient documentation

## 2022-12-12 DIAGNOSIS — N132 Hydronephrosis with renal and ureteral calculous obstruction: Secondary | ICD-10-CM | POA: Diagnosis not present

## 2022-12-12 DIAGNOSIS — I10 Essential (primary) hypertension: Secondary | ICD-10-CM | POA: Diagnosis not present

## 2022-12-12 DIAGNOSIS — Z7902 Long term (current) use of antithrombotics/antiplatelets: Secondary | ICD-10-CM | POA: Diagnosis not present

## 2022-12-12 DIAGNOSIS — D72829 Elevated white blood cell count, unspecified: Secondary | ICD-10-CM | POA: Diagnosis not present

## 2022-12-12 DIAGNOSIS — Z743 Need for continuous supervision: Secondary | ICD-10-CM | POA: Diagnosis not present

## 2022-12-12 DIAGNOSIS — R109 Unspecified abdominal pain: Secondary | ICD-10-CM | POA: Diagnosis not present

## 2022-12-12 DIAGNOSIS — K439 Ventral hernia without obstruction or gangrene: Secondary | ICD-10-CM | POA: Diagnosis not present

## 2022-12-12 DIAGNOSIS — K5730 Diverticulosis of large intestine without perforation or abscess without bleeding: Secondary | ICD-10-CM | POA: Diagnosis not present

## 2022-12-12 DIAGNOSIS — Z79899 Other long term (current) drug therapy: Secondary | ICD-10-CM | POA: Insufficient documentation

## 2022-12-12 LAB — CBC WITH DIFFERENTIAL/PLATELET
Abs Immature Granulocytes: 0.06 10*3/uL (ref 0.00–0.07)
Basophils Absolute: 0 10*3/uL (ref 0.0–0.1)
Basophils Relative: 0 %
Eosinophils Absolute: 0.1 10*3/uL (ref 0.0–0.5)
Eosinophils Relative: 1 %
HCT: 46.4 % — ABNORMAL HIGH (ref 36.0–46.0)
Hemoglobin: 15 g/dL (ref 12.0–15.0)
Immature Granulocytes: 1 %
Lymphocytes Relative: 22 %
Lymphs Abs: 2.7 10*3/uL (ref 0.7–4.0)
MCH: 29.2 pg (ref 26.0–34.0)
MCHC: 32.3 g/dL (ref 30.0–36.0)
MCV: 90.4 fL (ref 80.0–100.0)
Monocytes Absolute: 0.8 10*3/uL (ref 0.1–1.0)
Monocytes Relative: 7 %
Neutro Abs: 8.4 10*3/uL — ABNORMAL HIGH (ref 1.7–7.7)
Neutrophils Relative %: 69 %
Platelets: 282 10*3/uL (ref 150–400)
RBC: 5.13 MIL/uL — ABNORMAL HIGH (ref 3.87–5.11)
RDW: 15.1 % (ref 11.5–15.5)
WBC: 12.1 10*3/uL — ABNORMAL HIGH (ref 4.0–10.5)
nRBC: 0 % (ref 0.0–0.2)

## 2022-12-12 LAB — URINALYSIS, ROUTINE W REFLEX MICROSCOPIC
Bilirubin Urine: NEGATIVE
Glucose, UA: NEGATIVE mg/dL
Ketones, ur: NEGATIVE mg/dL
Leukocytes,Ua: NEGATIVE
Nitrite: NEGATIVE
Protein, ur: NEGATIVE mg/dL
RBC / HPF: >50 RBC/hpf — ABNORMAL HIGH (ref 0–5)
Specific Gravity, Urine: 1.016 (ref 1.005–1.030)
pH: 5 (ref 5.0–8.0)

## 2022-12-12 LAB — COMPREHENSIVE METABOLIC PANEL
ALT: 12 U/L (ref 0–44)
AST: 17 U/L (ref 15–41)
Albumin: 3.3 g/dL — ABNORMAL LOW (ref 3.5–5.0)
Alkaline Phosphatase: 102 U/L (ref 38–126)
Anion gap: 11 (ref 5–15)
BUN: 10 mg/dL (ref 6–20)
CO2: 27 mmol/L (ref 22–32)
Calcium: 8.9 mg/dL (ref 8.9–10.3)
Chloride: 103 mmol/L (ref 98–111)
Creatinine, Ser: 0.87 mg/dL (ref 0.44–1.00)
GFR, Estimated: >60 mL/min (ref >60)
Glucose, Bld: 116 mg/dL — ABNORMAL HIGH (ref 70–99)
Potassium: 4 mmol/L (ref 3.5–5.1)
Sodium: 141 mmol/L (ref 135–145)
Total Bilirubin: 0.4 mg/dL (ref 0.3–1.2)
Total Protein: 7 g/dL (ref 6.5–8.1)

## 2022-12-12 LAB — LIPASE, BLOOD: Lipase: 26 U/L (ref 11–51)

## 2022-12-12 MED ORDER — LACTATED RINGERS IV BOLUS
1000.0000 mL | Freq: Once | INTRAVENOUS | Status: AC
  Administered 2022-12-12: 1000 mL via INTRAVENOUS

## 2022-12-12 MED ORDER — HYDROMORPHONE HCL 1 MG/ML IJ SOLN
1.0000 mg | Freq: Once | INTRAMUSCULAR | Status: AC
  Administered 2022-12-12: 1 mg via INTRAVENOUS
  Filled 2022-12-12: qty 1

## 2022-12-12 MED ORDER — ONDANSETRON HCL 4 MG/2ML IJ SOLN
4.0000 mg | Freq: Once | INTRAMUSCULAR | Status: AC
  Administered 2022-12-12: 4 mg via INTRAVENOUS
  Filled 2022-12-12: qty 2

## 2022-12-12 MED ORDER — OXYCODONE-ACETAMINOPHEN 5-325 MG PO TABS
1.0000 | ORAL_TABLET | Freq: Once | ORAL | Status: AC
  Administered 2022-12-12: 1 via ORAL
  Filled 2022-12-12: qty 1

## 2022-12-12 MED ORDER — TAMSULOSIN HCL 0.4 MG PO CAPS: 0.4000 mg | ORAL_CAPSULE | Freq: Every day | ORAL | 0 refills | Status: AC

## 2022-12-12 MED ORDER — OXYCODONE HCL 5 MG PO TABS: 5.0000 mg | ORAL_TABLET | ORAL | 0 refills | Status: DC | PRN

## 2022-12-12 MED ORDER — ONDANSETRON 4 MG PO TBDP: 4.0000 mg | ORAL_TABLET | Freq: Three times a day (TID) | ORAL | 0 refills | Status: DC | PRN

## 2022-12-12 MED ORDER — ONDANSETRON 4 MG PO TBDP
4.0000 mg | ORAL_TABLET | Freq: Once | ORAL | Status: AC
  Administered 2022-12-12: 4 mg via ORAL
  Filled 2022-12-12: qty 1

## 2022-12-12 NOTE — ED Provider Triage Note (Signed)
Emergency Medicine Provider Triage Evaluation Note  Megan Dixon , a 56 y.o. female  was evaluated in triage.  She has a history of C-section, fibromyalgia, lupus.  Pt complains of acute onset of right flank pain and right sided abdominal pain.  She was awakened from sleep.  She tried taking a Flexeril without improvement.  She has had nausea but no vomiting.  No urinary symptoms including dysuria or hematuria.  She was transported by EMS.  Review of Systems  Positive: Flank pain, nausea Negative: Fever, vomiting  Physical Exam  BP (!) 147/85   Pulse 82   Temp 97.7 F (36.5 C)   Resp 18   SpO2 95%  Gen:   Awake, uncomfortable appearing crying in pain Resp:  Normal effort  MSK:   Moves extremities without difficulty  Other:  Tenderness to palpation right lateral and right upper abdomen, right flank.  Medical Decision Making  Medically screening exam initiated at 5:02 AM.  Appropriate orders placed.  Shelena Castelluccio was informed that the remainder of the evaluation will be completed by another provider, this initial triage assessment does not replace that evaluation, and the importance of remaining in the ED until their evaluation is complete.     Carlisle Cater, PA-C 12/12/22 [phone]

## 2022-12-12 NOTE — ED Notes (Signed)
Ambulated in the hallway on a steady gait independently. Pt O2 maintained at 91% the lowest. Denies shortness of breath. Pt is now awake and eating. Alert and oriented x 4. Will notify MD.

## 2022-12-12 NOTE — ED Notes (Signed)
O2 at 2LPM initiated as pt O2 dropped to low 70s even when RN awakened pt. Pt reports mild SOB. Pt O2 maintains at 94% on 2LPM [place]. Remains alert and oriented x 4. Will notify MD.

## 2022-12-12 NOTE — ED Triage Notes (Signed)
The pt woke up with acute rt flank pain   nausea

## 2022-12-12 NOTE — ED Notes (Signed)
Patient is taking off oxygen to monitor saturation on room air

## 2022-12-12 NOTE — Discharge Instructions (Addendum)
For your pain, you may take up to 1000mg of acetaminophen (tylenol) 4 times daily for up to a week. This is the maximum dose of acetminophen (tylenol) you can take from all sources. Please check other over-the-counter medications and prescriptions to ensure you are not taking other medications that contain acetaminophen.  You may also take ibuprofen 400 mg 6 times a day OR 600mg 4 times a day alternating with or at the same time as tylenol.  Take oxycodone as needed for breakthrough pain.  This medication can be addicting, sedating and cause constipation.    

## 2022-12-12 NOTE — ED Notes (Signed)
The pt is number 4 on the list to be scanned

## 2022-12-12 NOTE — ED Provider Notes (Signed)
[facility] EMERGENCY DEPARTMENT Provider Note   CSN: [id number] Arrival date & time: 12/12/22  0453     History  Chief Complaint  Patient presents with   Flank Pain    Megan Dixon is a 56 y.o. female.  HPI      56 year old female with a history of hypertension, lupus, MS, OCD, OSA, fibromyalgia, who presents with concern for right flank pain.  Woke up from sleepin with acute onset right flank pain. Tried taking flexeril without improvement.  Has nausea, no vomiting. No dysuria, hematuria, fever ,no constipation or diarrhea.  No hx of nephrolithiasis.  Severe pain 10/10, right flank with radiation around to right abdomen.   Past Medical History:  Diagnosis Date   Arthritis    Depression    Fibromyalgia    Hypertension    Lupus ([place])    a. was told bloodwork was possibly positive for lupus in 08/2018 by PCP.   Morbid obesity ([place])    MS (multiple sclerosis) ([place])    a. presumptive dx in [place], [place]   OCD (obsessive compulsive disorder)    OSA (obstructive sleep apnea)    a. dx recently, pending CPAP initiation.   Palpitations    Pneumonia 07/2018   left lung     Home Medications Prior to Admission medications   Medication Sig Start Date End Date Taking? Authorizing Provider  albuterol (VENTOLIN HFA) 108 (90 Base) MCG/ACT inhaler Inhale 1-2 puffs into the lungs every 6 (six) hours as needed for wheezing or shortness of breath. 11/23/22   Eppie Gibson, MD  amLODipine (NORVASC) 10 MG tablet Take 1 tablet (10 mg total) by mouth at bedtime. 05/01/22   Precious Gilding, DO  atorvastatin (LIPITOR) 40 MG tablet TAKE 1 TABLET BY MOUTH EVERY DAY 10/06/21   Concepcion Living, MD  azithromycin (ZITHROMAX) 250 MG tablet Take two tablets today followed by one tablet daily for the next four days for a total of five days of therapy 11/23/22   Eppie Gibson, MD  benzonatate (TESSALON PERLES) 100 MG capsule Take 1 capsule (100 mg total) by mouth 3 (three)  times daily as needed for cough. 09/01/21   Concepcion Living, MD  carvedilol (COREG) 6.25 MG tablet Take 1 tablet (6.25 mg total) by mouth 2 (two) times daily with a meal. 11/23/22   Eppie Gibson, MD  clopidogrel (PLAVIX) 75 MG tablet TAKE 1 TABLET BY MOUTH EVERY DAY 11/24/21   Concepcion Living, MD  cyclobenzaprine (FLEXERIL) 10 MG tablet TAKE 1 TABLET BY MOUTH THREE TIMES A DAY AS NEEDED FOR MUSCLE SPASMS 07/14/22   Wells Guiles, DO  Elastic Bandages & Supports (MEDICAL COMPRESSION STOCKINGS) MISC 1 Container by Does not apply route daily. Apply to both legs daily 01/01/20   Concepcion Living, MD  FLUoxetine (PROZAC) 20 MG capsule Take 3 capsules (60 mg total) by mouth daily. 11/23/22 02/21/23  Eppie Gibson, MD  furosemide (LASIX) 40 MG tablet TAKE 1 TABLET BY MOUTH EVERY DAY 01/12/22   Cresenzo, Angelyn Punt, MD  gabapentin (NEURONTIN) 300 MG capsule Take 3 capsules (900 mg total) by mouth 3 (three) times daily as needed. 05/01/22   Precious Gilding, DO  lidocaine (XYLOCAINE) 5 % ointment Apply 1 application topically 4 (four) times daily as needed. 08/01/21   Salvadore Dom, MD  losartan (COZAAR) 50 MG tablet Take 1 tablet (50 mg total) by mouth daily. 11/23/22   Eppie Gibson, MD  meclizine Johnathan Hausen)  25 MG tablet Take 1 tablet (25 mg total) by mouth 3 (three) times daily as needed for dizziness. 04/11/21   Concepcion Living, MD  nortriptyline (PAMELOR) 25 MG capsule TAKE 1 CAPSULE BY MOUTH AT BEDTIME. 08/24/22   Lorine Bears, NP  ondansetron (ZOFRAN ODT) 4 MG disintegrating tablet Take 1 tablet (4 mg total) by mouth every 8 (eight) hours as needed for nausea or vomiting. 09/02/19   Kathi Ludwig, MD  pantoprazole (PROTONIX) 40 MG tablet TAKE 1 TABLET BY MOUTH EVERY DAY 10/05/22   Wells Guiles, DO  potassium chloride SA (KLOR-CON M) 20 MEQ tablet Take 1 tablet (20 mEq total) by mouth 3 (three) times daily for 1 day. 05/04/22 05/05/22  Precious Gilding, DO  predniSONE (DELTASONE) 20 MG tablet  Take 2 tablets (40 mg total) by mouth daily with breakfast. 11/23/22   Eppie Gibson, MD      Allergies    Morphine and related, Other, Peanut-containing drug products, and Toradol [ketorolac tromethamine]    Review of Systems   Review of Systems  Physical Exam Updated Vital Signs BP (!) 143/76   Pulse 84   Temp 97.7 F (36.5 C)   Resp 18   Ht '5\' 1"'$  (1.549 m)   Wt 126.6 kg   SpO2 95%   BMI 52.74 kg/m  Physical Exam Vitals and nursing note reviewed.  Constitutional:      General: She is not in acute distress.    Appearance: She is well-developed. She is not diaphoretic.  HENT:     Head: Normocephalic and atraumatic.  Eyes:     Conjunctiva/sclera: Conjunctivae normal.  Cardiovascular:     Rate and Rhythm: Normal rate and regular rhythm.     Heart sounds: Normal heart sounds. No murmur heard.    No friction rub. No gallop.  Pulmonary:     Effort: Pulmonary effort is normal. No respiratory distress.     Breath sounds: Normal breath sounds. No wheezing or rales.  Abdominal:     General: There is no distension.     Palpations: Abdomen is soft.     Tenderness: There is abdominal tenderness (right sided). There is right CVA tenderness. There is no guarding.  Musculoskeletal:        General: No tenderness.     Cervical back: Normal range of motion.  Skin:    General: Skin is warm and dry.     Findings: No erythema or rash.  Neurological:     Mental Status: She is alert and oriented to person, place, and time.     ED Results / Procedures / Treatments   Labs (all labs ordered are listed, but only abnormal results are displayed) Labs Reviewed  CBC WITH DIFFERENTIAL/PLATELET - Abnormal; Notable for the following components:      Result Value   WBC 12.1 (*)    RBC 5.13 (*)    HCT 46.4 (*)    Neutro Abs 8.4 (*)    All other components within normal limits  COMPREHENSIVE METABOLIC PANEL - Abnormal; Notable for the following components:   Glucose, Bld 116 (*)     Albumin 3.3 (*)    All other components within normal limits  URINALYSIS, ROUTINE W REFLEX MICROSCOPIC - Abnormal; Notable for the following components:   APPearance HAZY (*)    Hgb urine dipstick MODERATE (*)    RBC / HPF >50 (*)    Bacteria, UA RARE (*)    All other components within normal limits  LIPASE, BLOOD    EKG None  Radiology CT Renal Stone Study  Result Date: 12/12/2022 CLINICAL DATA:  56 year old female with history of abdominal and flank pain. EXAM: CT ABDOMEN AND PELVIS WITHOUT CONTRAST TECHNIQUE: Multidetector CT imaging of the abdomen and pelvis was performed following the standard protocol without IV contrast. RADIATION DOSE REDUCTION: This exam was performed according to the departmental dose-optimization program which includes automated exposure control, adjustment of the mA and/or kV according to patient size and/or use of iterative reconstruction technique. COMPARISON:  CT of the abdomen and pelvis 08/23/2018. FINDINGS: Lower chest: Unremarkable. Hepatobiliary: No definite suspicious cystic or solid hepatic lesions are confidently identified on today's noncontrast CT examination. Unenhanced appearance of the gallbladder is unremarkable. Pancreas: No definite pancreatic mass or peripancreatic fluid collections or inflammatory changes are noted on today's noncontrast CT examination. Spleen: Unremarkable. Adrenals/Urinary Tract: At the level of the right ureterovesicular junction (axial image 73 of series 3) there is a 3 mm calculus. This is associated with mild proximal right hydroureteronephrosis. Unenhanced appearance of the right kidney is otherwise unremarkable. 1.7 cm exophytic low-attenuation lesion in the posterior aspect of the interpolar region of the left kidney, incompletely characterized on today's noncontrast CT examination, but statistically likely to represent a cyst (no imaging follow-up recommended). Bilateral adrenal glands are unremarkable in appearance.  Urinary bladder is otherwise unremarkable in appearance. Stomach/Bowel: Unenhanced appearance of the stomach is normal. There is no pathologic dilatation of small bowel or colon. Numerous colonic diverticuli are noted, without surrounding inflammatory changes to suggest an acute diverticulitis at this time. The appendix is not confidently identified and may be surgically absent. Regardless, there are no inflammatory changes noted adjacent to the cecum to suggest the presence of an acute appendicitis at this time. Vascular/Lymphatic: Aortic atherosclerosis. No lymphadenopathy noted in the abdomen or pelvis. Reproductive: Uterus and ovaries are atrophic. Other: No significant volume of ascites. No pneumoperitoneum. Several small supraumbilical ventral hernias are noted containing only omental fat. Musculoskeletal: There are no aggressive appearing lytic or blastic lesions noted in the visualized portions of the skeleton. IMPRESSION: 1. 3 mm calculus at the right ureterovesicular junction with mild proximal right hydroureteronephrosis indicating mild right-sided obstruction. 2. Colonic diverticulosis without evidence of acute diverticulitis at this time. 3. Multiple small ventral hernias noted in the supraumbilical region. These containing only omental fat. No associated bowel incarceration or obstruction at this time. 4. Aortic atherosclerosis. Electronically Signed   By: Vinnie Langton M.D.   On: 12/12/2022 07:11    Procedures Procedures    Medications Ordered in ED Medications  oxyCODONE-acetaminophen (PERCOCET/ROXICET) 5-325 MG per tablet 1 tablet (1 tablet Oral Given 12/12/22 0516)  ondansetron (ZOFRAN-ODT) disintegrating tablet 4 mg (4 mg Oral Given 12/12/22 0515)    ED Course/ Medical Decision Making/ A&P                           Medical Decision Making Risk Prescription drug management.    56 year old female with a history of hypertension, lupus, MS, OCD, OSA, fibromyalgia, who presents  with concern for right flank pain.  DDx includes appendicitis, pancreatitis, cholecystitis, pyelonephritis, nephrolithiasis, diverticulitis, SBO, dissection.  Labs completed and personally evaluated by me show no anemia, mild leukocytosis, normal renal function, no pancreatitis, no electrolyte abnormality. UA with blood but not consistent with infection.  CT stone study completed and shows 69m right UVJ with mild right hydrouteronephrosis, multiple small ventral hernias containing only omental fat.   Given  IV fluids, pain, nausea control.  Some continuing pain, given additional dilaudid.  Desaturation following pain medications, continued to monitor until improved.  Pain significantly improved.   Reviewed in [place] drug database and discussed risks of narcotic medications. Given rx for oxycodone, recommend tylenol and ibuprofen as well for pain> Given flomax, zofran.  Patient discharged in stable condition with understanding of reasons to return.         Final Clinical Impression(s) / ED Diagnoses Final diagnoses:  Nephrolithiasis    Rx / DC Orders ED Discharge Orders     None         Gareth Morgan, MD 12/12/22 2202

## 2023-01-20 ENCOUNTER — Other Ambulatory Visit: Payer: Self-pay | Admitting: Student

## 2023-01-29 ENCOUNTER — Other Ambulatory Visit: Payer: Self-pay

## 2023-01-30 MED ORDER — CLOPIDOGREL BISULFATE 75 MG PO TABS: 75.0000 mg | ORAL_TABLET | Freq: Every day | ORAL | 3 refills | Status: DC

## 2023-04-27 ENCOUNTER — Other Ambulatory Visit: Payer: Self-pay

## 2023-04-27 DIAGNOSIS — M797 Fibromyalgia: Secondary | ICD-10-CM

## 2023-04-28 MED ORDER — GABAPENTIN 300 MG PO CAPS: 900.0000 mg | ORAL_CAPSULE | Freq: Three times a day (TID) | ORAL | 0 refills | Status: DC | PRN

## 2023-05-04 ENCOUNTER — Ambulatory Visit: Payer: Medicare Other | Admitting: Student

## 2023-05-08 NOTE — Progress Notes (Unsigned)
  SUBJECTIVE:   CHIEF COMPLAINT / HPI:   Bilateral hip and leg pain: notes this is worsening, feels like her legs are on fire, swelling, x 6 months on/off. Feels like she's walking barefoot on rocks. She is taking her gabapentin 900mg  TID, extra strength tylenol, flexeril. Did have a fall last month where her leg gave out on her.  Denies loss of bowel or bladder.  PERTINENT  PMH / PSH: Fibromyalgia, HTN, HLD, chronic diastolic heart failure, anxiety, CVA, OSA  Patient Care Team: [name], DO as PCP - General (Family Medicine) Jodelle Red, MD as Consulting Physician (Cardiology) OBJECTIVE:  BP 124/76   Pulse 90   Ht 5\' 1"  (1.549 m)   Wt 276 lb 9.6 oz (125.5 kg)   SpO2 96%   BMI 52.26 kg/m  Physical Exam   ASSESSMENT/PLAN:  There are no diagnoses linked to this encounter. No follow-ups on file. [name], DO 05/11/2023, 3:22 PM PGY-***, [facility] {    This will disappear when note is signed, click to select method of visit    :1}

## 2023-05-11 ENCOUNTER — Encounter: Payer: Self-pay | Admitting: Student

## 2023-05-11 ENCOUNTER — Ambulatory Visit (INDEPENDENT_AMBULATORY_CARE_PROVIDER_SITE_OTHER): Payer: 59 | Admitting: Student

## 2023-05-11 VITALS — BP 124/76 | HR 90 | Ht 61.0 in | Wt 276.6 lb

## 2023-05-11 DIAGNOSIS — M79604 Pain in right leg: Secondary | ICD-10-CM

## 2023-05-11 DIAGNOSIS — M797 Fibromyalgia: Secondary | ICD-10-CM | POA: Diagnosis not present

## 2023-05-11 DIAGNOSIS — M79605 Pain in left leg: Secondary | ICD-10-CM

## 2023-05-11 MED ORDER — GABAPENTIN 300 MG PO CAPS: 300.0000 mg | ORAL_CAPSULE | Freq: Three times a day (TID) | ORAL | 0 refills | Status: DC | PRN

## 2023-05-11 NOTE — Patient Instructions (Addendum)
It was great to see you today! Thank you for choosing [facility] for your primary care. Megan Dixon was seen for bilateral lower extremity pain.  Today we addressed: Let's have a physical therapist evaluate her gait and see if there are any changes we can make.  I am concerned that your medication use is contributing to the falls and recommend discontinuing Flexeril or backing off gabapentin to a different dosage.  If you haven't already, sign up for My Chart to have easy access to your labs results, and communication with your primary care physician.  You should return to our clinic Return in about 4 weeks (around 06/08/2023). Please arrive 15 minutes before your appointment to ensure smooth check in process.  We appreciate your efforts in making this happen.  Thank you for allowing me to participate in your care, [name], DO 05/11/2023, 3:31 PM PGY-2, [facility]

## 2023-05-12 NOTE — Assessment & Plan Note (Signed)
>>  ASSESSMENT AND PLAN FOR BILATERAL LOWER EXTREMITY PAIN WRITTEN ON 05/13/2023  8:51 AM BY Yuliana Vandrunen, DO  Physical exam and history do not line up with specific diagnosis. I do not feel that imaging would be beneficial at this time. PT eval for gait may prove useful. Her frequency of falls is also concerning and given her many medications have not been helpful, we've agreed to decrease her gabapentin to 300mg  TID. Recommend discontinuing flexeril as well.

## 2023-05-12 NOTE — Assessment & Plan Note (Signed)
Physical exam and history do not line up with specific diagnosis. I do not feel that imaging would be beneficial at this time. PT eval for gait may prove useful. Her frequency of falls is also concerning and given her many medications have not been helpful, we've agreed to decrease her gabapentin to 300mg  TID. Recommend discontinuing flexeril as well.

## 2023-05-25 ENCOUNTER — Ambulatory Visit: Payer: Medicaid Other

## 2023-06-06 ENCOUNTER — Other Ambulatory Visit: Payer: Self-pay | Admitting: Student

## 2023-06-06 DIAGNOSIS — F32A Depression, unspecified: Secondary | ICD-10-CM

## 2023-06-14 ENCOUNTER — Encounter (HOSPITAL_BASED_OUTPATIENT_CLINIC_OR_DEPARTMENT_OTHER): Payer: Self-pay | Admitting: Cardiology

## 2023-06-14 ENCOUNTER — Other Ambulatory Visit (INDEPENDENT_AMBULATORY_CARE_PROVIDER_SITE_OTHER): Payer: Medicare Other

## 2023-06-14 ENCOUNTER — Ambulatory Visit (INDEPENDENT_AMBULATORY_CARE_PROVIDER_SITE_OTHER): Payer: 59 | Admitting: Cardiology

## 2023-06-14 VITALS — BP 123/83 | HR 80 | Ht 61.0 in | Wt 270.0 lb

## 2023-06-14 DIAGNOSIS — R002 Palpitations: Secondary | ICD-10-CM

## 2023-06-14 DIAGNOSIS — I5032 Chronic diastolic (congestive) heart failure: Secondary | ICD-10-CM | POA: Diagnosis not present

## 2023-06-14 DIAGNOSIS — Z7189 Other specified counseling: Secondary | ICD-10-CM

## 2023-06-14 DIAGNOSIS — I10 Essential (primary) hypertension: Secondary | ICD-10-CM

## 2023-06-14 DIAGNOSIS — I872 Venous insufficiency (chronic) (peripheral): Secondary | ICD-10-CM

## 2023-06-14 DIAGNOSIS — Z8673 Personal history of transient ischemic attack (TIA), and cerebral infarction without residual deficits: Secondary | ICD-10-CM

## 2023-06-14 NOTE — Progress Notes (Signed)
Cardiology Office Note:  .    Date:  06/14/2023  ID:  Megan Dixon, DOB [birth year], MRN [id number] PCP: Program, [facility]  [facility] Providers Cardiologist:  Megan Red, MD     History of Present Illness: .    Megan Dixon is a 57 y.o. female with a hx of hypertension, obesity, vertigo, prior cerebellar infarction, who is seen for follow up today. I initially met her 09/22/2018 in the hospital when she was admitted for dizziness and intermittent chest pain.   CV history: chronic intermittent palpitations and atypical chest pain. Prior monitors in 2019, 2021, 2023; echoes in 2019, 2021.    Today, she complains of episodes of palpitations that occur at random times throughout the day whether she is sitting, standing, asleep, or awake. Her episodes may be associated with feeling like she is going to pass out; she is forced to pause and catch herself. She has also experienced sensations of "everything rushes to her head, gets real tight and dizzy" and feeling faint. When her palpitations wake her up at night, she is forced to sit up as she can't breathe. She is able to hear her heart beating. She has tried sleeping on top of extra pillows and lying more flat, neither have helped. At other times, she may experience the palpitations while standing up cooking, and suddenly feel very hot/diaphoretic.   Additionally she has been aware of new pain that radiates across her bilateral shoulders. The shoulder pain may or may not correlate with the palpitations.  She complains of occasional tingling sensations in her left arm, and some neck stiffness. She also struggles with restless legs/neuropathy. As she is sitting on the exam table she notes that her legs have felt more tight. She endorses swelling in her legs usually worse with being up and active, such as walking a couple blocks for exercise or completing housework. Initially she only had swelling in  her right leg. Lately she has noticed that her swelling has progressed to her left leg as well.  She denies any chest pain, headaches, or PND.  ROS:  Please see the history of present illness. ROS otherwise negative except as noted.  (+) Palpitations (+) Near-syncope (+) Dizziness (+) Shortness of breath (+) Diaphoresis (+) Bilateral shoulder pain (+) Tingling sensations of LUE (+) Neck stiffness (+) BLE swelling and neuropathy  Studies Reviewed: Marland Kitchen    EKG Interpretation Date/Time:  Monday June 14 2023 13:52:14 EDT Ventricular Rate:  80 PR Interval:  166 QRS Duration:  88 QT Interval:  414 QTC Calculation: 477 R Axis:   15  Text Interpretation: Sinus rhythm with occasional Premature ventricular complexes Possible Left atrial enlargement When compared with ECG of 31-Dec-2020 07:15, Vent. rate has decreased BY  42 BPM Confirmed by Megan Dixon [phone]) on 06/14/2023 1:59:27 PM    Physical Exam:    VS:  BP 123/83   Pulse 80   Ht 5\' 1"  (1.549 m)   Wt 270 lb (122.5 kg)   SpO2 96%   BMI 51.02 kg/m    Wt Readings from Last 3 Encounters:  06/14/23 270 lb (122.5 kg)  05/11/23 276 lb 9.6 oz (125.5 kg)  12/12/22 279 lb 1.6 oz (126.6 kg)    GEN: Well nourished, well developed in no acute distress HEENT: Normal, moist mucous membranes NECK: No JVD CARDIAC: regular rhythm, normal S1 and S2, no rubs or gallops. No murmur. VASCULAR: Radial and DP pulses 2+ bilaterally. No carotid bruits RESPIRATORY:  Clear to auscultation without rales, wheezing or rhonchi  ABDOMEN: Soft, non-tender, non-distended MUSCULOSKELETAL:  Ambulates independently SKIN: Warm and dry, nonpitting LE edema bilaterally with mild skin darkening NEUROLOGIC:  Alert and oriented x 3. No focal neuro deficits noted. PSYCHIATRIC:  Normal affect   ASSESSMENT AND PLAN: .    Palpitations -reviewed other possible etiologies of palpitations beyond arrhythmia -reviewed prior monitor results, which have not  shown arrhythmia as etiology of her symptoms -she is feeling worse and would like to repeat monitor today. 2 week Zio placed -counseled on Dixon flag warning signs that need immediate medical attention   Hypertension:  -continue amlodipine 10 mg, carvedilol 6.25 mg BID, losartan 50 mg daily -discussed home BP monitoring -discussed lifestyle recommendations for hypertension   Chronic diastolic heart failure, Chronic LE edema, most consistent with venous insufficiency -grade 1 diastolic dysfunction on echo -reviewed compression, elevation, salt avoidance   Morbid obesity: BMI 58-->51 -she would be a good candidate for GLP1RA with her BMI and comorbidities. Unfortunately, these medications are currently not well covered by Medicare, and she does not have a diabetes diagnosis. When coverage improves, would re-evaluate -she is working on lifestyle changes   OSA: continue CPAP use   History of CVA: -on atorvastatin 40 mg daily -on clopidogrel daily   CV risk counseling and prevention: -recommend heart healthy/Mediterranean diet, with whole grains, fruits, vegetable, fish, lean meats, nuts, and olive oil. Limit salt. -recommend moderate walking, 3-5 times/week for 30-50 minutes each session. Aim for at least 150 minutes.week. Goal should be pace of 3 miles/hours, or walking 1.5 miles in 30 minutes -recommend avoidance of tobacco products. Avoid excess alcohol.  Dispo: Follow-up in 1 year, or sooner as needed.  I,Mathew Stumpf,acting as a Neurosurgeon for [employer], MD.,have documented all relevant documentation on the behalf of Megan Red, MD,as directed by  Megan Red, MD while in the presence of Megan Red, MD.  I, Megan Red, MD, have reviewed all documentation for this visit. The documentation on 06/14/23 for the exam, diagnosis, procedures, and orders are all accurate and complete.   Signed, Megan Red, MD

## 2023-06-14 NOTE — Patient Instructions (Signed)
Medication Instructions:  Continue current medications  *If you need a refill on your cardiac medications before your next appointment, please call your pharmacy*   Lab Work: None Ordered  Testing/Procedures: Your physician has recommended that you wear a 2 weeks Zio monitor. Holter monitors are medical devices that record the heart's electrical activity. Doctors most often use these monitors to diagnose arrhythmias. Arrhythmias are problems with the speed or rhythm of the heartbeat. The monitor is a small, portable device. You can wear one while you do your normal daily activities. This is usually used to diagnose what is causing palpitations/syncope (passing out).   Follow-Up: At [facility], you and your health needs are our priority.  As part of our continuing mission to provide you with exceptional heart care, we have created designated Provider Care Teams.  These Care Teams include your primary Cardiologist (physician) and Advanced Practice Providers (APPs -  Physician Assistants and Nurse Practitioners) who all work together to provide you with the care you need, when you need it.  We recommend signing up for the patient portal called "MyChart".  Sign up information is provided on this After Visit Summary.  MyChart is used to connect with patients for Virtual Visits (Telemedicine).  Patients are able to view lab/test results, encounter notes, upcoming appointments, etc.  Non-urgent messages can be sent to your provider as well.   To learn more about what you can do with MyChart, go to [email or web address].    Your next appointment:   1 year(s)  Provider:   Jodelle Red, MD    Other Instructions  Christena Deem- Long Term Monitor Instructions  Your physician has requested you wear a ZIO patch monitor for 14 days.  This is a single patch monitor. [employer] supplies one patch monitor per enrollment. Additional stickers are not available. Please do not apply patch if you  will be having a Nuclear Stress Test,  Echocardiogram, Cardiac CT, MRI, or Chest Xray during the period you would be wearing the  monitor. The patch cannot be worn during these tests. You cannot remove and re-apply the  ZIO XT patch monitor.  Your ZIO patch monitor will be mailed 3 day USPS to your address on file. It may take 3-5 days  to receive your monitor after you have been enrolled.  Once you have received your monitor, please review the enclosed instructions. Your monitor  has already been registered assigning a specific monitor serial # to you.  Billing and Patient Assistance Program Information  We have supplied [employer] with any of your insurance information on file for billing purposes. [employer] offers a sliding scale Patient Assistance Program for patients that do not have  insurance, or whose insurance does not completely cover the cost of the ZIO monitor.  You must apply for the Patient Assistance Program to qualify for this discounted rate.  To apply, please call [employer] at [phone], select option 4, select option 2, ask to apply for  Patient Assistance Program. Meredeth Ide will ask your household income, and how many people  are in your household. They will quote your out-of-pocket cost based on that information.  [employer] will also be able to set up a 2-month, interest-free payment plan if needed.  Applying the monitor   Shave hair from upper left chest.  Hold abrader disc by orange tab. Rub abrader in 40 strokes over the upper left chest as  indicated in your monitor instructions.  Clean area with 4 enclosed alcohol pads. Let dry.  Apply patch as indicated in monitor instructions. Patch will be placed under collarbone on left  side of chest with arrow pointing upward.  Rub patch adhesive wings for 2 minutes. Remove white label marked "1". Remove the white  label marked "2". Rub patch adhesive wings for 2 additional minutes.  While looking in a mirror, press and release  button in center of patch. A small green light will  flash 3-4 times. This will be your only indicator that the monitor has been turned on.  Do not shower for the first 24 hours. You may shower after the first 24 hours.  Press the button if you feel a symptom. You will hear a small click. Record Date, Time and  Symptom in the Patient Logbook.  When you are ready to remove the patch, follow instructions on the last 2 pages of Patient  Logbook. Stick patch monitor onto the last page of Patient Logbook.  Place Patient Logbook in the blue and white box. Use locking tab on box and tape box closed  securely. The blue and white box has prepaid postage on it. Please place it in the mailbox as  soon as possible. Your physician should have your test results approximately 7 days after the  monitor has been mailed back to [facility].  Call [facility] Customer Care at [phone] if you have questions regarding  your ZIO XT patch monitor. Call them immediately if you see an orange light blinking on your  monitor.  If your monitor falls off in less than 4 days, contact our Monitor department at [phone].  If your monitor becomes loose or falls off after 4 days call [employer] at [phone] for  suggestions on securing your monitor

## 2023-06-14 NOTE — Therapy (Signed)
OUTPATIENT PHYSICAL THERAPY EVALUATION   Patient Name: Megan Dixon MRN: [id number] DOB:November 10, 1966,57 y.o., female Today's Date: 06/15/2023   END OF SESSION:  PT End of Session - 06/15/23 0927     Visit Number 1    Number of Visits 17    Date for PT Re-Evaluation 08/14/23    Authorization Type UHC MCR/MCD    PT Start Time 0930    PT Stop Time 1015    PT Time Calculation (min) 45 min    Activity Tolerance Patient tolerated treatment well    Behavior During Therapy [facility] for tasks assessed/performed              Past Medical History:  Diagnosis Date   Arthritis    Depression    Fibromyalgia    Hypertension    Lupus (HCC)    a. was told bloodwork was possibly positive for lupus in 08/2018 by PCP.   Morbid obesity (HCC)    MS (multiple sclerosis) (HCC)    a. presumptive dx in [place], [place]   OCD (obsessive compulsive disorder)    OSA (obstructive sleep apnea)    a. dx recently, pending CPAP initiation.   Palpitations    Pneumonia 07/2018   left lung   Past Surgical History:  Procedure Laterality Date   ABDOMINAL SURGERY     BREAST EXCISIONAL BIOPSY Right    CARPAL TUNNEL RELEASE     HERNIA REPAIR     JOINT REPLACEMENT     VENTRAL HERNIA REPAIR N/A 08/09/2017   Procedure: Incarcerated ventral hernia repair with mesh ;  Surgeon: Emelia Loron, MD;  Location: [facility] OR;  Service: General;  Laterality: N/A;   Patient Active Problem List   Diagnosis Date Noted   Shortness of breath 11/23/2022   Congestion of nasal sinus 09/02/2021   Screening for cervical cancer 06/09/2021   Perineal pain in female 06/09/2021   Right wrist pain 01/24/2021   OSA (obstructive sleep apnea)    Palpitation 09/10/2020   Chronic diastolic heart failure (HCC) 09/10/2020   History of CVA (cerebrovascular accident) 09/10/2020   Congestive heart failure (HCC) 08/08/2020   Lower extremity edema 08/08/2020   Lower extremity pain, posterior, left 01/29/2020   Hyperlipidemia 01/09/2020    Depression 01/01/2020   Bilateral lower extremity pain 01/01/2020   Peripheral neuropathy 11/23/2019   Prediabetes 11/23/2019   Axillary lump, right 11/23/2019   Acute vestibular syndrome 08/29/2019   Ischemic vascular disease 08/29/2019   Migraine 08/29/2019   Morbid obesity (HCC)    Family history of systemic lupus erythematosus 09/10/2018   Anxiety disorder 09/07/2018   Hypertension 08/01/2018   Fibromyalgia 08/01/2018   Blood-tinged sputum 08/01/2018   Chronic low back pain 08/16/2017   Gastroesophageal reflux disease 08/16/2017   Incarcerated incisional hernia 08/10/2017   S/P exploratory laparotomy 08/09/2017    PCP: [facility]   REFERRING PROVIDER:   Billey Co, MD    REFERRING DIAG:  Diagnosis  M79.604,M79.605 (ICD-10-CM) - Bilateral lower extremity pain    Rationale for Evaluation and Treatment: Rehabilitation  THERAPY DIAG:  Pain in right leg  Pain in left leg  Other low back pain  Difficulty in walking, not elsewhere classified  Repeated falls  ONSET DATE: chronic   SUBJECTIVE:  SUBJECTIVE STATEMENT: Patient reports a long history of lower extremity and back pain for about 20 years. She was diagnosed with fibromyalgia years ago and a doctor in [place] told her she had MS, but a doctor here told her that she does not. She saw her cardiologist yesterday and was told she has "leaky veins" and reports intermittent swelling in BLE. She also has neuropathy in her feet that she attributes to her fibromyalgia. She reports her feet get hot and feels like she is walking on rocks. She reports the pain starts in the side and front of her hips and goes to the front of the legs down to her feet. She reports the pain is worsened with standing/walking  activity reporting she can only walk half a block. She reports occasional numbness in the lower extremities and shooting pain down the legs. She is on gabapentin, which has not helped with this pain. No changes in bowel/bladder and no saddle paraesthesias. She also reports multiple falls in the past 6 months due to leg weakness. She has undergone a TKA on the LLE years ago.  PERTINENT HISTORY:  Fibromyalgia Lupus Morbid obesity Multiple sclerosis?    PAIN:  Are you having pain? Yes: NPRS scale: >10/10 Pain location: anterior hips to lower legs Pain description: nagging Aggravating factors: standing,walking Relieving factors: relaxing   PRECAUTIONS: Fall  WEIGHT BEARING RESTRICTIONS: No  FALLS:  Has patient fallen in last 6 months? Yes. Number of falls 4 (feels it's due to weakness)  LIVING ENVIRONMENT: Lives with: lives with their family Lives in: House/apartment Stairs: Yes: External: 2 steps; none Has following equipment at home: Single point cane and Walker - 2 wheeled  OCCUPATION: disability   PLOF: Independent  PATIENT GOALS: "I want to get back walking without pain."    OBJECTIVE:   DIAGNOSTIC FINDINGS:  None   PATIENT SURVEYS:  FOTO 45% function to 60% predicted  COGNITION: Overall cognitive status: Within functional limits for tasks assessed     SENSATION: WFL   POSTURE: rounded shoulders and forward head  PALPATION: TTP bilateral greater trochanters, lumbar paraspinals, anterior lower legs   LUMBAR ROM:   AROM eval  Flexion WFL BLE pain  Extension WFL "feels good"   Right lateral flexion WNL bilateral hip pain  Left lateral flexion WNL hip pain  Right rotation WNL LBP  Left rotation WNL LBP   (Blank rows = not tested)  LOWER EXTREMITY MMT:     MMT  Right eval Left eval  Hip flexion 4 pn 4 pn  Hip extension    Hip abduction 4- pn 4- pn  Hip adduction    Hip internal rotation    Hip external rotation    Knee flexion 5 5  Knee  extension 5 5  Ankle dorsiflexion 5 5  Ankle plantarflexion 5 5  Ankle inversion    Ankle eversion 5 5   (Blank rows = not tested)  LOWER EXTREMITY ROM:    PROM Right eval Left eval  Hip flexion [facility]  Hip extension    Hip abduction    Hip adduction    Hip internal rotation Moderate limitation Moderate limitation  Hip external rotation Full Full   Knee flexion    Knee extension    Ankle dorsiflexion    Ankle plantarflexion    Ankle inversion    Ankle eversion     (Blank rows = not tested)  LUMBAR SPECIAL TESTS:  (-) SLR  FUNCTIONAL TESTS:  5 x STS:  19 seconds  BERG: 54/56   GAIT: Distance walked: 10 ft Assistive device utilized: None Level of assistance: Complete Independence Comments: excessive frontal plane movement, decreased step length, WBOS  [facility] Adult PT Treatment:                                                DATE: 06/15/23 Evaluation only     PATIENT EDUCATION:  Education details: see treatment Person educated: Patient Education method: Explanation, Demonstration, Tactile cues, Verbal cues, and Handouts Education comprehension: verbalized understanding, returned demonstration, verbal cues required, tactile cues required, and needs further education  HOME EXERCISE PROGRAM: No time to issue   ASSESSMENT:  CLINICAL IMPRESSION: Patient is a 57 y.o. female who was seen today for physical therapy evaluation and treatment for chronic low back and bilateral lower extremity pain. Her signs and symptoms appear multi-factorial as patient has various co-morbidities/diagnoses that could be contributing to her chronic pain. Upon assessment she is noted to have painful trunk AROM, bilateral hip weakness, poor standing tolerance due to pain, and gait abnormalities. She scored a 54/56 on the BERG balance assessment, so further testing is required at future sessions to better screen for fall risk as patient reports multiple falls in the past 6 months. She will benefit  from skilled PT to address the above stated deficits in order to optimize her function and assist in overall pain reduction.   OBJECTIVE IMPAIRMENTS: Abnormal gait, cardiopulmonary status limiting activity, decreased activity tolerance, decreased balance, decreased knowledge of condition, difficulty walking, decreased ROM, decreased strength, improper body mechanics, postural dysfunction, obesity, and pain.   ACTIVITY LIMITATIONS: carrying, lifting, bending, standing, squatting, stairs, locomotion level, and caring for others  PARTICIPATION LIMITATIONS: meal prep, cleaning, laundry, shopping, and community activity  PERSONAL FACTORS: Age, Fitness, Past/current experiences, Time since onset of injury/illness/exacerbation, and 3+ comorbidities: see PMH above  are also affecting patient's functional outcome.   REHAB POTENTIAL: Fair chronicity of condition;co-morbidities  CLINICAL DECISION MAKING: Unstable/unpredictable  EVALUATION COMPLEXITY: High   GOALS: Goals reviewed with patient? Yes  SHORT TERM GOALS: Target date: 07/13/2023    Patient will be independent and compliant with initial HEP.   Baseline: see above Goal status: INITIAL  2.  PT will complete further functional testing to assess balance/walking tolerance and set appropriate LTG.  Baseline: no time for further testing  Goal status: INITIAL  3.  Patient will complete 5 x STS in </=14 seconds to improve her functional strength.  Baseline: see above  Goal status: INITIAL  4.  Patient will report pain at worst rated as </=8/10 to reduce her current functional limitations.  Baseline: see above  Goal status: INITIAL  LONG TERM GOALS: Target date: 08/14/2023    Patient will score at least 60% on FOTO to signify clinically meaningful improvement in functional abilities.   Baseline: see above Goal status: INITIAL  2.  Patient will demonstrate 4+/5 bilateral hip strength to improve stability about the chain with  prolonged walking/standing activity.  Baseline: see above Goal status: INITIAL  3.  Patient will be able to tolerate at least 15 minutes of standing activity.  Baseline: during evaluation sits after 5 minutes of standing  Goal status: INITIAL  4.  Patient will be independent with advanced home program to assist in management of her chronic condition.  Baseline: see above Goal status: INITIAL  5.  Walking/balance goal TBD.  Baseline: N/A Goal status: TBD   PLAN:  PT FREQUENCY: 1-2x/week  PT DURATION: 8 weeks  PLANNED INTERVENTIONS: Therapeutic exercises, Therapeutic activity, Neuromuscular re-education, Balance training, Gait training, Patient/Family education, Self Care, Joint mobilization, Aquatic Therapy, Dry Needling, Cryotherapy, Moist heat, Manual therapy, and Re-evaluation.  PLAN FOR NEXT SESSION: issue HEP to include hip strengthening/core stabilization; further functional testing (consider BESTest, DGI, 6/2MWT)  Letitia Libra, PT, DPT, ATC 06/15/23 1:31 PM

## 2023-06-15 ENCOUNTER — Other Ambulatory Visit: Payer: Self-pay

## 2023-06-15 ENCOUNTER — Ambulatory Visit: Payer: 59 | Admitting: Student

## 2023-06-15 ENCOUNTER — Ambulatory Visit: Payer: 59 | Attending: Family Medicine

## 2023-06-15 DIAGNOSIS — R262 Difficulty in walking, not elsewhere classified: Secondary | ICD-10-CM | POA: Insufficient documentation

## 2023-06-15 DIAGNOSIS — M79605 Pain in left leg: Secondary | ICD-10-CM | POA: Diagnosis present

## 2023-06-15 DIAGNOSIS — R296 Repeated falls: Secondary | ICD-10-CM | POA: Diagnosis present

## 2023-06-15 DIAGNOSIS — M5459 Other low back pain: Secondary | ICD-10-CM | POA: Insufficient documentation

## 2023-06-15 DIAGNOSIS — M79604 Pain in right leg: Secondary | ICD-10-CM | POA: Diagnosis present

## 2023-06-22 ENCOUNTER — Ambulatory Visit: Payer: 59 | Admitting: Student

## 2023-06-22 NOTE — Progress Notes (Deleted)
  SUBJECTIVE:   CHIEF COMPLAINT / HPI:   Follow-up regarding bilateral lower extremity pain.  Last seen on 5/28 in which gabapentin dosage was decreased and Flexeril was discontinued.  She was referred for physical therapy eval.  PERTINENT  PMH / PSH: Fibromyalgia, HTN, HLD, chronic diastolic heart failure, anxiety, CVA, OSA  Patient Care Team: Program, [facility] as PCP - General Jodelle Red, MD as PCP - Cardiology (Cardiology) Jodelle Red, MD as Consulting Physician (Cardiology) OBJECTIVE:  There were no vitals taken for this visit. Physical Exam   ASSESSMENT/PLAN:  There are no diagnoses linked to this encounter. No follow-ups on file. [name], DO 06/22/2023, 7:41 AM PGY-3, [facility] {    This will disappear when note is signed, click to select method of visit    :1}

## 2023-06-23 ENCOUNTER — Ambulatory Visit: Payer: 59

## 2023-06-28 ENCOUNTER — Telehealth: Payer: Self-pay

## 2023-06-28 ENCOUNTER — Ambulatory Visit: Payer: 59

## 2023-06-28 NOTE — Telephone Encounter (Signed)
LVM regarding missed PT appointment. Reminded patient of next scheduled visit and reviewed attendance policy.   Letitia Libra, PT, DPT, ATC 06/28/23 1:09 PM

## 2023-06-30 ENCOUNTER — Other Ambulatory Visit: Payer: Self-pay | Admitting: Student

## 2023-06-30 DIAGNOSIS — Z1231 Encounter for screening mammogram for malignant neoplasm of breast: Secondary | ICD-10-CM

## 2023-07-05 ENCOUNTER — Ambulatory Visit: Payer: 59 | Admitting: Physical Therapy

## 2023-07-05 ENCOUNTER — Encounter: Payer: Self-pay | Admitting: Physical Therapy

## 2023-07-05 DIAGNOSIS — M79605 Pain in left leg: Secondary | ICD-10-CM

## 2023-07-05 DIAGNOSIS — M79604 Pain in right leg: Secondary | ICD-10-CM

## 2023-07-05 DIAGNOSIS — M5459 Other low back pain: Secondary | ICD-10-CM

## 2023-07-05 NOTE — Therapy (Addendum)
OUTPATIENT PHYSICAL THERAPY TREATMENT NOTE/DISCHARGE  PHYSICAL THERAPY DISCHARGE SUMMARY  Visits from Start of Care: 2  Current functional level related to goals / functional outcomes: See goals/objective   Remaining deficits: Unable to assess   Education / Equipment: HEP   Patient agrees to discharge. Patient goals were unable to assess. Patient is being discharged due to not returning since the last visit.     Patient Name: Megan Dixon MRN: [id number] DOB:[birth year],57 y.o., female Today's Date: 07/05/2023   END OF SESSION:  PT End of Session - 07/05/23 0717     Visit Number 2    Number of Visits 17    Date for PT Re-Evaluation 08/14/23    Authorization Type [facility] MCR/MCD    PT Start Time 0717    PT Stop Time 0757    PT Time Calculation (min) 40 min              Past Medical History:  Diagnosis Date   Arthritis    Depression    Fibromyalgia    Hypertension    Lupus (HCC)    a. was told bloodwork was possibly positive for lupus in 08/2018 by PCP.   Morbid obesity (HCC)    MS (multiple sclerosis) (HCC)    a. presumptive dx in [place], [place]   OCD (obsessive compulsive disorder)    OSA (obstructive sleep apnea)    a. dx recently, pending CPAP initiation.   Palpitations    Pneumonia 07/2018   left lung   Past Surgical History:  Procedure Laterality Date   ABDOMINAL SURGERY     BREAST EXCISIONAL BIOPSY Right    CARPAL TUNNEL RELEASE     HERNIA REPAIR     JOINT REPLACEMENT     VENTRAL HERNIA REPAIR N/A 08/09/2017   Procedure: Incarcerated ventral hernia repair with mesh ;  Surgeon: Emelia Loron, MD;  Location[facility];  Service: General;  Laterality: N/A;   Patient Active Problem List   Diagnosis Date Noted   Shortness of breath 11/23/2022   Congestion of nasal sinus 09/02/2021   Screening for cervical cancer 06/09/2021   Perineal pain in female 06/09/2021   Right wrist pain 01/24/2021   OSA (obstructive sleep apnea)    Palpitation  09/10/2020   Chronic diastolic heart failure (HCC) 09/10/2020   History of CVA (cerebrovascular accident) 09/10/2020   Congestive heart failure (HCC) 08/08/2020   Lower extremity edema 08/08/2020   Lower extremity pain, posterior, left 01/29/2020   Hyperlipidemia 01/09/2020   Depression 01/01/2020   Bilateral lower extremity pain 01/01/2020   Peripheral neuropathy 11/23/2019   Prediabetes 11/23/2019   Axillary lump, right 11/23/2019   Acute vestibular syndrome 08/29/2019   Ischemic vascular disease 08/29/2019   Migraine 08/29/2019   Morbid obesity (HCC)    Family history of systemic lupus erythematosus 09/10/2018   Anxiety disorder 09/07/2018   Hypertension 08/01/2018   Fibromyalgia 08/01/2018   Blood-tinged sputum 08/01/2018   Chronic low back pain 08/16/2017   Gastroesophageal reflux disease 08/16/2017   Incarcerated incisional hernia 08/10/2017   S/P exploratory laparotomy 08/09/2017    PCP: [facility]   REFERRING PROVIDER:   Billey Co, MD    REFERRING DIAG:  Diagnosis  M79.604,M79.605 (ICD-10-CM) - Bilateral lower extremity pain    Rationale for Evaluation and Treatment: Rehabilitation  THERAPY DIAG:  Pain in right leg  Pain in left leg  Other low back pain  ONSET DATE: chronic   SUBJECTIVE:  SUBJECTIVE STATEMENT: "A little achy this morning."    EVAL: Patient reports a long history of lower extremity and back pain for about 20 years. She was diagnosed with fibromyalgia years ago and a doctor in [place] told her she had MS, but a doctor here told her that she does not. She saw her cardiologist yesterday and was told she has "leaky veins" and reports intermittent swelling in BLE. She also has neuropathy in her feet that she attributes to her  fibromyalgia. She reports her feet get hot and feels like she is walking on rocks. She reports the pain starts in the side and front of her hips and goes to the front of the legs down to her feet. She reports the pain is worsened with standing/walking activity reporting she can only walk half a block. She reports occasional numbness in the lower extremities and shooting pain down the legs. She is on gabapentin, which has not helped with this pain. No changes in bowel/bladder and no saddle paraesthesias. She also reports multiple falls in the past 6 months due to leg weakness. She has undergone a TKA on the LLE years ago.  PERTINENT HISTORY:  Fibromyalgia Lupus Morbid obesity Multiple sclerosis?    PAIN:  Are you having pain? Yes: NPRS scale: 8/10 Pain location: anterior hips to lower legs Pain description: aching Aggravating factors: standing,walking Relieving factors: relaxing   PRECAUTIONS: Fall  WEIGHT BEARING RESTRICTIONS: No  FALLS:  Has patient fallen in last 6 months? Yes. Number of falls 4 (feels it's due to weakness)  LIVING ENVIRONMENT: Lives with: lives with their family Lives in: House/apartment Stairs: Yes: External: 2 steps; none Has following equipment at home: Single point cane and Walker - 2 wheeled  OCCUPATION: disability   PLOF: Independent  PATIENT GOALS: "I want to get back walking without pain."    OBJECTIVE:   DIAGNOSTIC FINDINGS:  None   PATIENT SURVEYS:  FOTO 45% function to 60% predicted  COGNITION: Overall cognitive status: Within functional limits for tasks assessed     SENSATION: WFL   POSTURE: rounded shoulders and forward head  PALPATION: TTP bilateral greater trochanters, lumbar paraspinals, anterior lower legs   LUMBAR ROM:   AROM eval  Flexion WFL BLE pain  Extension WFL "feels good"   Right lateral flexion WNL bilateral hip pain  Left lateral flexion WNL hip pain  Right rotation WNL LBP  Left rotation WNL LBP    (Blank rows = not tested)  LOWER EXTREMITY MMT:     MMT  Right eval Left eval  Hip flexion 4 pn 4 pn  Hip extension    Hip abduction 4- pn 4- pn  Hip adduction    Hip internal rotation    Hip external rotation    Knee flexion 5 5  Knee extension 5 5  Ankle dorsiflexion 5 5  Ankle plantarflexion 5 5  Ankle inversion    Ankle eversion 5 5   (Blank rows = not tested)  LOWER EXTREMITY ROM:    PROM Right eval Left eval  Hip flexion [facility]  Hip extension    Hip abduction    Hip adduction    Hip internal rotation Moderate limitation Moderate limitation  Hip external rotation Full Full   Knee flexion    Knee extension    Ankle dorsiflexion    Ankle plantarflexion    Ankle inversion    Ankle eversion     (Blank rows = not tested)  LUMBAR SPECIAL TESTS:  (-)  SLR  FUNCTIONAL TESTS:  5 x STS: 19 seconds  BERG: 54/56  07/05/23: DGI: 17/24 (difficulty with stairs, vertical head turns, LOB after stepping around obstacles )  07/05/23: 2 MWT: 338 feet, no AD  GAIT: Distance walked: 10 ft Assistive device utilized: None Level of assistance: Complete Independence Comments: excessive frontal plane movement, decreased step length, WBOS  [facility] Adult PT Treatment:                                                DATE: 07/05/23 Therapeutic Exercise: LTR PPT 5 sec x 10 PPT with march  Hip flexor stretch x 60 sec each  Hooklying clam  Green BAND  Therapeutic Activity: DGI: 17/24 2 MWT : 338 feet     [facility] Adult PT Treatment:                                                DATE: 06/15/23 Evaluation only     PATIENT EDUCATION:  Education details: see treatment Person educated: Patient Education method: Explanation, Demonstration, Tactile cues, Verbal cues, and Handouts Education comprehension: verbalized understanding, returned demonstration, verbal cues required, tactile cues required, and needs further education  HOME EXERCISE PROGRAM: No time to issue   Access Code:  [email or web address] URL: [email or web address] Date: 07/05/2023 Prepared by: Jannette Spanner  Exercises - Supine Lower Trunk Rotation  - 1 x daily - 7 x weekly - 1 sets - 10 reps - 5 hold - Pelvic tilt  - 1 x daily - 7 x weekly - 1 sets - 10 reps - 5 hold - Supine March with Posterior Pelvic Tilt  - 1 x daily - 7 x weekly - 1 sets - 10 reps - Modified Thomas Stretch  - 1 x daily - 7 x weekly - 1 sets - 1-2 reps - 30-60 hold - Hooklying Clamshell with Resistance  - 2 x daily - 7 x weekly - 2-3 sets - 10 reps - 5 hold  ASSESSMENT:  CLINICAL IMPRESSION: Pt reports achy pain in hips and thighs on arrival. It's been 3 weeks since her initial evaluation. Time spent with establishing initial HEP and functional testing. DGI score is 17/24 indicating increased fall risk. After 90 seconds of 2 MWT, she began to have increased pain in hips and demonstrated increased gait deviations due to pain. She was given an HEP to address core and hip strength as well as flexibility. She will benefit from skilled PT to address the above stated deficits in order to optimize her function and assist in overall pain reduction.   EVAL:  Patient is a 57 y.o. female who was seen today for physical therapy evaluation and treatment for chronic low back and bilateral lower extremity pain. Her signs and symptoms appear multi-factorial as patient has various co-morbidities/diagnoses that could be contributing to her chronic pain. Upon assessment she is noted to have painful trunk AROM, bilateral hip weakness, poor standing tolerance due to pain, and gait abnormalities. She scored a 54/56 on the BERG balance assessment, so further testing is required at future sessions to better screen for fall risk as patient reports multiple falls in the past 6 months. She will benefit from skilled PT to address the above stated deficits  in order to optimize her function and assist in overall pain reduction.    OBJECTIVE IMPAIRMENTS: Abnormal  gait, cardiopulmonary status limiting activity, decreased activity tolerance, decreased balance, decreased knowledge of condition, difficulty walking, decreased ROM, decreased strength, improper body mechanics, postural dysfunction, obesity, and pain.   ACTIVITY LIMITATIONS: carrying, lifting, bending, standing, squatting, stairs, locomotion level, and caring for others  PARTICIPATION LIMITATIONS: meal prep, cleaning, laundry, shopping, and community activity  PERSONAL FACTORS: Age, Fitness, Past/current experiences, Time since onset of injury/illness/exacerbation, and 3+ comorbidities: see PMH above  are also affecting patient's functional outcome.   REHAB POTENTIAL: Fair chronicity of condition;co-morbidities  CLINICAL DECISION MAKING: Unstable/unpredictable  EVALUATION COMPLEXITY: High   GOALS: Goals reviewed with patient? Yes  SHORT TERM GOALS: Target date: 07/13/2023    Patient will be independent and compliant with initial HEP Baseline: see above 07/05/23: issued initial HEP Goal status: ONGOING  2.  PT will complete further functional testing to assess balance/walking tolerance and set appropriate LTG.  Baseline: no time for further testing  07/05/23: completed DGI/ 2 MWT-see objective measures and set goal Goal status: PARTIALLY MET  3.  Patient will complete 5 x STS in </=14 seconds to improve her functional strength.  Baseline: see above  Goal status: INITIAL  4.  Patient will report pain at worst rated as </=8/10 to reduce her current functional limitations.  Baseline: see above  Goal status: INITIAL  LONG TERM GOALS: Target date: 08/14/2023    Patient will score at least 60% on FOTO to signify clinically meaningful improvement in functional abilities.   Baseline: see above Goal status: INITIAL  2.  Patient will demonstrate 4+/5 bilateral hip strength to improve stability about the chain with prolonged walking/standing activity.  Baseline: see above Goal  status: INITIAL  3.  Patient will be able to tolerate at least 15 minutes of standing activity.  Baseline: during evaluation sits after 5 minutes of standing  Goal status: INITIAL  4.  Patient will be independent with advanced home program to assist in management of her chronic condition.  Baseline: see above Goal status: INITIAL  5.  Walking/balance goal TBD.  Baseline: N/A Goal status: TBD   PLAN:  PT FREQUENCY: 1-2x/week  PT DURATION: 8 weeks  PLANNED INTERVENTIONS: Therapeutic exercises, Therapeutic activity, Neuromuscular re-education, Balance training, Gait training, Patient/Family education, Self Care, Joint mobilization, Aquatic Therapy, Dry Needling, Cryotherapy, Moist heat, Manual therapy, and Re-evaluation.  PLAN FOR NEXT SESSION: progress HEP PRN to include hip strengthening/core stabilization; further functional testing if needed (consider BESTest)   Jannette Spanner, PTA 07/05/23 8:21 AM Phone: [phone] Fax: [phone]

## 2023-07-07 NOTE — Progress Notes (Signed)
  SUBJECTIVE:   CHIEF COMPLAINT / HPI:   Left breast lumps: noticed it last week, there is one in particular that hurts. She has never felt them before. She feels like they have become lumpier over the week.   Leg pain: We have agreed to table this until next visit.  We performed a med rec today.  She has not been taking her statin medication.  PERTINENT  PMH / PSH: Fibromyalgia, HTN, HLD, chronic diastolic heart failure, anxiety, CVA, OSA  Patient Care Team: [name], DO as PCP - General (Family Medicine) Jodelle Red, MD as PCP - Cardiology (Cardiology) Jodelle Red, MD as Consulting Physician (Cardiology) OBJECTIVE:  BP 138/88   Pulse 80   Ht 5\' 1"  (1.549 m)   Wt 269 lb 12.8 oz (122.4 kg)   SpO2 98%   BMI 50.98 kg/m  General: Well-appearing, NAD Breast exam: Dense breast tissue bilaterally, unable to clearly appreciate lump even with patient guidance on left breast Chaperone: Jake Seats, CMA  ASSESSMENT/PLAN:  Mass of upper outer quadrant of left breast Assessment & Plan: Diagnostic mammogram and ultrasound ordered.  Orders: -     MM 3D DIAGNOSTIC MAMMOGRAM BILATERAL BREAST; Future  Bilateral lower extremity pain Assessment & Plan: Recommended gabapentin 300 mg 3 times daily, discontinuing Flexeril for the time being.  We will discuss this and swelling (?)  At next visit.   Hyperlipidemia, unspecified hyperlipidemia type Assessment & Plan: Restart statin as she was unaware that she should continue taking this.  Orders: -     Atorvastatin Calcium; Take 1 tablet (40 mg total) by mouth daily.  Dispense: 90 tablet; Refill: 3  Shortness of breath -     Albuterol Sulfate HFA; Inhale 1-2 puffs into the lungs every 6 (six) hours as needed for wheezing or shortness of breath.  Dispense: 8 g; Refill: 2  Morbid obesity (HCC) Assessment & Plan: She is successfully losing weight with increased exercise regimen.  Lost 7 pounds since last visit 2  months ago.  Congratulated on this and encouraged to continue.   Other orders -     Ondansetron; Take 1 tablet (4 mg total) by mouth every 8 (eight) hours as needed for nausea or vomiting.  Dispense: 20 tablet; Refill: 0  Return for Leg swelling. [name], DO 07/08/2023, 12:42 PM PGY-3, [facility]

## 2023-07-08 ENCOUNTER — Ambulatory Visit (INDEPENDENT_AMBULATORY_CARE_PROVIDER_SITE_OTHER): Payer: 59 | Admitting: Student

## 2023-07-08 ENCOUNTER — Other Ambulatory Visit: Payer: Self-pay | Admitting: Student

## 2023-07-08 ENCOUNTER — Encounter: Payer: Self-pay | Admitting: Student

## 2023-07-08 VITALS — BP 138/88 | HR 80 | Ht 61.0 in | Wt 269.8 lb

## 2023-07-08 DIAGNOSIS — N6321 Unspecified lump in the left breast, upper outer quadrant: Secondary | ICD-10-CM | POA: Diagnosis not present

## 2023-07-08 DIAGNOSIS — M79604 Pain in right leg: Secondary | ICD-10-CM

## 2023-07-08 DIAGNOSIS — M79605 Pain in left leg: Secondary | ICD-10-CM

## 2023-07-08 DIAGNOSIS — R0602 Shortness of breath: Secondary | ICD-10-CM

## 2023-07-08 DIAGNOSIS — E785 Hyperlipidemia, unspecified: Secondary | ICD-10-CM

## 2023-07-08 DIAGNOSIS — N63 Unspecified lump in unspecified breast: Secondary | ICD-10-CM | POA: Insufficient documentation

## 2023-07-08 MED ORDER — ALBUTEROL SULFATE HFA 108 (90 BASE) MCG/ACT IN AERS: 1.0000 | INHALATION_SPRAY | Freq: Four times a day (QID) | RESPIRATORY_TRACT | 2 refills | Status: DC | PRN

## 2023-07-08 MED ORDER — ATORVASTATIN CALCIUM 40 MG PO TABS: 40.0000 mg | ORAL_TABLET | Freq: Every day | ORAL | 3 refills | Status: DC

## 2023-07-08 MED ORDER — ONDANSETRON 4 MG PO TBDP: 4.0000 mg | ORAL_TABLET | Freq: Three times a day (TID) | ORAL | 0 refills | Status: DC | PRN

## 2023-07-08 NOTE — Patient Instructions (Signed)
It was great to see you today! Thank you for choosing [facility] for your primary care.   Today we addressed: I placed an order for a diagnostic mammogram given your concern of a left breast lump I have refilled some medications for you Lets discuss your leg swelling/pain at your next visit  If you haven't already, sign up for My Chart to have easy access to your labs results, and communication with your primary care physician.  You should return to our clinic Return for Leg swelling. Please arrive 15 minutes before your appointment to ensure smooth check in process.  We appreciate your efforts in making this happen.  Thank you for allowing me to participate in your care, [name], DO 07/08/2023, 10:31 AM PGY-3, [facility]

## 2023-07-08 NOTE — Assessment & Plan Note (Signed)
Diagnostic mammogram and ultrasound ordered

## 2023-07-08 NOTE — Assessment & Plan Note (Signed)
She is successfully losing weight with increased exercise regimen.  Lost 7 pounds since last visit 2 months ago.  Congratulated on this and encouraged to continue.

## 2023-07-08 NOTE — Assessment & Plan Note (Signed)
Restart statin as she was unaware that she should continue taking this.

## 2023-07-08 NOTE — Assessment & Plan Note (Signed)
>>  ASSESSMENT AND PLAN FOR BILATERAL LOWER EXTREMITY PAIN WRITTEN ON 07/08/2023 12:40 PM BY Foday [facility], DO  Recommended gabapentin 300 mg 3 times daily, discontinuing Flexeril for the time being.  We will discuss this and swelling (?)  At next visit.

## 2023-07-08 NOTE — Assessment & Plan Note (Signed)
Recommended gabapentin 300 mg 3 times daily, discontinuing Flexeril for the time being.  We will discuss this and swelling (?)  At next visit.

## 2023-07-12 ENCOUNTER — Ambulatory Visit: Payer: 59

## 2023-07-12 ENCOUNTER — Ambulatory Visit (INDEPENDENT_AMBULATORY_CARE_PROVIDER_SITE_OTHER): Payer: 59 | Admitting: Student

## 2023-07-12 VITALS — BP 134/79 | HR 78 | Wt 279.6 lb

## 2023-07-12 DIAGNOSIS — E785 Hyperlipidemia, unspecified: Secondary | ICD-10-CM | POA: Diagnosis not present

## 2023-07-12 DIAGNOSIS — I10 Essential (primary) hypertension: Secondary | ICD-10-CM | POA: Diagnosis not present

## 2023-07-12 DIAGNOSIS — R600 Localized edema: Secondary | ICD-10-CM

## 2023-07-12 DIAGNOSIS — R0602 Shortness of breath: Secondary | ICD-10-CM | POA: Diagnosis not present

## 2023-07-12 MED ORDER — ONDANSETRON 4 MG PO TBDP: 4.0000 mg | ORAL_TABLET | Freq: Three times a day (TID) | ORAL | 0 refills | Status: DC | PRN

## 2023-07-12 MED ORDER — LOSARTAN POTASSIUM 100 MG PO TABS: 100.0000 mg | ORAL_TABLET | Freq: Every day | ORAL | 0 refills | Status: DC

## 2023-07-12 MED ORDER — ATORVASTATIN CALCIUM 40 MG PO TABS: 40.0000 mg | ORAL_TABLET | Freq: Every day | ORAL | 3 refills | Status: DC

## 2023-07-12 MED ORDER — ALBUTEROL SULFATE HFA 108 (90 BASE) MCG/ACT IN AERS: 1.0000 | INHALATION_SPRAY | Freq: Four times a day (QID) | RESPIRATORY_TRACT | 2 refills | Status: DC | PRN

## 2023-07-12 NOTE — Patient Instructions (Addendum)
It was great to see you today! Thank you for choosing [facility] for your primary care. Megan Dixon was seen for lower extremity swelling.  Today we addressed: I have stopped her amlodipine as that can be affecting this.  Please attempt to pick up compression stockings at the pharmacy as I do believe this will greatly help you.  Lets get an updated echo.  Because I have stopped the amlodipine I have increased your losartan to 100 mg daily.  I have sent in a new medication prescription for this.  We should follow-up your labs in 1 week since we have increased your losartan.  Please make a lab appointment in 1 week.  If you haven't already, sign up for My Chart to have easy access to your labs results, and communication with your primary care physician.  You should return to our clinic Return in about 1 month (around 08/12/2023) for Lower extremity swelling follow-up. Please arrive 15 minutes before your appointment to ensure smooth check in process.  We appreciate your efforts in making this happen.  Thank you for allowing me to participate in your care, [name], DO 07/12/2023, 9:32 AM PGY-3, [facility]

## 2023-07-12 NOTE — Assessment & Plan Note (Signed)
BP: 134/79 today. Well controlled. Goal of <140/90. Continue to work on healthy dietary habits and exercise. Follow up in 1 month, lab check in 1 week.  Medication regimen: Discontinue amlodipine (see above for lower extremity edema).  Increase losartan 100 mg daily, continue Coreg 6.25 mg twice daily

## 2023-07-12 NOTE — Assessment & Plan Note (Addendum)
Bilateral, this has been a complaint in the past.  Considering venous stasis, worsened heart failure, secondary cause of amlodipine as highest on differential.  Less concern for DVT given bilateral and chronic nature.  Reviewed past echo 2021 with EF 50 to 55%.  Will investigate for these causes.  Return for follow-up in 1 month.  I have recommended compression stockings as well.  Unfortunately, this picture may be complicated by peripheral neuropathy, fibromyalgia

## 2023-07-12 NOTE — Progress Notes (Signed)
  SUBJECTIVE:   CHIEF COMPLAINT / HPI:   Leg swelling: Feels like both are swollen but in general R>L. She works for the elderly. She notes that her legs are less swollen in the morning after waking up. Sometimes she will put her legs up and put ice around which helps the swelling as well. Her legs bother her from the hips down. Sometimes she gets tingling like her feet are falling asleep.   PERTINENT  PMH / PSH: Fibromyalgia, HTN, HLD, chronic diastolic heart failure, anxiety, CVA, OSA   Patient Care Team: [name], DO as PCP - General (Family Medicine) Jodelle Red, MD as PCP - Cardiology (Cardiology) Jodelle Red, MD as Consulting Physician (Cardiology) OBJECTIVE:  BP 134/79   Pulse 78   Wt 279 lb 9.6 oz (126.8 kg)   SpO2 94%   BMI 52.83 kg/m  General: Well-appearing, NAD CV: RRR, murmurs auscultated Extremities: Trace pitting edema bilaterally past the knees, 2+ pedal pulse, sensation intact bilaterally, surgical scar on left anterior knee  ASSESSMENT/PLAN:  Lower extremity edema Assessment & Plan: Bilateral, this has been a complaint in the past.  Considering venous stasis, worsened heart failure, secondary cause of amlodipine as highest on differential.  Less concern for DVT given bilateral and chronic nature.  Reviewed past echo 2021 with EF 50 to 55%.  Will investigate for these causes.  Return for follow-up in 1 month.  I have recommended compression stockings as well.  Unfortunately, this picture may be complicated by peripheral neuropathy, fibromyalgia  Orders: -     ECHOCARDIOGRAM COMPLETE; Future  Primary hypertension Assessment & Plan: BP: 134/79 today. Well controlled. Goal of <140/90. Continue to work on healthy dietary habits and exercise. Follow up in 1 month, lab check in 1 week.  Medication regimen: Discontinue amlodipine (see above for lower extremity edema).  Increase losartan 100 mg daily, continue Coreg 6.25 mg twice  daily  Orders: -     Losartan Potassium; Take 1 tablet (100 mg total) by mouth daily.  Dispense: 30 tablet; Refill: 0 -     Basic metabolic panel; Future  Shortness of breath -     Albuterol Sulfate HFA; Inhale 1-2 puffs into the lungs every 6 (six) hours as needed for wheezing or shortness of breath.  Dispense: 8 g; Refill: 2  Hyperlipidemia, unspecified hyperlipidemia type -     Atorvastatin Calcium; Take 1 tablet (40 mg total) by mouth daily.  Dispense: 90 tablet; Refill: 3  Other orders -     Ondansetron; Take 1 tablet (4 mg total) by mouth every 8 (eight) hours as needed for nausea or vomiting.  Dispense: 20 tablet; Refill: 0  Return in about 1 month (around 08/12/2023) for Lower extremity swelling follow-up. [name], DO 07/12/2023, 9:44 AM PGY-3, [facility]

## 2023-07-13 ENCOUNTER — Ambulatory Visit
Admission: RE | Admit: 2023-07-13 | Discharge: 2023-07-13 | Disposition: A | Discharge status: Home / Self Care | Payer: 59 | Source: Ambulatory Visit | Attending: Family Medicine | Admitting: Family Medicine

## 2023-07-13 ENCOUNTER — Other Ambulatory Visit: Payer: Self-pay | Admitting: Student

## 2023-07-13 DIAGNOSIS — N6321 Unspecified lump in the left breast, upper outer quadrant: Secondary | ICD-10-CM

## 2023-07-13 DIAGNOSIS — N6310 Unspecified lump in the right breast, unspecified quadrant: Secondary | ICD-10-CM

## 2023-07-16 ENCOUNTER — Ambulatory Visit (HOSPITAL_COMMUNITY): Admission: RE | Admit: 2023-07-16 | Payer: 59 | Source: Ambulatory Visit

## 2023-07-19 ENCOUNTER — Ambulatory Visit: Payer: 59 | Admitting: Physical Therapy

## 2023-07-19 ENCOUNTER — Other Ambulatory Visit: Payer: 59

## 2023-07-19 ENCOUNTER — Telehealth: Payer: Self-pay

## 2023-07-19 NOTE — Telephone Encounter (Signed)
LVM regarding missed PT appointment. Reviewed attendance policy, informing patient that all future visits would be cancelled due to patient having 3 no-shows and could call back if she would like to schedule 1 visit at a time.   Letitia Libra, PT, DPT, ATC 07/19/23 8:46 AM

## 2023-07-23 ENCOUNTER — Other Ambulatory Visit: Payer: 59

## 2023-07-23 ENCOUNTER — Ambulatory Visit (INDEPENDENT_AMBULATORY_CARE_PROVIDER_SITE_OTHER): Payer: 59

## 2023-07-23 VITALS — Ht 61.0 in | Wt 279.0 lb

## 2023-07-23 DIAGNOSIS — Z0000 Encounter for general adult medical examination without abnormal findings: Secondary | ICD-10-CM

## 2023-07-23 DIAGNOSIS — I10 Essential (primary) hypertension: Secondary | ICD-10-CM | POA: Diagnosis not present

## 2023-07-24 NOTE — Progress Notes (Cosign Needed)
Subjective:   Megan Dixon is a 57 y.o. female who presents for an Initial Medicare Annual Wellness Visit.  Visit Complete: Virtual  I connected with  Megan Dixon on 07/23/23 by a audio enabled telemedicine application and verified that I am speaking with the correct person using two identifiers.  Patient Location: Home  Provider Location: Home Office  I discussed the limitations of evaluation and management by telemedicine. The patient expressed understanding and agreed to proceed.  Vital Signs: Unable to obtain new vitals due to this being a telehealth visit.  Review of Systems     Cardiac Risk Factors include: hypertension;sedentary lifestyle;obesity (BMI >30kg/m2)     Objective:    Today's Vitals   07/24/23 1952  Weight: 279 lb (126.6 kg)  Height: 5\' 1"  (1.549 m)   Body mass index is 52.72 kg/m.     07/24/2023    8:00 PM 07/12/2023    9:24 AM 07/08/2023    9:47 AM 06/15/2023    9:32 AM 05/11/2023    2:58 PM 12/12/2022    5:04 AM 11/23/2022    3:48 PM  Advanced Directives  Does Patient Have a Medical Advance Directive? No No No No No No No  Would patient like information on creating a medical advance directive? Yes (MAU/Ambulatory/Procedural Areas - Information given)   Yes (MAU/Ambulatory/Procedural Areas - Information given) No - Patient declined  No - Patient declined    Current Medications (verified) Outpatient Encounter Medications as of 07/23/2023  Medication Sig   albuterol (VENTOLIN HFA) 108 (90 Base) MCG/ACT inhaler Inhale 1-2 puffs into the lungs every 6 (six) hours as needed for wheezing or shortness of breath.   atorvastatin (LIPITOR) 40 MG tablet Take 1 tablet (40 mg total) by mouth daily.   carvedilol (COREG) 6.25 MG tablet Take 1 tablet (6.25 mg total) by mouth 2 (two) times daily with a meal.   clopidogrel (PLAVIX) 75 MG tablet Take 1 tablet (75 mg total) by mouth daily.   cyclobenzaprine (FLEXERIL) 10 MG tablet TAKE 1 TABLET BY MOUTH THREE  TIMES A DAY AS NEEDED FOR MUSCLE SPASM   Elastic Bandages & Supports (MEDICAL COMPRESSION STOCKINGS) MISC 1 Container by Does not apply route daily. Apply to both legs daily   FLUoxetine (PROZAC) 20 MG capsule TAKE 3 CAPSULES BY MOUTH EVERY DAY   gabapentin (NEURONTIN) 300 MG capsule Take 1 capsule (300 mg total) by mouth 3 (three) times daily as needed.   losartan (COZAAR) 100 MG tablet Take 1 tablet (100 mg total) by mouth daily.   ondansetron (ZOFRAN-ODT) 4 MG disintegrating tablet Take 1 tablet (4 mg total) by mouth every 8 (eight) hours as needed for nausea or vomiting.   pantoprazole (PROTONIX) 40 MG tablet TAKE 1 TABLET BY MOUTH EVERY DAY   No facility-administered encounter medications on file as of 07/23/2023.    Allergies (verified) Morphine and codeine, Other, Peanut-containing drug products, and Toradol [ketorolac tromethamine]   History: Past Medical History:  Diagnosis Date   Arthritis    Depression    Fibromyalgia    Hypertension    Lupus (HCC)    a. was told bloodwork was possibly positive for lupus in 08/2018 by PCP.   Morbid obesity (HCC)    MS (multiple sclerosis) (HCC)    a. presumptive dx in [place], [place]   OCD (obsessive compulsive disorder)    OSA (obstructive sleep apnea)    a. dx recently, pending CPAP initiation.   Palpitations    Pneumonia 07/2018  left lung   Past Surgical History:  Procedure Laterality Date   ABDOMINAL SURGERY     BREAST EXCISIONAL BIOPSY Right    CARPAL TUNNEL RELEASE     HERNIA REPAIR     JOINT REPLACEMENT     VENTRAL HERNIA REPAIR N/A 08/09/2017   Procedure: Incarcerated ventral hernia repair with mesh ;  Surgeon: Emelia Loron, MD;  Location: [facility] OR;  Service: General;  Laterality: N/A;   Family History  Problem Relation Age of Onset   Cancer Mother        Leukemia   Lung cancer Father    Cancer Sister        Leukemia   Lupus Sister    Lung cancer Brother    Seizures Brother    Heart attack Brother         Happened during seizure   Social History   Socioeconomic History   Marital status: Single    Spouse name: Not on file   Number of children: Not on file   Years of education: Not on file   Highest education level: Not on file  Occupational History   Not on file  Tobacco Use   Smoking status: Former    Types: Cigarettes   Smokeless tobacco: Never   Tobacco comments:    Smoked for 40 years, quit in 08/2018  Vaping Use   Vaping status: Never Used  Substance and Sexual Activity   Alcohol use: Not Currently   Drug use: Never   Sexual activity: Not Currently  Other Topics Concern   Not on file  Social History Narrative   Not on file   Social Determinants of Health   Financial Resource Strain: Low Risk  (07/24/2023)   Overall Financial Resource Strain (CARDIA)    Difficulty of Paying Living Expenses: Not hard at all  Food Insecurity: No Food Insecurity (07/24/2023)   Hunger Vital Sign    Worried About Running Out of Food in the Last Year: Never true    Ran Out of Food in the Last Year: Never true  Transportation Needs: No Transportation Needs (07/24/2023)   PRAPARE - Administrator, Civil Service (Medical): No    Lack of Transportation (Non-Medical): No  Physical Activity: Inactive (07/24/2023)   Exercise Vital Sign    Days of Exercise per Week: 0 days    Minutes of Exercise per Session: 0 min  Stress: No Stress Concern Present (07/24/2023)   [employer] of Occupational Health - Occupational Stress Questionnaire    Feeling of Stress : Not at all  Social Connections: Socially Isolated (07/24/2023)   Social Connection and Isolation Panel [NHANES]    Frequency of Communication with Friends and Family: More than three times a week    Frequency of Social Gatherings with Friends and Family: Three times a week    Attends Religious Services: Never    Active Member of Clubs or Organizations: No    Attends Banker Meetings: Never    Marital Status: Never  married    Tobacco Counseling Counseling given: Not Answered Tobacco comments: Smoked for 40 years, quit in 08/2018   Clinical Intake:  Pre-visit preparation completed: Yes  Pain : No/denies pain     Diabetes: No  How often do you need to have someone help you when you read instructions, pamphlets, or other written materials from your doctor or pharmacy?: 1 - Never  Interpreter Needed?: No  Information entered by :: Kandis Fantasia LPN  Activities of Daily Living    07/24/2023    7:59 PM  In your present state of health, do you have any difficulty performing the following activities:  Hearing? 0  Vision? 0  Difficulty concentrating or making decisions? 0  Walking or climbing stairs? 0  Dressing or bathing? 0  Doing errands, shopping? 0  Preparing Food and eating ? N  Using the Toilet? N  In the past six months, have you accidently leaked urine? N  Do you have problems with loss of bowel control? N  Managing your Medications? N  Managing your Finances? N  Housekeeping or managing your Housekeeping? N    Patient Care Team: [name], DO as PCP - General (Family Medicine) Jodelle Red, MD as PCP - Cardiology (Cardiology) Jodelle Red, MD as Consulting Physician (Cardiology)  Indicate any recent Medical Services you may have received from other than [facility] providers in the past year (date may be approximate).     Assessment:   This is a routine wellness examination for Reyno.  Hearing/Vision screen Hearing Screening - Comments:: Denies hearing difficulties   Vision Screening - Comments:: No vision problems; will schedule routine eye exam soon    Dietary issues and exercise activities discussed:     Goals Addressed             This Visit's Progress    Remain active and independent        Depression Screen    07/24/2023    7:56 PM 07/12/2023    9:24 AM 07/08/2023    9:52 AM 05/11/2023    3:01 PM 11/23/2022    3:50 PM  05/01/2022    2:24 PM 09/01/2021   11:38 AM  PHQ 2/9 Scores  PHQ - 2 Score 0 0 0 0 0 0 0  PHQ- 9 Score 2 2 4  0 0 0 0    Fall Risk    07/24/2023    7:59 PM 09/30/2020    1:25 PM 08/06/2020    2:08 PM 01/01/2020    2:12 PM 11/23/2019    2:35 PM  Fall Risk   Falls in the past year? 0 0 0 0 0  Number falls in past yr: 0  0 0 0  Injury with Fall? 0  0 0 1  Risk for fall due to : No Fall Risks      Follow up Falls prevention discussed;Education provided;Falls evaluation completed        MEDICARE RISK AT HOME:  Medicare Risk at Home - 07/24/23 1959     Any stairs in or around the home? No    If so, are there any without handrails? No    Home free of loose throw rugs in walkways, pet beds, electrical cords, etc? Yes    Adequate lighting in your home to reduce risk of falls? Yes    Life alert? No    Use of a cane, walker or w/c? No    Grab bars in the bathroom? Yes    Shower chair or bench in shower? No    Elevated toilet seat or a handicapped toilet? No             TIMED UP AND GO:  Was the test performed? No    Cognitive Function:        07/24/2023    8:00 PM  6CIT Screen  What Year? 0 points  What month? 0 points  What time? 0 points  Count back from 20  0 points  Months in reverse 0 points  Repeat phrase 0 points  Total Score 0 points    Immunizations Immunization History  Administered Date(s) Administered   PFIZER(Purple Top)SARS-COV-2 Vaccination 02/17/2020, 03/09/2020   Zoster Recombinant(Shingrix) 04/07/2019, 07/06/2019    TDAP status: Due, Education has been provided regarding the importance of this vaccine. Advised may receive this vaccine at local pharmacy or Health Dept. Aware to provide a copy of the vaccination record if obtained from local pharmacy or Health Dept. Verbalized acceptance and understanding.  Flu Vaccine status: Declined, Education has been provided regarding the importance of this vaccine but patient still declined. Advised may  receive this vaccine at local pharmacy or Health Dept. Aware to provide a copy of the vaccination record if obtained from local pharmacy or Health Dept. Verbalized acceptance and understanding.  Pneumococcal vaccine status: Up to date  Covid-19 vaccine status: Information provided on how to obtain vaccines.   Qualifies for Shingles Vaccine? Yes   Zostavax completed No   Shingrix Completed?: Yes  Screening Tests Health Maintenance  Topic Date Due   DTaP/Tdap/Td (1 - Tdap) Never done   PAP SMEAR-Modifier  06/09/2022   COVID-19 Vaccine (3 - 2023-24 season) 08/14/2022   INFLUENZA VACCINE  07/15/2023   Medicare Annual Wellness (AWV)  07/22/2024   MAMMOGRAM  07/12/2025   Colonoscopy  11/12/2027   Hepatitis C Screening  Completed   HIV Screening  Completed   Zoster Vaccines- Shingrix  Completed   HPV VACCINES  Aged Out    Health Maintenance  Health Maintenance Due  Topic Date Due   DTaP/Tdap/Td (1 - Tdap) Never done   PAP SMEAR-Modifier  06/09/2022   COVID-19 Vaccine (3 - 2023-24 season) 08/14/2022   INFLUENZA VACCINE  07/15/2023    Colorectal cancer screening: Type of screening: Colonoscopy. Completed 11/11/17. Repeat every 10 years  Mammogram status: Completed 07/13/23. Repeat every year  Lung Cancer Screening: (Low Dose CT Chest recommended if Age 65-80 years, 20 pack-year currently smoking OR have quit w/in 15years.) does not qualify.   Lung Cancer Screening Referral: n/a  Additional Screening:  Hepatitis C Screening: does qualify; Completed 05/01/22  Vision Screening: Recommended annual ophthalmology exams for early detection of glaucoma and other disorders of the eye. Is the patient up to date with their annual eye exam?  No  Who is the provider or what is the name of the office in which the patient attends annual eye exams? none If pt is not established with a provider, would they like to be referred to a provider to establish care? No .   Dental Screening:  Recommended annual dental exams for proper oral hygiene  Community Resource Referral / Chronic Care Management: CRR required this visit?  No   CCM required this visit?  No     Plan:     I have personally reviewed and noted the following in the patient's chart:   Medical and social history Use of alcohol, tobacco or illicit drugs  Current medications and supplements including opioid prescriptions. Patient is not currently taking opioid prescriptions. Functional ability and status Nutritional status Physical activity Advanced directives List of other physicians Hospitalizations, surgeries, and ER visits in previous 12 months Vitals Screenings to include cognitive, depression, and falls Referrals and appointments  In addition, I have reviewed and discussed with patient certain preventive protocols, quality metrics, and best practice recommendations. A written personalized care plan for preventive services as well as general preventive health recommendations were provided to patient.  Kandis Fantasia [place], [place]   [id number]   After Visit Summary: (Mail) Due to this being a telephonic visit, the after visit summary with patients personalized plan was offered to patient via mail   Nurse Notes: No concerns at this time

## 2023-07-24 NOTE — Patient Instructions (Signed)
Megan Dixon , Thank you for taking time to come for your Medicare Wellness Visit. I appreciate your ongoing commitment to your health goals. Please review the following plan we discussed and let me know if I can assist you in the future.   Referrals/Orders/Follow-Ups/Clinician Recommendations: Aim for 30 minutes of exercise or brisk walking, 6-8 glasses of water, and 5 servings of fruits and vegetables each day.  This is a list of the screening recommended for you and due dates:  Health Maintenance  Topic Date Due   DTaP/Tdap/Td vaccine (1 - Tdap) Never done   Pap Smear  06/09/2022   COVID-19 Vaccine (3 - 2023-24 season) 08/14/2022   Flu Shot  07/15/2023   Medicare Annual Wellness Visit  07/22/2024   Mammogram  07/12/2025   Colon Cancer Screening  11/12/2027   Hepatitis C Screening  Completed   HIV Screening  Completed   Zoster (Shingles) Vaccine  Completed   HPV Vaccine  Aged Out    Advanced directives: (ACP Link)Information on Advanced Care Planning can be found at [facility] Advance Health Care Directives Advance Health Care Directives ([email or web address]   Next Medicare Annual Wellness Visit scheduled for next year: Yes  Preventive Care 40-64 Years, Female Preventive care refers to lifestyle choices and visits with your health care provider that can promote health and wellness. What does preventive care include? A yearly physical exam. This is also called an annual well check. Dental exams once or twice a year. Routine eye exams. Ask your health care provider how often you should have your eyes checked. Personal lifestyle choices, including: Daily care of your teeth and gums. Regular physical activity. Eating a healthy diet. Avoiding tobacco and drug use. Limiting alcohol use. Practicing safe sex. Taking low-dose aspirin daily starting at age 56. Taking vitamin and mineral supplements as recommended by your health care provider. What happens during an annual  well check? The services and screenings done by your health care provider during your annual well check will depend on your age, overall health, lifestyle risk factors, and family history of disease. Counseling  Your health care provider may ask you questions about your: Alcohol use. Tobacco use. Drug use. Emotional well-being. Home and relationship well-being. Sexual activity. Eating habits. Work and work Astronomer. Method of birth control. Menstrual cycle. Pregnancy history. Screening  You may have the following tests or measurements: Height, weight, and BMI. Blood pressure. Lipid and cholesterol levels. These may be checked every 5 years, or more frequently if you are over 19 years old. Skin check. Lung cancer screening. You may have this screening every year starting at age 74 if you have a 30-pack-year history of smoking and currently smoke or have quit within the past 15 years. Fecal occult blood test (FOBT) of the stool. You may have this test every year starting at age 87. Flexible sigmoidoscopy or colonoscopy. You may have a sigmoidoscopy every 5 years or a colonoscopy every 10 years starting at age 46. Hepatitis C blood test. Hepatitis B blood test. Sexually transmitted disease (STD) testing. Diabetes screening. This is done by checking your blood sugar (glucose) after you have not eaten for a while (fasting). You may have this done every 1-3 years. Mammogram. This may be done every 1-2 years. Talk to your health care provider about when you should start having regular mammograms. This may depend on whether you have a family history of breast cancer. BRCA-related cancer screening. This may be done if you have a  family history of breast, ovarian, tubal, or peritoneal cancers. Pelvic exam and Pap test. This may be done every 3 years starting at age 57. Starting at age 31, this may be done every 5 years if you have a Pap test in combination with an HPV test. Bone density scan.  This is done to screen for osteoporosis. You may have this scan if you are at high risk for osteoporosis. Discuss your test results, treatment options, and if necessary, the need for more tests with your health care provider. Vaccines  Your health care provider may recommend certain vaccines, such as: Influenza vaccine. This is recommended every year. Tetanus, diphtheria, and acellular pertussis (Tdap, Td) vaccine. You may need a Td booster every 10 years. Zoster vaccine. You may need this after age 27. Pneumococcal 13-valent conjugate (PCV13) vaccine. You may need this if you have certain conditions and were not previously vaccinated. Pneumococcal polysaccharide (PPSV23) vaccine. You may need one or two doses if you smoke cigarettes or if you have certain conditions. Talk to your health care provider about which screenings and vaccines you need and how often you need them. This information is not intended to replace advice given to you by your health care provider. Make sure you discuss any questions you have with your health care provider. Document Released: 12/27/2015 Document Revised: 08/19/2016 Document Reviewed: 10/01/2015 Elsevier Interactive Patient Education  2017 [employer].    Fall Prevention in the Home Falls can cause injuries. They can happen to people of all ages. There are many things you can do to make your home safe and to help prevent falls. What can I do on the outside of my home? Regularly fix the edges of walkways and driveways and fix any cracks. Remove anything that might make you trip as you walk through a door, such as a raised step or threshold. Trim any bushes or trees on the path to your home. Use bright outdoor lighting. Clear any walking paths of anything that might make someone trip, such as rocks or tools. Regularly check to see if handrails are loose or broken. Make sure that both sides of any steps have handrails. Any raised decks and porches should have  guardrails on the edges. Have any leaves, snow, or ice cleared regularly. Use sand or salt on walking paths during winter. Clean up any spills in your garage right away. This includes oil or grease spills. What can I do in the bathroom? Use night lights. Install grab bars by the toilet and in the tub and shower. Do not use towel bars as grab bars. Use non-skid mats or decals in the tub or shower. If you need to sit down in the shower, use a plastic, non-slip stool. Keep the floor dry. Clean up any water that spills on the floor as soon as it happens. Remove soap buildup in the tub or shower regularly. Attach bath mats securely with double-sided non-slip rug tape. Do not have throw rugs and other things on the floor that can make you trip. What can I do in the bedroom? Use night lights. Make sure that you have a light by your bed that is easy to reach. Do not use any sheets or blankets that are too big for your bed. They should not hang down onto the floor. Have a firm chair that has side arms. You can use this for support while you get dressed. Do not have throw rugs and other things on the floor that can make  you trip. What can I do in the kitchen? Clean up any spills right away. Avoid walking on wet floors. Keep items that you use a lot in easy-to-reach places. If you need to reach something above you, use a strong step stool that has a grab bar. Keep electrical cords out of the way. Do not use floor polish or wax that makes floors slippery. If you must use wax, use non-skid floor wax. Do not have throw rugs and other things on the floor that can make you trip. What can I do with my stairs? Do not leave any items on the stairs. Make sure that there are handrails on both sides of the stairs and use them. Fix handrails that are broken or loose. Make sure that handrails are as long as the stairways. Check any carpeting to make sure that it is firmly attached to the stairs. Fix any carpet  that is loose or worn. Avoid having throw rugs at the top or bottom of the stairs. If you do have throw rugs, attach them to the floor with carpet tape. Make sure that you have a light switch at the top of the stairs and the bottom of the stairs. If you do not have them, ask someone to add them for you. What else can I do to help prevent falls? Wear shoes that: Do not have high heels. Have rubber bottoms. Are comfortable and fit you well. Are closed at the toe. Do not wear sandals. If you use a stepladder: Make sure that it is fully opened. Do not climb a closed stepladder. Make sure that both sides of the stepladder are locked into place. Ask someone to hold it for you, if possible. Clearly mark and make sure that you can see: Any grab bars or handrails. First and last steps. Where the edge of each step is. Use tools that help you move around (mobility aids) if they are needed. These include: Canes. Walkers. Scooters. Crutches. Turn on the lights when you go into a dark area. Replace any light bulbs as soon as they burn out. Set up your furniture so you have a clear path. Avoid moving your furniture around. If any of your floors are uneven, fix them. If there are any pets around you, be aware of where they are. Review your medicines with your doctor. Some medicines can make you feel dizzy. This can increase your chance of falling. Ask your doctor what other things that you can do to help prevent falls. This information is not intended to replace advice given to you by your health care provider. Make sure you discuss any questions you have with your health care provider. Document Released: 09/26/2009 Document Revised: 05/07/2016 Document Reviewed: 01/04/2015 Elsevier Interactive Patient Education  2017 [employer].

## 2023-07-26 ENCOUNTER — Ambulatory Visit: Payer: 59 | Admitting: Physical Therapy

## 2023-07-26 ENCOUNTER — Encounter: Payer: Self-pay | Admitting: Student

## 2023-07-27 ENCOUNTER — Ambulatory Visit (HOSPITAL_COMMUNITY): Admission: RE | Admit: 2023-07-27 | Payer: 59 | Source: Ambulatory Visit

## 2023-07-27 ENCOUNTER — Ambulatory Visit (HOSPITAL_COMMUNITY)
Admission: RE | Admit: 2023-07-27 | Discharge: 2023-07-27 | Disposition: A | Discharge status: Home / Self Care | Payer: 59 | Source: Ambulatory Visit | Attending: Family Medicine | Admitting: Family Medicine

## 2023-07-27 DIAGNOSIS — I10 Essential (primary) hypertension: Secondary | ICD-10-CM | POA: Insufficient documentation

## 2023-07-27 DIAGNOSIS — G4730 Sleep apnea, unspecified: Secondary | ICD-10-CM | POA: Diagnosis not present

## 2023-07-27 DIAGNOSIS — R0602 Shortness of breath: Secondary | ICD-10-CM | POA: Diagnosis not present

## 2023-07-27 DIAGNOSIS — R600 Localized edema: Secondary | ICD-10-CM | POA: Diagnosis not present

## 2023-07-27 DIAGNOSIS — E785 Hyperlipidemia, unspecified: Secondary | ICD-10-CM | POA: Diagnosis not present

## 2023-07-27 LAB — ECHOCARDIOGRAM COMPLETE
AR max vel: 2.11 cm2
AV Area VTI: 2.14 cm2
AV Area mean vel: 2.09 cm2
AV Mean grad: 5 mmHg
AV Peak grad: 9.9 mmHg
Ao pk vel: 1.57 m/s
Area-P 1/2: 3.46 cm2
S' Lateral: 3.2 cm

## 2023-07-29 ENCOUNTER — Encounter: Payer: Self-pay | Admitting: Student

## 2023-08-02 ENCOUNTER — Ambulatory Visit: Payer: 59 | Admitting: Physical Therapy

## 2023-08-03 ENCOUNTER — Other Ambulatory Visit: Payer: Self-pay | Admitting: Student

## 2023-08-03 DIAGNOSIS — I10 Essential (primary) hypertension: Secondary | ICD-10-CM

## 2023-08-06 ENCOUNTER — Ambulatory Visit (INDEPENDENT_AMBULATORY_CARE_PROVIDER_SITE_OTHER): Payer: 59 | Admitting: Student

## 2023-08-06 ENCOUNTER — Ambulatory Visit
Admission: RE | Admit: 2023-08-06 | Discharge: 2023-08-06 | Disposition: A | Discharge status: Home / Self Care | Payer: 59 | Source: Ambulatory Visit | Attending: Family Medicine | Admitting: Family Medicine

## 2023-08-06 VITALS — BP 176/102 | Temp 98.4°F | Ht 61.0 in | Wt 266.4 lb

## 2023-08-06 DIAGNOSIS — R051 Acute cough: Secondary | ICD-10-CM

## 2023-08-06 DIAGNOSIS — R059 Cough, unspecified: Secondary | ICD-10-CM | POA: Diagnosis not present

## 2023-08-06 DIAGNOSIS — U071 COVID-19: Secondary | ICD-10-CM | POA: Diagnosis not present

## 2023-08-06 DIAGNOSIS — R509 Fever, unspecified: Secondary | ICD-10-CM | POA: Diagnosis not present

## 2023-08-06 LAB — POC SOFIA SARS ANTIGEN FIA: SARS Coronavirus 2 Ag: POSITIVE — AB

## 2023-08-06 MED ORDER — BENZONATATE 100 MG PO CAPS
100.0000 mg | ORAL_CAPSULE | Freq: Two times a day (BID) | ORAL | 0 refills | Status: DC | PRN
Start: 2023-08-06 — End: 2023-10-14

## 2023-08-06 NOTE — Patient Instructions (Addendum)
It was great to see you! Thank you for allowing me to participate in your care!   I recommend that you always bring your medications to each appointment as this makes it easy to ensure we are on the correct medications and helps [place] not miss when refills are needed.  Our plans for today:  - Please go to [facility] for a chest X-ray, I will call you with the results - I have sent tessalon for cough to your pharmacy - Take tylenol and ibuprofen for cough and pain - Please stay hydrated - If you experience shortness of breath and feel like you cannot breath, please seek immediate care at emergency room   Take care and seek immediate care sooner if you develop any concerns. Please remember to show up 15 minutes before your scheduled appointment time!  Tiffany Kocher, DO [facility]

## 2023-08-06 NOTE — Progress Notes (Signed)
    SUBJECTIVE:   CHIEF COMPLAINT / HPI:   Cough Symptoms started 2 days ago, cough/fever/chills/abdominal pain/rib pain.  Took at home COVID-19 test which was negative.  No shortness of breath, exertional dyspnea, NVD.  Highest temperature of 101 at home.  No sick contacts.  She does not work and does not need a work note today.  PERTINENT  PMH / PSH: CHF, history of CVA, former smoker  OBJECTIVE:   BP (!) 176/102   Temp 98.4 F (36.9 C)   Ht 5\' 1"  (1.549 m)   Wt 266 lb 6.4 oz (120.8 kg)   SpO2 96%   BMI 50.34 kg/m    General: NAD, pleasant, able to participate during exam HEENT: Normocephalic, atraumatic head. Normal external ear, canal, TM bilaterally. EOM intact and normal conjunctiva BL. Normal external nose. Throat not erythematous, no exudate, no deviation. Cardio: RRR, no MRG.  No extremity edema.  Walking without dyspnea. Respiratory: Limited by body habitus.  Diminished breath sounds on left base posteriorly, intermittent wheezing in upper airways that clears after cough.  Normal work of breathing on room air. Skin: Warm and dry  ASSESSMENT/PLAN:  COVID-19 Patient positive for COVID in office today.  Symptoms consistent with a COVID-pneumonia.  Due to diminished breath sounds with CHF history will obtain x-ray.  Patient appears euvolemic on exam.  Using shared decision making, will not treat with Paxlovid d/t medication interactions.  Will treat symptomatically.  ED precautions reviewed. - Tessalon Perles - Follow-up x-ray results  Addendum Independently reviewed CXR imaging.  Called patient to discuss results.  No acute cardiopulmonary disease, otherwise normal x-ray.  Continue current treatment.  Patient expressed understanding and agreement with plan.  Tiffany Kocher, DO [facility]

## 2023-08-08 ENCOUNTER — Emergency Department (HOSPITAL_COMMUNITY): Payer: 59

## 2023-08-08 ENCOUNTER — Emergency Department (HOSPITAL_COMMUNITY)
Admission: EM | Admit: 2023-08-08 | Discharge: 2023-08-08 | Disposition: A | Discharge status: Home / Self Care | Payer: 59 | Attending: Emergency Medicine | Admitting: Emergency Medicine

## 2023-08-08 ENCOUNTER — Other Ambulatory Visit: Payer: Self-pay

## 2023-08-08 DIAGNOSIS — Z79899 Other long term (current) drug therapy: Secondary | ICD-10-CM | POA: Insufficient documentation

## 2023-08-08 DIAGNOSIS — R079 Chest pain, unspecified: Secondary | ICD-10-CM | POA: Diagnosis not present

## 2023-08-08 DIAGNOSIS — I10 Essential (primary) hypertension: Secondary | ICD-10-CM | POA: Diagnosis not present

## 2023-08-08 DIAGNOSIS — Z91010 Allergy to peanuts: Secondary | ICD-10-CM | POA: Diagnosis not present

## 2023-08-08 DIAGNOSIS — R0902 Hypoxemia: Secondary | ICD-10-CM | POA: Diagnosis not present

## 2023-08-08 DIAGNOSIS — Z7902 Long term (current) use of antithrombotics/antiplatelets: Secondary | ICD-10-CM | POA: Diagnosis not present

## 2023-08-08 DIAGNOSIS — U071 COVID-19: Secondary | ICD-10-CM | POA: Diagnosis not present

## 2023-08-08 DIAGNOSIS — R6889 Other general symptoms and signs: Secondary | ICD-10-CM | POA: Diagnosis not present

## 2023-08-08 DIAGNOSIS — R0602 Shortness of breath: Secondary | ICD-10-CM | POA: Diagnosis not present

## 2023-08-08 DIAGNOSIS — Z743 Need for continuous supervision: Secondary | ICD-10-CM | POA: Diagnosis not present

## 2023-08-08 DIAGNOSIS — R918 Other nonspecific abnormal finding of lung field: Secondary | ICD-10-CM | POA: Diagnosis not present

## 2023-08-08 DIAGNOSIS — I700 Atherosclerosis of aorta: Secondary | ICD-10-CM | POA: Diagnosis not present

## 2023-08-08 LAB — TROPONIN I (HIGH SENSITIVITY)
Troponin I (High Sensitivity): 10 ng/L (ref <18)
Troponin I (High Sensitivity): 10 ng/L (ref <18)

## 2023-08-08 LAB — BASIC METABOLIC PANEL
Anion gap: 10 (ref 5–15)
BUN: 6 mg/dL (ref 6–20)
CO2: 27 mmol/L (ref 22–32)
Calcium: 8.5 mg/dL — ABNORMAL LOW (ref 8.9–10.3)
Chloride: 104 mmol/L (ref 98–111)
Creatinine, Ser: 0.72 mg/dL (ref 0.44–1.00)
GFR, Estimated: >60 mL/min (ref >60)
Glucose, Bld: 102 mg/dL — ABNORMAL HIGH (ref 70–99)
Potassium: 3.2 mmol/L — ABNORMAL LOW (ref 3.5–5.1)
Sodium: 141 mmol/L (ref 135–145)

## 2023-08-08 LAB — BRAIN NATRIURETIC PEPTIDE: B Natriuretic Peptide: 120.2 pg/mL — ABNORMAL HIGH (ref 0.0–100.0)

## 2023-08-08 LAB — URINALYSIS, ROUTINE W REFLEX MICROSCOPIC
Bilirubin Urine: NEGATIVE
Glucose, UA: NEGATIVE mg/dL
Hgb urine dipstick: NEGATIVE
Ketones, ur: NEGATIVE mg/dL
Leukocytes,Ua: NEGATIVE
Nitrite: NEGATIVE
Protein, ur: NEGATIVE mg/dL
Specific Gravity, Urine: 1.01 (ref 1.005–1.030)
pH: 8 (ref 5.0–8.0)

## 2023-08-08 LAB — CBC
HCT: 45.5 % (ref 36.0–46.0)
Hemoglobin: 14.4 g/dL (ref 12.0–15.0)
MCH: 28.2 pg (ref 26.0–34.0)
MCHC: 31.6 g/dL (ref 30.0–36.0)
MCV: 89.2 fL (ref 80.0–100.0)
Platelets: 173 10*3/uL (ref 150–400)
RBC: 5.1 MIL/uL (ref 3.87–5.11)
RDW: 14.7 % (ref 11.5–15.5)
WBC: 7.9 10*3/uL (ref 4.0–10.5)
nRBC: 0 % (ref 0.0–0.2)

## 2023-08-08 MED ORDER — ACETAMINOPHEN 325 MG PO TABS
650.0000 mg | ORAL_TABLET | Freq: Four times a day (QID) | ORAL | Status: DC | PRN
  Administered 2023-08-08: 650 mg via ORAL
  Filled 2023-08-08: qty 2

## 2023-08-08 MED ORDER — POTASSIUM CHLORIDE CRYS ER 20 MEQ PO TBCR
60.0000 meq | EXTENDED_RELEASE_TABLET | Freq: Once | ORAL | Status: AC
  Administered 2023-08-08: 60 meq via ORAL
  Filled 2023-08-08: qty 3

## 2023-08-08 MED ORDER — CARVEDILOL 3.125 MG PO TABS
6.2500 mg | ORAL_TABLET | ORAL | Status: AC
  Administered 2023-08-08: 6.25 mg via ORAL
  Filled 2023-08-08: qty 2

## 2023-08-08 MED ORDER — ALBUTEROL SULFATE HFA 108 (90 BASE) MCG/ACT IN AERS: 2.0000 | INHALATION_SPRAY | Freq: Four times a day (QID) | RESPIRATORY_TRACT | 0 refills | Status: AC | PRN

## 2023-08-08 MED ORDER — ACETAMINOPHEN 325 MG PO TABS: 650.0000 mg | ORAL_TABLET | ORAL | Status: DC

## 2023-08-08 MED ORDER — IOHEXOL 350 MG/ML SOLN
75.0000 mL | Freq: Once | INTRAVENOUS | Status: AC | PRN
  Administered 2023-08-08: 75 mL via INTRAVENOUS

## 2023-08-08 MED ORDER — LOSARTAN POTASSIUM 50 MG PO TABS
100.0000 mg | ORAL_TABLET | ORAL | Status: AC
  Administered 2023-08-08: 100 mg via ORAL
  Filled 2023-08-08: qty 2

## 2023-08-08 NOTE — ED Triage Notes (Signed)
Pt arrives EMS with [facility] for the last few days. Pt reports positive COVID test on Friday. Pt has hx of CHF and per EMS pt was 88% on RA and arrives on 3L [place]. Pt does not use oxygen at baseline. Pt reports heaviness to chest.

## 2023-08-08 NOTE — ED Notes (Signed)
Pt's sat maintained at 97-99% while ambulating w steady gait.

## 2023-08-08 NOTE — Discharge Instructions (Signed)
It was a pleasure taking part in your care today.  As we discussed, the x-ray imaging of your chest did not show any evidence of pneumonia.  The CT scan we did of your chest confirmed this.  You maintained oxygen saturation at 97% when we walked you.  At this time, we are discharging you home.  I would like for you to follow-up with your PCP tomorrow, you state you have an appointment.  I am sending you home with an albuterol inhaler, please use this for shortness of breath as needed.  Please continue taking all prescribed medications as prescribed.  Please return to the ED with any new or worsening signs or symptoms.

## 2023-08-08 NOTE — ED Provider Notes (Signed)
[place] EMERGENCY DEPARTMENT AT [facility] Provider Note   CSN: [id number] Arrival date & time: 08/08/23  4782     History  Chief Complaint  Patient presents with   Shortness of Breath    Megan Dixon is a 57 y.o. female with medical history of palpitations, OSA, OCD, MS, morbid obesity, lupus, hypertension, fibromyalgia.  Patient presents to ED for evaluation of shortness of breath.  Patient states that beginning last week, 5 days ago, she developed a headache, fevers, bodyaches and chills, cough.  Patient reports she went to her PCPs office and was diagnosed with pneumonia as well as COVID.  Patient reports that ever since then she has had progressive worsening shortness of breath.  She states that she has also been experiencing lightheadedness, dizziness, weakness.  She states that beginning last night she developed a chest pressure located centrally that did not radiate.  She states it is not a "pain" but she continues to refer to as a heaviness.  She states that she has been sleeping propped up for the last few nights, endorsing orthopnea.  She is unsure if she has had fevers but she is endorsing nausea without vomiting.  She is also endorsing diarrhea.  She arrives on oxygen but denies using oxygen at home.  Per EMS, patient was found to be with oxygen saturation 88% on room air upon EMS arrival.   Shortness of Breath      Home Medications Prior to Admission medications   Medication Sig Start Date End Date Taking? Authorizing Provider  albuterol (VENTOLIN HFA) 108 (90 Base) MCG/ACT inhaler Inhale 2 puffs into the lungs every 6 (six) hours as needed for wheezing or shortness of breath. 08/08/23  Yes Al Decant, PA-C  atorvastatin (LIPITOR) 40 MG tablet Take 1 tablet (40 mg total) by mouth daily. 07/12/23   [name], DO  benzonatate (TESSALON) 100 MG capsule Take 1 capsule (100 mg total) by mouth 2 (two) times daily as needed for cough. 08/06/23    Tiffany Kocher, DO  carvedilol (COREG) 6.25 MG tablet Take 1 tablet (6.25 mg total) by mouth 2 (two) times daily with a meal. 11/23/22   Alicia Amel, MD  clopidogrel (PLAVIX) 75 MG tablet Take 1 tablet (75 mg total) by mouth daily. 01/30/23   [name], DO  cyclobenzaprine (FLEXERIL) 10 MG tablet TAKE 1 TABLET BY MOUTH THREE TIMES A DAY AS NEEDED FOR MUSCLE SPASM 01/20/23   [name], DO  Elastic Bandages & Supports (MEDICAL COMPRESSION STOCKINGS) MISC 1 Container by Does not apply route daily. Apply to both legs daily 01/01/20   Celedonio Savage, MD  FLUoxetine (PROZAC) 20 MG capsule TAKE 3 CAPSULES BY MOUTH EVERY DAY 06/08/23   [name], DO  gabapentin (NEURONTIN) 300 MG capsule Take 1 capsule (300 mg total) by mouth 3 (three) times daily as needed. 05/11/23   [name], DO  losartan (COZAAR) 100 MG tablet TAKE 1 TABLET BY MOUTH EVERY DAY 08/03/23   [name], DO  ondansetron (ZOFRAN-ODT) 4 MG disintegrating tablet Take 1 tablet (4 mg total) by mouth every 8 (eight) hours as needed for nausea or vomiting. 07/12/23   [name], DO  pantoprazole (PROTONIX) 40 MG tablet TAKE 1 TABLET BY MOUTH EVERY DAY 10/05/22   [name], DO      Allergies    Morphine and codeine, Other, Peanut-containing drug products, and Toradol [ketorolac tromethamine]    Review of Systems   Review of Systems  Respiratory:  Positive for shortness of breath.   All other systems reviewed and are negative.   Physical Exam Updated Vital Signs BP (!) 179/87   Pulse 61   Temp 97.7 F (36.5 C) (Oral)   Resp 19   SpO2 100%  Physical Exam Vitals and nursing note reviewed.  Constitutional:      General: She is not in acute distress.    Appearance: Normal appearance. She is not ill-appearing, toxic-appearing or diaphoretic.  HENT:     Head: Normocephalic and atraumatic.     Mouth/Throat:     Mouth: Mucous membranes are moist.     Pharynx: Oropharynx is clear.  Eyes:     Extraocular  Movements: Extraocular movements intact.     Conjunctiva/sclera: Conjunctivae normal.     Pupils: Pupils are equal, round, and reactive to light.  Cardiovascular:     Rate and Rhythm: Normal rate and regular rhythm.  Pulmonary:     Effort: Pulmonary effort is normal.     Breath sounds: Normal breath sounds. No wheezing.     Comments: Lung sounds clear to auscultation bilaterally Abdominal:     General: Abdomen is flat. Bowel sounds are normal.     Palpations: Abdomen is soft.     Tenderness: There is no abdominal tenderness.  Musculoskeletal:     Cervical back: Normal range of motion and neck supple. No tenderness.     Right lower leg: No edema.     Left lower leg: No edema.     Comments: No lower extremity peripheral edema  Skin:    General: Skin is warm and dry.     Capillary Refill: Capillary refill takes less than 2 seconds.  Neurological:     General: No focal deficit present.     Mental Status: She is alert and oriented to person, place, and time.     GCS: GCS eye subscore is 4. GCS verbal subscore is 5. GCS motor subscore is 6.     Cranial Nerves: Cranial nerves 2-12 are intact. No cranial nerve deficit.     Sensory: Sensation is intact. No sensory deficit.     Motor: Motor function is intact. No weakness.     Coordination: Coordination is intact. Heel to Shin Test normal.     ED Results / Procedures / Treatments   Labs (all labs ordered are listed, but only abnormal results are displayed) Labs Reviewed  BASIC METABOLIC PANEL - Abnormal; Notable for the following components:      Result Value   Potassium 3.2 (*)    Glucose, Bld 102 (*)    Calcium 8.5 (*)    All other components within normal limits  BRAIN NATRIURETIC PEPTIDE - Abnormal; Notable for the following components:   B Natriuretic Peptide 120.2 (*)    All other components within normal limits  URINALYSIS, ROUTINE W REFLEX MICROSCOPIC - Abnormal; Notable for the following components:   Color, Urine STRAW  (*)    All other components within normal limits  CBC  TROPONIN I (HIGH SENSITIVITY)  TROPONIN I (HIGH SENSITIVITY)    EKG EKG Interpretation Date/Time:  Sunday August 08 2023 06:41:02 EDT Ventricular Rate:  72 PR Interval:  158 QRS Duration:  95 QT Interval:  448 QTC Calculation: 491 R Axis:   42  Text Interpretation: Sinus rhythm Ventricular premature complex Borderline low voltage, extremity leads Borderline prolonged QT interval Confirmed by Zadie Rhine ([id number]) on 08/08/2023 6:50:44 AM  Radiology CT Angio Chest PE W and/or  Wo Contrast  Result Date: 08/08/2023 CLINICAL DATA:  Shortness of breath. COVID infection. High probability for pulmonary embolism. EXAM: CT ANGIOGRAPHY CHEST WITH CONTRAST TECHNIQUE: Multidetector CT imaging of the chest was performed using the standard protocol during bolus administration of intravenous contrast. Multiplanar CT image reconstructions and MIPs were obtained to evaluate the vascular anatomy. RADIATION DOSE REDUCTION: This exam was performed according to the departmental dose-optimization program which includes automated exposure control, adjustment of the mA and/or kV according to patient size and/or use of iterative reconstruction technique. CONTRAST:  75mL OMNIPAQUE IOHEXOL 350 MG/ML SOLN COMPARISON:  08/03/2018 FINDINGS: Cardiovascular: Satisfactory opacification of pulmonary arteries noted, and no pulmonary emboli identified. No evidence of thoracic aortic dissection or aneurysm. Mediastinum/Nodes: No masses or pathologically enlarged lymph nodes identified. Lungs/Pleura: No pulmonary mass, infiltrate, or effusion. Upper abdomen: No acute findings. Musculoskeletal: No suspicious bone lesions identified. Review of the MIP images confirms the above findings. IMPRESSION: Negative. No evidence of pulmonary embolism or other significant abnormality. Electronically Signed   By: Danae Orleans M.D.   On: 08/08/2023 12:48   DG Chest Portable 1  View  Result Date: 08/08/2023 CLINICAL DATA:  57 year old female with history of shortness of breath for the past few days. EXAM: PORTABLE CHEST 1 VIEW COMPARISON:  Chest x-ray 08/06/2023. FINDINGS: Diffuse increased interstitial markings and widespread peribronchial cuffing. No consolidative airspace disease. No pleural effusions. No suspicious appearing pulmonary nodules or masses are noted. Heart size is upper limits of normal. Upper mediastinal contours are within normal limits. Atherosclerotic calcifications are noted in the thoracic aorta. IMPRESSION: 1. The appearance the chest is concerning for acute bronchitis. Electronically Signed   By: Trudie Reed M.D.   On: 08/08/2023 08:02    Procedures Procedures   Medications Ordered in ED Medications  acetaminophen (TYLENOL) tablet 650 mg (650 mg Oral Not Given 08/08/23 0932)  losartan (COZAAR) tablet 100 mg (100 mg Oral Given 08/08/23 0732)  carvedilol (COREG) tablet 6.25 mg (6.25 mg Oral Given 08/08/23 0732)  potassium chloride SA (KLOR-CON M) CR tablet 60 mEq (60 mEq Oral Given 08/08/23 0921)  iohexol (OMNIPAQUE) 350 MG/ML injection 75 mL (75 mLs Intravenous Contrast Given 08/08/23 1121)    ED Course/ Medical Decision Making/ A&P  Medical Decision Making Amount and/or Complexity of Data Reviewed Labs: ordered. Radiology: ordered.  Risk OTC drugs. Prescription drug management.   57 year old female presents to ED for evaluation.  Please see HPI for further details.  On examination patient is afebrile and nontachycardic.  Her lung sounds are clear bilaterally and she is not hypoxic on room air.  Her abdomen is soft and compressible throughout.  Neurological examination is at baseline.  No edema to bilateral lower extremities.  She appears euvolemic.  Appears nontoxic.  Of note, patient blood pressure is elevated on arrival.  She reports that she has not taken her blood pressure medication this morning.  These were provided to  her.  CBC shows no leukocytosis, no anemia.  Metabolic panel shows potassium 3.2 repleted with 60 mEq oral potassium, no anion gap elevation, no creatinine elevation.  Urinalysis negative for all.  Patient troponin 10, 10 respectively with delta.  The patient BNP is elevated to 120 however she appears euvolemic.  Chest x-ray unremarkable, no consolidations or effusions however does show acute bronchitis.  Patient CTA collected which does not show any evidence of PE or other pathology.  Patient ambulated with nursing staff and was shown to maintain oxygen saturation between 97 and 100%.  At  this time patient we discharged home.  She will follow-up with her PCP tomorrow.  She reports that she has an appointment.  She will be sent home with albuterol inhaler.  She had all her questions answered to her satisfaction.  She is stable to discharge at this time.   Final Clinical Impression(s) / ED Diagnoses Final diagnoses:  SOB (shortness of breath)    Rx / DC Orders ED Discharge Orders          Ordered    albuterol (VENTOLIN HFA) 108 (90 Base) MCG/ACT inhaler  Every 6 hours PRN        08/08/23 1403              Al Decant, PA-C 08/08/23 1404    Jacalyn Lefevre, MD 08/08/23 1542

## 2023-08-09 ENCOUNTER — Ambulatory Visit: Payer: 59

## 2023-08-10 ENCOUNTER — Emergency Department (HOSPITAL_COMMUNITY)
Admission: EM | Admit: 2023-08-10 | Discharge: 2023-08-10 | Disposition: A | Discharge status: Home / Self Care | Payer: 59 | Attending: Emergency Medicine | Admitting: Emergency Medicine

## 2023-08-10 ENCOUNTER — Encounter: Payer: Self-pay | Admitting: Student

## 2023-08-10 ENCOUNTER — Other Ambulatory Visit: Payer: Self-pay

## 2023-08-10 ENCOUNTER — Ambulatory Visit (INDEPENDENT_AMBULATORY_CARE_PROVIDER_SITE_OTHER): Payer: 59 | Admitting: Student

## 2023-08-10 VITALS — BP 146/81 | HR 86 | Ht 61.0 in | Wt 262.4 lb

## 2023-08-10 DIAGNOSIS — I10 Essential (primary) hypertension: Secondary | ICD-10-CM

## 2023-08-10 DIAGNOSIS — Z91010 Allergy to peanuts: Secondary | ICD-10-CM | POA: Diagnosis not present

## 2023-08-10 DIAGNOSIS — L0291 Cutaneous abscess, unspecified: Secondary | ICD-10-CM

## 2023-08-10 DIAGNOSIS — R600 Localized edema: Secondary | ICD-10-CM | POA: Diagnosis not present

## 2023-08-10 DIAGNOSIS — Z7901 Long term (current) use of anticoagulants: Secondary | ICD-10-CM | POA: Insufficient documentation

## 2023-08-10 DIAGNOSIS — K047 Periapical abscess without sinus: Secondary | ICD-10-CM | POA: Insufficient documentation

## 2023-08-10 DIAGNOSIS — K0889 Other specified disorders of teeth and supporting structures: Secondary | ICD-10-CM | POA: Diagnosis present

## 2023-08-10 MED ORDER — AMOXICILLIN-POT CLAVULANATE 875-125 MG PO TABS
1.0000 | ORAL_TABLET | Freq: Two times a day (BID) | ORAL | 0 refills | Status: DC
Start: 2023-08-10 — End: 2023-10-14

## 2023-08-10 MED ORDER — FENTANYL CITRATE PF 50 MCG/ML IJ SOSY
25.0000 ug | PREFILLED_SYRINGE | Freq: Once | INTRAMUSCULAR | Status: AC
  Administered 2023-08-10: 25 ug via INTRAVENOUS
  Filled 2023-08-10: qty 1

## 2023-08-10 MED ORDER — MORPHINE SULFATE (PF) 4 MG/ML IV SOLN
4.0000 mg | Freq: Once | INTRAVENOUS | Status: DC
  Filled 2023-08-10: qty 1

## 2023-08-10 MED ORDER — HYDROCODONE-ACETAMINOPHEN 5-325 MG PO TABS: 2.0000 | ORAL_TABLET | ORAL | 0 refills | Status: DC | PRN

## 2023-08-10 MED ORDER — LACTATED RINGERS IV BOLUS
1000.0000 mL | Freq: Once | INTRAVENOUS | Status: AC
  Administered 2023-08-10: 1000 mL via INTRAVENOUS

## 2023-08-10 MED ORDER — AMOXICILLIN-POT CLAVULANATE 875-125 MG PO TABS
1.0000 | ORAL_TABLET | Freq: Two times a day (BID) | ORAL | 0 refills | Status: DC
Start: 2023-08-10 — End: 2023-08-10

## 2023-08-10 MED ORDER — ONDANSETRON HCL 4 MG/2ML IJ SOLN
4.0000 mg | Freq: Once | INTRAMUSCULAR | Status: AC
  Administered 2023-08-10: 4 mg via INTRAVENOUS
  Filled 2023-08-10: qty 2

## 2023-08-10 NOTE — Patient Instructions (Addendum)
It was great to see you today! Thank you for choosing [facility] for your primary care.  Today we addressed: Antibiotic for tooth abscess.  I would like you to go to the emergency room so they can get a CT scan of your face to make sure there is not a more deeply related infection that would require IV antibiotics and admission to the hospital. Your leg swelling has improved off amlodipine Follow up in 1 month for blood pressure since you stopped amlodipine and it is elevated both at home and here.  If you haven't already, sign up for My Chart to have easy access to your labs results, and communication with your primary care physician.  Return in about 1 month (around 09/10/2023) for Hypertension follow-up. Please arrive 15 minutes before your appointment to ensure smooth check in process.  We appreciate your efforts in making this happen.  Thank you for allowing me to participate in your care, Megan Mattocks, DO 08/10/2023, 1:52 PM PGY-3, [facility]

## 2023-08-10 NOTE — ED Triage Notes (Signed)
Pt with R sided dental pain and swelling since Sunday. Dentist appointment next week and is taking OTC meds including tylenol, ibuprofen, and Orajel without relief. Covid + on 8/22.

## 2023-08-10 NOTE — ED Provider Notes (Signed)
[place] EMERGENCY DEPARTMENT AT [facility] Provider Note   CSN: [id number] Arrival date & time: 08/10/23  1422     History  Chief Complaint  Patient presents with   Dental Pain    Teneya Patty is a 57 y.o. female.  57 year old female presents today for evaluation of right-sided dental pain and facial swelling.  She was seen at family practice earlier today and was referred to the emergency department to have a CT scan performed to rule out concerning finding.  Antibiotic was sent into her pharmacy.  She does have a dental appointment scheduled for upcoming Tuesday.  No fever, difficulty with p.o. intake, or voice change.  The history is provided by the patient. No language interpreter was used.       Home Medications Prior to Admission medications   Medication Sig Start Date End Date Taking? Authorizing Provider  albuterol (VENTOLIN HFA) 108 (90 Base) MCG/ACT inhaler Inhale 2 puffs into the lungs every 6 (six) hours as needed for wheezing or shortness of breath. 08/08/23   Al Decant, PA-C  amoxicillin-clavulanate (AUGMENTIN) 875-125 MG tablet Take 1 tablet by mouth 2 (two) times daily. 08/10/23   [name], DO  atorvastatin (LIPITOR) 40 MG tablet Take 1 tablet (40 mg total) by mouth daily. 07/12/23   [name], DO  benzonatate (TESSALON) 100 MG capsule Take 1 capsule (100 mg total) by mouth 2 (two) times daily as needed for cough. 08/06/23   Tiffany Kocher, DO  carvedilol (COREG) 6.25 MG tablet Take 1 tablet (6.25 mg total) by mouth 2 (two) times daily with a meal. 11/23/22   Alicia Amel, MD  clopidogrel (PLAVIX) 75 MG tablet Take 1 tablet (75 mg total) by mouth daily. 01/30/23   [name], DO  cyclobenzaprine (FLEXERIL) 10 MG tablet TAKE 1 TABLET BY MOUTH THREE TIMES A DAY AS NEEDED FOR MUSCLE SPASM 01/20/23   [name], DO  Elastic Bandages & Supports (MEDICAL COMPRESSION STOCKINGS) MISC 1 Container by Does not apply route daily.  Apply to both legs daily 01/01/20   Celedonio Savage, MD  FLUoxetine (PROZAC) 20 MG capsule TAKE 3 CAPSULES BY MOUTH EVERY DAY 06/08/23   [name], DO  gabapentin (NEURONTIN) 300 MG capsule Take 1 capsule (300 mg total) by mouth 3 (three) times daily as needed. 05/11/23   [name], DO  losartan (COZAAR) 100 MG tablet TAKE 1 TABLET BY MOUTH EVERY DAY 08/03/23   [name], DO  ondansetron (ZOFRAN-ODT) 4 MG disintegrating tablet Take 1 tablet (4 mg total) by mouth every 8 (eight) hours as needed for nausea or vomiting. 07/12/23   [name], DO  pantoprazole (PROTONIX) 40 MG tablet TAKE 1 TABLET BY MOUTH EVERY DAY 10/05/22   [name], DO      Allergies    Morphine and codeine, Other, Peanut-containing drug products, and Toradol [ketorolac tromethamine]    Review of Systems   Review of Systems  Constitutional:  Negative for chills and fever.  HENT:  Positive for dental problem. Negative for drooling, sore throat, trouble swallowing and voice change.   Respiratory:  Negative for cough.   All other systems reviewed and are negative.   Physical Exam Updated Vital Signs BP (!) 162/77 (BP Location: Left Arm)   Pulse 84   Temp 99 F (37.2 C) (Oral)   Resp 19   SpO2 98%  Physical Exam Vitals and nursing note reviewed.  Constitutional:      General: She is not  in acute distress.    Appearance: Normal appearance. She is not ill-appearing.  HENT:     Head: Normocephalic and atraumatic.     Comments: Right-sided facial swelling noted.  No drainage, no evidence of trismus, retropharyngeal abscess, peritonsillar abscess.  No obvious drainable dental abscess noted.    Nose: Nose normal.  Eyes:     Conjunctiva/sclera: Conjunctivae normal.  Pulmonary:     Effort: Pulmonary effort is normal. No respiratory distress.  Musculoskeletal:        General: No deformity.  Skin:    Findings: No rash.  Neurological:     Mental Status: She is alert.     ED Results / Procedures  / Treatments   Labs (all labs ordered are listed, but only abnormal results are displayed) Labs Reviewed  CBC WITH DIFFERENTIAL/PLATELET  BASIC METABOLIC PANEL  I-STAT CHEM 8, ED    EKG None  Radiology No results found.  Procedures Procedures    Medications Ordered in ED Medications  ondansetron (ZOFRAN) injection 4 mg (has no administration in time range)  lactated ringers bolus 1,000 mL (has no administration in time range)  fentaNYL (SUBLIMAZE) injection 25 mcg (has no administration in time range)    ED Course/ Medical Decision Making/ A&P                                 Medical Decision Making Amount and/or Complexity of Data Reviewed Labs: ordered. Radiology: ordered.  Risk Prescription drug management.   57 year old female presents with above-mentioned plaints.  Overall she is well-appearing.  Normal speech.  No trismus.  Right-sided facial swelling noted.  Has a dentist appointment for Tuesday.  She was given antibiotics but was sent for CT to rule out concerning finding.  However after 7 hours in the emergency department patient became frustrated and requested to leave.  She would prefer symptom management with pain medication, and antibiotic with strict return precautions instead of waiting in the emergency department for the CT scan.  She is without trismus, or evidence of RPA, PTA, or obvious drainable dental abscess.  Strict return precautions discussed.  Patient discharged in appropriate condition.  Short course of pain medication and Augmentin sent into patient's choice of pharmacy.   Final Clinical Impression(s) / ED Diagnoses Final diagnoses:  Abscess    Rx / DC Orders ED Discharge Orders          Ordered    HYDROcodone-acetaminophen (NORCO/VICODIN) 5-325 MG tablet  Every 4 hours PRN        08/10/23 2206    amoxicillin-clavulanate (AUGMENTIN) 875-125 MG tablet  2 times daily        08/10/23 2206              Marita Kansas, PA-C 08/10/23  2211    Laurence Spates, MD 08/11/23 0021

## 2023-08-10 NOTE — Assessment & Plan Note (Addendum)
I am greatly concerned by the appearance and swelling of local tissue.  Cannot rule out further abscess that may require admission with IV antibiotics.  Advised patient to proceed to ED for CT maxillofacial with contrast.  I have sent in Rx for Augmentin should the CT not reveal complicated abscess.  She has proceeded to the ED. if not requiring admission, recommend follow-up in clinic in 2 days.  Fortunately, she does already have dentistry on board next week.

## 2023-08-10 NOTE — Discharge Instructions (Addendum)
Pain medication and antibiotics sent into your choice of pharmacy.  If any concerning symptoms return to the emergency room.  We discussed waiting for CT scan however after significant time in the emergency department he requested for discharge and you will return for any concerning symptoms.  Follow-up with your dentist.  If you are unable to have attached information for our on-call dentist.

## 2023-08-10 NOTE — Assessment & Plan Note (Signed)
Resolved

## 2023-08-10 NOTE — Assessment & Plan Note (Signed)
Not as well-controlled, currently on just losartan 100 mg daily, Coreg 6.25 mg twice daily.  She did stop amlodipine which may be involved but additionally is experiencing significant pain with tooth abscess.  Reevaluate at future visit.

## 2023-08-10 NOTE — Progress Notes (Signed)
  SUBJECTIVE:   CHIEF COMPLAINT / HPI:   Lower extremity edema follow-up: Presented last on 07/12/2023.  Recent echo LVEF 60 to 65% with G2 DD which is actually improved from her 2021 echo.  She discontinued amlodipine at the last visit. She has also been wearing the compression stockings.   Painful tooth: She has been washing her mouth with saltwater, listerine, and taking tylenol and ibuprofen. She has a dentist appointment on 08/17/23.  Unfortunately she is still getting over COVID and while that is getting better, this painful tooth started on 08/07/2023.  She is doing well with drinking fluids but cannot chew well.  The swelling that she is experiencing started today and she states it looks like she got hit in the face.  PERTINENT  PMH / PSH: Fibromyalgia, HTN, HLD, chronic diastolic heart failure, depression, CVA, OSA, GERD  Patient Care Team: [name], DO as PCP - General (Family Medicine) Jodelle Red, MD as PCP - Cardiology (Cardiology) Jodelle Red, MD as Consulting Physician (Cardiology) OBJECTIVE:  BP (!) 146/81   Pulse 86   Ht 5\' 1"  (1.549 m)   Wt 262 lb 6.4 oz (119 kg)   SpO2 100%   BMI 49.58 kg/m  General: NAD HEENT: No appreciable lymphadenopathy, abscess of right upper molar and very poor dentition, significant swelling of right buccal tissue with tenderness CV: RRR, no murmurs auscultated Pulm: CTAB, normal WOB Extremities: No appreciable edema BLEs    ASSESSMENT/PLAN:  Abscess Assessment & Plan: I am greatly concerned by the appearance and swelling of local tissue.  Cannot rule out further abscess that may require admission with IV antibiotics.  Advised patient to proceed to ED for CT maxillofacial with contrast.  I have sent in Rx for Augmentin should the CT not reveal complicated abscess.  She has proceeded to the ED. if not requiring admission, recommend follow-up in clinic in 2 days.  Fortunately, she does already have dentistry on board  next week.   Orders: -     Amoxicillin-Pot Clavulanate; Take 1 tablet by mouth 2 (two) times daily.  Dispense: 20 tablet; Refill: 0 -     CT MAXILLOFACIAL W CONTRAST  Primary hypertension Assessment & Plan: Not as well-controlled, currently on just losartan 100 mg daily, Coreg 6.25 mg twice daily.  She did stop amlodipine which may be involved but additionally is experiencing significant pain with tooth abscess.  Reevaluate at future visit.   Lower extremity edema Assessment & Plan: Resolved   Return in about 1 month (around 09/10/2023) for Hypertension follow-up. [name], DO 08/10/2023, 2:58 PM PGY-3, [facility]

## 2023-08-20 ENCOUNTER — Ambulatory Visit (HOSPITAL_COMMUNITY)
Admission: RE | Admit: 2023-08-20 | Discharge: 2023-08-20 | Disposition: A | Discharge status: Home / Self Care | Payer: 59 | Source: Ambulatory Visit | Attending: Family Medicine

## 2023-08-20 DIAGNOSIS — L0291 Cutaneous abscess, unspecified: Secondary | ICD-10-CM | POA: Diagnosis not present

## 2023-08-20 DIAGNOSIS — R590 Localized enlarged lymph nodes: Secondary | ICD-10-CM | POA: Diagnosis not present

## 2023-08-20 MED ORDER — IOHEXOL 300 MG/ML  SOLN
75.0000 mL | Freq: Once | INTRAMUSCULAR | Status: AC | PRN
  Administered 2023-08-20: 75 mL via INTRAVENOUS

## 2023-08-23 ENCOUNTER — Encounter: Payer: Self-pay | Admitting: Student

## 2023-08-24 ENCOUNTER — Other Ambulatory Visit: Payer: Self-pay | Admitting: Student

## 2023-08-24 DIAGNOSIS — M797 Fibromyalgia: Secondary | ICD-10-CM

## 2023-09-13 ENCOUNTER — Telehealth: Payer: Self-pay

## 2023-09-13 NOTE — Telephone Encounter (Signed)
Patient calls nurse line regarding accidentally taking extra dose of fluoxetine.   She took her normal dose, 60 mg fluoxetine this AM around 0700.   She states that she then accidentally took another 60 mg fluoxetine at around 1600.   She is not having any symptoms at this time. Spoke with preceptor, Dr. Linwood Dibbles regarding patient.   She advised that patient continue with regular dosing starting tomorrow and red flags to monitor for.   Advised patient of recommendations per Dr. Linwood Dibbles.   Patient will call back with any further concerns.   Veronda Prude, RN

## 2023-09-15 ENCOUNTER — Other Ambulatory Visit: Payer: Self-pay | Admitting: Student

## 2023-09-15 DIAGNOSIS — F32A Depression, unspecified: Secondary | ICD-10-CM

## 2023-10-09 ENCOUNTER — Other Ambulatory Visit: Payer: Self-pay | Admitting: Student

## 2023-10-09 DIAGNOSIS — I509 Heart failure, unspecified: Secondary | ICD-10-CM

## 2023-10-12 ENCOUNTER — Other Ambulatory Visit: Payer: Self-pay | Admitting: Student

## 2023-10-12 DIAGNOSIS — F32A Depression, unspecified: Secondary | ICD-10-CM

## 2023-10-14 ENCOUNTER — Other Ambulatory Visit: Payer: Self-pay | Admitting: Student

## 2023-10-14 ENCOUNTER — Ambulatory Visit: Payer: 59 | Admitting: Student

## 2023-10-14 ENCOUNTER — Encounter: Payer: Self-pay | Admitting: Student

## 2023-10-14 VITALS — BP 193/99 | HR 86 | Ht 61.0 in | Wt 267.8 lb

## 2023-10-14 DIAGNOSIS — I10 Essential (primary) hypertension: Secondary | ICD-10-CM

## 2023-10-14 DIAGNOSIS — G479 Sleep disorder, unspecified: Secondary | ICD-10-CM

## 2023-10-14 DIAGNOSIS — M79642 Pain in left hand: Secondary | ICD-10-CM | POA: Diagnosis not present

## 2023-10-14 DIAGNOSIS — M1812 Unilateral primary osteoarthritis of first carpometacarpal joint, left hand: Secondary | ICD-10-CM | POA: Insufficient documentation

## 2023-10-14 MED ORDER — LOSARTAN POTASSIUM-HCTZ 100-25 MG PO TABS: 1.0000 | ORAL_TABLET | Freq: Every day | ORAL | 0 refills | Status: DC

## 2023-10-14 NOTE — Patient Instructions (Addendum)
It was great to see you today! Thank you for choosing [facility] for your primary care.  Today we addressed: Left hand pain and swelling: we are going to get an Xray of your left hand. We also provided a brace for you to wear. Keep taking Tylenol for pain. We can also discuss a referral to sports medicine for an injection if pain persists Sleep Troubles: try to get to bed around 10:30pm if possible, and know it might take a little bit for your body to get used to going to bed at this time. Your increased anxiety is likely also making your sleep worse, and so we recommend going to a therapist. For information on therapists, please go to [email or web address]. You can also contact your insurance company to find an in-network therapist.  High blood pressure: your blood pressure was very high today, and so we are starting you on a new medication called hydrochlorothiazide at 25 mg daily; this will be in a combo pill with losartan. Keep taking your other blood pressure medication, carvedilol! If you start experiencing chest pain, severe headache, trouble seeing or blurry vision, or trouble breathing, these are signs that your blood pressure might be dangerously high, so please go to the Emergency Room!  If you haven't already, sign up for My Chart to have easy access to your labs results, and communication with your primary care physician.  Make a lab appointment in 1 week. Return in about 2 weeks (around 10/28/2023) for HTN f/u. Please arrive 15 minutes before your appointment to ensure smooth check in process.  We appreciate your efforts in making this happen.  Thank you for allowing me to participate in your care, Dr. Jeannie Done, Medical Student 10/14/2023, 2:29 PM PGY-3, [facility]

## 2023-10-14 NOTE — Progress Notes (Signed)
SUBJECTIVE:   CHIEF COMPLAINT / HPI:   Megan Dixon is a 57 y.o. female who presents today for L hand pain and swelling, trouble sleeping.  L Hand Pain and Swelling Started 4 months ago in L hand only; both pain around the base of her thumb and swelling. Pain radiates up to elbow. Has been getting worse. She does have a history of arthritis. Now will wake her up in middle of night with "throbbing" sensation. Has used Salonpas patch around thumb base. Grip strength reduced. Reports tingling and numbness in area of base of thumb. Have been using ice and tylenol with some mild improvement. No brace use. Hx of carpal tunnel surgery on bilateral hands.   Trouble Sleeping Not a ton of trouble falling asleep and goes to bed after 12am, but then wakes up at 3-4am and can't fall asleep after. Takes naps around an hour or less a day. Unsure hx of sleep apnea, does not use a machine. Denies being told she snores/has apneic episodes. Has tried melatonin gummies (unknown dose) without improvement in sleep. Believes trouble sleeping related to increased anxiety. Reports feeling more anxiety lately and has been having more frequent panic attacks. Her sister recently moved in and has a lot of drug related difficulties and medical issues. This has been a change for [employer]. Takes fluoxetine 60 mg daily; she denies recent changes to this medication. Has been able to obtain and take without issue. Previously saw a therapist, but has not seen one recently. Would be willing to see a therapist.  Hypertension BP remains elevated on recheck today. Pt denies chest pain, SOB, headache, changes in vision. Reports compliance with BP medications and took them this morning.  PERTINENT  PMH / PSH: Fibromyalgia, HTN, HLD, chronic diastolic heart failure, anxiety, CVA, OSA, GERD   OBJECTIVE:  BP (!) 193/99   Pulse 86   Ht 5\' 1"  (1.549 m)   Wt 267 lb 12.8 oz (121.5 kg)   SpO2 97%   BMI 50.60 kg/m   General: Pt is  seated on chair, no acute distress. Cardiovascular: RRR, no murmurs, rubs, gallops. Pulmonary: Normal work of breathing. Lungs clear to auscultation bilaterally. MSK: L hand with appreciable swelling over CMC. Pain to palpation of thenar eminence and CMC joint of L hand. Trigger finger of L middle finger. Grip strength decreased in L hand compared to R hand. Equal sensation bilaterally. Negative Tinel's and Phalen's tests. Neuro/Psych: Alert and oriented to person, place, event, time. Normal affect.  ASSESSMENT/PLAN:   Assessment & Plan Left hand pain L hand pain of CMC joint and swelling with negative carpal tunnel workup; symptoms suspicious for osteoarthritis. No prior history of L hand imaging. - Will get L hand Xray to evaluate further - Brace provided to pt in clinic today - Pt can continuing using Tylenol and ice for symptom relief - Can consider referral to sports medicine at follow-up for consideration of injection Difficulty sleeping Pt reports difficulty sleeping for a long time with waking up early and being unable to fall back asleep. Reports increased anxiety recently; is compliant with Fluoxetine 60 mg daily. Suspect sleep troubles multifactorial in etiology with some poor sleep habits and increased anxiety worsening sleep. Pt has tried melatonin in past without improvement. Pt with unknown history of OSA - Encouraged good sleep habits with pt, including trying to get to bed earlier - Recommend pt establish with therapist to help with anxiety control - Can consider OSA workup/sleep study at follow-up  visit Primary hypertension BP today remains elevated at 193/99 at recheck. No red flag symptoms of elevated BP. Pt reports compliance with losartan 100 mg daily and carvedilol 6.25 mg BID. - START losartan-hydrochlorothiazide at 100-25 mg daily - Continue carvedilol 6.25 mg BID - Will check kidney function in a week with BMP - Will arrange for close follow-up in 2 weeks for BP  check   Governor Rooks, Medical Student [facility]  I was personally present and performed or re-performed the history, physical exam and medical decision making activities of this service and have verified that the service and findings are accurately documented in the student's note.  [name], DO                  10/15/2023, 12:56 PM

## 2023-10-14 NOTE — Assessment & Plan Note (Addendum)
Pt reports difficulty sleeping for a long time with waking up early and being unable to fall back asleep. Reports increased anxiety recently; is compliant with Fluoxetine 60 mg daily. Suspect sleep troubles multifactorial in etiology with some poor sleep habits and increased anxiety worsening sleep. Pt has tried melatonin in past without improvement. Pt with unknown history of OSA - Encouraged good sleep habits with pt, including trying to get to bed earlier - Recommend pt establish with therapist to help with anxiety control - Can consider OSA workup/sleep study at follow-up visit

## 2023-10-14 NOTE — Assessment & Plan Note (Addendum)
BP today remains elevated at 193/99 at recheck. No red flag symptoms of elevated BP. Pt reports compliance with losartan 100 mg daily and carvedilol 6.25 mg BID. - START losartan-hydrochlorothiazide at 100-25 mg daily - Continue carvedilol 6.25 mg BID - Will check kidney function in a week with BMP - Will arrange for close follow-up in 2 weeks for BP check

## 2023-10-14 NOTE — Assessment & Plan Note (Addendum)
L hand pain of CMC joint and swelling with negative carpal tunnel workup; symptoms suspicious for osteoarthritis. No prior history of L hand imaging. - Will get L hand Xray to evaluate further - Brace provided to pt in clinic today - Pt can continuing using Tylenol and ice for symptom relief - Can consider referral to sports medicine at follow-up for consideration of injection

## 2023-10-14 NOTE — Progress Notes (Cosign Needed Addendum)
  SUBJECTIVE:   CHIEF COMPLAINT / HPI:   Left hand swelling:  PERTINENT  PMH / PSH: Fibromyalgia, HTN, HLD, chronic diastolic heart failure, anxiety, CVA, OSA, GERD  OBJECTIVE:  There were no vitals taken for this visit. Physical Exam   ASSESSMENT/PLAN:   Assessment & Plan Left hand pain  Primary hypertension  No follow-ups on file. [name], DO 10/14/2023, 12:31 PM PGY-3, [facility]

## 2023-10-19 ENCOUNTER — Encounter: Payer: Self-pay | Admitting: Student

## 2023-10-21 ENCOUNTER — Other Ambulatory Visit: Payer: 59

## 2023-10-21 ENCOUNTER — Ambulatory Visit
Admission: RE | Admit: 2023-10-21 | Discharge: 2023-10-21 | Disposition: A | Discharge status: Home / Self Care | Payer: 59 | Source: Ambulatory Visit | Attending: Family Medicine | Admitting: Family Medicine

## 2023-10-21 DIAGNOSIS — I10 Essential (primary) hypertension: Secondary | ICD-10-CM

## 2023-10-21 DIAGNOSIS — M79642 Pain in left hand: Secondary | ICD-10-CM | POA: Diagnosis not present

## 2023-10-21 DIAGNOSIS — M19042 Primary osteoarthritis, left hand: Secondary | ICD-10-CM | POA: Diagnosis not present

## 2023-10-22 ENCOUNTER — Encounter: Payer: Self-pay | Admitting: Student

## 2023-10-22 ENCOUNTER — Telehealth: Payer: Self-pay | Admitting: Student

## 2023-10-22 LAB — BASIC METABOLIC PANEL
BUN/Creatinine Ratio: 22 (ref 9–23)
BUN: 16 mg/dL (ref 6–24)
CO2: 26 mmol/L (ref 20–29)
Calcium: 9.1 mg/dL (ref 8.7–10.2)
Chloride: 103 mmol/L (ref 96–106)
Creatinine, Ser: 0.74 mg/dL (ref 0.57–1.00)
Glucose: 91 mg/dL (ref 70–99)
Potassium: 4.1 mmol/L (ref 3.5–5.2)
Sodium: 142 mmol/L (ref 134–144)
eGFR: 93 mL/min/{1.73_m2} (ref >59)

## 2023-10-22 NOTE — Telephone Encounter (Signed)
Called patient to notify normal results.

## 2023-10-22 NOTE — Telephone Encounter (Signed)
Called patient and reviewed x-ray which shows arthritis.  She is not interested in injections and cannot take NSAIDs.  Advised that pain should improve with time.  May continue conservative measures.

## 2023-10-28 NOTE — Progress Notes (Signed)
  SUBJECTIVE:   CHIEF COMPLAINT / HPI:   Hypertension: Home medications include: Losartan-HCTZ 100-25 mg daily, carvedilol 6.25 mg twice daily. She endorses taking these medications as prescribed. Does check blood pressure at home.  States it runs in the 140s/80s however she uses a wrist cuff to check this at home. Patient has had a BMP in the past 1 year.  GERD: Requests Protonix refill.  Depression: She has had some difficult times with her family.  She is currently involved in a church counseling group and would like to consider increasing her Prozac dosage.  PHQ-9 with score of 15 with negative question 9.  She is the 10th of 10 kids and is currently helping raise her niece as her sister is having some drug difficulties.  PERTINENT  PMH / PSH: Fibromyalgia, HTN, HLD, chronic diastolic heart failure, anxiety, CVA, OSA, GERD  OBJECTIVE:  BP (!) 152/80   Pulse 85   Wt 262 lb 12.8 oz (119.2 kg)   SpO2 92%   BMI 49.66 kg/m  General: Well-appearing, NAD CV: RRR, no murmurs auscultated Psych: Normal mood and affect  ASSESSMENT/PLAN:   Assessment & Plan Primary hypertension BP: (!) 152/80 today. Poorly controlled. Goal of <130/80. Follow up in 1 month. Medication regimen: Losartan-HCTZ 100-25 mg daily, increase carvedilol to 12.5 mg twice daily.  Consider starting spironolactone at next visit if still poorly controlled. Depression, unspecified depression type Increase Prozac to 80 mg daily.  Suspect her recent worsening is related to family turmoil.  Continue counseling through the church. Gastroesophageal reflux disease, unspecified whether esophagitis present Refilled Protonix. Return in about 1 month (around 11/28/2023) for HTN and depression follow-up. [name], DO 10/29/2023, 9:43 AM PGY-3, [facility]

## 2023-10-29 ENCOUNTER — Ambulatory Visit (INDEPENDENT_AMBULATORY_CARE_PROVIDER_SITE_OTHER): Payer: 59 | Admitting: Student

## 2023-10-29 ENCOUNTER — Encounter: Payer: Self-pay | Admitting: Student

## 2023-10-29 VITALS — BP 152/80 | HR 85 | Wt 262.8 lb

## 2023-10-29 DIAGNOSIS — I509 Heart failure, unspecified: Secondary | ICD-10-CM

## 2023-10-29 DIAGNOSIS — K219 Gastro-esophageal reflux disease without esophagitis: Secondary | ICD-10-CM | POA: Diagnosis not present

## 2023-10-29 DIAGNOSIS — I10 Essential (primary) hypertension: Secondary | ICD-10-CM | POA: Diagnosis not present

## 2023-10-29 DIAGNOSIS — F32A Depression, unspecified: Secondary | ICD-10-CM | POA: Diagnosis not present

## 2023-10-29 MED ORDER — FLUOXETINE HCL 40 MG PO CAPS: 80.0000 mg | ORAL_CAPSULE | Freq: Every day | ORAL | 0 refills | Status: DC

## 2023-10-29 MED ORDER — PANTOPRAZOLE SODIUM 40 MG PO TBEC: 40.0000 mg | DELAYED_RELEASE_TABLET | Freq: Every day | ORAL | 3 refills | Status: DC

## 2023-10-29 MED ORDER — CARVEDILOL 12.5 MG PO TABS: 12.5000 mg | ORAL_TABLET | Freq: Two times a day (BID) | ORAL | 0 refills | Status: DC

## 2023-10-29 MED ORDER — LOSARTAN POTASSIUM-HCTZ 100-25 MG PO TABS: 1.0000 | ORAL_TABLET | Freq: Every day | ORAL | 3 refills | Status: DC

## 2023-10-29 NOTE — Patient Instructions (Addendum)
It was great to see you today! Thank you for choosing [facility] for your primary care.  Today we addressed: Depression: We are increasing her Prozac dosage to 80 mg daily.  This is the max dosage for this medication. Hypertension: Your blood pressure is moving in the correct direction although we are still not at our goal of <130/80.  I have increased your carvedilol to 12.5 mg twice daily. I have refilled your Protonix for 1 year.  If you haven't already, sign up for My Chart to have easy access to your labs results, and communication with your primary care physician.  Return in about 1 month (around 11/28/2023) for HTN and depression follow-up. Please arrive 15 minutes before your appointment to ensure smooth check in process.  We appreciate your efforts in making this happen.  Thank you for allowing me to participate in your care, [name], DO 10/29/2023, 9:42 AM PGY-3, [facility]

## 2023-10-29 NOTE — Assessment & Plan Note (Signed)
Increase Prozac to 80 mg daily.  Suspect her recent worsening is related to family turmoil.  Continue counseling through the church.

## 2023-10-29 NOTE — Assessment & Plan Note (Signed)
BP: (!) 152/80 today. Poorly controlled. Goal of <130/80. Follow up in 1 month. Medication regimen: Losartan-HCTZ 100-25 mg daily, increase carvedilol to 12.5 mg twice daily.  Consider starting spironolactone at next visit if still poorly controlled.

## 2023-10-29 NOTE — Assessment & Plan Note (Signed)
Refilled Protonix 

## 2023-11-30 ENCOUNTER — Ambulatory Visit: Payer: 59 | Admitting: Student

## 2023-12-02 ENCOUNTER — Other Ambulatory Visit: Payer: Self-pay | Admitting: Student

## 2023-12-02 DIAGNOSIS — M797 Fibromyalgia: Secondary | ICD-10-CM

## 2023-12-05 ENCOUNTER — Other Ambulatory Visit: Payer: Self-pay | Admitting: Student

## 2023-12-05 DIAGNOSIS — F32A Depression, unspecified: Secondary | ICD-10-CM

## 2023-12-05 DIAGNOSIS — I509 Heart failure, unspecified: Secondary | ICD-10-CM

## 2023-12-09 ENCOUNTER — Other Ambulatory Visit: Payer: Self-pay | Admitting: Student

## 2023-12-15 ENCOUNTER — Other Ambulatory Visit: Payer: Self-pay | Admitting: Student

## 2023-12-17 ENCOUNTER — Ambulatory Visit: Payer: 59 | Admitting: Student

## 2023-12-17 NOTE — Progress Notes (Deleted)
  SUBJECTIVE:   CHIEF COMPLAINT / HPI:   Hypertension: Home medications include: Losartan -HCTZ 100-25 mg daily, carvedilol  12.5 mg twice daily. She endorses taking these medications as prescribed. {Blank single:19197::Does,Does not} check blood pressure at home.*** Diet ***. Exercise ***. Patient has had a BMP in the past 1 year.  Depression: She continues to take Prozac  80 mg daily.***  PERTINENT  PMH / PSH: Fibromyalgia, HTN, HLD, chronic diastolic heart failure, anxiety, CVA, OSA, GERD   OBJECTIVE:  There were no vitals taken for this visit. Physical Exam   ASSESSMENT/PLAN:   Assessment & Plan  No follow-ups on file. Kieth Johnson, DO 12/17/2023, 12:59 PM PGY-3, [facility] {    This will disappear when note is signed, click to select method of visit    :1}

## 2023-12-24 ENCOUNTER — Telehealth: Payer: 59 | Admitting: Student

## 2023-12-24 DIAGNOSIS — M25561 Pain in right knee: Secondary | ICD-10-CM

## 2023-12-24 MED ORDER — OXYCODONE HCL 5 MG PO TABS: 5.0000 mg | ORAL_TABLET | Freq: Three times a day (TID) | ORAL | 0 refills | Status: AC | PRN

## 2023-12-24 NOTE — Progress Notes (Signed)
 [facility] Telemedicine Visit  Patient consented to have virtual visit and was identified by name and date of birth. Method of visit: Telephone  Encounter participants: Patient: Megan Dixon - located at home Provider: Kieth Johnson - located at home Others (if applicable): N/A  Chief Complaint: Right knee pain  HPI:  She fell and hurt her right knee 3 days ago. Denies head injury. She has been icing/heating it, using a brace, and taking ibuprofen  and tylenol . She states it is swollen and she has been elevating it as well. She has needed her cane to walk. She denies any numbness in her foot or color change. Her foot and leg are still in normal position.   ROS: per HPI  Pertinent PMHx: HTN, CVA, GERD, OSA, depression, HLD, fibromyalgia  Exam:  There were no vitals taken for this visit.  Respiratory: normal WOB  Assessment/Plan: Assessment & Plan Acute pain of right knee Advised obtaining XR. Patient amenable to being in [facility] urgent care however closed due to weather. Unfortunately she also does not have transportation until tomorrow morning. Provided address and timings of [facility]. Provided 5 tabs of oxycodone  for breakthrough pain over the weekend. Continue conservative management with tylenol  1000mg  q6h and ibuprofen  800mg  q8h. Discussed ED precautions for vascular or neurological involvement.  Time spent during visit with patient: 25 minutes

## 2023-12-24 NOTE — Patient Instructions (Addendum)
 It was great to see you today! Thank you for choosing [facility] for your primary care.  Today we addressed: Conservative management with tylenol  and ibuprofen . I strongly recommend obtaining XR as soon as possible. Rx for oxycodone  given inclement weather that is complicating workup.   If you haven't already, sign up for My Chart to have easy access to your labs results, and communication with your primary care physician.  Please arrive 15 minutes before your appointment to ensure smooth check in process.  We appreciate your efforts in making this happen.  Thank you for allowing me to participate in your care, Kieth Johnson, DO 12/24/2023, 3:29 PM PGY-3, [facility]

## 2023-12-29 ENCOUNTER — Encounter (HOSPITAL_COMMUNITY): Payer: Self-pay | Admitting: Emergency Medicine

## 2023-12-29 ENCOUNTER — Other Ambulatory Visit: Payer: Self-pay

## 2023-12-29 ENCOUNTER — Emergency Department (HOSPITAL_COMMUNITY)
Admission: EM | Admit: 2023-12-29 | Discharge: 2023-12-29 | Discharge status: Against Medical Advice | Payer: 59 | Attending: Emergency Medicine | Admitting: Emergency Medicine

## 2023-12-29 ENCOUNTER — Ambulatory Visit: Payer: 59 | Admitting: Student

## 2023-12-29 VITALS — BP 166/92 | HR 71 | Temp 98.1°F | Ht 61.0 in

## 2023-12-29 DIAGNOSIS — R1110 Vomiting, unspecified: Secondary | ICD-10-CM | POA: Insufficient documentation

## 2023-12-29 DIAGNOSIS — Z5321 Procedure and treatment not carried out due to patient leaving prior to being seen by health care provider: Secondary | ICD-10-CM | POA: Diagnosis not present

## 2023-12-29 DIAGNOSIS — R112 Nausea with vomiting, unspecified: Secondary | ICD-10-CM

## 2023-12-29 DIAGNOSIS — R1013 Epigastric pain: Secondary | ICD-10-CM | POA: Insufficient documentation

## 2023-12-29 DIAGNOSIS — R1012 Left upper quadrant pain: Secondary | ICD-10-CM | POA: Insufficient documentation

## 2023-12-29 DIAGNOSIS — R197 Diarrhea, unspecified: Secondary | ICD-10-CM | POA: Insufficient documentation

## 2023-12-29 LAB — COMPREHENSIVE METABOLIC PANEL
ALT: 17 U/L (ref 0–44)
AST: 17 U/L (ref 15–41)
Albumin: 3.6 g/dL (ref 3.5–5.0)
Alkaline Phosphatase: 82 U/L (ref 38–126)
Anion gap: 11 (ref 5–15)
BUN: 12 mg/dL (ref 6–20)
CO2: 27 mmol/L (ref 22–32)
Calcium: 9.3 mg/dL (ref 8.9–10.3)
Chloride: 103 mmol/L (ref 98–111)
Creatinine, Ser: 0.8 mg/dL (ref 0.44–1.00)
GFR, Estimated: >60 mL/min (ref >60)
Glucose, Bld: 106 mg/dL — ABNORMAL HIGH (ref 70–99)
Potassium: 3.7 mmol/L (ref 3.5–5.1)
Sodium: 141 mmol/L (ref 135–145)
Total Bilirubin: 0.7 mg/dL (ref 0.0–1.2)
Total Protein: 7.3 g/dL (ref 6.5–8.1)

## 2023-12-29 LAB — CBC
HCT: 47 % — ABNORMAL HIGH (ref 36.0–46.0)
Hemoglobin: 15.4 g/dL — ABNORMAL HIGH (ref 12.0–15.0)
MCH: 29.1 pg (ref 26.0–34.0)
MCHC: 32.8 g/dL (ref 30.0–36.0)
MCV: 88.8 fL (ref 80.0–100.0)
Platelets: 232 10*3/uL (ref 150–400)
RBC: 5.29 MIL/uL — ABNORMAL HIGH (ref 3.87–5.11)
RDW: 14.6 % (ref 11.5–15.5)
WBC: 10.4 10*3/uL (ref 4.0–10.5)
nRBC: 0 % (ref 0.0–0.2)

## 2023-12-29 LAB — URINALYSIS, ROUTINE W REFLEX MICROSCOPIC
Bilirubin Urine: NEGATIVE
Glucose, UA: NEGATIVE mg/dL
Hgb urine dipstick: NEGATIVE
Ketones, ur: NEGATIVE mg/dL
Leukocytes,Ua: NEGATIVE
Nitrite: NEGATIVE
Protein, ur: NEGATIVE mg/dL
Specific Gravity, Urine: 1.015 (ref 1.005–1.030)
pH: 6 (ref 5.0–8.0)

## 2023-12-29 LAB — HCG, SERUM, QUALITATIVE: Preg, Serum: NEGATIVE

## 2023-12-29 LAB — LIPASE, BLOOD: Lipase: 22 U/L (ref 11–51)

## 2023-12-29 MED ORDER — ONDANSETRON 4 MG PO TBDP: 4.0000 mg | ORAL_TABLET | Freq: Three times a day (TID) | ORAL | 0 refills | Status: DC | PRN

## 2023-12-29 NOTE — ED Triage Notes (Signed)
[  PT reports had surgery 7/8 years ago for hernia. Went to doctors appointment and was sent to [place] due to  Stomach pain/nausea/vomiting/diarrhea.  Since last Thursday.   Fever reported by pt.

## 2023-12-29 NOTE — ED Notes (Signed)
 Pt called for vitals x2 with no answer

## 2023-12-29 NOTE — ED Notes (Signed)
 Called for pt VS check, no answer

## 2023-12-29 NOTE — ED Provider Triage Note (Signed)
Emergency Medicine Provider Triage Evaluation Note  Megan Dixon , a 58 y.o. female  was evaluated in triage.  Pt complains of vomiting and abdominal pain and diarrhea x 2 weeks.  She has progressive worsening of epigastric and left upper quadrant pain rating to her back, no history of pancreatitis.  She has had previous episodes of diverticulitis and has reflux.  She also has URI symptoms.  She has been passing gas and making bowel movements.  History of ventral hernia repair  Review of Systems  Positive: Abdominal pain Negative: Fever  Physical Exam  Ht 5\' 1"  (1.549 m)   Wt 117.9 kg   BMI 49.13 kg/m  Gen:   Awake, no distress   Resp:  Normal effort  MSK:   Moves extremities without difficulty  Other:  Exquisitely tender in the epigastrium left upper quadrant of the abdomen.  Medical Decision Making  Medically screening exam initiated at 12:16 PM.  Appropriate orders placed.  Megan Dixon was informed that the remainder of the evaluation will be completed by another provider, this initial triage assessment does not replace that evaluation, and the importance of remaining in the ED until their evaluation is complete.  Workup initiated.  Patient will need CT abdomen pelvis.   Arthor Captain, PA-C 12/29/23 1218

## 2023-12-29 NOTE — Progress Notes (Deleted)
    SUBJECTIVE:   CHIEF COMPLAINT / HPI:   Vomiting:  -Started last week   PERTINENT  PMH / PSH: ***  OBJECTIVE:   There were no vitals taken for this visit.  ***  ASSESSMENT/PLAN:   No problem-specific Assessment & Plan notes found for this encounter.     Megan Headland, MD [facility]

## 2023-12-29 NOTE — Patient Instructions (Addendum)
 It was great to see you today! Thank you for choosing [facility] for your primary care.  Today we addressed: As discussed, I recommend you be seen in the ER today   If you haven't already, sign up for My Chart to have easy access to your labs results, and communication with your primary care physician.   Please arrive 15 minutes before your appointment to ensure smooth check in process.  We appreciate your efforts in making this happen.  Thank you for allowing me to participate in your care, Megan Headland, MD 12/29/2023, 10:55 AM PGY-3, [facility]

## 2023-12-29 NOTE — Progress Notes (Signed)
    SUBJECTIVE:   CHIEF COMPLAINT / HPI:   Vomiting  Diarrhea:  -Ongoing one week ago  -Eating broth, tried to eat dinner yesterday (mashed potatoes and gravy) -Drinking ginger ale, orange juice and water  -Diarrhea, yellow color  -No new medications  -Had fever intermittently -- tmax 102 -vomit x4 today--clear fluid  -No shortness of breath or chest pain  -Slight nasal congestion and post nasal drip symptoms  -Has GERD -Not yet received colonoscopy  History of incarcerated ventral hernia s/p surgical repair   PERTINENT  PMH / PSH: HTN, CVA, GERD, OSA, depression, HLD, fibromyalgia   OBJECTIVE:   BP (!) 166/92   Pulse 71   Temp 98.1 F (36.7 C)   Ht 5\' 1"  (1.549 m)   SpO2 98%   BMI 49.66 kg/m   General: Alert and oriented in no apparent distress Heart: Regular rate and rhythm with no murmurs appreciated Lungs: CTA bilaterally, no wheezing Abdomen: Bowel sounds present, significant TTP RUQ/LUQ, epigastric region, nondistended and soft  Skin: Warm and dry   ASSESSMENT/PLAN:   Assessment & Plan Nausea and vomiting, unspecified vomiting type It is possible that the patient has an infectious source of gastroenteritis.  However, given her surgical history and significant tenderness on palpation during exam with extended length of time that she has had symptoms, would expand differential to include possibility of mesenteric ischemia, colitis, diverticulitis, pancreatitis, SBO.  After evaluation of history and physical examination, felt that patient would benefit from imaging today in the emergency department.  Offered to wheelchair her to the ED, patient was amenable to this.  Discussed that it would be reasonable to get CT abdomen today given her surgical history as well as obtaining abdominal labs.  I did send Zofran  to her pharmacy for her to use after being seen in the ED.  Will update FMTS.     Ernestina Headland, MD [facility]

## 2024-01-08 ENCOUNTER — Other Ambulatory Visit: Payer: Self-pay | Admitting: Student

## 2024-01-13 NOTE — Progress Notes (Signed)
  SUBJECTIVE:   CHIEF COMPLAINT / HPI:   She presents as follow-up for her abdominal pain.  She states she has not had any further fevers and her vomiting has resolved.  She temporarily becomes nauseous and has diarrhea of 1-2 episodes per day with intermittent normal bowel movements.  Her abdominal pain is mostly left-sided.  Patient was last seen on 12/29/2023 presenting with vomiting and diarrhea with recent fever and was advised to proceed to ED.  Unfortunately, she did not stay for completed ED encounter.  Basic lab work and lipase were normal at that time.  She would also like to discuss hot flashes however is amenable to tapering this for further discussion.  PERTINENT  PMH / PSH: HTN, CVA, GERD, OSA, depression, HLD, fibromyalgia, incarcerated ventral hernia status postsurgical repair  OBJECTIVE:  BP 121/80   Pulse 90   Ht 5\' 1"  (1.549 m)   Wt 259 lb 12.8 oz (117.8 kg)   SpO2 94%   BMI 49.09 kg/m  General: Well-appearing, NAD CV: RRR, murmurs auscultated Pulm: CTAB, normal WOB Abdomen: Soft, nontender, normoactive bowel sounds, notable well-healed ventral hernia incision  ASSESSMENT/PLAN:   Assessment & Plan Diarrhea, unspecified type Improving however not resolved.  Suspect this is still related to prior viral illness.  Trial Metamucil.  May also consider constipation and trial MiraLAX at a later date. Hyperglycemia Check A1c. Colon cancer screening Referral placed to GI. Gastroesophageal reflux disease, unspecified whether esophagitis present Refilled Protonix.  Return in about 1 month (around 02/11/2024) for Pap smear and hot flashes. [name], DO 01/14/2024, 2:17 PM PGY-3, [facility]

## 2024-01-14 ENCOUNTER — Ambulatory Visit (INDEPENDENT_AMBULATORY_CARE_PROVIDER_SITE_OTHER): Payer: 59 | Admitting: Student

## 2024-01-14 VITALS — BP 121/80 | HR 90 | Ht 61.0 in | Wt 259.8 lb

## 2024-01-14 DIAGNOSIS — R739 Hyperglycemia, unspecified: Secondary | ICD-10-CM | POA: Diagnosis not present

## 2024-01-14 DIAGNOSIS — R197 Diarrhea, unspecified: Secondary | ICD-10-CM | POA: Diagnosis not present

## 2024-01-14 DIAGNOSIS — K219 Gastro-esophageal reflux disease without esophagitis: Secondary | ICD-10-CM | POA: Diagnosis not present

## 2024-01-14 DIAGNOSIS — Z1211 Encounter for screening for malignant neoplasm of colon: Secondary | ICD-10-CM | POA: Diagnosis not present

## 2024-01-14 LAB — POCT GLYCOSYLATED HEMOGLOBIN (HGB A1C): HbA1c, POC (prediabetic range): 6 % (ref 5.7–6.4)

## 2024-01-14 MED ORDER — PANTOPRAZOLE SODIUM 40 MG PO TBEC: 40.0000 mg | DELAYED_RELEASE_TABLET | Freq: Every day | ORAL | 3 refills | Status: DC

## 2024-01-14 MED ORDER — ONDANSETRON 4 MG PO TBDP: 4.0000 mg | ORAL_TABLET | Freq: Three times a day (TID) | ORAL | 0 refills | Status: DC | PRN

## 2024-01-14 NOTE — Assessment & Plan Note (Signed)
 Refilled Protonix

## 2024-01-14 NOTE — Patient Instructions (Addendum)
It was great to see you today! Thank you for choosing [facility] for your primary care.  Today we addressed: Will check an A1c and I have referred you to GI for colonoscopy. For your diarrhea, try using Metamucil.  I do not suspect you need a scan given your fever and vomiting has improved.  I will send in Zofran for the nausea that you are having.  I have refilled your Protonix. Please return to see me when you are ready for your Pap smear and to discuss hot flashes. Excellent job on your weight loss progress.  You have lost 20 pounds through dietary changes in the last 5 months.  If you haven't already, sign up for My Chart to have easy access to your labs results, and communication with your primary care physician. We are checking some labs today. If they are abnormal, I will call you. If they are normal, I will send you a MyChart message (if it is active) or a letter in the mail. If you do not hear about your labs in the next 2 weeks, please call the office. Return in about 1 month (around 02/11/2024) for Pap smear and hot flashes. Please arrive 15 minutes before your appointment to ensure smooth check in process.  We appreciate your efforts in making this happen.  Thank you for allowing me to participate in your care, Megan Mattocks, DO 01/14/2024, 1:56 PM PGY-3, [facility]

## 2024-01-19 ENCOUNTER — Telehealth: Payer: Self-pay | Admitting: Student

## 2024-01-19 NOTE — Telephone Encounter (Signed)
 Copied from CRM [id number]. Topic: Clinical - Medical Advice >> Jan 19, 2024 10:58 AM Megan Dixon wrote: Patient stated that she missed a call and is returning

## 2024-01-21 NOTE — Telephone Encounter (Signed)
 Our office did not attempt to call patient.

## 2024-01-27 ENCOUNTER — Other Ambulatory Visit: Payer: Self-pay | Admitting: Student

## 2024-01-27 DIAGNOSIS — I10 Essential (primary) hypertension: Secondary | ICD-10-CM

## 2024-01-29 ENCOUNTER — Other Ambulatory Visit: Payer: Self-pay | Admitting: Student

## 2024-02-28 ENCOUNTER — Encounter: Payer: Self-pay | Admitting: Gastroenterology

## 2024-03-13 ENCOUNTER — Other Ambulatory Visit: Payer: Self-pay | Admitting: Student

## 2024-03-13 DIAGNOSIS — M797 Fibromyalgia: Secondary | ICD-10-CM

## 2024-04-12 ENCOUNTER — Other Ambulatory Visit: Payer: Self-pay | Admitting: Student

## 2024-04-12 DIAGNOSIS — I509 Heart failure, unspecified: Secondary | ICD-10-CM

## 2024-04-12 NOTE — Progress Notes (Deleted)
 Chief Complaint: Diarrhea Primary GI MD: Para Bold  HPI:  *** is a  ***  who was referred to me by Candee Cha, MD for a complaint of *** .     Discussed the use of AI scribe software for clinical note transcription with the patient, who gave verbal consent to proceed.  History of Present Illness      PREVIOUS GI WORKUP     Past Medical History:  Diagnosis Date   Arthritis    Depression    Fibromyalgia    Hypertension    Incarcerated incisional hernia 08/10/2017   Lupus    a. was told bloodwork was possibly positive for lupus in 08/2018 by PCP.   Morbid obesity (HCC)    MS (multiple sclerosis) (HCC)    a. presumptive dx in [place], [place]   OCD (obsessive compulsive disorder)    OSA (obstructive sleep apnea)    a. dx recently, pending CPAP initiation.   Palpitations    Pneumonia 07/2018   left lung    Past Surgical History:  Procedure Laterality Date   ABDOMINAL SURGERY     BREAST EXCISIONAL BIOPSY Right    CARPAL TUNNEL RELEASE     HERNIA REPAIR     JOINT REPLACEMENT     VENTRAL HERNIA REPAIR N/A 08/09/2017   Procedure: Incarcerated ventral hernia repair with mesh ;  Surgeon: Enid Harry, MD;  Location: [facility] OR;  Service: General;  Laterality: N/A;    Current Outpatient Medications  Medication Sig Dispense Refill   albuterol  (VENTOLIN  HFA) 108 (90 Base) MCG/ACT inhaler Inhale 2 puffs into the lungs every 6 (six) hours as needed for wheezing or shortness of breath. 17 g 0   atorvastatin  (LIPITOR) 40 MG tablet Take 1 tablet (40 mg total) by mouth daily. 90 tablet 3   carvedilol  (COREG ) 12.5 MG tablet TAKE 1 TABLET (12.5MG  TOTAL) BY MOUTH TWICE A DAY WITH MEALS 180 tablet 1   clopidogrel  (PLAVIX ) 75 MG tablet TAKE 1 TABLET BY MOUTH EVERY DAY 90 tablet 3   Elastic Bandages & Supports (MEDICAL COMPRESSION STOCKINGS) MISC 1 Container by Does not apply route daily. Apply to both legs daily 2 each 2   FLUoxetine  (PROZAC ) 40 MG capsule TAKE 2  CAPSULES BY MOUTH DAILY 180 capsule 1   gabapentin  (NEURONTIN ) 300 MG capsule TAKE 1 CAPSULE BY MOUTH 3 TIMES DAILY AS NEEDED. 270 capsule 0   losartan -hydrochlorothiazide  (HYZAAR) 100-25 MG tablet Take 1 tablet by mouth daily. 90 tablet 3   ondansetron  (ZOFRAN -ODT) 4 MG disintegrating tablet TAKE 1 TABLET BY MOUTH EVERY 8 HOURS AS NEEDED FOR NAUSEA 20 tablet 0   oxyCODONE  (ROXICODONE ) 5 MG immediate release tablet Take 1 tablet (5 mg total) by mouth every 8 (eight) hours as needed for up to 5 doses for breakthrough pain. 5 tablet 0   pantoprazole  (PROTONIX ) 40 MG tablet Take 1 tablet (40 mg total) by mouth daily. 90 tablet 3   No current facility-administered medications for this visit.    Allergies as of 04/13/2024 - Review Complete 12/29/2023  Allergen Reaction Noted   Morphine  and codeine  Other (See Comments) 11/11/2015   Other Other (See Comments) 08/09/2017   Peanut-containing drug products Other (See Comments) 08/09/2017   Toradol  [ketorolac  tromethamine ] Anxiety 11/11/2015    Family History  Problem Relation Age of Onset   Cancer Mother        Leukemia   Lung cancer Father    Cancer Sister  Leukemia   Lupus Sister    Lung cancer Brother    Seizures Brother    Heart attack Brother        Happened during seizure    Social History   Socioeconomic History   Marital status: Single    Spouse name: Not on file   Number of children: Not on file   Years of education: Not on file   Highest education level: Not on file  Occupational History   Not on file  Tobacco Use   Smoking status: Former    Types: Cigarettes    Passive exposure: Current   Smokeless tobacco: Never   Tobacco comments:    Smoked for 40 years, quit in 08/2018  Vaping Use   Vaping status: Never Used  Substance and Sexual Activity   Alcohol use: Not Currently   Drug use: Never   Sexual activity: Not Currently  Other Topics Concern   Not on file  Social History Narrative   Not on file    Social Drivers of Health   Financial Resource Strain: Low Risk  (07/24/2023)   Overall Financial Resource Strain (CARDIA)    Difficulty of Paying Living Expenses: Not hard at all  Food Insecurity: No Food Insecurity (07/24/2023)   Hunger Vital Sign    Worried About Running Out of Food in the Last Year: Never true    Ran Out of Food in the Last Year: Never true  Transportation Needs: No Transportation Needs (07/24/2023)   PRAPARE - Administrator, Civil Service (Medical): No    Lack of Transportation (Non-Medical): No  Physical Activity: Inactive (07/24/2023)   Exercise Vital Sign    Days of Exercise per Week: 0 days    Minutes of Exercise per Session: 0 min  Stress: No Stress Concern Present (07/24/2023)   [employer] of Occupational Health - Occupational Stress Questionnaire    Feeling of Stress : Not at all  Social Connections: Socially Isolated (07/24/2023)   Social Connection and Isolation Panel [NHANES]    Frequency of Communication with Friends and Family: More than three times a week    Frequency of Social Gatherings with Friends and Family: Three times a week    Attends Religious Services: Never    Active Member of Clubs or Organizations: No    Attends Banker Meetings: Never    Marital Status: Never married  Intimate Partner Violence: Not At Risk (07/24/2023)   Humiliation, Afraid, Rape, and Kick questionnaire    Fear of Current or Ex-Partner: No    Emotionally Abused: No    Physically Abused: No    Sexually Abused: No    Review of Systems:    Constitutional: No weight loss, fever, chills, weakness or fatigue HEENT: Eyes: No change in vision               Ears, Nose, Throat:  No change in hearing or congestion Skin: No rash or itching Cardiovascular: No chest pain, chest pressure or palpitations   Respiratory: No SOB or cough Gastrointestinal: See HPI and otherwise negative Genitourinary: No dysuria or change in urinary  frequency Neurological: No headache, dizziness or syncope Musculoskeletal: No new muscle or joint pain Hematologic: No bleeding or bruising Psychiatric: No history of depression or anxiety    Physical Exam:  Vital signs: There were no vitals taken for this visit.  Constitutional: NAD, alert and cooperative Head:  Normocephalic and atraumatic. Eyes:   PEERL, EOMI. No icterus. Conjunctiva pink. Respiratory:  Respirations even and unlabored. Lungs clear to auscultation bilaterally.   No wheezes, crackles, or rhonchi.  Cardiovascular:  Regular rate and rhythm. No peripheral edema, cyanosis or pallor.  Gastrointestinal:  Soft, nondistended, nontender. No rebound or guarding. Normal bowel sounds. No appreciable masses or hepatomegaly. Rectal:  Declines Msk:  Symmetrical without gross deformities. Without edema, no deformity or joint abnormality.  Neurologic:  Alert and  oriented x4;  grossly normal neurologically.  Skin:   Dry and intact without significant lesions or rashes. Psychiatric: Oriented to person, place and time. Demonstrates good judgement and reason without abnormal affect or behaviors.  Physical Exam    RELEVANT LABS AND IMAGING: CBC    Component Value Date/Time   WBC 10.4 12/29/2023 1203   RBC 5.29 (H) 12/29/2023 1203   HGB 15.4 (H) 12/29/2023 1203   HGB 13.8 08/06/2020 1518   HCT 47.0 (H) 12/29/2023 1203   HCT 44.7 08/06/2020 1518   PLT 232 12/29/2023 1203   PLT 271 08/06/2020 1518   MCV 88.8 12/29/2023 1203   MCV 88 08/06/2020 1518   MCH 29.1 12/29/2023 1203   MCHC 32.8 12/29/2023 1203   RDW 14.6 12/29/2023 1203   RDW 14.4 08/06/2020 1518   LYMPHSABS 2.7 12/12/2022 0510   MONOABS 0.8 12/12/2022 0510   EOSABS 0.1 12/12/2022 0510   BASOSABS 0.0 12/12/2022 0510    CMP     Component Value Date/Time   NA 141 12/29/2023 1203   NA 142 10/21/2023 1013   K 3.7 12/29/2023 1203   CL 103 12/29/2023 1203   CO2 27 12/29/2023 1203   GLUCOSE 106 (H) 12/29/2023  1203   BUN 12 12/29/2023 1203   BUN 16 10/21/2023 1013   CREATININE 0.80 12/29/2023 1203   CALCIUM  9.3 12/29/2023 1203   PROT 7.3 12/29/2023 1203   PROT 7.2 05/01/2022 1523   ALBUMIN 3.6 12/29/2023 1203   ALBUMIN 4.1 05/01/2022 1523   AST 17 12/29/2023 1203   ALT 17 12/29/2023 1203   ALKPHOS 82 12/29/2023 1203   BILITOT 0.7 12/29/2023 1203   BILITOT <0.2 05/01/2022 1523   GFRNONAA >60 12/29/2023 1203   GFRAA 112 08/06/2020 1518     Assessment/Plan:   Assessment and Plan Assessment & Plan   Diarrhea CBC, CMP, lipase unremarkable  Chronic diastolic heart failure Echocardiogram 07/2023 with EF 60 to 65%  CVA On Plavix    Gigi Kyle [facility] 04/12/2024, 9:21 PM  Cc: Candee Cha, MD

## 2024-04-13 ENCOUNTER — Ambulatory Visit: Admitting: Gastroenterology

## 2024-05-04 ENCOUNTER — Ambulatory Visit (INDEPENDENT_AMBULATORY_CARE_PROVIDER_SITE_OTHER): Payer: 59

## 2024-05-04 VITALS — BP 150/87 | Ht 61.0 in | Wt 246.0 lb

## 2024-05-04 DIAGNOSIS — Z0000 Encounter for general adult medical examination without abnormal findings: Secondary | ICD-10-CM

## 2024-05-04 NOTE — Patient Instructions (Signed)
 [name] , Thank you for taking time out of your busy schedule to complete your Annual Wellness Visit with me. I enjoyed our conversation and look forward to speaking with you again next year. I, as well as your care team,  appreciate your ongoing commitment to your health goals. Please review the following plan we discussed and let me know if I can assist you in the future.  Your Game plan/ To Do List     Referrals: If you haven't heard from the office you've been referred to, please reach out to them at the phone number provided.   A message has been sent to your doctor letting them know you need a referral for a colonscopy and are due for a pap smear.   Here are some resources for home safety modifications to make your home safer for you.   Resource Website Printmaker https://baptistsonmission.org/Mission-Projects/Local-Ideas/Construction-Projects/Wheelchair-Ramp-Construction  Wheelchair Ramp and Guard Rails  [place]  [facility] [facility] ([place]) https://www.ball.org/  Engineer, materials for Shower  [employer] http://www.gogreenplumb.com/Contact-Us   [place], [place], or [place], [place] - plumbing assistance for those who cannot afford it. They assist 1 household per month. Nominate at website link.   [employer]  safety repairs, grab bars  [facility] [place] and [employer] Susana Enter [phone]  Home modifications so people can remain at home.  $8000 cap    Follow up Visits:  Next Medicare AWV with our clinical staff:   May 14, 2025 at 8:30 am telephone visit  Have you seen your provider in the last 6 months (3 months if uncontrolled diabetes)? Yes  Next Office Visit with your provider:  Please call the office to schedule. Message also sent to the front office to call you to schedule.   Clinician Recommendations:    Aim for 30 minutes of exercise or brisk  walking, 6-8 glasses of water, and 5 servings of fruits and vegetables each day.   I enjoyed our conversation today and look forward to talking with you again next year!! Have a wonderful and safe year. All the best, Melodie Ashworth      This is a list of the screening recommended for you and due dates:  Health Maintenance  Topic Date Due   DTaP/Tdap/Td vaccine (1 - Tdap) Never done   Pneumococcal Vaccination (1 of 2 - PCV) Never done   Colon Cancer Screening  Never done   Pap Smear  06/09/2022   COVID-19 Vaccine (3 - 2024-25 season) 08/15/2023   Mammogram  07/12/2024   Flu Shot  07/14/2024   Medicare Annual Wellness Visit  05/04/2025   Hepatitis C Screening  Completed   HIV Screening  Completed   Zoster (Shingles) Vaccine  Completed   HPV Vaccine  Aged Out   Meningitis B Vaccine  Aged Out    Advanced directives: (Declined) Advance directive discussed with you today. Even though you declined this today, please call our office should you change your mind, and we can give you the proper paperwork for you to fill out. Advance Care Planning is important because it:  [x]  Makes sure you receive the medical care that is consistent with your values, goals, and preferences  [x]  It provides guidance to your family and loved ones and reduces their decisional burden about whether or not they are making the right decisions based on your wishes.  Follow the link provided in your after visit summary or read over the paperwork we have mailed  to you to help you started getting your Advance Directives in place. If you need assistance in completing these, please reach out to us  so that we can help you!  See attachments for Preventive Care and Fall Prevention Tips.   Understanding Your Risk for Falls  Millions of people have serious injuries from falls each year. It is important to understand your risk of falling. Talk with your health care provider about your risk and what you can do to lower it. If you do  have a serious fall, make sure to tell your provider. Falling once raises your risk of falling again. How can falls affect me? Serious injuries from falls are common. These include: Broken bones, such as hip fractures. Head injuries, such as traumatic brain injuries (TBI) or concussions. A fear of falling can cause you to avoid activities and stay at home. This can make your muscles weaker and raise your risk for a fall. What can increase my risk? There are a number of risk factors that increase your risk for falling. The more risk factors you have, the higher your risk of falling. Serious injuries from a fall happen most often to people who are older than 60 years old. Teenagers and young adults ages 74-29 are also at higher risk. Common risk factors include: Weakness in the lower body. Being generally weak or confused due to long-term (chronic) illness. Dizziness or balance problems. Poor vision. Medicines that cause dizziness or drowsiness. These may include: Medicines for your blood pressure, heart, anxiety, insomnia, or swelling (edema). Pain medicines. Muscle relaxants. Other risk factors include: Drinking alcohol. Having had a fall in the past. Having foot pain or wearing improper footwear. Working at a dangerous job. Having any of the following in your home: Tripping hazards, such as floor clutter or loose rugs. Poor lighting. Pets. Having dementia or memory loss. What actions can I take to lower my risk of falling?     Physical activity Stay physically fit. Do strength and balance exercises. Consider taking a regular class to build strength and balance. Yoga and tai chi are good options. Vision Have your eyes checked every year and your prescription for glasses or contacts updated as needed. Shoes and walking aids Wear non-skid shoes. Wear shoes that have rubber soles and low heels. Do not wear high heels. Do not walk around the house in socks or slippers. Use a cane  or walker as told by your provider. Home safety Attach secure railings on both sides of your stairs. Install grab bars for your bathtub, shower, and toilet. Use a non-skid mat in your bathtub or shower. Attach bath mats securely with double-sided, non-slip rug tape. Use good lighting in all rooms. Keep a flashlight near your bed. Make sure there is a clear path from your bed to the bathroom. Use night-lights. Do not use throw rugs. Make sure all carpeting is taped or tacked down securely. Remove all clutter from walkways and stairways, including extension cords. Repair uneven or broken steps and floors. Avoid walking on icy or slippery surfaces. Walk on the grass instead of on icy or slick sidewalks. Use ice melter to get rid of ice on walkways in the winter. Use a cordless phone. Questions to ask your health care provider Can you help me check my risk for a fall? Do any of my medicines make me more likely to fall? Should I take a vitamin D supplement? What exercises can I do to improve my strength and balance? Should I  make an appointment to have my vision checked? Do I need a bone density test to check for weak bones (osteoporosis)? Would it help to use a cane or a walker? Where to find more information Centers for Disease Control and Prevention, STEADI: TonerPromos.no Community-Based Fall Prevention Programs: TonerPromos.no [employer] on Aging: BaseRingTones.pl Contact a health care provider if: You fall at home. You are afraid of falling at home. You feel weak, drowsy, or dizzy. This information is not intended to replace advice given to you by your health care provider. Make sure you discuss any questions you have with your health care provider. Document Revised: 08/03/2022 Document Reviewed: 08/03/2022 Elsevier Patient Education  2024 Elsevier Inc.  Managing Pain Without Opioids Opioids are strong medicines used to treat moderate to severe pain. For some people, especially those who have  long-term (chronic) pain, opioids may not be the best choice for pain management due to: Side effects like nausea, constipation, and sleepiness. The risk of addiction (opioid use disorder). The longer you take opioids, the greater your risk of addiction. Pain that lasts for more than 3 months is called chronic pain. Managing chronic pain usually requires more than one approach and is often provided by a team of health care providers working together (multidisciplinary approach). Pain management may be done at a pain management center or pain clinic. How to manage pain without the use of opioids Use non-opioid medicines Non-opioid medicines for pain may include: Over-the-counter or prescription non-steroidal anti-inflammatory drugs (NSAIDs). These may be the first medicines used for pain. They work well for muscle and bone pain, and they reduce swelling. Acetaminophen . This over-the-counter medicine may work well for milder pain but not swelling. Antidepressants. These may be used to treat chronic pain. A certain type of antidepressant (tricyclics) is often used. These medicines are given in lower doses for pain than when used for depression. Anticonvulsants. These are usually used to treat seizures but may also reduce nerve (neuropathic) pain. Muscle relaxants. These relieve pain caused by sudden muscle tightening (spasms). You may also use a pain medicine that is applied to the skin as a patch, cream, or gel (topical analgesic), such as a numbing medicine. These may cause fewer side effects than medicines taken by mouth. Do certain therapies as directed Some therapies can help with pain management. They include: Physical therapy. You will do exercises to gain strength and flexibility. A physical therapist may teach you exercises to move and stretch parts of your body that are weak, stiff, or painful. You can learn these exercises at physical therapy visits and practice them at home. Physical therapy  may also involve: Massage. Heat wraps or applying heat or cold to affected areas. Electrical signals that interrupt pain signals (transcutaneous electrical nerve stimulation, TENS). Weak lasers that reduce pain and swelling (low-level laser therapy). Signals from your body that help you learn to regulate pain (biofeedback). Occupational therapy. This helps you to learn ways to function at home and work with less pain. Recreational therapy. This involves trying new activities or hobbies, such as a physical activity or drawing. Mental health therapy, including: Cognitive behavioral therapy (CBT). This helps you learn coping skills for dealing with pain. Acceptance and commitment therapy (ACT) to change the way you think and react to pain. Relaxation therapies, including muscle relaxation exercises and mindfulness-based stress reduction. Pain management counseling. This may be individual, family, or group counseling.  Receive medical treatments Medical treatments for pain management include: Nerve block injections. These may include a  pain blocker and anti-inflammatory medicines. You may have injections: Near the spine to relieve chronic back or neck pain. Into joints to relieve back or joint pain. Into nerve areas that supply a painful area to relieve body pain. Into muscles (trigger point injections) to relieve some painful muscle conditions. A medical device placed near your spine to help block pain signals and relieve nerve pain or chronic back pain (spinal cord stimulation device). Acupuncture. Follow these instructions at home Medicines Take over-the-counter and prescription medicines only as told by your health care provider. If you are taking pain medicine, ask your health care providers about possible side effects to watch out for. Do not drive or use heavy machinery while taking prescription opioid pain medicine. Lifestyle  Do not use drugs or alcohol to reduce pain. If you drink  alcohol, limit how much you have to: 0-1 drink a day for women who are not pregnant. 0-2 drinks a day for men. Know how much alcohol is in a drink. In the [place] one drink equals one 12 oz bottle of beer (355 mL), one 5 oz glass of wine (148 mL), or one 1 oz glass of hard liquor (44 mL). Do not use any products that contain nicotine or tobacco. These products include cigarettes, chewing tobacco, and vaping devices, such as e-cigarettes. If you need help quitting, ask your health care provider. Eat a healthy diet and maintain a healthy weight. Poor diet and excess weight may make pain worse. Eat foods that are high in fiber. These include fresh fruits and vegetables, whole grains, and beans. Limit foods that are high in fat and processed sugars, such as fried and sweet foods. Exercise regularly. Exercise lowers stress and may help relieve pain. Ask your health care provider what activities and exercises are safe for you. If your health care provider approves, join an exercise class that combines movement and stress reduction. Examples include yoga and tai chi. Get enough sleep. Lack of sleep may make pain worse. Lower stress as much as possible. Practice stress reduction techniques as told by your therapist. General instructions Work with all your pain management providers to find the treatments that work best for you. You are an important member of your pain management team. There are many things you can do to reduce pain on your own. Consider joining an online or in-person support group for people who have chronic pain. Keep all follow-up visits. This is important. Where to find more information You can find more information about managing pain without opioids from: American Academy of Pain Medicine: painmed.org Institute for Chronic Pain: instituteforchronicpain.org American Chronic Pain Association: theacpa.org Contact a health care provider if: You have side effects from pain  medicine. Your pain gets worse or does not get better with treatments or home therapy. You are struggling with anxiety or depression. Summary Many types of pain can be managed without opioids. Chronic pain may respond better to pain management without opioids. Pain is best managed when you and a team of health care providers work together. Pain management without opioids may include non-opioid medicines, medical treatments, physical therapy, mental health therapy, and lifestyle changes. Tell your health care providers if your pain gets worse or is not being managed well enough. This information is not intended to replace advice given to you by your health care provider. Make sure you discuss any questions you have with your health care provider. Document Revised: 03/12/2021 Document Reviewed: 03/12/2021 Elsevier Patient Education  2024 [employer]. Understanding Your  Risk for Falls Millions of people have serious injuries from falls each year. It is important to understand your risk of falling. Talk with your health care provider about your risk and what you can do to lower it. If you do have a serious fall, make sure to tell your provider. Falling once raises your risk of falling again. How can falls affect me? Serious injuries from falls are common. These include: Broken bones, such as hip fractures. Head injuries, such as traumatic brain injuries (TBI) or concussions. A fear of falling can cause you to avoid activities and stay at home. This can make your muscles weaker and raise your risk for a fall. What can increase my risk? There are a number of risk factors that increase your risk for falling. The more risk factors you have, the higher your risk of falling. Serious injuries from a fall happen most often to people who are older than 60 years old. Teenagers and young adults ages 31-29 are also at higher risk. Common risk factors include: Weakness in the lower body. Being generally weak  or confused due to long-term (chronic) illness. Dizziness or balance problems. Poor vision. Medicines that cause dizziness or drowsiness. These may include: Medicines for your blood pressure, heart, anxiety, insomnia, or swelling (edema). Pain medicines. Muscle relaxants. Other risk factors include: Drinking alcohol. Having had a fall in the past. Having foot pain or wearing improper footwear. Working at a dangerous job. Having any of the following in your home: Tripping hazards, such as floor clutter or loose rugs. Poor lighting. Pets. Having dementia or memory loss. What actions can I take to lower my risk of falling?     Physical activity Stay physically fit. Do strength and balance exercises. Consider taking a regular class to build strength and balance. Yoga and tai chi are good options. Vision Have your eyes checked every year and your prescription for glasses or contacts updated as needed. Shoes and walking aids Wear non-skid shoes. Wear shoes that have rubber soles and low heels. Do not wear high heels. Do not walk around the house in socks or slippers. Use a cane or walker as told by your provider. Home safety Attach secure railings on both sides of your stairs. Install grab bars for your bathtub, shower, and toilet. Use a non-skid mat in your bathtub or shower. Attach bath mats securely with double-sided, non-slip rug tape. Use good lighting in all rooms. Keep a flashlight near your bed. Make sure there is a clear path from your bed to the bathroom. Use night-lights. Do not use throw rugs. Make sure all carpeting is taped or tacked down securely. Remove all clutter from walkways and stairways, including extension cords. Repair uneven or broken steps and floors. Avoid walking on icy or slippery surfaces. Walk on the grass instead of on icy or slick sidewalks. Use ice melter to get rid of ice on walkways in the winter. Use a cordless phone. Questions to ask your  health care provider Can you help me check my risk for a fall? Do any of my medicines make me more likely to fall? Should I take a vitamin D supplement? What exercises can I do to improve my strength and balance? Should I make an appointment to have my vision checked? Do I need a bone density test to check for weak bones (osteoporosis)? Would it help to use a cane or a walker? Where to find more information Centers for Disease Control and Prevention, STEADI: TonerPromos.no Community-Based  Fall Prevention Programs: TonerPromos.no [employer] on Aging: BaseRingTones.pl Contact a health care provider if: You fall at home. You are afraid of falling at home. You feel weak, drowsy, or dizzy. This information is not intended to replace advice given to you by your health care provider. Make sure you discuss any questions you have with your health care provider. Document Revised: 08/03/2022 Document Reviewed: 08/03/2022 Elsevier Patient Education  2024 [employer].

## 2024-05-04 NOTE — Progress Notes (Signed)
 Subjective:   Megan Dixon is a 58 y.o. who presents for a Medicare Wellness preventive visit.  As a reminder, Annual Wellness Visits don't include a physical exam, and some assessments may be limited, especially if this visit is performed virtually. We may recommend an in-person follow-up visit with your provider if needed.  Visit Complete: Virtual I connected with  Megan Dixon on 05/04/24 by a audio enabled telemedicine application and verified that I am speaking with the correct person using two identifiers.  Patient Location: Home  Provider Location: Home Office  I discussed the limitations of evaluation and management by telemedicine. The patient expressed understanding and agreed to proceed.  Vital Signs: Because this visit was a virtual/telehealth visit, some criteria may be missing or patient reported. Any vitals not documented were not able to be obtained and vitals that have been documented are patient reported.  VideoDeclined- This patient declined Librarian, academic. Therefore the visit was completed with audio only.  Persons Participating in Visit: Patient.  AWV Questionnaire: No: Patient Medicare AWV questionnaire was not completed prior to this visit.  Cardiac Risk Factors include: advanced age (>57men, >54 women);hypertension;obesity (BMI >30kg/m2);Other (see comment), Risk factor comments: OSA     Objective:     Today's Vitals   05/04/24 1507 05/04/24 1513  BP: (!) 150/87   Weight: 246 lb (111.6 kg)   Height: 5\' 1"  (1.549 m)   PainSc:  10-Worst pain ever   Body mass index is 46.48 kg/m.     05/04/2024    3:22 PM 12/29/2023   11:53 AM 12/29/2023   10:55 AM 10/14/2023    1:25 PM 08/10/2023    1:43 PM 07/24/2023    8:00 PM 07/12/2023    9:24 AM  Advanced Directives  Does Patient Have a Medical Advance Directive? No No No No No No No  Would patient like information on creating a medical advance directive? No - Patient  declined No - Guardian declined No - Patient declined No - Patient declined No - Patient declined Yes (MAU/Ambulatory/Procedural Areas - Information given)     Current Medications (verified) Outpatient Encounter Medications as of 05/04/2024  Medication Sig   albuterol  (VENTOLIN  HFA) 108 (90 Base) MCG/ACT inhaler Inhale 2 puffs into the lungs every 6 (six) hours as needed for wheezing or shortness of breath.   atorvastatin  (LIPITOR) 40 MG tablet Take 1 tablet (40 mg total) by mouth daily.   carvedilol  (COREG ) 12.5 MG tablet TAKE 1 TABLET (12.5MG  TOTAL) BY MOUTH TWICE A DAY WITH MEALS   clopidogrel  (PLAVIX ) 75 MG tablet TAKE 1 TABLET BY MOUTH EVERY DAY   Elastic Bandages & Supports (MEDICAL COMPRESSION STOCKINGS) MISC 1 Container by Does not apply route daily. Apply to both legs daily   FLUoxetine  (PROZAC ) 40 MG capsule TAKE 2 CAPSULES BY MOUTH DAILY   gabapentin  (NEURONTIN ) 300 MG capsule TAKE 1 CAPSULE BY MOUTH 3 TIMES DAILY AS NEEDED.   losartan -hydrochlorothiazide  (HYZAAR) 100-25 MG tablet Take 1 tablet by mouth daily.   ondansetron  (ZOFRAN -ODT) 4 MG disintegrating tablet TAKE 1 TABLET BY MOUTH EVERY 8 HOURS AS NEEDED FOR NAUSEA   oxyCODONE  (ROXICODONE ) 5 MG immediate release tablet Take 1 tablet (5 mg total) by mouth every 8 (eight) hours as needed for up to 5 doses for breakthrough pain.   pantoprazole  (PROTONIX ) 40 MG tablet Take 1 tablet (40 mg total) by mouth daily.   No facility-administered encounter medications on file as of 05/04/2024.    Allergies (verified)  Morphine  and codeine , Other, Peanut-containing drug products, and Toradol  [ketorolac  tromethamine ]   History: Past Medical History:  Diagnosis Date   Arthritis    Depression    Fibromyalgia    Hypertension    Incarcerated incisional hernia 08/10/2017   Lupus    a. was told bloodwork was possibly positive for lupus in 08/2018 by PCP.   Morbid obesity (HCC)    MS (multiple sclerosis) (HCC)    a. presumptive dx in  [place], [place]   OCD (obsessive compulsive disorder)    OSA (obstructive sleep apnea)    a. dx recently, pending CPAP initiation.   Palpitations    Pneumonia 07/2018   left lung   Past Surgical History:  Procedure Laterality Date   ABDOMINAL SURGERY     BREAST EXCISIONAL BIOPSY Right    CARPAL TUNNEL RELEASE     HERNIA REPAIR     JOINT REPLACEMENT     VENTRAL HERNIA REPAIR N/A 08/09/2017   Procedure: Incarcerated ventral hernia repair with mesh ;  Surgeon: Enid Harry, MD;  Location: [facility] OR;  Service: General;  Laterality: N/A;   Family History  Problem Relation Age of Onset   Cancer Mother        Leukemia   Lung cancer Father    Cancer Sister        Leukemia   Lupus Sister    Lung cancer Brother    Seizures Brother    Heart attack Brother        Happened during seizure   Social History   Socioeconomic History   Marital status: Single    Spouse name: Not on file   Number of children: Not on file   Years of education: Not on file   Highest education level: Not on file  Occupational History   Not on file  Tobacco Use   Smoking status: Former    Types: Cigarettes    Passive exposure: Current   Smokeless tobacco: Never   Tobacco comments:    Smoked for 40 years, quit in 08/2018  Vaping Use   Vaping status: Never Used  Substance and Sexual Activity   Alcohol use: Not Currently   Drug use: Never   Sexual activity: Not Currently  Other Topics Concern   Not on file  Social History Narrative   Not on file   Social Drivers of Health   Financial Resource Strain: Low Risk  (05/04/2024)   Overall Financial Resource Strain (CARDIA)    Difficulty of Paying Living Expenses: Not hard at all  Food Insecurity: No Food Insecurity (05/04/2024)   Hunger Vital Sign    Worried About Running Out of Food in the Last Year: Never true    Ran Out of Food in the Last Year: Never true  Transportation Needs: No Transportation Needs (05/04/2024)   PRAPARE - Therapist, art (Medical): No    Lack of Transportation (Non-Medical): No  Physical Activity: Sufficiently Active (05/04/2024)   Exercise Vital Sign    Days of Exercise per Week: 7 days    Minutes of Exercise per Session: 30 min  Stress: No Stress Concern Present (05/04/2024)   [employer] of Occupational Health - Occupational Stress Questionnaire    Feeling of Stress : Not at all  Social Connections: Moderately Isolated (05/04/2024)   Social Connection and Isolation Panel [NHANES]    Frequency of Communication with Friends and Family: More than three times a week    Frequency of Social  Gatherings with Friends and Family: More than three times a week    Attends Religious Services: More than 4 times per year    Active Member of Clubs or Organizations: No    Attends Banker Meetings: Never    Marital Status: Widowed    Tobacco Counseling Counseling given: Yes Tobacco comments: Smoked for 40 years, quit in 08/2018    Clinical Intake:  Pre-visit preparation completed: Yes  Pain : 0-10 Pain Score: 10-Worst pain ever Pain Type: Chronic pain Pain Location: Neck Pain Orientation: Posterior Pain Radiating Towards: bilateral shoulders Pain Descriptors / Indicators: Constant Pain Onset: More than a month ago Pain Frequency: Constant     BMI - recorded: 46.48 Nutritional Status: BMI > 30  Obese Nutritional Risks: None Diabetes: No  Lab Results  Component Value Date   HGBA1C 6.0 01/14/2024   HGBA1C 6.2 (A) 06/09/2021   HGBA1C 5.9 (H) 11/23/2019     How often do you need to have someone help you when you read instructions, pamphlets, or other written materials from your doctor or pharmacy?: 1 - Never  Interpreter Needed?: No  Information entered by :: Sally Crazier CMA   Activities of Daily Living     05/04/2024    3:18 PM 07/24/2023    7:59 PM  In your present state of health, do you have any difficulty performing the following activities:  Hearing? 0  0  Vision? 0 0  Difficulty concentrating or making decisions? 0 0  Walking or climbing stairs? 1 0  Comment due to chronic pain   Dressing or bathing? 0 0  Doing errands, shopping? 0 0  Preparing Food and eating ? N N  Using the Toilet? N N  In the past six months, have you accidently leaked urine? N N  Do you have problems with loss of bowel control? N N  Managing your Medications? N N  Managing your Finances? N N  Housekeeping or managing your Housekeeping? N N    Patient Care Team: Veronia Goon, DO as PCP - General (Family Medicine) Sheryle Donning, MD as PCP - Cardiology (Cardiology) Sheryle Donning, MD as Consulting Physician (Cardiology)  Indicate any recent Medical Services you may have received from other than [facility] providers in the past year (date may be approximate).     Assessment:    This is a routine wellness examination for [place].  Hearing/Vision screen Hearing Screening - Comments:: Patient denies any hearing difficulties.   Vision Screening - Comments:: Patient is not up to date on yearly eye exams.  Referral to ophthalmology placed.     Goals Addressed               This Visit's Progress     Patient Stated (pt-stated)        I want to lose weight and get my right knee fixed.        Depression Screen     05/04/2024    3:22 PM 12/29/2023   10:55 AM 10/14/2023    1:28 PM 08/10/2023    1:43 PM 08/06/2023    9:57 AM 07/24/2023    7:56 PM 07/12/2023    9:24 AM  PHQ 2/9 Scores  PHQ - 2 Score 0 0 4 0 0 0 0  PHQ- 9 Score 5 1 13  0 0 2 2    Fall Risk     05/04/2024    3:20 PM 07/24/2023    7:59 PM 09/30/2020    1:25 PM 08/06/2020  2:08 PM 01/01/2020    2:12 PM  Fall Risk   Falls in the past year? 0 0 0 0 0  Number falls in past yr: 0 0  0 0  Injury with Fall? 0 0  0 0  Risk for fall due to : No Fall Risks No Fall Risks     Follow up Falls evaluation completed Falls prevention discussed;Education provided;Falls evaluation  completed       MEDICARE RISK AT HOME:  Medicare Risk at Home Any stairs in or around the home?: Yes If so, are there any without handrails?: No Home free of loose throw rugs in walkways, pet beds, electrical cords, etc?: Yes Adequate lighting in your home to reduce risk of falls?: Yes Life alert?: No Use of a cane, walker or w/c?: Yes Grab bars in the bathroom?: No Shower chair or bench in shower?: No Elevated toilet seat or a handicapped toilet?: No  TIMED UP AND GO:  Was the test performed?  No  Cognitive Function: 6CIT completed        05/04/2024    3:21 PM 07/24/2023    8:00 PM  6CIT Screen  What Year? 0 points 0 points  What month? 0 points 0 points  What time? 0 points 0 points  Count back from 20 0 points 0 points  Months in reverse 0 points 0 points  Repeat phrase 0 points 0 points  Total Score 0 points 0 points    Immunizations Immunization History  Administered Date(s) Administered   PFIZER(Purple Top)SARS-COV-2 Vaccination 02/17/2020, 03/09/2020   Zoster Recombinant(Shingrix) 04/07/2019, 07/06/2019    Screening Tests Health Maintenance  Topic Date Due   DTaP/Tdap/Td (1 - Tdap) Never done   Pneumococcal Vaccine 26-28 Years old (1 of 2 - PCV) Never done   Colonoscopy  Never done   Cervical Cancer Screening (Pap smear)  06/09/2022   COVID-19 Vaccine (3 - 2024-25 season) 08/15/2023   MAMMOGRAM  07/12/2024   INFLUENZA VACCINE  07/14/2024   Medicare Annual Wellness (AWV)  05/04/2025   Hepatitis C Screening  Completed   HIV Screening  Completed   Zoster Vaccines- Shingrix  Completed   HPV VACCINES  Aged Out   Meningococcal B Vaccine  Aged Out    Health Maintenance  Health Maintenance Due  Topic Date Due   DTaP/Tdap/Td (1 - Tdap) Never done   Pneumococcal Vaccine 52-3 Years old (1 of 2 - PCV) Never done   Colonoscopy  Never done   Cervical Cancer Screening (Pap smear)  06/09/2022   COVID-19 Vaccine (3 - 2024-25 season) 08/15/2023   Health  Maintenance Items Addressed:   Additional Screening:  Vision Screening: Recommended annual ophthalmology exams for early detection of glaucoma and other disorders of the eye.  Dental Screening: Recommended annual dental exams for proper oral hygiene  Community Resource Referral / Chronic Care Management: CRR required this visit?  No   CCM required this visit?  No   Plan:    I have personally reviewed and noted the following in the patient's chart:   Medical and social history Use of alcohol, tobacco or illicit drugs  Current medications and supplements including opioid prescriptions. Patient is currently taking opioid prescriptions. Information provided to patient regarding non-opioid alternatives. Patient advised to discuss non-opioid treatment plan with their provider. Functional ability and status Nutritional status Physical activity Advanced directives List of other physicians Hospitalizations, surgeries, and ER visits in previous 12 months Vitals Screenings to include cognitive, depression, and falls Referrals  and appointments  In addition, I have reviewed and discussed with patient certain preventive protocols, quality metrics, and best practice recommendations. A written personalized care plan for preventive services as well as general preventive health recommendations were provided to patient.   Flara Storti, CMA   05/04/2024   After Visit Summary: (Mail) Due to this being a telephonic visit, the after visit summary with patients personalized plan was offered to patient via mail   Notes: Please refer to Routing Comments.

## 2024-05-23 ENCOUNTER — Other Ambulatory Visit: Payer: Self-pay | Admitting: Student

## 2024-05-23 DIAGNOSIS — F32A Depression, unspecified: Secondary | ICD-10-CM

## 2024-05-23 DIAGNOSIS — I509 Heart failure, unspecified: Secondary | ICD-10-CM

## 2024-06-07 ENCOUNTER — Ambulatory Visit: Admitting: Student

## 2024-07-12 ENCOUNTER — Other Ambulatory Visit: Payer: Self-pay

## 2024-07-12 DIAGNOSIS — E785 Hyperlipidemia, unspecified: Secondary | ICD-10-CM

## 2024-07-12 MED ORDER — ATORVASTATIN CALCIUM 40 MG PO TABS
40.0000 mg | ORAL_TABLET | Freq: Every day | ORAL | 3 refills | Status: AC
Start: 2024-07-12 — End: ?

## 2024-07-26 ENCOUNTER — Other Ambulatory Visit: Payer: Self-pay

## 2024-07-26 DIAGNOSIS — M797 Fibromyalgia: Secondary | ICD-10-CM

## 2024-07-27 MED ORDER — GABAPENTIN 300 MG PO CAPS: 300.0000 mg | ORAL_CAPSULE | Freq: Three times a day (TID) | ORAL | 0 refills | Status: DC | PRN

## 2024-08-18 ENCOUNTER — Other Ambulatory Visit: Payer: Self-pay

## 2024-08-18 DIAGNOSIS — F32A Depression, unspecified: Secondary | ICD-10-CM

## 2024-08-21 MED ORDER — FLUOXETINE HCL 40 MG PO CAPS: 80.0000 mg | ORAL_CAPSULE | Freq: Every day | ORAL | 1 refills | Status: AC

## 2024-08-22 ENCOUNTER — Ambulatory Visit

## 2024-08-24 ENCOUNTER — Emergency Department (HOSPITAL_COMMUNITY)
Admission: EM | Admit: 2024-08-24 | Discharge: 2024-08-24 | Disposition: A | Discharge status: Home / Self Care | Attending: Emergency Medicine | Admitting: Emergency Medicine

## 2024-08-24 ENCOUNTER — Emergency Department (HOSPITAL_COMMUNITY)

## 2024-08-24 ENCOUNTER — Other Ambulatory Visit: Payer: Self-pay

## 2024-08-24 DIAGNOSIS — I10 Essential (primary) hypertension: Secondary | ICD-10-CM | POA: Diagnosis not present

## 2024-08-24 DIAGNOSIS — M47816 Spondylosis without myelopathy or radiculopathy, lumbar region: Secondary | ICD-10-CM | POA: Diagnosis not present

## 2024-08-24 DIAGNOSIS — M5126 Other intervertebral disc displacement, lumbar region: Secondary | ICD-10-CM | POA: Diagnosis not present

## 2024-08-24 DIAGNOSIS — M79661 Pain in right lower leg: Secondary | ICD-10-CM | POA: Diagnosis not present

## 2024-08-24 DIAGNOSIS — M25551 Pain in right hip: Secondary | ICD-10-CM | POA: Diagnosis not present

## 2024-08-24 DIAGNOSIS — M79604 Pain in right leg: Secondary | ICD-10-CM | POA: Diagnosis not present

## 2024-08-24 LAB — RESP PANEL BY RT-PCR (RSV, FLU A&B, COVID)  RVPGX2
Influenza A by PCR: NEGATIVE
Influenza B by PCR: NEGATIVE
Resp Syncytial Virus by PCR: NEGATIVE
SARS Coronavirus 2 by RT PCR: NEGATIVE

## 2024-08-24 MED ORDER — ACETAMINOPHEN 500 MG PO TABS
1000.0000 mg | ORAL_TABLET | Freq: Once | ORAL | Status: DC
  Filled 2024-08-24: qty 2

## 2024-08-24 MED ORDER — ACETAMINOPHEN 500 MG PO TABS: 1000.0000 mg | ORAL_TABLET | Freq: Once | ORAL | Status: DC

## 2024-08-24 MED ORDER — LIDOCAINE 5 % EX PTCH
1.0000 | MEDICATED_PATCH | Freq: Once | CUTANEOUS | Status: DC
Start: 2024-08-24 — End: 2024-08-24
  Administered 2024-08-24: 1 via TRANSDERMAL
  Filled 2024-08-24: qty 1

## 2024-08-24 MED ORDER — OXYCODONE-ACETAMINOPHEN 5-325 MG PO TABS
1.0000 | ORAL_TABLET | Freq: Once | ORAL | Status: AC
  Administered 2024-08-24: 1 via ORAL
  Filled 2024-08-24: qty 1

## 2024-08-24 NOTE — Progress Notes (Signed)
 RLE venous duplex has been completed.  Preliminary results given to Dr. Dionisio.   Results can be found under chart review under CV PROC. 08/24/2024 5:37 PM Toshiko Kemler RVT, RDMS

## 2024-08-24 NOTE — Discharge Instructions (Addendum)
 You were seen in the emergency department today for right-sided back, hip and leg pain.  While you are here we did a physical exam, x-rays and an ultrasound of your leg which were all reassuring and showed no evidence of fracture, dislocation or blood clot.  Please follow-up with your primary care doctor tomorrow for evaluation consideration of physical therapy.  Come back to the emergency department if you have severe pain and are unable to walk, develop high fevers or if you have any other reason to believe you need emergency care.

## 2024-08-24 NOTE — ED Provider Triage Note (Signed)
 Emergency Medicine Provider Triage Evaluation Note  Megan Dixon , a 57 y.o. female  was evaluated in triage.  Pt complains of nasal congestion and right hip pain..  Review of Systems  Positive: Nasal congestion right hip pain rating down to the right leg Negative: Denies loss of bowel or bladder fever chills trauma  Physical Exam  BP (!) 176/88 (BP Location: Right Arm)   Pulse 72   Temp 97.8 F (36.6 C)   Resp 17   Ht 5' 1.5 (1.562 m)   Wt 112 kg   SpO2 96%   BMI 45.91 kg/m  Gen:   Awake, no distress   Resp:  Normal effort  MSK:   Moves extremities without difficulty  Other:  Tenderness of the right hip tenderness of the paraspinal lumbar muscles on the right  Medical Decision Making  Medically screening exam initiated at 10:49 AM.  Appropriate orders placed.  Megan Dixon was informed that the remainder of the evaluation will be completed by another provider, this initial triage assessment does not replace that evaluation, and the importance of remaining in the ED until their evaluation is complete.     Ruthe Cornet, DO 08/24/24 1051

## 2024-08-24 NOTE — ED Triage Notes (Signed)
 Pt c.o right hip pain x 2 days, the pain radiates down her leg. Pt also c.o head congestion.

## 2024-08-24 NOTE — ED Notes (Signed)
 Pt. Refused tylenol  , took 2 3 hours ago.

## 2024-08-24 NOTE — ED Provider Notes (Signed)
 [facility] Provider Note HPI Megan Dixon is a 58 y.o. female with a medical history as below who presents to the emergency department for 2 days of worsening right leg pain.  Pain starts in the right lumbar spine and radiates down her right leg to the knee.  Patient states that she has a history of fibromyalgia and osteoarthritis.  No recent trauma to the back or leg.  No recent procedures to the back.  No cancer history.  No history of IVDU.  No numbness.  Not on blood thinners.  She is stable to walk with a cane and is able to bear weight on the leg  Past Medical History:  Diagnosis Date   Arthritis    Depression    Fibromyalgia    Hypertension    Incarcerated incisional hernia 08/10/2017   Lupus    a. was told bloodwork was possibly positive for lupus in 08/2018 by PCP.   Morbid obesity (HCC)    MS (multiple sclerosis) (HCC)    a. presumptive dx in [place], [place]   OCD (obsessive compulsive disorder)    OSA (obstructive sleep apnea)    a. dx recently, pending CPAP initiation.   Palpitations    Pneumonia 07/2018   left lung   Past Surgical History:  Procedure Laterality Date   ABDOMINAL SURGERY     BREAST EXCISIONAL BIOPSY Right    CARPAL TUNNEL RELEASE     HERNIA REPAIR     JOINT REPLACEMENT     VENTRAL HERNIA REPAIR N/A 08/09/2017   Procedure: Incarcerated ventral hernia repair with mesh ;  Surgeon: Ebbie Cough, MD;  Location[facility] OR;  Service: General;  Laterality: N/A;    Review of Systems Pertinent positives and negative findings are listed as part of the History of Present Illness and MDM  Physical Exam Vitals:   08/24/24 1032 08/24/24 1315 08/24/24 1800 08/24/24 1828  BP:  (!) 145/80 (!) 186/78 (!) 192/86  Pulse:  71 70 80  Resp:  16  17  Temp:  97.8 F (36.6 C)  (!) 97.3 F (36.3 C)  TempSrc:    Temporal  SpO2:  97% 96% 93%  Weight: 112 kg     Height: 5' 1.5 (1.562 m)        Constitutional Nursing  notes reviewed Vital signs reviewed  HEENT No obvious trauma Pupils round, equal, and reactive to light. Pupils cross midline Uvula midline  Respiratory Effort normal Breathing well on room air CTAB  CV Normal rate and rhythm  No pitting edema  Abdomen Soft, non-tender, non-distended No peritonitis  Back No midline C, T or L-spine tenderness to palpation Right lumbar paraspinal muscular tenderness to palpation  MSK Atraumatic Tenderness over the right thigh, right knee and right calf 2+ palpable DP pulse in the right Patient able to ambulate and bear weight on the leg Full range of motion of the leg 4/5 strength in hip flexion, knee flexion/extension, ankle plantarflexion/dorsiflexion No overlying ecchymosis, erythema or warmth  Neuro Awake and alert Pupils cross midline Moving all extremities    MDM:  Initial Differential Diagnoses includes radiculopathy, epidural abscess, cauda equina, conus medullaris, muscular pain, DVT, peripheral artery disease, urolithiasis, AAA, septic arthritis  I reviewed the patient's vitals, the nursing triage note and evaluated the patient at bedside.  Patient presents with 2 days of worsening right lumbar back pain and right lower extremity pain.  Chart review reveals that she has a history of  fibromyalgia and lumbar spine OA.  Exam notable for right paraspinal muscle tenderness to palpation in the lumbar region, right anterior thigh tenderness, right knee tenderness and right calf tenderness.  No swelling, erythema, ecchymosis or warmth to right lower extremity.  No midline back tenderness to palpation.  I considered life-threatening back pathologies as listed in the differential but have low suspicion as the patient is not febrile, has no history of IV drug use, no cancer history, no recent trauma or procedures to the back.  She has no urinary incontinence.  She has no saddle anesthesia on my exam.  She is able to ambulate with her cane.  I have low  suspicion for peripheral artery disease/occlusion as the patient has a palpable 2+ DP pulse on the right.  I considered intra-abdominal pathologies but the patient has a soft abdomen throughout with no tenderness to palpation.  She denies abdominal pain.  I considered septic arthritis but of low suspicion as the patient is able to range the leg, can bear weight on the leg, and is afebrile.  There is no warmth over the knee when compared to the left.  Plain films of the back and right hip are reassuring with no acute abnormalities.  There is evidence of lumbar osteoarthritis.  I reviewed these images myself.  Patient has a contraindication to NSAIDs so I gave her a dose of oxycodone  and a lidocaine  patch.  She does not drive.  I considered DVT and ordered DVT ultrasound which showed no evidence of DVT.  COVID flu testing ordered in triage interpreted by myself which showed no evidence of COVID, flu or RSV.  Patient has great outpatient follow-up and is seeing her PCP tomorrow.  She was discharged with return precautions.  She would likely benefit from physical therapy.   Procedures: Procedures  Medications administered in the ED: Medications  lidocaine  (LIDODERM ) 5 % 1 patch (1 patch Transdermal Patch Applied 08/24/24 1827)  oxyCODONE -acetaminophen  (PERCOCET/ROXICET) 5-325 MG per tablet 1 tablet (1 tablet Oral Given 08/24/24 1827)     Impression: 1. Right leg pain      Patient's presentation is most consistent with acute presentation with potential threat to life or bodily function.  Disposition: ED Disposition:  Discharge   Discharge: Patient is felt to be medically appropriate for discharge at this time. Patient was instructed to follow up with their primary care doctor/specialists listed above for re-evaluation. Patient was given strict return precautions.  ED Discharge Orders     None             Dionisio Blunt, MD 08/24/24 1839    Yolande Lamar BROCKS, MD 08/27/24  FONTAINE

## 2024-08-25 ENCOUNTER — Ambulatory Visit (INDEPENDENT_AMBULATORY_CARE_PROVIDER_SITE_OTHER)

## 2024-08-25 ENCOUNTER — Telehealth: Payer: Self-pay

## 2024-08-25 VITALS — BP 153/86 | HR 81 | Ht 61.0 in | Wt 246.4 lb

## 2024-08-25 DIAGNOSIS — J019 Acute sinusitis, unspecified: Secondary | ICD-10-CM

## 2024-08-25 DIAGNOSIS — M25561 Pain in right knee: Secondary | ICD-10-CM | POA: Diagnosis not present

## 2024-08-25 DIAGNOSIS — G8929 Other chronic pain: Secondary | ICD-10-CM

## 2024-08-25 DIAGNOSIS — M25551 Pain in right hip: Secondary | ICD-10-CM

## 2024-08-25 MED ORDER — AMOXICILLIN 500 MG PO CAPS: 1000.0000 mg | ORAL_CAPSULE | Freq: Three times a day (TID) | ORAL | 0 refills | Status: AC

## 2024-08-25 MED ORDER — MELOXICAM 15 MG PO TABS
15.0000 mg | ORAL_TABLET | Freq: Every day | ORAL | 0 refills | Status: DC
Start: 2024-08-25 — End: 2024-09-08

## 2024-08-25 MED ORDER — LIDOCAINE 5 % EX PTCH: 1.0000 | MEDICATED_PATCH | CUTANEOUS | 0 refills | Status: DC

## 2024-08-25 NOTE — Progress Notes (Signed)
 SUBJECTIVE:   CHIEF COMPLAINT / HPI:   Patient presents today w/ over a week of sinus pressure, yellow/green drainage from her nose and into her mouth, productive cough, 2 days of fever to 101, and the sensation of having an ocean in my head. She tried taking sinus pills but they didn't provide much benefit.   Patient also presents with 4 days of acute on chronic R knee and hip pain. She went to the ED on 9/11 where they obtained xrays of her hip and back which were negative for acute fx but demonstrated joint space narrowly in b/l hips and spondylosis w/ DDD in her lumbar spine. She reports a long hx of R knee pain w/ prior arthroscopic procedure. L knee was replaced which worked well for her. She is taking tylenol  arthritis which helps a little but hasn't provided as much benefit as it usually does. Her pain is so bad that's she's had a lot of trouble walking, even with her cane. She d/c'd her gabapentin  2 weeks ago because it was tearing up her stomach, felt like it would get stuck in her throat for a moment and then once it went into her stomach it caused pain and upset stomach. She received percocet and a lidocaine  patch at the ED which helped her sleep through the night last night.   She denies any recent injury, popping, twisting, or acute moment when her knee pain was exacerbated. She denies having gotten recent injections in the knee. She denies any fevers today, last fever was Monday 4 days ago related to her sinus symptoms.   PERTINENT  PMH / PSH: Fibromyalgia, L TKA, lumbar spondylosis and DDD.   OBJECTIVE:   BP (!) 153/86   Pulse 81   Ht 5' 1 (1.549 m)   Wt 246 lb 6.4 oz (111.8 kg)   SpO2 95%   BMI 46.56 kg/m   General: Awake, alert, NAD. Communicates clearly. HEENT: NCAT. PERRLA EOMI. Anicteric sclera, conjunctiva clear. TM intact w/o effusion, EAC patent. MMM, clear OP w/ no exudates or erythema. Moderate turbinate inflammation. TTP of frontal and maxillary sinuses.   MSK:  -Diffuse R knee pain to palpation w/ significant guarding confounds structural exam. Mcmurray testing positive for significant pain on initial exam w/o appreciable locking. Swelling appreciated on medial aspect w/o erythema or significant warmth. TTP over attachments of quad tendon, patellar tendon, medial and lateral joint lines, as well as posteriorly.  -R hip w/ full flexion and ext rotation w/ significant pain on int rotation. Mild reductions in abduction and adduction, ROM confounded by guarding. Exam otherwise limited by pain and guarding.  Skin: No rash or lesions appreciated. No skin changes over R knee, hip or low back appreciated.   ASSESSMENT/PLAN:   Assessment & Plan Chronic pain of right knee Pain of right hip Patient w/ acute on chronic R knee pain impacting her ability to walk, with diffuse positive findings on exam. Low concern for septic joint given afebrile, stable vitals and no systemic symptoms.  -R knee xray, prior in 2020. No evidence of R knee xray in ED on 9/11.  -Meloxicam  15mg  PO daily for knee, hip pain  -Lidocaine  patch daily PRN for knee and hip pain -RTC in 2 weeks to discuss results and further management Acute sinusitis, recurrence not specified, unspecified location Over a week of sinus congestion, drainage of yellow/green phlegm w/ cough and 2 days of fever.  -Amoxicillin  1g TID x 5 days  Patient verbalized understanding  and agrees w/ plan. RTC in 2w   Leafy Scriver, DO [facility]

## 2024-08-25 NOTE — Patient Instructions (Signed)
 It was great to see you today!  Today we talked about your knee and hip pain, as well as your sinus symptoms.   For your R leg pain, we'll get new knee X rays and you'll take an anti inflammatory medicine until your next follow up visit. At that point we can discuss physical therapy to try to restore your function and help manage your pain. We can also use the information from the X ray to determine if you're a candidate for knee replacement in the future.   I'm prescribing you 5 days of amoxicillin  for your sinus infection given how long it has been going on for. Take 2 pills 3 times per day and take the whole course even if you feel better before the end.   Please follow up in 2 weeks.  Please schedule your follow up visit at the front desk before you leave today.   Thank you for choosing [facility].   Please call [phone] with any questions about today's appointment.  Leafy Scriver, DO Family Medicine

## 2024-08-25 NOTE — Telephone Encounter (Signed)
 Prior authorization submitted for LIDOCAINE  5% PATCHES to [employer] via Latent.   Key: [facility]

## 2024-08-28 NOTE — Telephone Encounter (Signed)
 Pharmacy Patient Advocate Encounter  Received notification from [employer] that Prior Authorization for LIDOCAINE  5% PATCHES has been DENIED.  Full denial letter will be uploaded to the media tab. See denial reason below.  LIDOCAINE  DIS 5% PATCH is not FDA approved for your medical condition(s): Pain in right hip. These condition(s) are not supported by one of the accepted references. Therefore your drug is denied because it is not being used for a medically accepted indication.   Lidocaine  4% patches available OTC  PA #/Case ID/Reference #: [id number]

## 2024-08-31 ENCOUNTER — Telehealth: Payer: Self-pay

## 2024-08-31 NOTE — Telephone Encounter (Signed)
 Patient calls nurse line requesting cough medicine.   She reports she was seen last week for a sinus infection and was given an antibiotic.   She reports she does feel better, however her cough is effecting her day to day. She reports she is not sleeping and was coughing through most of our phone call.   She reports the cough is productive with whitish yellow mucous.   She denies any worsening symptoms. She denies any fevers or chills. No shortness of breath.   She reports Tessalon  has worked well for her in the past. She has not tried any OTC.   She has an apt with PCP on 9/26.  Will forward to PCP.   Precautions discussed.

## 2024-09-01 MED ORDER — BENZONATATE 100 MG PO CAPS: 100.0000 mg | ORAL_CAPSULE | Freq: Two times a day (BID) | ORAL | 0 refills | Status: AC | PRN

## 2024-09-01 NOTE — Telephone Encounter (Signed)
 Called patient. Medication to the pharmacy discussed.

## 2024-09-04 ENCOUNTER — Ambulatory Visit
Admission: RE | Admit: 2024-09-04 | Discharge: 2024-09-04 | Disposition: A | Discharge status: Home / Self Care | Source: Ambulatory Visit | Attending: Family Medicine | Admitting: Family Medicine

## 2024-09-04 DIAGNOSIS — G8929 Other chronic pain: Secondary | ICD-10-CM

## 2024-09-04 DIAGNOSIS — M1711 Unilateral primary osteoarthritis, right knee: Secondary | ICD-10-CM | POA: Diagnosis not present

## 2024-09-07 ENCOUNTER — Ambulatory Visit: Payer: Self-pay

## 2024-09-08 ENCOUNTER — Ambulatory Visit

## 2024-09-08 VITALS — BP 168/83 | HR 70 | Ht 61.0 in | Wt 236.2 lb

## 2024-09-08 DIAGNOSIS — M25561 Pain in right knee: Secondary | ICD-10-CM

## 2024-09-08 DIAGNOSIS — I998 Other disorder of circulatory system: Secondary | ICD-10-CM

## 2024-09-08 DIAGNOSIS — I10 Essential (primary) hypertension: Secondary | ICD-10-CM

## 2024-09-08 DIAGNOSIS — H8190 Unspecified disorder of vestibular function, unspecified ear: Secondary | ICD-10-CM

## 2024-09-08 DIAGNOSIS — G8929 Other chronic pain: Secondary | ICD-10-CM | POA: Diagnosis not present

## 2024-09-08 DIAGNOSIS — Z1211 Encounter for screening for malignant neoplasm of colon: Secondary | ICD-10-CM

## 2024-09-08 DIAGNOSIS — Z1231 Encounter for screening mammogram for malignant neoplasm of breast: Secondary | ICD-10-CM

## 2024-09-08 MED ORDER — MELOXICAM 15 MG PO TABS: 15.0000 mg | ORAL_TABLET | Freq: Every day | ORAL | 0 refills | Status: DC

## 2024-09-08 NOTE — Progress Notes (Signed)
 SUBJECTIVE:   CHIEF COMPLAINT / HPI:   Patient presents today for further discussion of her right knee pain.  She reports that the meloxicam  took her pain from a 10 out of 10 to a 5 out of 10 until she ran out of it.  She continues to use a cane for walking, still feels unsteady and reports 1 near fall while she was getting her knee x-ray, but denies any acute falls.  She reports not taking gabapentin , as she feels it was causing significant leg swelling and was not providing her with much pain relief anymore.  Spent a few minutes going over the report and images from her knee x-ray, answered all of patient's questions.  She expresses strong preference to proceed towards total knee replacement, as she experienced such a great benefit from her first 1 on the left side.  She expresses a willingness to try some physical therapy as she knows that will be required in order to get the surgery.  She reports some worsening of historical vertigo symptoms after her recent sinus infection and cough.  She has not passed out, denies speech slurring, weakness.  She denies a feeling of lightheadedness. She does endorse some ringing in her ears and hearing changes that she associated with her recent sinus infection, where her head felt very full.  She reports that her symptoms are consistent with her prior episodes of vertigo, and would like to see PT for similar rehab as she found benefit from it in the past.  Ms. Coppinger also expressed concerns that she feels like she has cancer somewhere in her body, as she lost both her brother and husband to cancer, and there is hx of various forms of cancers in her family. Discussed mammography and colonoscopy, and would revisit this concern at her future visit given time restraints today.   PERTINENT  PMH / PSH: Fibromyalgia, right knee pain, BPPV, hypertension  OBJECTIVE:   BP (!) 168/83   Pulse 70   Ht 5' 1 (1.549 m)   Wt 236 lb 4 oz (107.2 kg)   SpO2 100%   BMI  44.64 kg/m   General: Awake, alert, NAD. Communicates clearly. Cardio: RRR. Normal S1, S2. No murmur, rub, gallop. 2+ radial and dorsalis pedis pulses b/l w/ good capillary refill. No LE edema.  Resp: CTA bilaterally. No wheezes, rales, or rhonchi. Normal work of breathing on room air.  MSK: R knee w/ diffuse TTP, most exquisitely over the quad tendon attachment and medial joint line. She has no laxity w/ special testing but most maneuvers cause her discomfort, w/ the mcmurray most notably so. She reports feelings of burning  Skin: No rash or lesions appreciated. No abnormal nevi. Vertical TKA scar over her L knee.   ASSESSMENT/PLAN:   Assessment & Plan Chronic pain of right knee Pt still reporting 10/10 R knee pain when off of mobic , still walking with cane.  Patient has had good response to TKA on L knee, would like to proceed towards R TKA.  Mobic  15mg  daily Referred for trial of PT Referred to orthopedic surgeon for initial consult regarding potential knee replacement.  Acute vestibular syndrome Historical vertigo symptoms worsened by her recent sinus infection and coughing.  Symptoms consistent w/ BPPV, which was responsive to prior vestibular rehab w/ PT.  Referred to neurorehab PT Primary hypertension Ischemic vascular disease Patient adherent to coreg  + hyzaar, BP elevated today. Further discussion deferred to f/u visit 2/2 time limitations.  CBC and  BMP in the office today iso further NSAID use.  Screening for colon cancer Referred to GI for colon cancer screening Encounter for screening mammogram for malignant neoplasm of breast Pt due for screening mammo, referral sent    Leafy Scriver, DO [facility]

## 2024-09-08 NOTE — Assessment & Plan Note (Signed)
 Patient adherent to coreg  + hyzaar, BP elevated today. Further discussion deferred to f/u visit 2/2 time limitations.  CBC and BMP in the office today iso further NSAID use.

## 2024-09-08 NOTE — Assessment & Plan Note (Signed)
 Historical vertigo symptoms worsened by her recent sinus infection and coughing.  Symptoms consistent w/ BPPV, which was responsive to prior vestibular rehab w/ PT.  Referred to neurorehab PT

## 2024-09-09 LAB — BASIC METABOLIC PANEL WITH GFR
BUN/Creatinine Ratio: 25 (ref 12–28)
BUN: 27 mg/dL (ref 8–27)
CO2: 21 mmol/L (ref 20–29)
Calcium: 9.1 mg/dL (ref 8.7–10.3)
Chloride: 104 mmol/L (ref 96–106)
Creatinine, Ser: 1.06 mg/dL — ABNORMAL HIGH (ref 0.57–1.00)
Glucose: 76 mg/dL (ref 70–99)
Potassium: 3.6 mmol/L (ref 3.5–5.2)
Sodium: 145 mmol/L — ABNORMAL HIGH (ref 134–144)
eGFR: 60 mL/min/1.73 (ref >59)

## 2024-09-09 LAB — CBC WITH DIFFERENTIAL/PLATELET
Basophils Absolute: 0.1 x10E3/uL (ref 0.0–0.2)
Basos: 1 %
EOS (ABSOLUTE): 0.3 x10E3/uL (ref 0.0–0.4)
Eos: 3 %
Hematocrit: 46.2 % (ref 34.0–46.6)
Hemoglobin: 15.2 g/dL (ref 11.1–15.9)
Immature Grans (Abs): 0 x10E3/uL (ref 0.0–0.1)
Immature Granulocytes: 0 %
Lymphocytes Absolute: 3.5 x10E3/uL — ABNORMAL HIGH (ref 0.7–3.1)
Lymphs: 32 %
MCH: 30 pg (ref 26.6–33.0)
MCHC: 32.9 g/dL (ref 31.5–35.7)
MCV: 91 fL (ref 79–97)
Monocytes Absolute: 0.9 x10E3/uL (ref 0.1–0.9)
Monocytes: 8 %
Neutrophils Absolute: 6.2 x10E3/uL (ref 1.4–7.0)
Neutrophils: 56 %
Platelets: 238 x10E3/uL (ref 150–450)
RBC: 5.07 x10E6/uL (ref 3.77–5.28)
RDW: 13.8 % (ref 11.7–15.4)
WBC: 11 x10E3/uL — ABNORMAL HIGH (ref 3.4–10.8)

## 2024-09-11 ENCOUNTER — Ambulatory Visit: Payer: Self-pay

## 2024-09-13 ENCOUNTER — Encounter: Payer: Self-pay | Admitting: Gastroenterology

## 2024-09-21 ENCOUNTER — Ambulatory Visit: Payer: Self-pay

## 2024-09-21 VITALS — BP 134/83 | HR 72 | Ht 61.0 in | Wt 252.1 lb

## 2024-09-21 DIAGNOSIS — H8190 Unspecified disorder of vestibular function, unspecified ear: Secondary | ICD-10-CM | POA: Diagnosis not present

## 2024-09-21 DIAGNOSIS — R232 Flushing: Secondary | ICD-10-CM | POA: Diagnosis not present

## 2024-09-21 DIAGNOSIS — M7989 Other specified soft tissue disorders: Secondary | ICD-10-CM | POA: Diagnosis not present

## 2024-09-21 DIAGNOSIS — N6452 Nipple discharge: Secondary | ICD-10-CM

## 2024-09-21 DIAGNOSIS — N644 Mastodynia: Secondary | ICD-10-CM | POA: Diagnosis not present

## 2024-09-21 MED ORDER — MECLIZINE HCL 12.5 MG PO TABS: 12.5000 mg | ORAL_TABLET | Freq: Two times a day (BID) | ORAL | Status: AC

## 2024-09-21 NOTE — Assessment & Plan Note (Signed)
 Meclizine  12.5 twice daily for vertigo symptoms Following up with physical therapy for management of suspected BPPV

## 2024-09-21 NOTE — Patient Instructions (Signed)
 It was great to see you today!  Today we talked about the lumps on your hip and armpit, your hot flashes, and some of the breast symptoms you've been having. I am placing an order for an ultrasound of the lump on your hip, you will get a lab test today for prolactin (a hormone that could contribute to nipple discharge), and I will order meclizine  to help with your vertigo. I will see you back in 3-4 weeks and I will call you with your mammogram results as soon as I get them. You are doing a great job managing all of these appointments and I'm hopeful that we'll get some good news soon!   Please schedule your follow up visit at the front desk before you leave today.   Thank you for choosing [facility].   Please call [phone] with any questions about today's appointment.  Leafy Scriver, DO Family Medicine

## 2024-09-21 NOTE — Progress Notes (Signed)
    SUBJECTIVE:   CHIEF COMPLAINT / HPI:   Ms Mousseau present today to discuss her concerns about possible cancers.   She reports she has been having feelings of hot flashes 2-3 times a day for the last 6-8 months.  These episodes come on irregularly and without noticeable pattern.  She reports that they make her feel tired, sweaty, like they take her breath away.  She reports an itchy, patchy rash that has developed over her chest and lateral breast area bilaterally that has been coming and going for the last few months.  She has been putting cocoa butter on it otherwise no topical treatments.  She reports a couple of episodes of nipple discharge, with clear fluid from the right and cloudy fluid from the left noted.  She also reports small nodules in her left armpit and lateral breast region that she is concerned may be getting larger.  They have been present for over a month.  She is very concerned that this constellation of symptoms represents that she has breast cancer, which her best friend has had and encouraged her to get checked out.  She reports having a subcutaneous lump superior to her left hip, which she reports is similar to 1 that her husband had which was revealed to be metastatic cancer before he died.  Understandably, she is very distressed over the presence of this nodule and would like to get it worked up.  She reports it is tender to palpation and thinks it has been getting bigger.  She also reports that she has had continued vertigo.  She has an upcoming appointment with neurorehab physical therapy, but would like something in the meantime to help her with the symptoms.  Discussed meclizine  which she has had a benefit from in the past.  She has coordinated upcoming appointments for colonoscopy, physical therapy, Ortho, and mammogram tomorrow.  OBJECTIVE:   BP 134/83   Pulse 72   Ht 5' 1 (1.549 m)   Wt 252 lb 2 oz (114.4 kg)   SpO2 100%   BMI 47.64 kg/m   General: Awake,  alert, NAD. Communicates clearly. MSK:  Tender, 1x1x1cm mobile nodule palpated in the region superior to her L iliac crest. No overlying skin change.   Breast: Multiple small, non tender subcutaneous densities palpated in the region of her L axilla and lateral breast region. No overlying skin changes.  One small, lightly pigmented papule present on R nipple.  Psych: Appropriate mood and affect. No SI/HI.     ASSESSMENT/PLAN:   Assessment & Plan Breast tenderness Nipple discharge Hot flashes not due to menopause Patient reporting daily hot flashes for multiple months, L nipple discharge, and tenderness.  Mammogram scheduled for 10/10 Prolactin lab in clinic today Acute vestibular syndrome Meclizine  12.5 twice daily for vertigo symptoms Following up with physical therapy for management of suspected BPPV Mass of soft tissue of left lower extremity Soft tissue nodule palpated superior to L hip, patient reports has been increasing in size for the last few months. -US  soft tissue non vascular of nodule   Patient verbalized understanding and agrees w/ plan.   RTC in 1 month  Leafy Scriver, DO [facility]

## 2024-09-22 ENCOUNTER — Ambulatory Visit
Admission: RE | Admit: 2024-09-22 | Discharge: 2024-09-22 | Disposition: A | Discharge status: Home / Self Care | Source: Ambulatory Visit | Attending: Family Medicine | Admitting: Family Medicine

## 2024-09-22 DIAGNOSIS — Z1231 Encounter for screening mammogram for malignant neoplasm of breast: Secondary | ICD-10-CM

## 2024-09-22 LAB — PROLACTIN: Prolactin: 16.2 ng/mL (ref 3.6–25.2)

## 2024-09-22 NOTE — Therapy (Signed)
 [facility] VESTIBULAR EVALUATION     Patient Name: Megan Dixon MRN: [id number] DOB:Nov 15, 1966, 58 y.o., female Today's Date: 09/25/2024  END OF SESSION:  PT End of Session - 09/25/24 9077     Visit Number 1    Number of Visits 5    Date for Recertification  10/25/24    Authorization Type [employer]    PT Start Time 0920    PT Stop Time 1005    PT Time Calculation (min) 45 min    Activity Tolerance --   limited by dizziness/emesis   Behavior During Therapy [facility] for tasks assessed/performed          Past Medical History:  Diagnosis Date   Arthritis    Depression    Fibromyalgia    Hypertension    Incarcerated incisional hernia 08/10/2017   Lupus    a. was told bloodwork was possibly positive for lupus in 08/2018 by PCP.   Morbid obesity (HCC)    MS (multiple sclerosis)    a. presumptive dx in [place], [place]   OCD (obsessive compulsive disorder)    OSA (obstructive sleep apnea)    a. dx recently, pending CPAP initiation.   Palpitations    Pneumonia 07/2018   left lung   Past Surgical History:  Procedure Laterality Date   ABDOMINAL SURGERY     BREAST EXCISIONAL BIOPSY Right    CARPAL TUNNEL RELEASE     HERNIA REPAIR     JOINT REPLACEMENT     VENTRAL HERNIA REPAIR N/A 08/09/2017   Procedure: Incarcerated ventral hernia repair with mesh ;  Surgeon: Ebbie Cough, MD;  Location: [facility] OR;  Service: General;  Laterality: N/A;   Patient Active Problem List   Diagnosis Date Noted   Arthritis of carpometacarpal [facility]) joint of left thumb 10/14/2023   Difficulty sleeping 10/14/2023   Abscess 08/10/2023   Breast lump 07/08/2023   Perineal pain in female 06/09/2021   OSA (obstructive sleep apnea)    History of CVA (cerebrovascular accident) 09/10/2020   Congestive heart failure (HCC) 08/08/2020   Hyperlipidemia 01/09/2020   Depression 01/01/2020   Peripheral neuropathy 11/23/2019   Prediabetes 11/23/2019   Acute vestibular  syndrome 08/29/2019   Ischemic vascular disease 08/29/2019   Migraine 08/29/2019   Morbid obesity (HCC)    Family history of systemic lupus erythematosus 09/10/2018   Hypertension 08/01/2018   Fibromyalgia 08/01/2018   Chronic low back pain 08/16/2017   Gastroesophageal reflux disease 08/16/2017   S/P exploratory laparotomy 08/09/2017    PCP: Donah Laymon PARAS, MD REFERRING PROVIDER: Donah Laymon PARAS, MD  REFERRING DIAG: H81.90 (ICD-10-CM) - Acute vestibular syndrome  THERAPY DIAG:  Dizziness and giddiness  Unsteadiness on feet  ONSET DATE: 09/08/2024  Rationale for Evaluation and Treatment: Rehabilitation  SUBJECTIVE:   SUBJECTIVE STATEMENT:  Reports was told to stop taking Meclizine . Has been taking Dramamine and that has been helping. Reports has had vertigo that comes and goes for 14 years. Reports more recent episode of dizziness has been going on for 2-3 weeks. Had recent sinus infection where she coughed and coughed and that triggered it. Standing up and sitting there will make her dizzy if she moves her eyes the wrong way. Laying down on her R side. Things will feel like they are spinning - lasts like 7-8 minutes and has to do some breathing and has to get a cold towel or washcloth. Feels like her eyes are jumping sometimes. Notes moving her eyes sometimes  will make her feel hot. Pt reports taking her BP medication, but it has been running high due to not feeling well the past few weeks.   Pt accompanied by: self  PERTINENT HISTORY: PMH: Depression, Fibromyalgia, HTN, Obesity, OCD   worsening of historical vertigo symptoms after her recent sinus infection and cough.   PAIN:  Are you having pain? Yes, pain at the back of her head, reports sometimes it will be a 10/10, on the R side of the base of her skull and sometimes will move to the front of her head   Vitals:   09/25/24 0938  BP: (!) 170/93  Pulse: 68     PRECAUTIONS: Fall   FALLS: Has patient  fallen in last 6 months? Yes. Number of falls 3, after trying to get out of bed when going to the restroom   LIVING ENVIRONMENT: Lives with: lives with daughter and grandson  Lives in: House/apartment Stairs: Yes: External: 2 in front, 3 in back steps; on right going up Has following equipment at home: Single point cane, but forgot it this morning   PLOF: Independent Not driving   PATIENT GOALS: Wants this dizziness to stop   OBJECTIVE:  Note: Objective measures were completed at Evaluation unless otherwise noted.  DIAGNOSTIC FINDINGS: No related imaging   COGNITION: Overall cognitive status: Within functional limits for tasks assessed   GAIT: Gait pattern: unsteadiness at end of session and decreased stride length Distance walked: Clinic distances  Assistive device utilized: None Level of assistance: Modified independence and CGA Comments: When ambulating into session, pt able to ambulate with no AD and mod I, at end of session pt needing CGA for balance and intermittent taps to the walls    VESTIBULAR ASSESSMENT:  GENERAL OBSERVATION: ambulates in with no AD, no unsteadiness noted when ambulating into session , pt reports she normally uses a cane    SYMPTOM BEHAVIOR:  Subjective history: See above   Non-Vestibular symptoms: changes in vision, diplopia, neck pain, headaches, nausea/vomiting, and reports blurred and double vision R eye, seeing an eye doctor in 2 weeks regarding this   Type of dizziness: Spinning/Vertigo and very uncomfortable, scary   Frequency: not as severe as it was, but stays a little dizzy   Duration: when spinning happens, lasts 7-8 minutes   Aggravating factors: Induced by position change: lying supine, supine to sit, and laying on her R side and Induced by motion: turning head quickly  Relieving factors: sitting still   Progression of symptoms: better  OCULOMOTOR EXAM:  Ocular Alignment: normal  Ocular ROM: No Limitations  Spontaneous  Nystagmus: absent  Gaze-Induced Nystagmus: absent  Smooth Pursuits: intact and slow/jumpy on R side, very frequent blinking with R eye, pt reporting pain behind R eye and R side of head, felt nauseous afterwards   Saccades: intact, slow, and ~2 saccadic beats to the L, mild dizziness  in horizontal direction, frequent blinking with R eye, ~2 saccadic beats from midline > superior direction, pt reports not as symptomatic as horizontal direction    VESTIBULAR - OCULAR REFLEX:   Slow VOR: Normal, moderate > severe dizziness, blinking with R eye, pt reports double and blurred vision   VOR Cancellation: Pt able to keep eyes fixated on therapist's, frequent blinking with R eye, pt with an episode of emesis afterwards due to severe dizziness   Vestibular assessment terminated after severe dizziness with VOR cancellation and pt having an episode of emesis.  TREATMENT DATE: 09/25/24  Self-Care: Discussed that pt's sx are not consistent with BPPV (what she was referred to PT for) and that with pt with R sided headaches, R blurred vision/diplopia that has been going on for a few weeks that she has been sick, discussed that an MRI would be recommended. PT to reach out to referring PCP regarding these findings and about ordering an MRI  Pt's BP elevated today with pt reporting that she has been taking her BP medications and it has been more elevated due to pt's symptoms. Pt has a way to monitor it at home. Discussed continuing to check BP at home and if still elevated then to reach out to PCP  POC will try for 1x week for 4 weeks to further assess and to wait back to hear from PCP   PATIENT EDUCATION: Education details: See above  Person educated: Patient Education method: Explanation Education comprehension: verbalized understanding  HOME EXERCISE PROGRAM: Will provide at future  session if warranted   GOALS: Goals reviewed with patient? Yes  SHORT TERM GOALS: ALL STGS = LTGS   LONG TERM GOALS: Target date: 10/23/2024  Further vestibular testing to be assessed with goals written as appropriate.  Baseline:  Goal status: INITIAL    ASSESSMENT:  CLINICAL IMPRESSION: Patient is a 20- year old female referred to Neuro OPPT for acute vestibular syndrome.   Pt's PMH is significant for: Depression, Fibromyalgia, HTN, Obesity, OCD. The following deficits were present during the exam: very frequent blinking of R eye with saccades, smooth pursuits, and VOR testing. Pt also with incr dizziness with oculomotor and VOR testing. With VOR cancellation pt reporting severe dizziness and did have an episode of emesis afterwards. Pt reporting blurred vision and diplopia in R eye that started a few weeks ago after being sick. Pt going to see an eye doctor in a couple weeks. Discussed due to findings, will reach out to pt's PCP regarding obtaining an MRI due to R sided headache, blurred vision, and diplopia. Will pick pt up for a few visits to follow-up on response from PCP and to do further vestibular testing and determine if vestibular PT would be appropriate for pt at this time.  OBJECTIVE IMPAIRMENTS: Abnormal gait, decreased activity tolerance, decreased balance, decreased knowledge of use of DME, decreased mobility, difficulty walking, dizziness, and pain.   ACTIVITY LIMITATIONS: carrying, lifting, bending, transfers, bed mobility, and locomotion level  PARTICIPATION LIMITATIONS: cleaning, laundry, and community activity  PERSONAL FACTORS: Behavior pattern, Past/current experiences, Time since onset of injury/illness/exacerbation, and 3+ comorbidities: Depression, Fibromyalgia, HTN, Obesity, OCD  are also affecting patient's functional outcome.   REHAB POTENTIAL: Good  CLINICAL DECISION MAKING: Unstable/unpredictable  EVALUATION COMPLEXITY: High   PLAN:  PT FREQUENCY:  1x/week  PT DURATION: 4 weeks  PLANNED INTERVENTIONS: 97164- PT Re-evaluation, 97110-Therapeutic exercises, 97530- Therapeutic activity, [id number]- Neuromuscular re-education, 97535- Self Care, [id number]- Manual therapy, [phone]- Canalith repositioning, Patient/Family education, Balance training, Vestibular training, and DME instructions  PLAN FOR NEXT SESSION: did we hear back from PCP? Finish vestibular assessment as able with positional testing/MSQ    Sheffield LOISE Senate, PT, DPT 09/25/2024, 11:10 AM

## 2024-09-25 ENCOUNTER — Encounter: Payer: Self-pay | Admitting: Physical Therapy

## 2024-09-25 ENCOUNTER — Ambulatory Visit: Attending: Family Medicine | Admitting: Physical Therapy

## 2024-09-25 ENCOUNTER — Telehealth: Payer: Self-pay | Admitting: Physical Therapy

## 2024-09-25 VITALS — BP 170/93 | HR 68

## 2024-09-25 DIAGNOSIS — H8190 Unspecified disorder of vestibular function, unspecified ear: Secondary | ICD-10-CM | POA: Insufficient documentation

## 2024-09-25 DIAGNOSIS — R42 Dizziness and giddiness: Secondary | ICD-10-CM | POA: Insufficient documentation

## 2024-09-25 DIAGNOSIS — R2681 Unsteadiness on feet: Secondary | ICD-10-CM | POA: Diagnosis present

## 2024-09-25 NOTE — Telephone Encounter (Signed)
 Dr. Manon,  I have seen your patient Nakhia Levitan for a vestibular evaluation on 09/25/24. Pt reports she was sick for a week and did a lot of coughing and that triggered her vestibular symptoms. Pt reports that she has had blurred and double vision in her R eye for a few weeks after being sick and headaches have happened daily on the R side behind her eye and on the R side of her head. With vestibular assessment today, pt with very frequent blinking with R eye and had an episode of emesis after VOR testing. Wanted to let you know this as pt's symptoms are not consistent with BPPV and pt may benefit from further imaging like an MRI.  Let me know what you think. Thanks!  Sheffield Senate, PT, DPT 09/25/24 9:58 AM   [facility] [address] [place], [place]  [address] Phone:  [phone] Fax:  [phone]

## 2024-09-25 NOTE — Telephone Encounter (Signed)
 Dr. Manon,  (Think I might have accidentally closed the encounter on my first message)   I have seen your patient Megan Dixon for a vestibular evaluation on 09/25/24. Pt reports she was sick for a week and did a lot of coughing and that triggered her vestibular symptoms. Pt reports that she has had blurred and double vision in her R eye for a few weeks after being sick and headaches have happened daily on the R side behind her eye and on the R side of her head. With vestibular assessment today, pt with very frequent blinking with R eye and had an episode of emesis after VOR testing. Wanted to let you know this as pt's symptoms are not consistent with BPPV and pt would benefit from further imaging like an MRI.   Let me know what you think. Thanks!   Sheffield Senate, PT, DPT 09/25/24 11:08 AM   [facility] [address] [place], [place]  [address] Phone:  [phone] Fax:  [phone]

## 2024-09-26 ENCOUNTER — Ambulatory Visit: Admitting: Physician Assistant

## 2024-09-26 ENCOUNTER — Other Ambulatory Visit: Payer: Self-pay

## 2024-09-26 DIAGNOSIS — H532 Diplopia: Secondary | ICD-10-CM

## 2024-09-26 DIAGNOSIS — M7989 Other specified soft tissue disorders: Secondary | ICD-10-CM

## 2024-09-26 DIAGNOSIS — R42 Dizziness and giddiness: Secondary | ICD-10-CM

## 2024-09-27 ENCOUNTER — Other Ambulatory Visit: Payer: Self-pay

## 2024-09-27 ENCOUNTER — Ambulatory Visit
Admission: RE | Admit: 2024-09-27 | Discharge: 2024-09-27 | Disposition: A | Discharge status: Home / Self Care | Source: Ambulatory Visit | Attending: Family Medicine | Admitting: Family Medicine

## 2024-09-27 DIAGNOSIS — M7989 Other specified soft tissue disorders: Secondary | ICD-10-CM

## 2024-09-27 DIAGNOSIS — R1909 Other intra-abdominal and pelvic swelling, mass and lump: Secondary | ICD-10-CM | POA: Diagnosis not present

## 2024-09-27 DIAGNOSIS — R2232 Localized swelling, mass and lump, left upper limb: Secondary | ICD-10-CM

## 2024-09-27 DIAGNOSIS — N6022 Fibroadenosis of left breast: Secondary | ICD-10-CM

## 2024-09-27 NOTE — Addendum Note (Signed)
 Addended byBETHA BALE, Delane Wessinger D on: 09/27/2024 12:00 PM   Modules accepted: Orders

## 2024-09-28 ENCOUNTER — Ambulatory Visit: Payer: Self-pay

## 2024-09-28 MED ORDER — ONDANSETRON 4 MG PO TBDP: 4.0000 mg | ORAL_TABLET | Freq: Three times a day (TID) | ORAL | 0 refills | Status: DC | PRN

## 2024-10-03 ENCOUNTER — Ambulatory Visit (INDEPENDENT_AMBULATORY_CARE_PROVIDER_SITE_OTHER): Admitting: Physician Assistant

## 2024-10-03 ENCOUNTER — Telehealth: Payer: Self-pay | Admitting: Physical Therapy

## 2024-10-03 ENCOUNTER — Encounter: Admitting: Physical Therapy

## 2024-10-03 VITALS — Ht 61.0 in | Wt 249.0 lb

## 2024-10-03 DIAGNOSIS — M1711 Unilateral primary osteoarthritis, right knee: Secondary | ICD-10-CM

## 2024-10-03 MED ORDER — TRAMADOL HCL 50 MG PO TABS: 50.0000 mg | ORAL_TABLET | Freq: Two times a day (BID) | ORAL | 2 refills | Status: AC | PRN

## 2024-10-03 NOTE — Telephone Encounter (Signed)
 Called pt regarding no-show appt today. Pt reports that she thought that she needed to wait until her MRI had been obtained. Pt reports that her dizziness does not feel as bad as when she was here for her initial PT eval. Pt's PCP had ordered her brain MRI, but had not been scheduled. Discussed that pt can come to her appt next week to complete vestibular eval while waiting on MRI and pt to be here for next week's appt. Sheffield Senate, PT, DPT 10/03/24 11:27 AM   [facility] [address] [place], [place]  [address] Phone:  [phone] Fax:  [phone]

## 2024-10-03 NOTE — Progress Notes (Signed)
 Office Visit Note   Patient: Megan Dixon           Date of Birth: [birth year]           MRN: [id number] Visit Date: 10/03/2024              Requested by: Donah Laymon PARAS, MD [address] [place],  [place] [address] PCP: Manon Jester, DO   Assessment & Plan: Visit Diagnoses:  1. Unilateral primary osteoarthritis, right knee     Plan: Impression is right knee osteoarthritis.  Today, we discussed various treatment options to include cortisone injection versus viscosupplementation injection versus total knee arthroplasty.  Unfortunately, she has a BMI of 47 and will need to get to a weight of 210 pounds to attain a BMI of less than 40 in order to proceed with surgery.  She understands and agrees.  I sent in tramadol to help with pain in the meantime.  Follow-up with Dr. Jerri when she has attained this goal and is ready to proceed with surgery.  Call with concerns or questions.  Follow-Up Instructions: Return if symptoms worsen or fail to improve.   Orders:  No orders of the defined types were placed in this encounter.  Meds ordered this encounter  Medications   traMADol (ULTRAM) 50 MG tablet    Sig: Take 1-2 tablets (50-100 mg total) by mouth 2 (two) times daily as needed.    Dispense:  60 tablet    Refill:  2      Procedures: No procedures performed   Clinical Data: No additional findings.   Subjective: Chief Complaint  Patient presents with   Right Knee - Pain    HPI patient is a pleasant 58 year old female who comes in today with right knee pain.  Symptoms have been ongoing for years and have progressively worsened.  Her pain is to the entire right knee.  Symptoms are constant but worse with activity as well as sleeping.  She has been taking Tylenol  without relief.  She has undergone cortisone injection in the past without relief.  No previous gel injection.  Currently not interested in proceeding with any injections.  Of note, she is status post left  total knee replacement doing well.  Review of Systems as detailed in HPI.  All other reviewed and are negative.   Objective: Vital Signs: Ht 5' 1 (1.549 m)   Wt 249 lb (112.9 kg)   BMI 47.05 kg/m   Physical Exam well-developed well-nourished female in no acute distress.  Alert and oriented x 3.  Ortho Exam right knee exam: Small effusion.  Range of motion 0 to 95 degrees.  Medial and lateral joint tenderness.  Moderate patellofemoral crepitus.  She is neurovascular intact distally.  Specialty Comments:  EXAM: MRI LUMBAR SPINE WITHOUT CONTRAST   TECHNIQUE: Multiplanar, multisequence MR imaging of the lumbar spine was performed. No intravenous contrast was administered.   COMPARISON:  None.   FINDINGS: Segmentation:  5 lumbar type vertebral bodies assumed.   Alignment: Mild curvature convex to the left with the apex at L2-3. Somewhat exaggerated lumbar lordosis.   Vertebrae:  No fracture or primary bone lesion.   Conus medullaris and cauda equina: Conus extends to the L1-2 level. Conus and cauda equina appear normal.   Paraspinal and other soft tissues: Negative   Disc levels:   No abnormality at L1-2 or above.   L2-3: Mild desiccation and minimal bulging of the disc. Mild facet and ligamentous prominence. No compressive stenosis.  L3-4: Desiccation and mild bulging of the disc. Mild facet and ligamentous prominence. No compressive stenosis.   L4-5: Minimal bulging of the disc. Mild facet and ligamentous prominence. No compressive stenosis.   L5-S1: Normal appearance of the disc. Mild facet and ligamentous prominence. Facet degeneration worse on the left, which could contribute to low back pain. No compressive stenosis.   IMPRESSION: 1. No advanced finding in the lumbar region. Mild curvature convex to the left with the apex at L2-3. Somewhat exaggerated lumbar lordosis. 2. Mild degenerative disc disease and degenerative facet disease at L2-3, L3-4 and L4-5  with minimal disc bulges. No compressive stenosis of the canal or foramina. 3. 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     Electronically Signed   By: Oneil Officer M.D.   On: 08/09/2020 15:28  Imaging: No results found.   PMFS History: Patient Active Problem List   Diagnosis Date Noted   Arthritis of carpometacarpal [facility]) joint of left thumb 10/14/2023   Difficulty sleeping 10/14/2023   Abscess 08/10/2023   Breast lump 07/08/2023   Perineal pain in female 06/09/2021   OSA (obstructive sleep apnea)    History of CVA (cerebrovascular accident) 09/10/2020   Congestive heart failure (HCC) 08/08/2020   Hyperlipidemia 01/09/2020   Depression 01/01/2020   Peripheral neuropathy 11/23/2019   Prediabetes 11/23/2019   Acute vestibular syndrome 08/29/2019   Ischemic vascular disease 08/29/2019   Migraine 08/29/2019   Morbid obesity (HCC)    Family history of systemic lupus erythematosus 09/10/2018   Hypertension 08/01/2018   Fibromyalgia 08/01/2018   Chronic low back pain 08/16/2017   Gastroesophageal reflux disease 08/16/2017   S/P exploratory laparotomy 08/09/2017   Past Medical History:  Diagnosis Date   Arthritis    Depression    Fibromyalgia    Hypertension    Incarcerated incisional hernia 08/10/2017   Lupus    a. was told bloodwork was possibly positive for lupus in 08/2018 by PCP.   Morbid obesity (HCC)    MS (multiple sclerosis)    a. presumptive dx in [place], [place]   OCD (obsessive compulsive disorder)    OSA (obstructive sleep apnea)    a. dx recently, pending CPAP initiation.   Palpitations    Pneumonia 07/2018   left lung    Family History  Problem Relation Age of Onset   Cancer Mother        Leukemia   Lung cancer Father    Cancer Sister        Leukemia   Lupus Sister    Lung cancer Brother    Seizures Brother    Heart attack Brother        Happened during seizure    Past  Surgical History:  Procedure Laterality Date   ABDOMINAL SURGERY     BREAST EXCISIONAL BIOPSY Right    CARPAL TUNNEL RELEASE     HERNIA REPAIR     JOINT REPLACEMENT     VENTRAL HERNIA REPAIR N/A 08/09/2017   Procedure: Incarcerated ventral hernia repair with mesh ;  Surgeon: Ebbie Cough, MD;  Location: [facility] OR;  Service: General;  Laterality: N/A;   Social History   Occupational History   Not on file  Tobacco Use   Smoking status: Former    Types: Cigarettes    Passive exposure: Current   Smokeless tobacco: Never   Tobacco comments:    Smoked for 40 years, quit in 08/2018  Vaping Use   Vaping status: Never Used  Substance and Sexual Activity   Alcohol use: Not Currently   Drug use: Never   Sexual activity: Not Currently

## 2024-10-04 NOTE — Addendum Note (Signed)
 Addended by: KENETH BOAS D on: 10/04/2024 09:54 AM   Modules accepted: Orders

## 2024-10-10 ENCOUNTER — Ambulatory Visit: Admitting: Physical Therapy

## 2024-10-17 ENCOUNTER — Ambulatory Visit
Admission: RE | Admit: 2024-10-17 | Discharge: 2024-10-17 | Disposition: A | Discharge status: Home / Self Care | Source: Ambulatory Visit

## 2024-10-17 ENCOUNTER — Ambulatory Visit: Attending: Family Medicine | Admitting: Physical Therapy

## 2024-10-17 ENCOUNTER — Ambulatory Visit: Admission: RE | Admit: 2024-10-17 | Discharge: 2024-10-17 | Disposition: A | Source: Ambulatory Visit

## 2024-10-17 ENCOUNTER — Other Ambulatory Visit: Payer: Self-pay

## 2024-10-17 DIAGNOSIS — R2232 Localized swelling, mass and lump, left upper limb: Secondary | ICD-10-CM

## 2024-10-17 DIAGNOSIS — N63 Unspecified lump in unspecified breast: Secondary | ICD-10-CM

## 2024-10-17 DIAGNOSIS — N6022 Fibroadenosis of left breast: Secondary | ICD-10-CM

## 2024-10-23 ENCOUNTER — Ambulatory Visit

## 2024-10-24 ENCOUNTER — Ambulatory Visit: Admitting: Physical Therapy

## 2024-10-31 ENCOUNTER — Inpatient Hospital Stay: Admission: RE | Admit: 2024-10-31 | Source: Ambulatory Visit

## 2024-11-01 NOTE — Progress Notes (Deleted)
 Chief Complaint: Primary GI MD: Sampson  HPI:  *** is a  ***  who was referred to me by Donah Laymon PARAS, MD for a complaint of *** .     Discussed the use of AI scribe software for clinical note transcription with the patient, who gave verbal consent to proceed.  History of Present Illness      PREVIOUS GI WORKUP     Past Medical History:  Diagnosis Date   Arthritis    Depression    Fibromyalgia    Hypertension    Incarcerated incisional hernia 08/10/2017   Lupus    a. was told bloodwork was possibly positive for lupus in 08/2018 by PCP.   Morbid obesity (HCC)    MS (multiple sclerosis)    a. presumptive dx in [place], [place]   OCD (obsessive compulsive disorder)    OSA (obstructive sleep apnea)    a. dx recently, pending CPAP initiation.   Palpitations    Pneumonia 07/2018   left lung    Past Surgical History:  Procedure Laterality Date   ABDOMINAL SURGERY     BREAST EXCISIONAL BIOPSY Right    CARPAL TUNNEL RELEASE     HERNIA REPAIR     JOINT REPLACEMENT     VENTRAL HERNIA REPAIR N/A 08/09/2017   Procedure: Incarcerated ventral hernia repair with mesh ;  Surgeon: Ebbie Cough, MD;  Location: [facility] OR;  Service: General;  Laterality: N/A;    Current Outpatient Medications  Medication Sig Dispense Refill   albuterol  (VENTOLIN  HFA) 108 (90 Base) MCG/ACT inhaler Inhale 2 puffs into the lungs every 6 (six) hours as needed for wheezing or shortness of breath. 17 g 0   amoxicillin  (AMOXIL ) 500 MG capsule Take 2 capsules (1,000 mg total) by mouth 3 (three) times daily. 30 capsule 0   atorvastatin  (LIPITOR) 40 MG tablet Take 1 tablet (40 mg total) by mouth daily. 90 tablet 3   benzonatate  (TESSALON ) 100 MG capsule Take 1 capsule (100 mg total) by mouth 2 (two) times daily as needed for cough. 20 capsule 0   carvedilol  (COREG ) 12.5 MG tablet TAKE 1 TABLET (12.5MG  TOTAL) BY MOUTH TWICE A DAY WITH MEALS 180 tablet 1   clopidogrel  (PLAVIX ) 75 MG tablet TAKE  1 TABLET BY MOUTH EVERY DAY 90 tablet 3   Elastic Bandages & Supports (MEDICAL COMPRESSION STOCKINGS) MISC 1 Container by Does not apply route daily. Apply to both legs daily 2 each 2   FLUoxetine  (PROZAC ) 40 MG capsule Take 2 capsules (80 mg total) by mouth daily. 180 capsule 1   losartan -hydrochlorothiazide  (HYZAAR) 100-25 MG tablet Take 1 tablet by mouth daily. 90 tablet 3   ondansetron  (ZOFRAN -ODT) 4 MG disintegrating tablet Take 1 tablet (4 mg total) by mouth every 8 (eight) hours as needed for nausea. 10 tablet 0   oxyCODONE  (ROXICODONE ) 5 MG immediate release tablet Take 1 tablet (5 mg total) by mouth every 8 (eight) hours as needed for up to 5 doses for breakthrough pain. 5 tablet 0   pantoprazole  (PROTONIX ) 40 MG tablet Take 1 tablet (40 mg total) by mouth daily. 90 tablet 3   traMADol  (ULTRAM ) 50 MG tablet Take 1-2 tablets (50-100 mg total) by mouth 2 (two) times daily as needed. 60 tablet 2   Current Facility-Administered Medications  Medication Dose Route Frequency Provider Last Rate Last Admin   meclizine  (ANTIVERT ) tablet 12.5 mg  12.5 mg Oral BID         Allergies as  of 11/02/2024 - Review Complete 09/25/2024  Allergen Reaction Noted   Morphine  and codeine  Other (See Comments) 11/11/2015   Other Other (See Comments) 08/09/2017   Peanut-containing drug products Other (See Comments) 08/09/2017   Toradol  [ketorolac  tromethamine ] Anxiety 11/11/2015    Family History  Problem Relation Age of Onset   Cancer Mother        Leukemia   Lung cancer Father    Cancer Sister        Leukemia   Lupus Sister    Breast cancer Maternal Aunt    Lung cancer Brother    Seizures Brother    Heart attack Brother        Happened during seizure   Breast cancer Niece     Social History   Socioeconomic History   Marital status: Single    Spouse name: Not on file   Number of children: Not on file   Years of education: Not on file   Highest education level: Not on file  Occupational  History   Not on file  Tobacco Use   Smoking status: Former    Types: Cigarettes    Passive exposure: Current   Smokeless tobacco: Never   Tobacco comments:    Smoked for 40 years, quit in 08/2018  Vaping Use   Vaping status: Never Used  Substance and Sexual Activity   Alcohol use: Not Currently   Drug use: Never   Sexual activity: Not Currently  Other Topics Concern   Not on file  Social History Narrative   Not on file   Social Drivers of Health   Financial Resource Strain: Low Risk  (05/04/2024)   Overall Financial Resource Strain (CARDIA)    Difficulty of Paying Living Expenses: Not hard at all  Food Insecurity: No Food Insecurity (05/04/2024)   Hunger Vital Sign    Worried About Running Out of Food in the Last Year: Never true    Ran Out of Food in the Last Year: Never true  Transportation Needs: No Transportation Needs (05/04/2024)   PRAPARE - Administrator, Civil Service (Medical): No    Lack of Transportation (Non-Medical): No  Physical Activity: Sufficiently Active (05/04/2024)   Exercise Vital Sign    Days of Exercise per Week: 7 days    Minutes of Exercise per Session: 30 min  Stress: No Stress Concern Present (05/04/2024)   [employer] of Occupational Health - Occupational Stress Questionnaire    Feeling of Stress : Not at all  Social Connections: Moderately Isolated (05/04/2024)   Social Connection and Isolation Panel    Frequency of Communication with Friends and Family: More than three times a week    Frequency of Social Gatherings with Friends and Family: More than three times a week    Attends Religious Services: More than 4 times per year    Active Member of [employer] or Organizations: No    Attends Banker Meetings: Never    Marital Status: Widowed  Intimate Partner Violence: Not At Risk (05/04/2024)   Humiliation, Afraid, Rape, and Kick questionnaire    Fear of Current or Ex-Partner: No    Emotionally Abused: No    Physically  Abused: No    Sexually Abused: No    Review of Systems:    Constitutional: No weight loss, fever, chills, weakness or fatigue HEENT: Eyes: No change in vision               Ears, Nose, Throat:  No  change in hearing or congestion Skin: No rash or itching Cardiovascular: No chest pain, chest pressure or palpitations   Respiratory: No SOB or cough Gastrointestinal: See HPI and otherwise negative Genitourinary: No dysuria or change in urinary frequency Neurological: No headache, dizziness or syncope Musculoskeletal: No new muscle or joint pain Hematologic: No bleeding or bruising Psychiatric: No history of depression or anxiety    Physical Exam:  Vital signs: There were no vitals taken for this visit.  Constitutional: NAD, alert and cooperative Head:  Normocephalic and atraumatic. Eyes:   PEERL, EOMI. No icterus. Conjunctiva pink. Respiratory: Respirations even and unlabored. Lungs clear to auscultation bilaterally.   No wheezes, crackles, or rhonchi.  Cardiovascular:  Regular rate and rhythm. No peripheral edema, cyanosis or pallor.  Gastrointestinal:  Soft, nondistended, nontender. No rebound or guarding. Normal bowel sounds. No appreciable masses or hepatomegaly. Rectal:  Declines Msk:  Symmetrical without gross deformities. Without edema, no deformity or joint abnormality.  Neurologic:  Alert and  oriented x4;  grossly normal neurologically.  Skin:   Dry and intact without significant lesions or rashes. Psychiatric: Oriented to person, place and time. Demonstrates good judgement and reason without abnormal affect or behaviors.  Physical Exam    RELEVANT LABS AND IMAGING: CBC    Component Value Date/Time   WBC 11.0 (H) 09/08/2024 0957   WBC 10.4 12/29/2023 1203   RBC 5.07 09/08/2024 0957   RBC 5.29 (H) 12/29/2023 1203   HGB 15.2 09/08/2024 0957   HCT 46.2 09/08/2024 0957   PLT 238 09/08/2024 0957   MCV 91 09/08/2024 0957   MCH 30.0 09/08/2024 0957   MCH 29.1  12/29/2023 1203   MCHC 32.9 09/08/2024 0957   MCHC 32.8 12/29/2023 1203   RDW 13.8 09/08/2024 0957   LYMPHSABS 3.5 (H) 09/08/2024 0957   MONOABS 0.8 12/12/2022 0510   EOSABS 0.3 09/08/2024 0957   BASOSABS 0.1 09/08/2024 0957    CMP     Component Value Date/Time   NA 145 (H) 09/08/2024 0957   K 3.6 09/08/2024 0957   CL 104 09/08/2024 0957   CO2 21 09/08/2024 0957   GLUCOSE 76 09/08/2024 0957   GLUCOSE 106 (H) 12/29/2023 1203   BUN 27 09/08/2024 0957   CREATININE 1.06 (H) 09/08/2024 0957   CALCIUM  9.1 09/08/2024 0957   PROT 7.3 12/29/2023 1203   PROT 7.2 05/01/2022 1523   ALBUMIN 3.6 12/29/2023 1203   ALBUMIN 4.1 05/01/2022 1523   AST 17 12/29/2023 1203   ALT 17 12/29/2023 1203   ALKPHOS 82 12/29/2023 1203   BILITOT 0.7 12/29/2023 1203   BILITOT <0.2 05/01/2022 1523   GFRNONAA >60 12/29/2023 1203   GFRAA 112 08/06/2020 1518     Assessment/Plan:     Palpitations Negative workup per cardiology  Chronic diastolic heart failure Echocardiogram 07/2023 with EF 60 to 65%  Obesity BMI 51  OSA On CPAP  History of CVA On clopidogrel     Nestor Mollie RIGGERS [facility] 11/01/2024, 10:02 AM  Cc: Donah Laymon PARAS, MD

## 2024-11-02 ENCOUNTER — Ambulatory Visit: Admitting: Gastroenterology

## 2024-11-30 ENCOUNTER — Telehealth: Payer: Self-pay

## 2024-11-30 MED ORDER — ONDANSETRON 4 MG PO TBDP: 4.0000 mg | ORAL_TABLET | Freq: Three times a day (TID) | ORAL | 0 refills | Status: DC | PRN

## 2024-11-30 NOTE — Telephone Encounter (Signed)
 Patient calls nurse line requesting a refill on Meclizine  and Zofran .   She reports vertigo symptoms returned yesterday. She reports both medications have helped in the past.   Advised she may need to be see for additional refills, however will forward to PCP.

## 2024-11-30 NOTE — Telephone Encounter (Signed)
 Called patient per Dr. Merced request to inform patient that he have sent in refills for Zofran  and Meclizine , but need to call to reschedule for her Brain MRI to elevate her vertigo, and to also schedule an appointment to be seen by him in clinic. Was willing to assist patient with scheduling appointment to be seen in clinic by Dr. Manon. Patient declined and stated that she will call to reschedule her MRI, and to schedule an appointment tomorrow due to her leaving home for the evening.  Harlene Reiter, CMA

## 2024-12-25 ENCOUNTER — Encounter: Payer: Self-pay | Admitting: Pharmacist

## 2024-12-25 NOTE — Progress Notes (Signed)
 This patient is appearing on a report for being at risk of failing the adherence measure for hypertension (ACEi/ARB) medications this calendar year.   Medication: losartan /hydrochlorothiazide   Last fill date: 10/16/24 for 90 day supply  Reviewed medication indication, dosing, and goals of therapy.

## 2025-01-17 ENCOUNTER — Other Ambulatory Visit: Payer: Self-pay

## 2025-01-17 DIAGNOSIS — I10 Essential (primary) hypertension: Secondary | ICD-10-CM

## 2025-01-18 ENCOUNTER — Other Ambulatory Visit: Payer: Self-pay

## 2025-01-18 DIAGNOSIS — I10 Essential (primary) hypertension: Secondary | ICD-10-CM

## 2025-01-18 DIAGNOSIS — K219 Gastro-esophageal reflux disease without esophagitis: Secondary | ICD-10-CM

## 2025-01-18 MED ORDER — ONDANSETRON 4 MG PO TBDP: 4.0000 mg | ORAL_TABLET | Freq: Three times a day (TID) | ORAL | 0 refills | Status: AC | PRN

## 2025-01-18 MED ORDER — PANTOPRAZOLE SODIUM 40 MG PO TBEC: 40.0000 mg | DELAYED_RELEASE_TABLET | Freq: Every day | ORAL | 3 refills | Status: AC

## 2025-01-18 MED ORDER — LOSARTAN POTASSIUM-HCTZ 100-25 MG PO TABS: 1.0000 | ORAL_TABLET | Freq: Every day | ORAL | 3 refills | Status: AC

## 2025-01-23 ENCOUNTER — Ambulatory Visit: Payer: Self-pay | Admitting: Family Medicine

## 2025-05-14 ENCOUNTER — Encounter
